# Patient Record
Sex: Male | Born: 1945 | Race: White | Hispanic: No | Marital: Married | Smoking: Former smoker
Health system: Southern US, Community
[De-identification: ages and names within clinical notes are randomized; demographics above are authoritative.]

## PROBLEM LIST (undated history)

## (undated) DIAGNOSIS — I350 Nonrheumatic aortic (valve) stenosis: Secondary | ICD-10-CM

## (undated) DIAGNOSIS — N400 Enlarged prostate without lower urinary tract symptoms: Secondary | ICD-10-CM

## (undated) DIAGNOSIS — C439 Malignant melanoma of skin, unspecified: Secondary | ICD-10-CM

## (undated) DIAGNOSIS — T7840XA Allergy, unspecified, initial encounter: Secondary | ICD-10-CM

## (undated) DIAGNOSIS — C61 Malignant neoplasm of prostate: Secondary | ICD-10-CM

## (undated) DIAGNOSIS — R011 Cardiac murmur, unspecified: Secondary | ICD-10-CM

## (undated) DIAGNOSIS — K5790 Diverticulosis of intestine, part unspecified, without perforation or abscess without bleeding: Secondary | ICD-10-CM

## (undated) DIAGNOSIS — E785 Hyperlipidemia, unspecified: Secondary | ICD-10-CM

## (undated) DIAGNOSIS — L400 Psoriasis vulgaris: Secondary | ICD-10-CM

## (undated) DIAGNOSIS — C3490 Malignant neoplasm of unspecified part of unspecified bronchus or lung: Secondary | ICD-10-CM

## (undated) DIAGNOSIS — C4491 Basal cell carcinoma of skin, unspecified: Secondary | ICD-10-CM

## (undated) DIAGNOSIS — I779 Disorder of arteries and arterioles, unspecified: Secondary | ICD-10-CM

## (undated) DIAGNOSIS — I10 Essential (primary) hypertension: Secondary | ICD-10-CM

## (undated) HISTORY — DX: Nonrheumatic aortic (valve) stenosis: I35.0

## (undated) HISTORY — DX: Basal cell carcinoma of skin, unspecified: C44.91

## (undated) HISTORY — DX: Diverticulosis of intestine, part unspecified, without perforation or abscess without bleeding: K57.90

## (undated) HISTORY — DX: Allergy, unspecified, initial encounter: T78.40XA

## (undated) HISTORY — DX: Hyperlipidemia, unspecified: E78.5

## (undated) HISTORY — DX: Cardiac murmur, unspecified: R01.1

## (undated) HISTORY — DX: Malignant neoplasm of unspecified part of unspecified bronchus or lung: C34.90

## (undated) HISTORY — DX: Essential (primary) hypertension: I10

## (undated) HISTORY — DX: Malignant melanoma of skin, unspecified: C43.9

## (undated) HISTORY — PX: TONSILLECTOMY AND ADENOIDECTOMY: SUR1326

## (undated) HISTORY — DX: Disorder of arteries and arterioles, unspecified: I77.9

## (undated) HISTORY — DX: Malignant neoplasm of prostate: C61

## (undated) HISTORY — PX: CYSTOSCOPY WITH INSERTION OF UROLIFT: SHX6678

---

## 1987-01-12 DIAGNOSIS — T7840XA Allergy, unspecified, initial encounter: Secondary | ICD-10-CM

## 1987-01-12 DIAGNOSIS — C3490 Malignant neoplasm of unspecified part of unspecified bronchus or lung: Secondary | ICD-10-CM

## 1987-01-12 DIAGNOSIS — E785 Hyperlipidemia, unspecified: Secondary | ICD-10-CM

## 1987-01-12 HISTORY — DX: Hyperlipidemia, unspecified: E78.5

## 1987-01-12 HISTORY — PX: OTHER SURGICAL HISTORY: SHX169

## 1987-01-12 HISTORY — DX: Malignant neoplasm of unspecified part of unspecified bronchus or lung: C34.90

## 1987-01-12 HISTORY — DX: Allergy, unspecified, initial encounter: T78.40XA

## 1995-05-18 ENCOUNTER — Encounter: Payer: Self-pay | Admitting: Family Medicine

## 1995-05-18 LAB — CONVERTED CEMR LAB
RBC count: 4.4 10*6/uL
WBC, blood: 3.7 10*3/uL

## 1996-05-29 ENCOUNTER — Encounter: Payer: Self-pay | Admitting: Family Medicine

## 1996-05-29 LAB — CONVERTED CEMR LAB
RBC count: 4.29 10*6/uL
WBC, blood: 5.3 10*3/uL

## 1998-03-20 ENCOUNTER — Encounter: Payer: Self-pay | Admitting: Family Medicine

## 1998-03-20 LAB — CONVERTED CEMR LAB
Blood Glucose, Fasting: 84 mg/dL
PSA: 1.5 ng/mL
RBC count: 4.3 10*6/uL
TSH: 3.2 microintl units/mL
WBC, blood: 4.9 10*3/uL

## 1999-11-13 ENCOUNTER — Encounter: Payer: Self-pay | Admitting: Family Medicine

## 1999-11-13 LAB — CONVERTED CEMR LAB
Blood Glucose, Fasting: 96 mg/dL
PSA: 1 ng/mL
RBC count: 4.26 10*6/uL
TSH: 1.23 microintl units/mL
WBC, blood: 4.4 10*3/uL

## 2000-02-12 DIAGNOSIS — K5790 Diverticulosis of intestine, part unspecified, without perforation or abscess without bleeding: Secondary | ICD-10-CM

## 2000-02-12 HISTORY — DX: Diverticulosis of intestine, part unspecified, without perforation or abscess without bleeding: K57.90

## 2000-02-18 ENCOUNTER — Encounter (INDEPENDENT_AMBULATORY_CARE_PROVIDER_SITE_OTHER): Payer: Self-pay | Admitting: Specialist

## 2000-02-18 ENCOUNTER — Other Ambulatory Visit: Admission: RE | Admit: 2000-02-18 | Discharge: 2000-02-18 | Payer: Self-pay | Admitting: Gastroenterology

## 2000-02-18 HISTORY — PX: COLONOSCOPY: SHX174

## 2002-06-08 ENCOUNTER — Encounter: Payer: Self-pay | Admitting: Family Medicine

## 2002-06-08 LAB — CONVERTED CEMR LAB
Blood Glucose, Fasting: 98 mg/dL
PSA: 3.7 ng/mL
RBC count: 4.31 10*6/uL
TSH: 1.11 microintl units/mL
WBC, blood: 4.8 10*3/uL

## 2002-06-12 LAB — FECAL OCCULT BLOOD, GUAIAC: Fecal Occult Blood: NEGATIVE

## 2003-06-20 HISTORY — PX: OTHER SURGICAL HISTORY: SHX169

## 2004-02-13 ENCOUNTER — Ambulatory Visit: Payer: Self-pay | Admitting: Family Medicine

## 2004-02-13 LAB — CONVERTED CEMR LAB
Blood Glucose, Fasting: 108 mg/dL
PSA: 1.98 ng/mL
RBC count: 4.34 10*6/uL
TSH: 1.59 microintl units/mL
WBC, blood: 5.2 10*3/uL

## 2004-02-19 ENCOUNTER — Ambulatory Visit: Payer: Self-pay | Admitting: Family Medicine

## 2005-02-05 ENCOUNTER — Ambulatory Visit: Payer: Self-pay | Admitting: Family Medicine

## 2005-09-21 ENCOUNTER — Ambulatory Visit: Payer: Self-pay | Admitting: Family Medicine

## 2005-10-09 HISTORY — PX: MRI: SHX5353

## 2005-11-03 ENCOUNTER — Ambulatory Visit (HOSPITAL_COMMUNITY): Admission: RE | Admit: 2005-11-03 | Discharge: 2005-11-04 | Payer: Self-pay | Admitting: Neurosurgery

## 2005-11-03 HISTORY — PX: OTHER SURGICAL HISTORY: SHX169

## 2006-02-07 ENCOUNTER — Ambulatory Visit: Payer: Self-pay | Admitting: Family Medicine

## 2006-02-07 LAB — CONVERTED CEMR LAB
ALT: 23 units/L (ref 0–40)
AST: 29 units/L (ref 0–37)
Albumin: 3.9 g/dL (ref 3.5–5.2)
Alkaline Phosphatase: 45 units/L (ref 39–117)
BUN: 11 mg/dL (ref 6–23)
Blood Glucose, Fasting: 106 mg/dL
CO2: 29 meq/L (ref 19–32)
Calcium: 9.3 mg/dL (ref 8.4–10.5)
Chloride: 104 meq/L (ref 96–112)
Cholesterol: 203 mg/dL (ref 0–200)
Creatinine, Ser: 0.9 mg/dL (ref 0.4–1.5)
Creatinine,U: 89.7 mg/dL
Direct LDL: 120.1 mg/dL
GFR calc Af Amer: 111 mL/min
GFR calc non Af Amer: 91 mL/min
Glucose, Bld: 106 mg/dL — ABNORMAL HIGH (ref 70–99)
HCT: 40.6 % (ref 39.0–52.0)
HDL: 54.5 mg/dL (ref >39.0)
Hemoglobin: 13.9 g/dL (ref 13.0–17.0)
MCHC: 34.2 g/dL (ref 30.0–36.0)
MCV: 95.6 fL (ref 78.0–100.0)
Microalb Creat Ratio: 15.6 mg/g (ref 0.0–30.0)
Microalb, Ur: 1.4 mg/dL (ref 0.0–1.9)
PSA: 2.13 ng/mL (ref 0.10–4.00)
Platelets: 276 10*3/uL (ref 150–400)
Potassium: 4 meq/L (ref 3.5–5.1)
RBC count: 4.24 10*6/uL
RBC: 4.24 M/uL (ref 4.22–5.81)
RDW: 12.8 % (ref 11.5–14.6)
Sodium: 139 meq/L (ref 135–145)
TSH: 1.64 microintl units/mL
TSH: 1.64 microintl units/mL (ref 0.35–5.50)
Total Bilirubin: 0.9 mg/dL (ref 0.3–1.2)
Total CHOL/HDL Ratio: 3.7
Total Protein: 6.7 g/dL (ref 6.0–8.3)
Triglycerides: 116 mg/dL (ref 0–149)
VLDL: 23 mg/dL (ref 0–40)
WBC, blood: 4.7 10*3/uL
WBC: 4.7 10*3/uL (ref 4.5–10.5)

## 2006-02-09 ENCOUNTER — Ambulatory Visit: Payer: Self-pay | Admitting: Family Medicine

## 2006-02-09 LAB — CONVERTED CEMR LAB: PSA: 2.13 ng/mL

## 2006-02-18 ENCOUNTER — Ambulatory Visit: Payer: Self-pay | Admitting: Gastroenterology

## 2006-03-03 ENCOUNTER — Ambulatory Visit: Payer: Self-pay | Admitting: Gastroenterology

## 2006-03-03 HISTORY — PX: COLONOSCOPY: SHX174

## 2006-03-03 LAB — HM COLONOSCOPY

## 2006-07-04 ENCOUNTER — Emergency Department: Payer: Self-pay | Admitting: General Practice

## 2007-03-03 IMAGING — CR DG LUMBAR SPINE 2-3V
1 series · 1 of 1 positions shown · non-contrast
Comparison: none

CLINICAL DATA: HNP, radiculopathy.  
 LUMBAR SPINE - 2 VIEW:
 One view of the lumbar spine shows a surgical probe posterior to the L5 disc space directed towards the L5 spinous process.

[view not recorded]
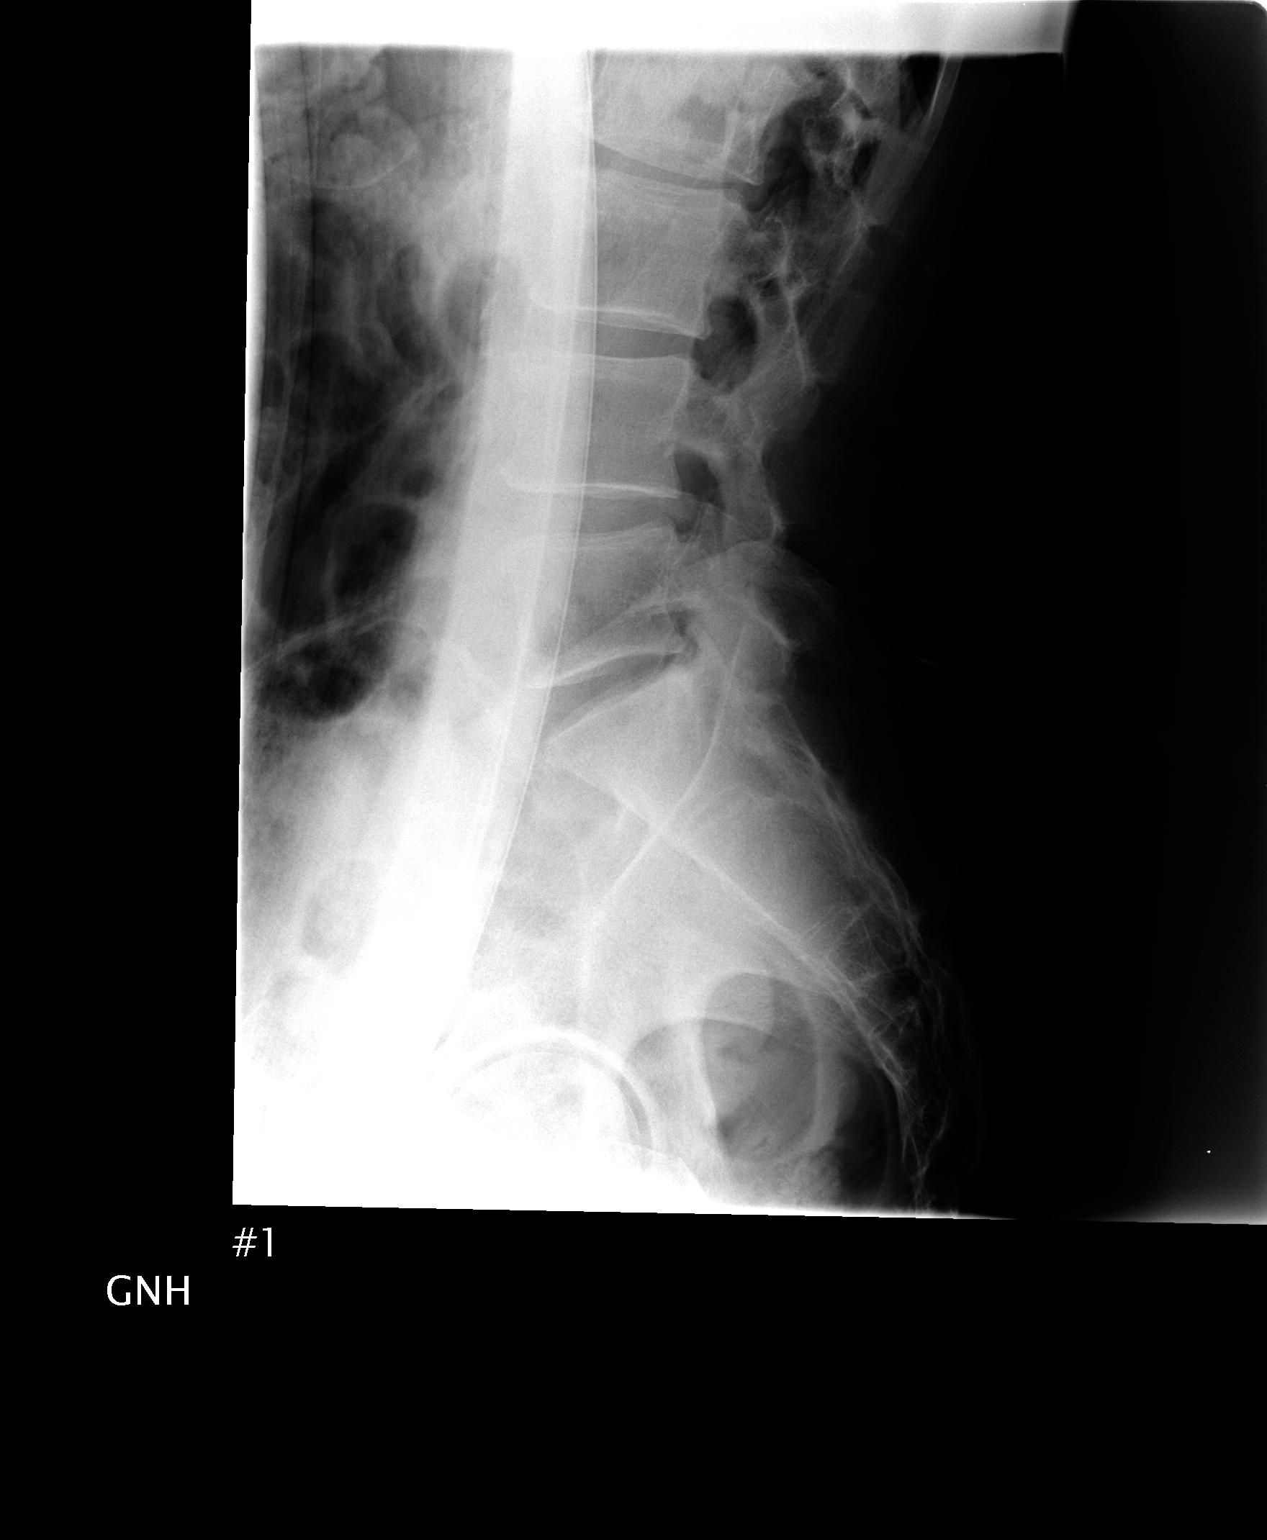

[1 of 1 positions shown; findings below may reference images not displayed]

IMPRESSION: Single view of lumbar spine obtained for lumbar disc space localization.

## 2007-03-03 IMAGING — CR DG CHEST 2V
2 series · 2 of 2 positions shown · non-contrast
Comparison: None.

CLINICAL DATA: Preop for OR.  Right upper lobectomy.
 CHEST - 2 VIEW:

[view not recorded (1 of 2)]
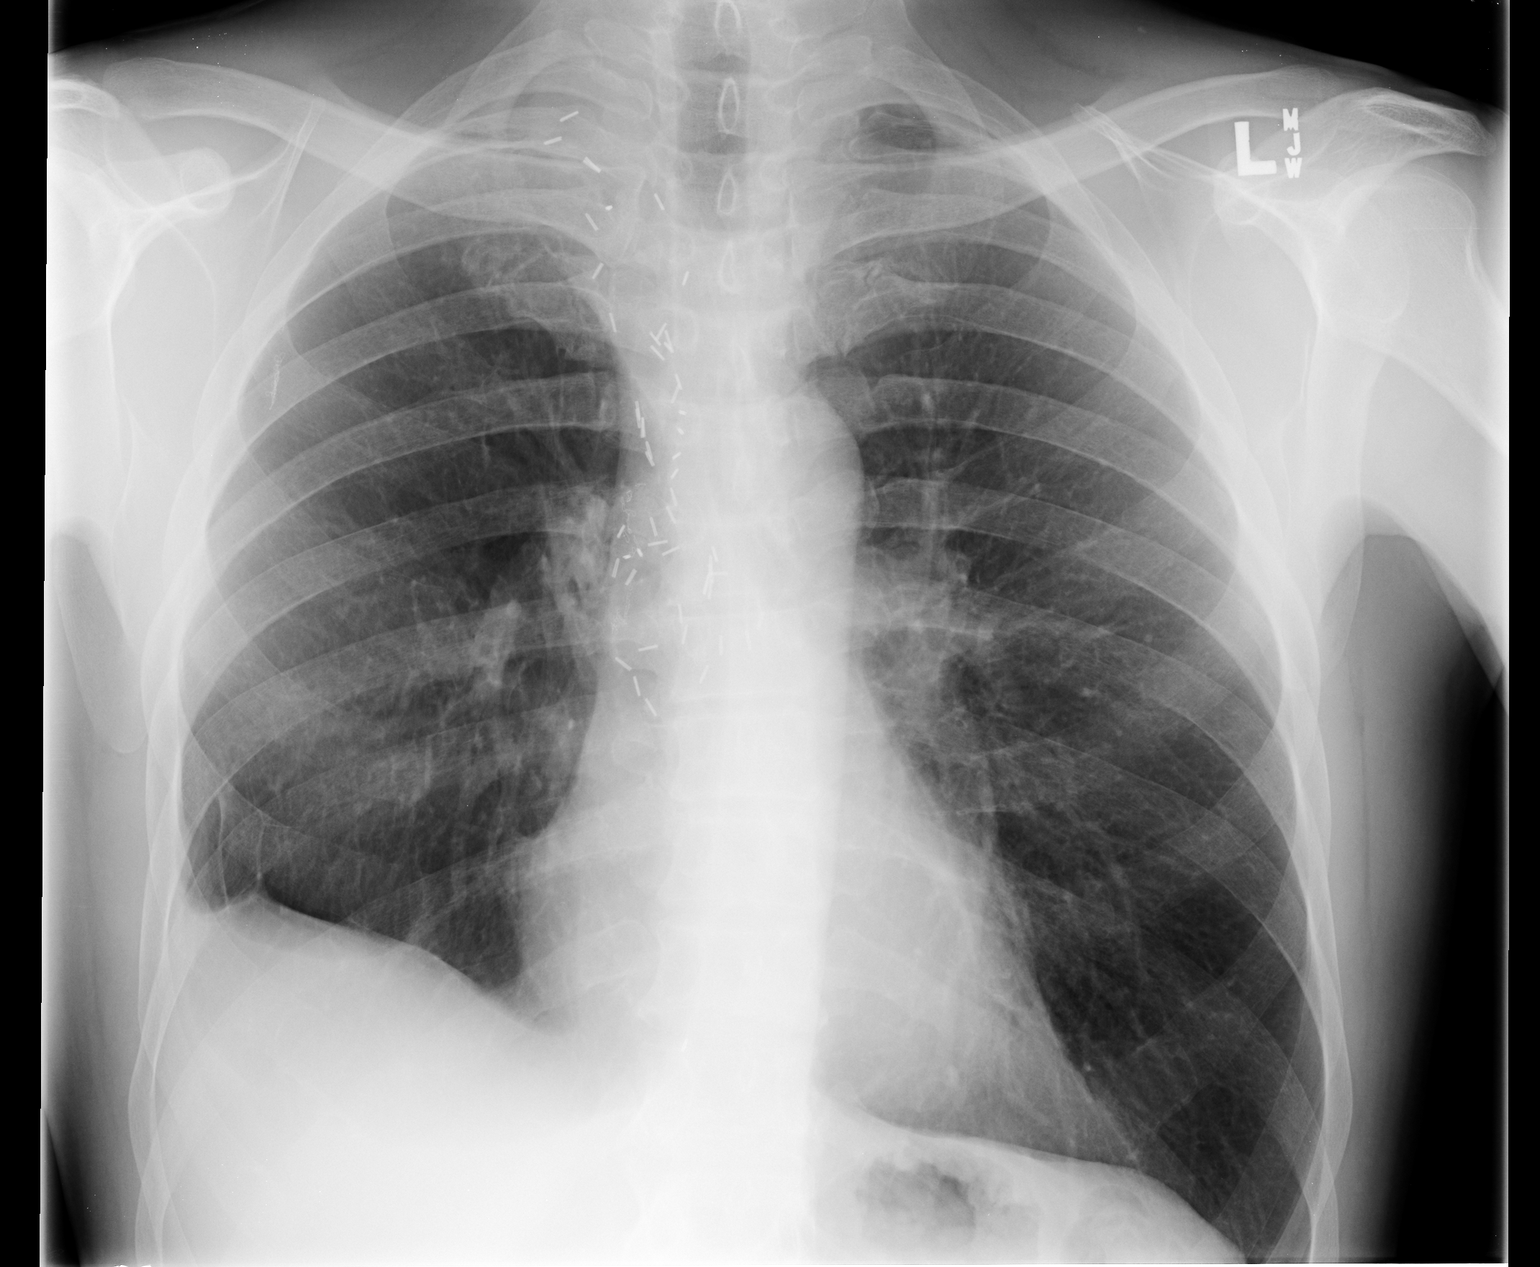

[view not recorded (2 of 2)]
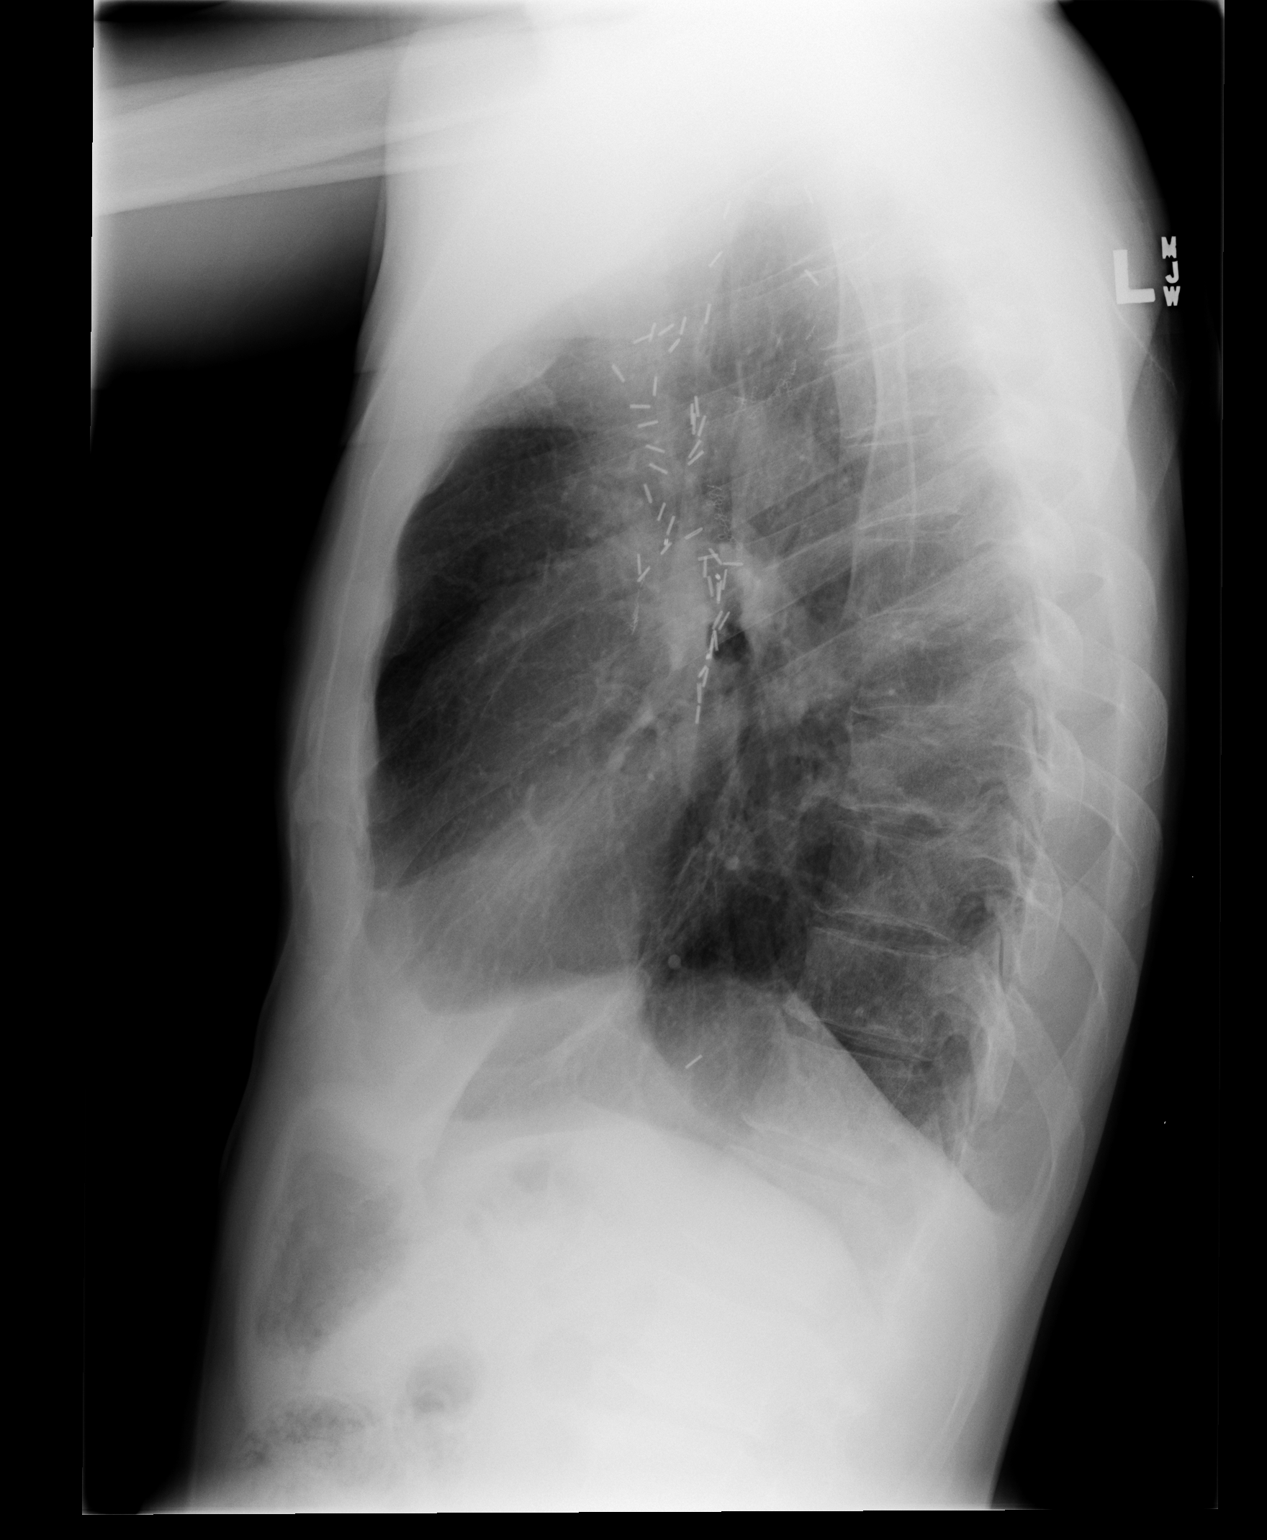

[2 of 2 positions shown; findings below may reference images not displayed]

FINDINGS: Postoperative changes and volume loss are seen in the right hemithorax.  Lungs are otherwise clear.  No pleural fluid.
IMPRESSION: No acute findings.

## 2007-04-19 ENCOUNTER — Ambulatory Visit: Payer: Self-pay | Admitting: Family Medicine

## 2007-04-25 ENCOUNTER — Encounter: Payer: Self-pay | Admitting: Family Medicine

## 2007-04-25 DIAGNOSIS — R3129 Other microscopic hematuria: Secondary | ICD-10-CM | POA: Insufficient documentation

## 2007-04-25 DIAGNOSIS — R7309 Other abnormal glucose: Secondary | ICD-10-CM | POA: Insufficient documentation

## 2007-04-25 DIAGNOSIS — N419 Inflammatory disease of prostate, unspecified: Secondary | ICD-10-CM | POA: Insufficient documentation

## 2007-04-25 DIAGNOSIS — R361 Hematospermia: Secondary | ICD-10-CM | POA: Insufficient documentation

## 2007-04-25 DIAGNOSIS — C3490 Malignant neoplasm of unspecified part of unspecified bronchus or lung: Secondary | ICD-10-CM | POA: Insufficient documentation

## 2007-04-25 DIAGNOSIS — K5730 Diverticulosis of large intestine without perforation or abscess without bleeding: Secondary | ICD-10-CM | POA: Insufficient documentation

## 2007-04-25 DIAGNOSIS — C439 Malignant melanoma of skin, unspecified: Secondary | ICD-10-CM | POA: Insufficient documentation

## 2007-04-25 DIAGNOSIS — E785 Hyperlipidemia, unspecified: Secondary | ICD-10-CM | POA: Insufficient documentation

## 2007-07-15 ENCOUNTER — Emergency Department: Payer: Self-pay | Admitting: Emergency Medicine

## 2007-11-01 IMAGING — CR DG CHEST 2V
1 series · 3 of 3 positions shown · non-contrast
Comparison: none

REASON FOR EXAM: fever
COMMENTS:

PROCEDURE:     DXR - DXR CHEST PA (OR AP) AND LATERAL  - July 04, 2006  [DATE]
RESULT:     Postoperative metallic clips are noted on the RIGHT. Pleural
fibrotic changes are present at the RIGHT base. No pneumonia is seen. No
lung mass is identified. Heart size is normal.

[Series 1: view not recorded · 0.17mm/px · 3 of 3 slices shown]
[im 1/3]
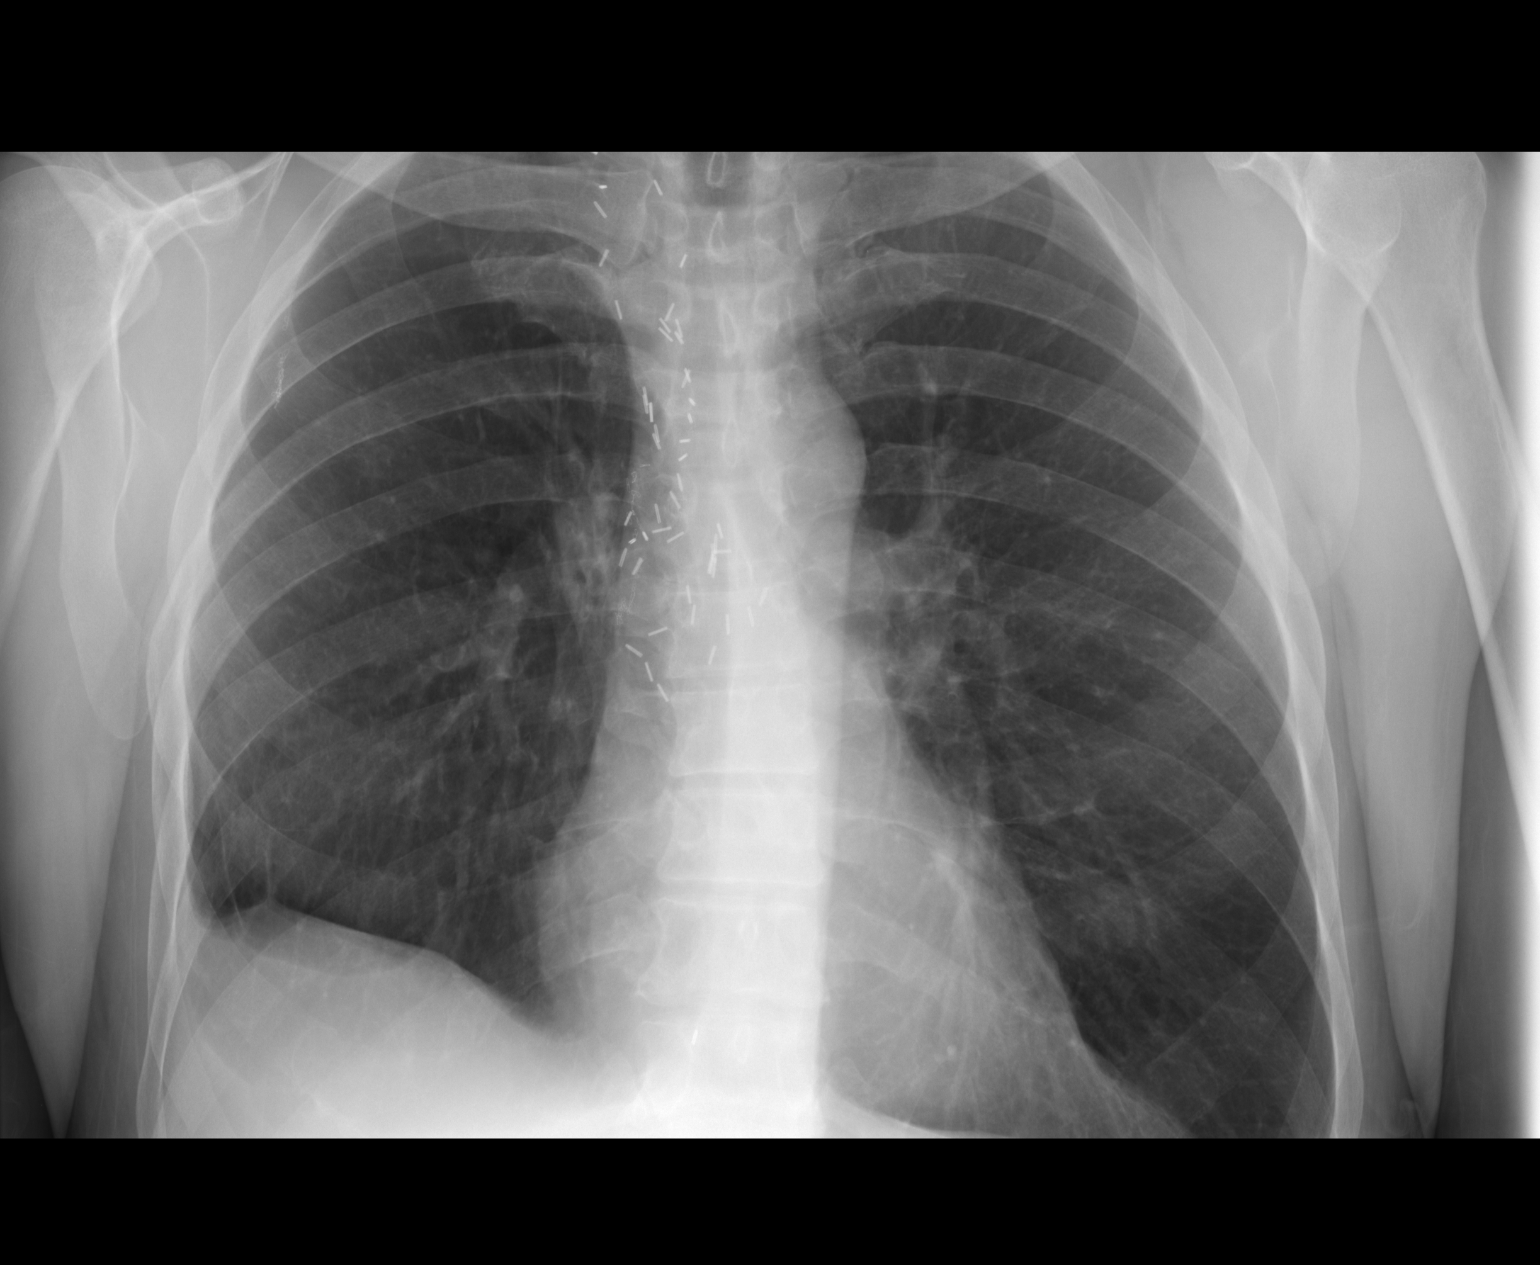
[im 2/3]
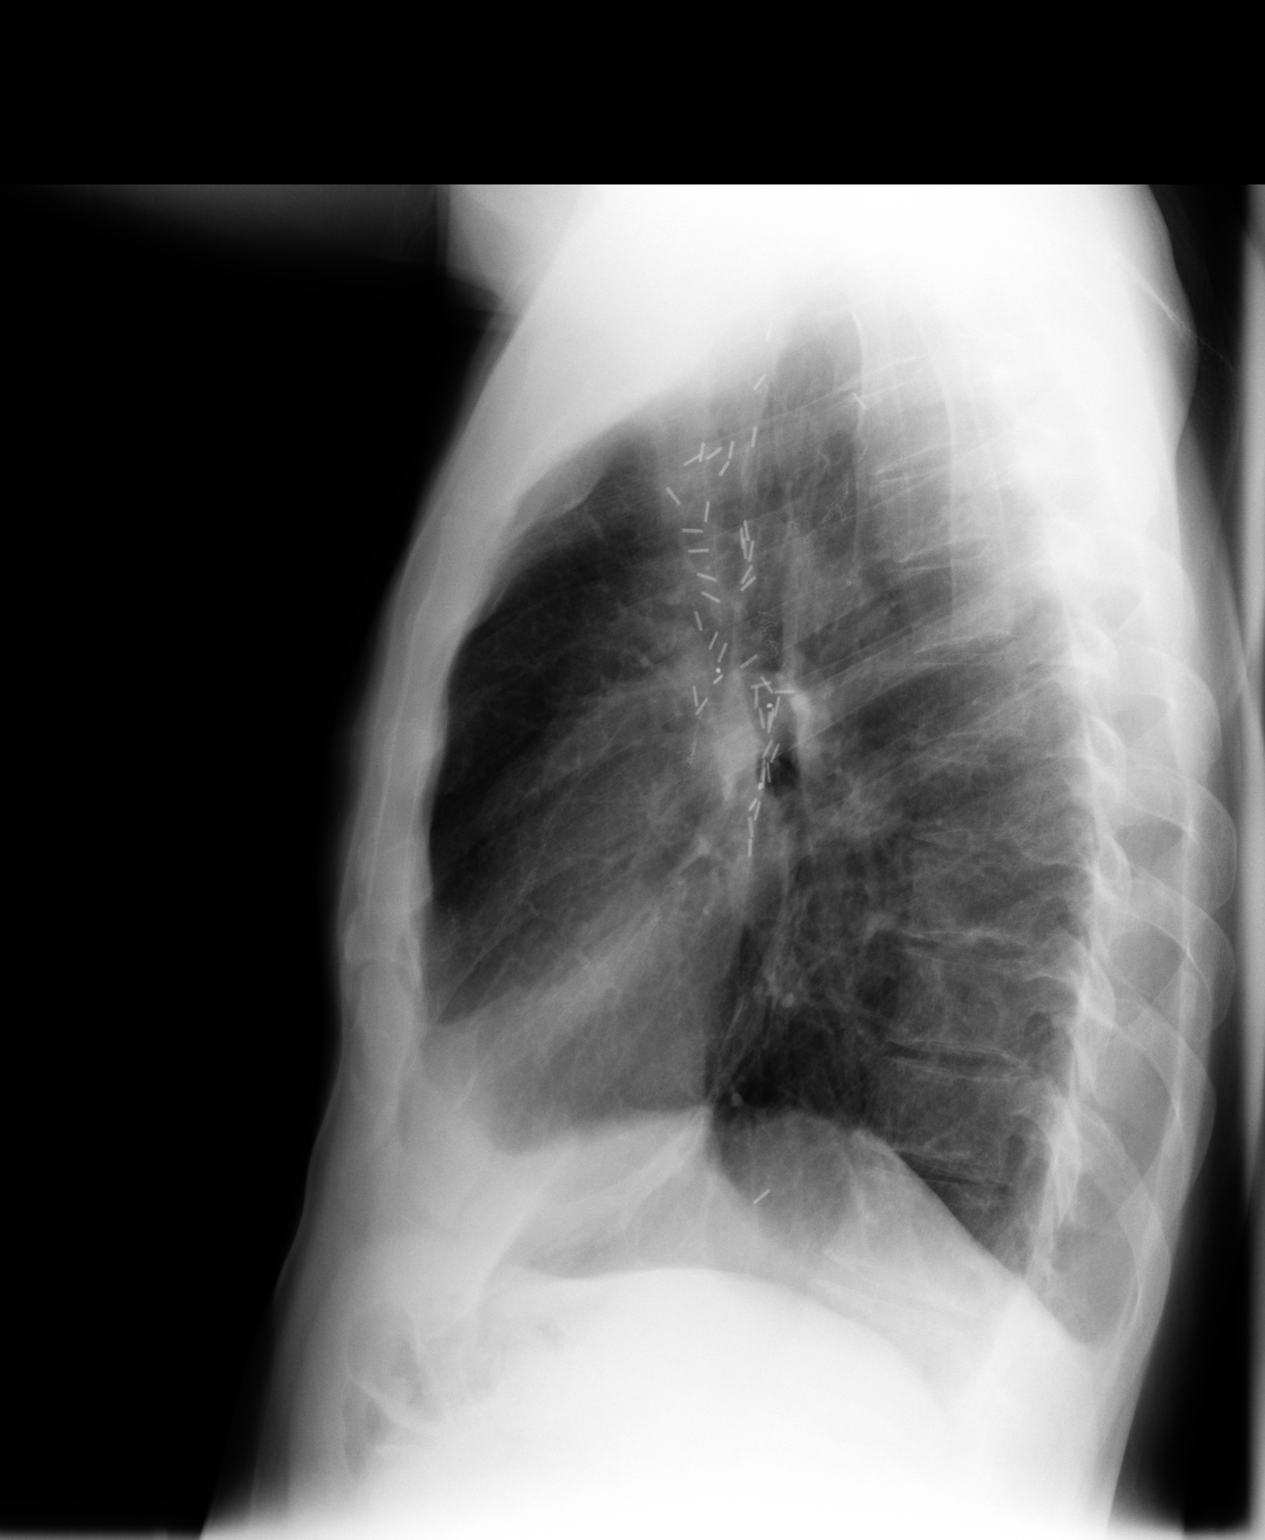
[im 3/3]
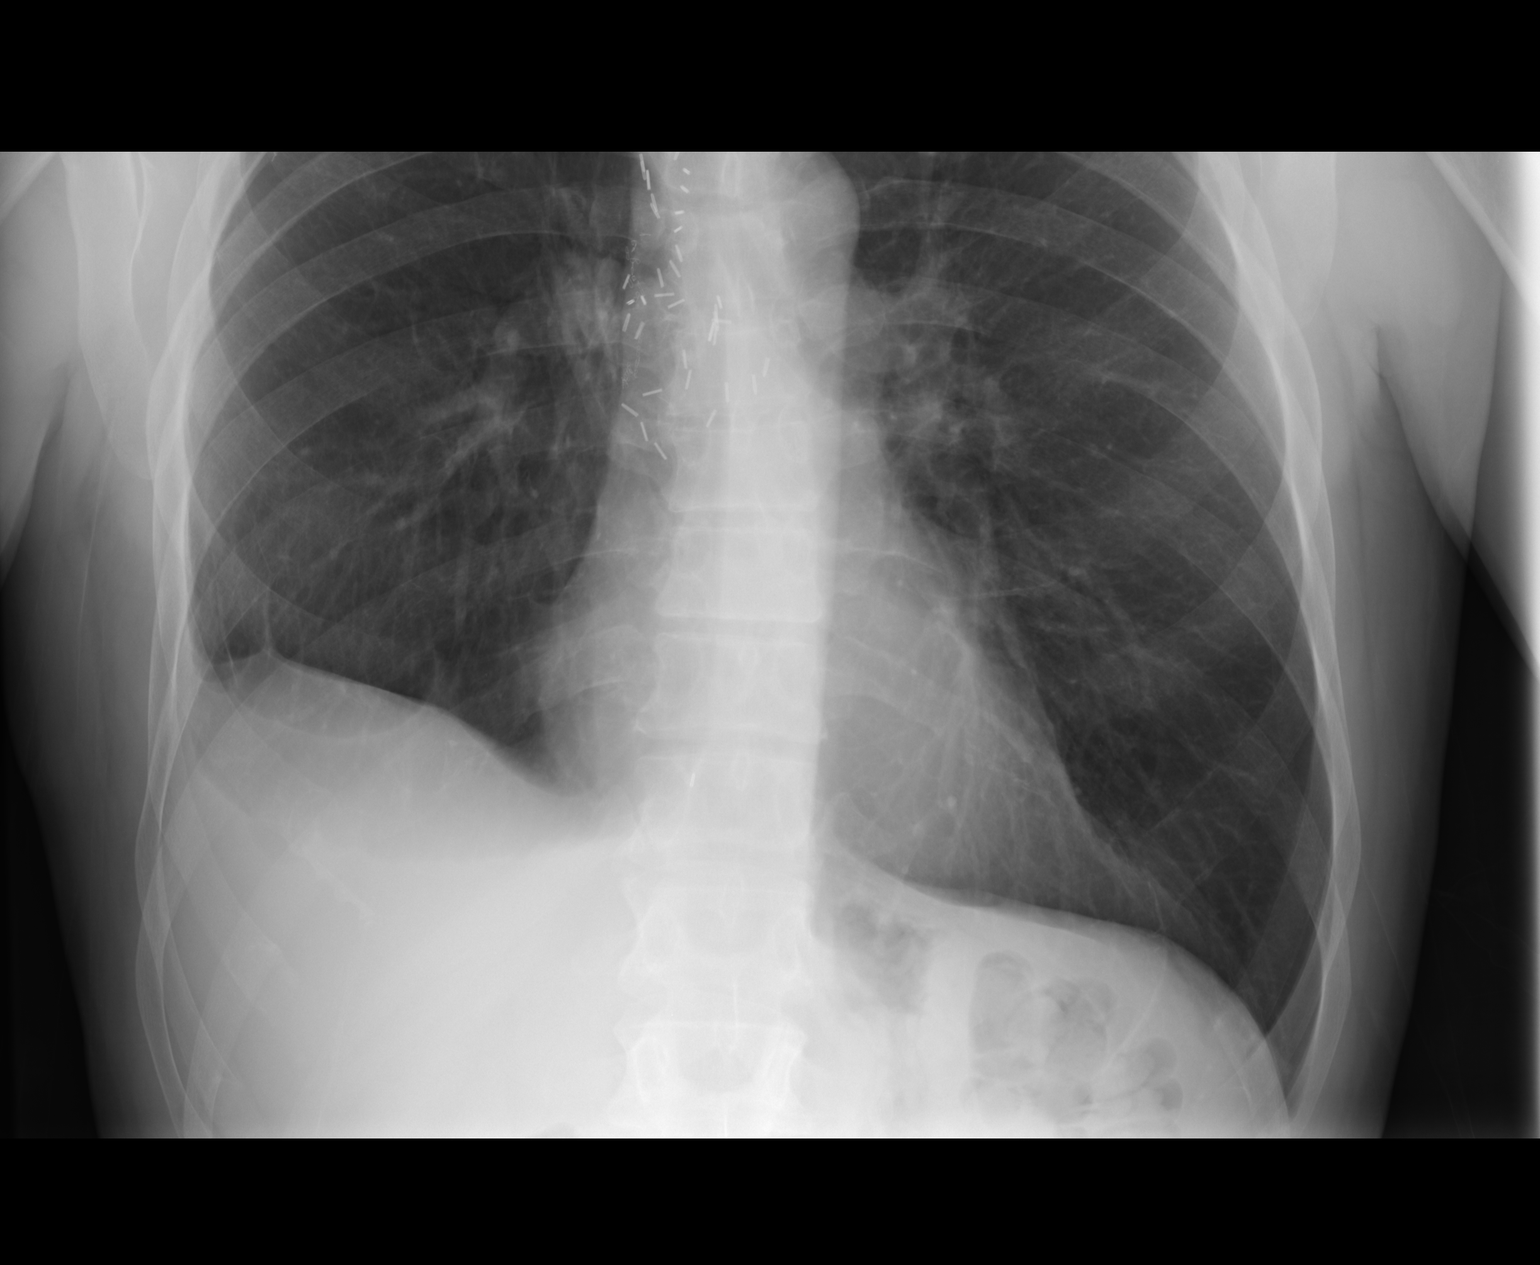

[3 of 3 positions shown; findings below may reference images not displayed]

IMPRESSION: 1.     No acute changes are identified.

## 2007-11-01 IMAGING — CT CT ABD-PELV W/O CM
1 of 2 series · 15 of 32 positions shown, 19 images · non-contrast
Comparison: none

REASON FOR EXAM: (1) pain; (2) pain
COMMENTS:

[Series 2: stone · axial · 0.72mm/px · z∈[-169,+266]mm · 15 of 163 slices shown, 19 images]
[im 12/163  soft-tissue]
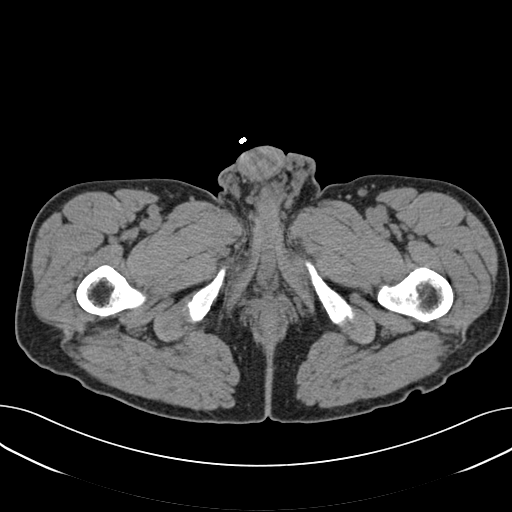
[im 12/163  bone]
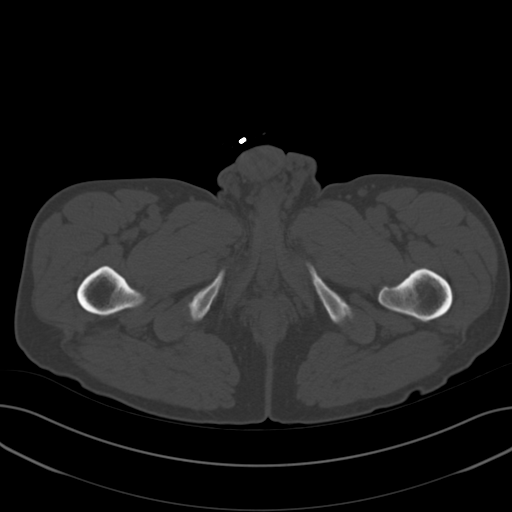
[im 24/163  soft-tissue]
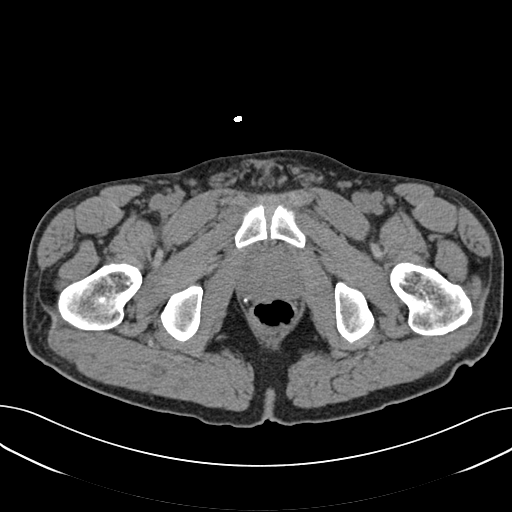
[im 35/163  soft-tissue]
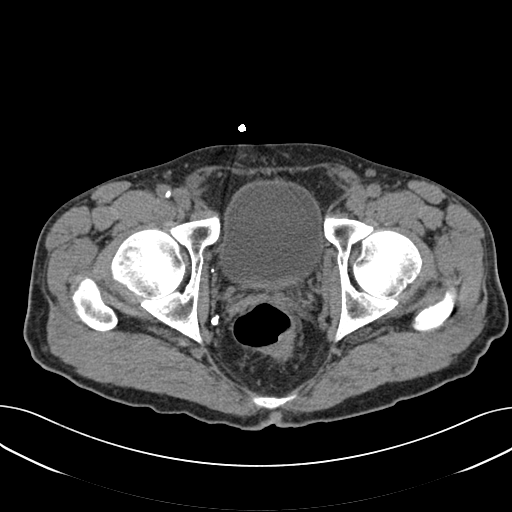
[im 47/163  soft-tissue]
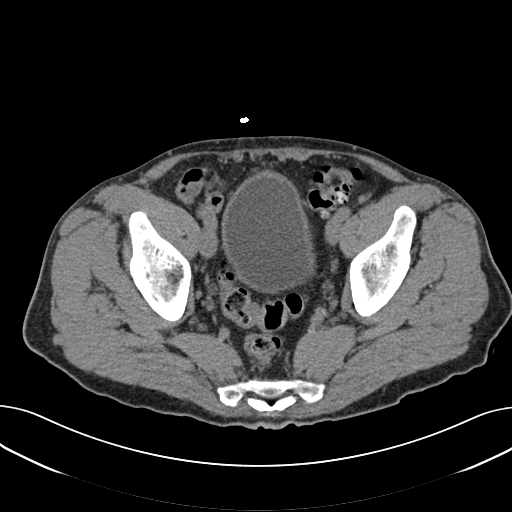
[im 58/163  soft-tissue]
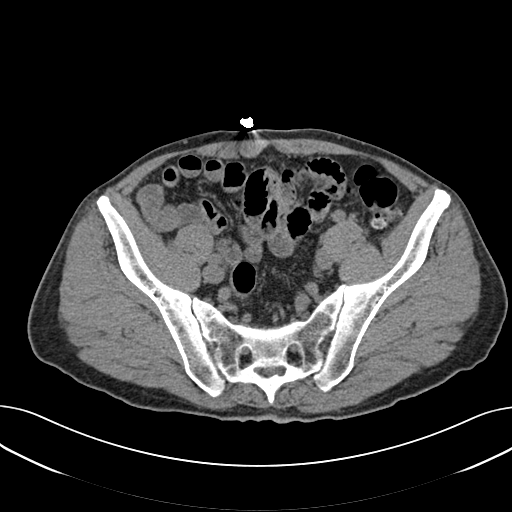
[im 70/163  soft-tissue]
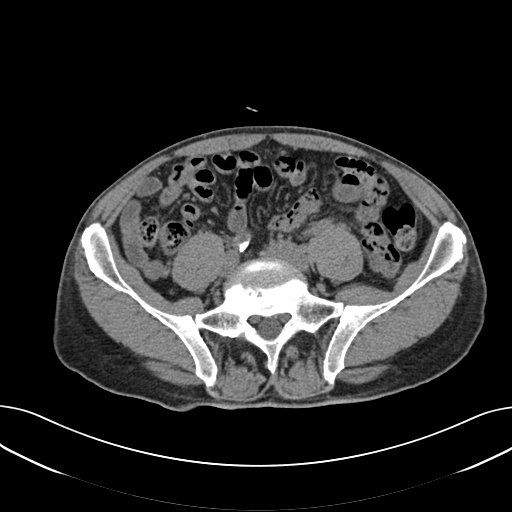
[im 82/163  soft-tissue]
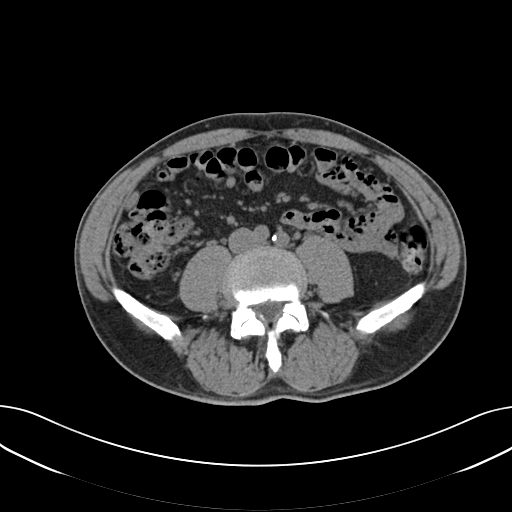
[im 93/163  soft-tissue]
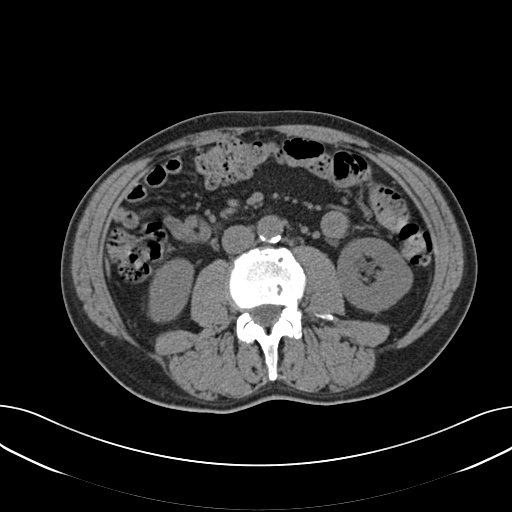
[im 105/163  soft-tissue]
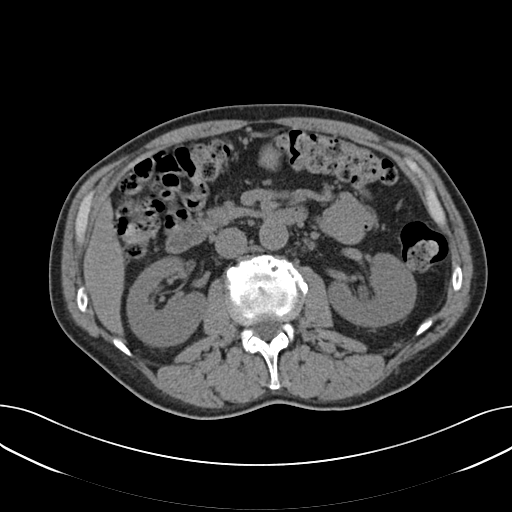
[im 105/163  bone]
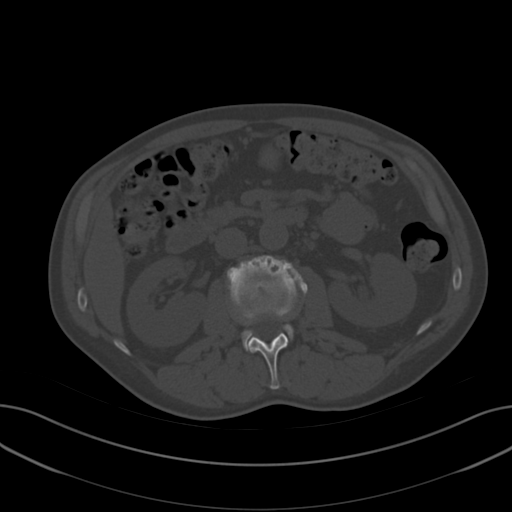
[im 116/163  soft-tissue]
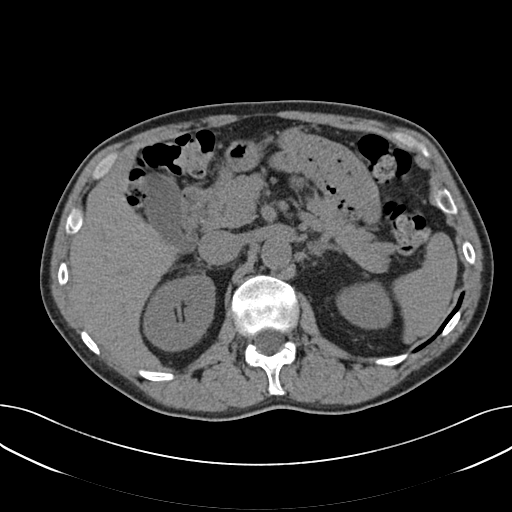
[im 128/163  soft-tissue]
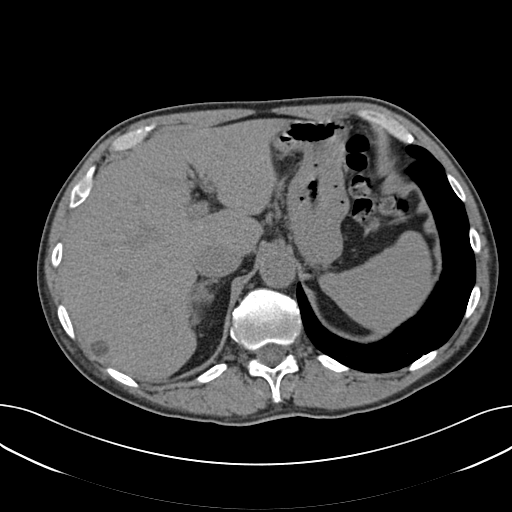
[im 139/163  soft-tissue]
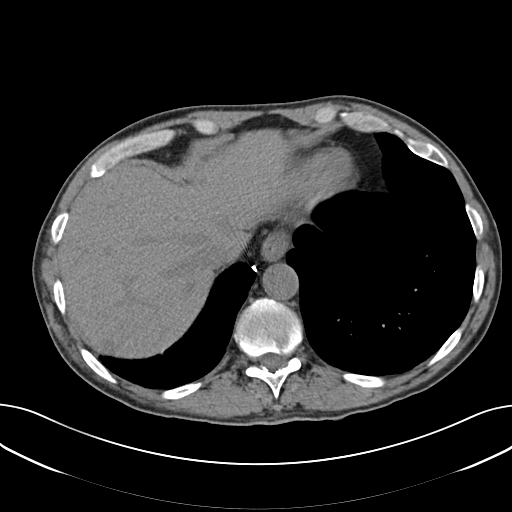
[im 139/163  lung]
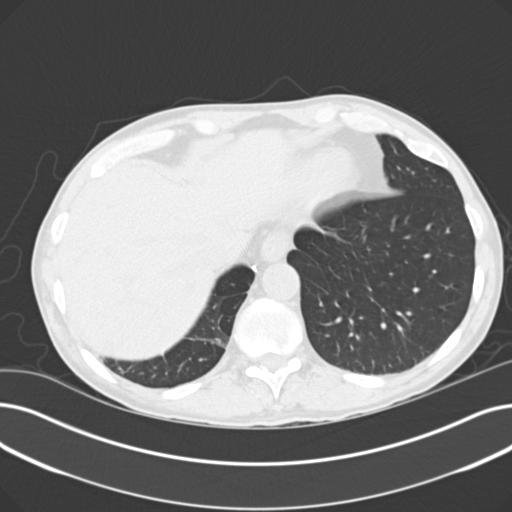
[im 145/163  lung]
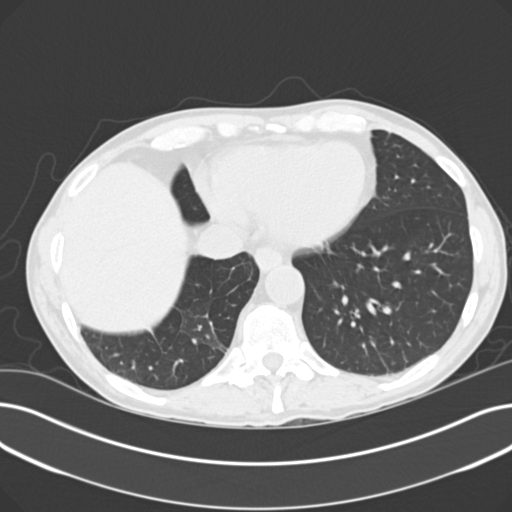
[im 151/163  soft-tissue]
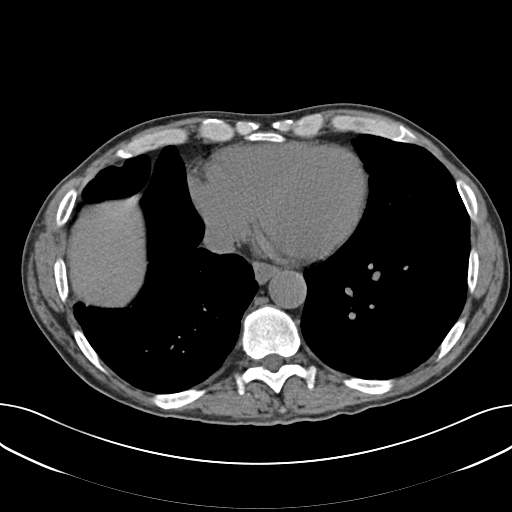
[im 151/163  lung]
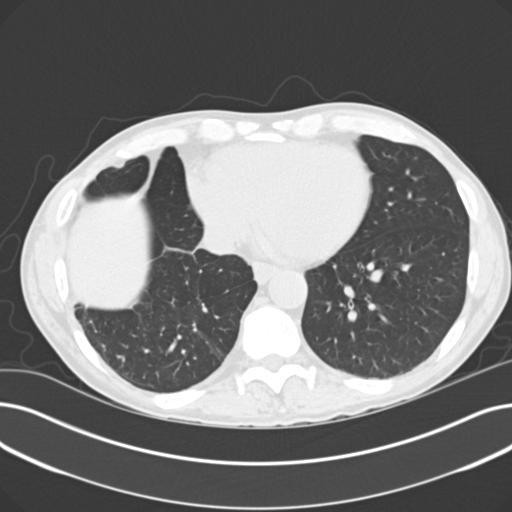
[im 157/163  lung]
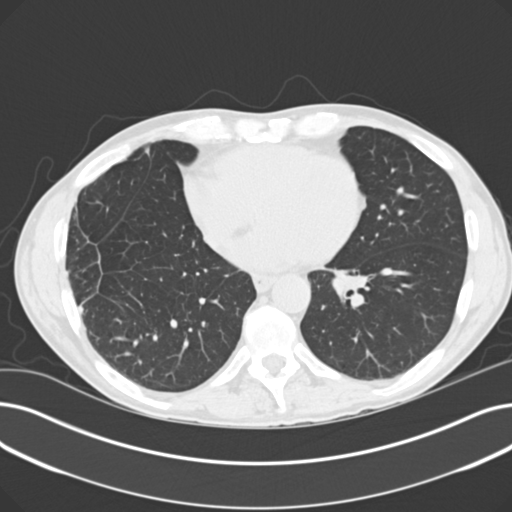

[15 of 32 positions shown; findings below may reference images not displayed]

PROCEDURE:     CT  - CT ABDOMEN AND PELVIS W[DATE]  [DATE]

RESULT:     The study was performed without IV contrast as requested
tailoring the patient for presence of possible urinary tract stones. The
patient has a history of lung malignancy and is complaining of fever and
chills and RIGHT flank discomfort.

Both RIGHT and LEFT kidneys are normal in density and contour. The
perinephric fat is also normal in appearance. There is no evidence of
obstruction and no calcified stones are identified. The liver, gallbladder,
spleen, pancreas, nondistended stomach, and adrenal glands exhibit no acute
abnormality. The caliber of the abdominal aorta is normal. The unopacified
loops of small and large bowel exhibit no acute abnormality. There is no
evidence of acute diverticulitis. The appendix is normal in caliber and
contains air. There is no free fluid in the pelvis. The partially distended
urinary bladder is normal in appearance. There is a moderate impression upon
the urinary bladder base by the enlarged prostate gland. The lung bases
exhibit no acute abnormality. The lumbar vertebral bodies are preserved in
height.
IMPRESSION: 1. There is no evidence of urinary tract obstruction or urinary tract stones
or inflammation.
2. There are no acute abnormalities noted of the abdominal viscera
elsewhere. There is hypodensity noted in the extreme lateral aspect of the
LEFT lobe of the liver and in the posterior aspect of the RIGHT lobe. The
findings likely reflect cysts with Hounsfield measurements ranging from 0 to
- 2 in these approximately 1.5 cm hypodensities.
3. I see no acute bowel abnormality and no evidence of acute abnormality of
the abdominal aorta.

Given the patient's history of malignancy, a followup contrast-enhanced CT
scan of the abdomen and pelvis may be of value.

A preliminary report was sent to the [HOSPITAL] the conclusion
of the study.

## 2008-07-02 ENCOUNTER — Ambulatory Visit: Payer: Self-pay | Admitting: Dermatology

## 2009-08-13 ENCOUNTER — Encounter (INDEPENDENT_AMBULATORY_CARE_PROVIDER_SITE_OTHER): Payer: Self-pay | Admitting: *Deleted

## 2009-10-30 IMAGING — CR DG CHEST 2V
1 series · 3 of 3 positions shown · non-contrast
Comparison: none

REASON FOR EXAM: X8X.UX Malingnant Melanoma of skin;
COMMENTS:

PROCEDURE:     DXR - DXR CHEST PA (OR AP) AND LATERAL  - July 02, 2008 [DATE]
RESULT:     Comparison: 07/04/2006

[Series 1: view not recorded · 0.17mm/px · 3 of 3 slices shown]
[im 1/3]
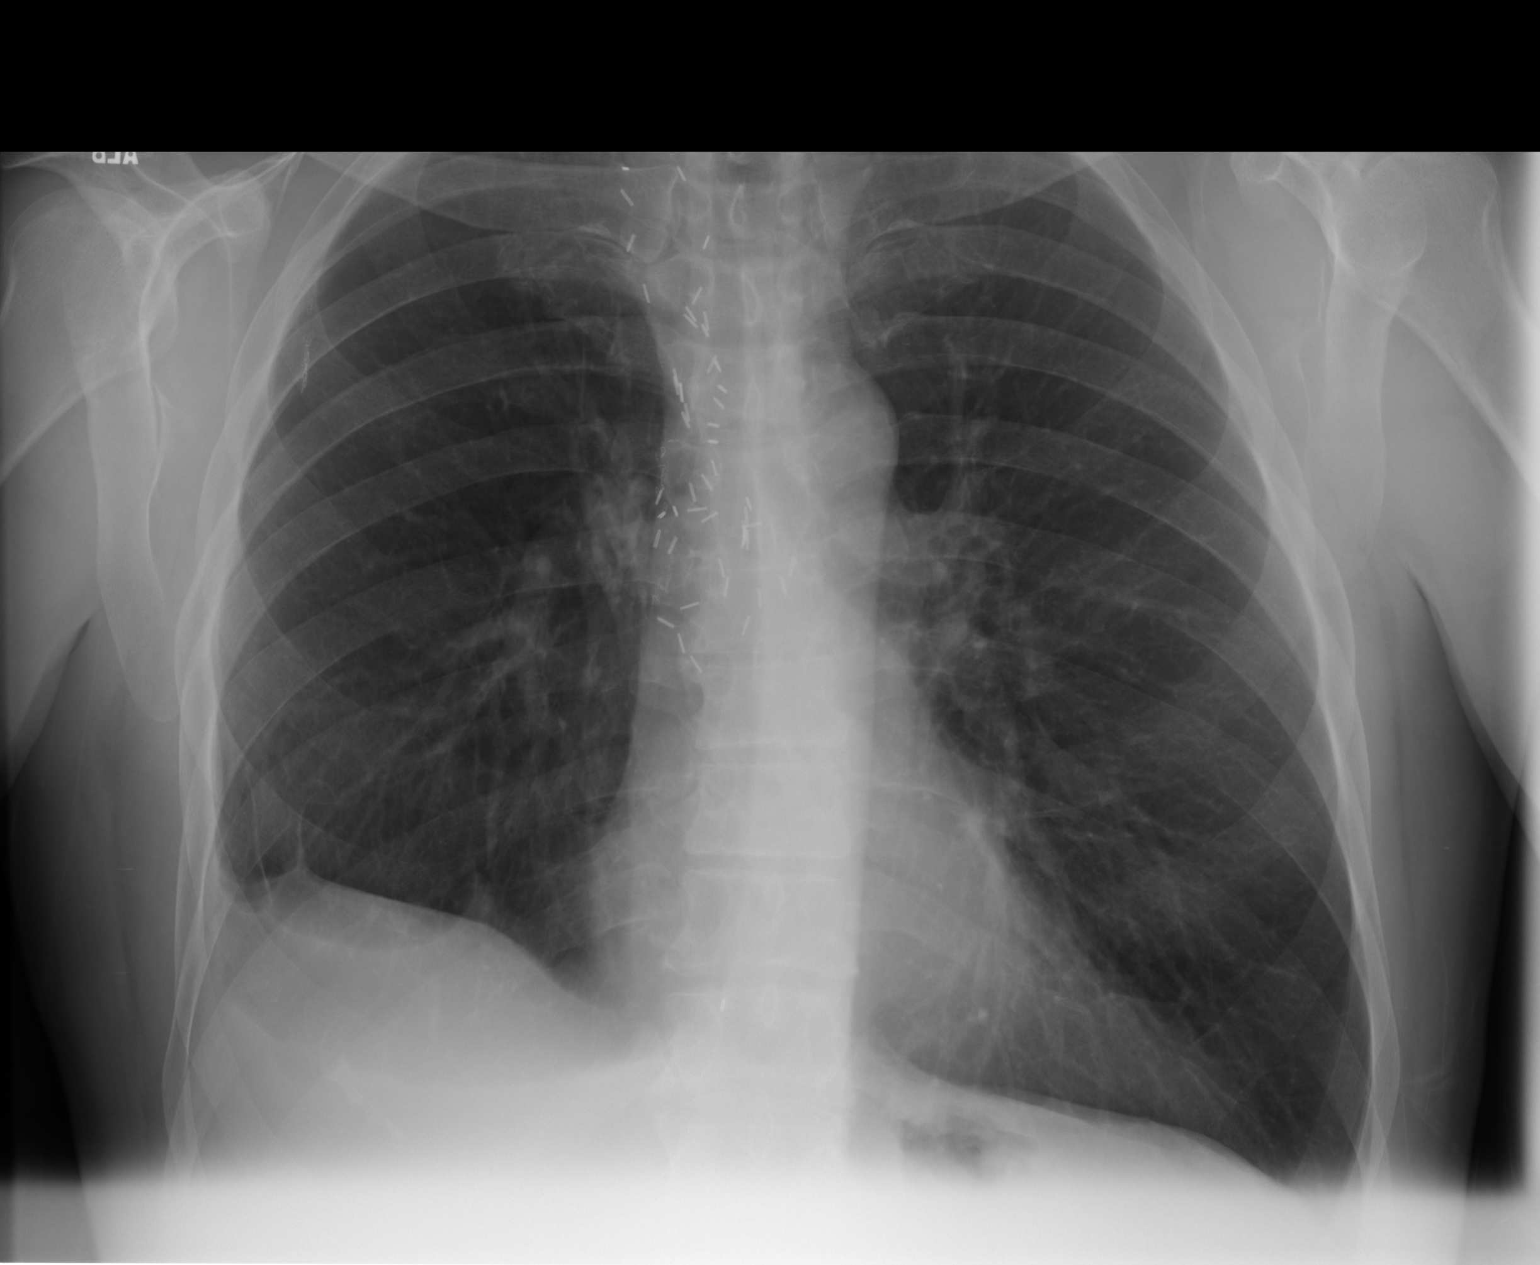
[im 2/3]
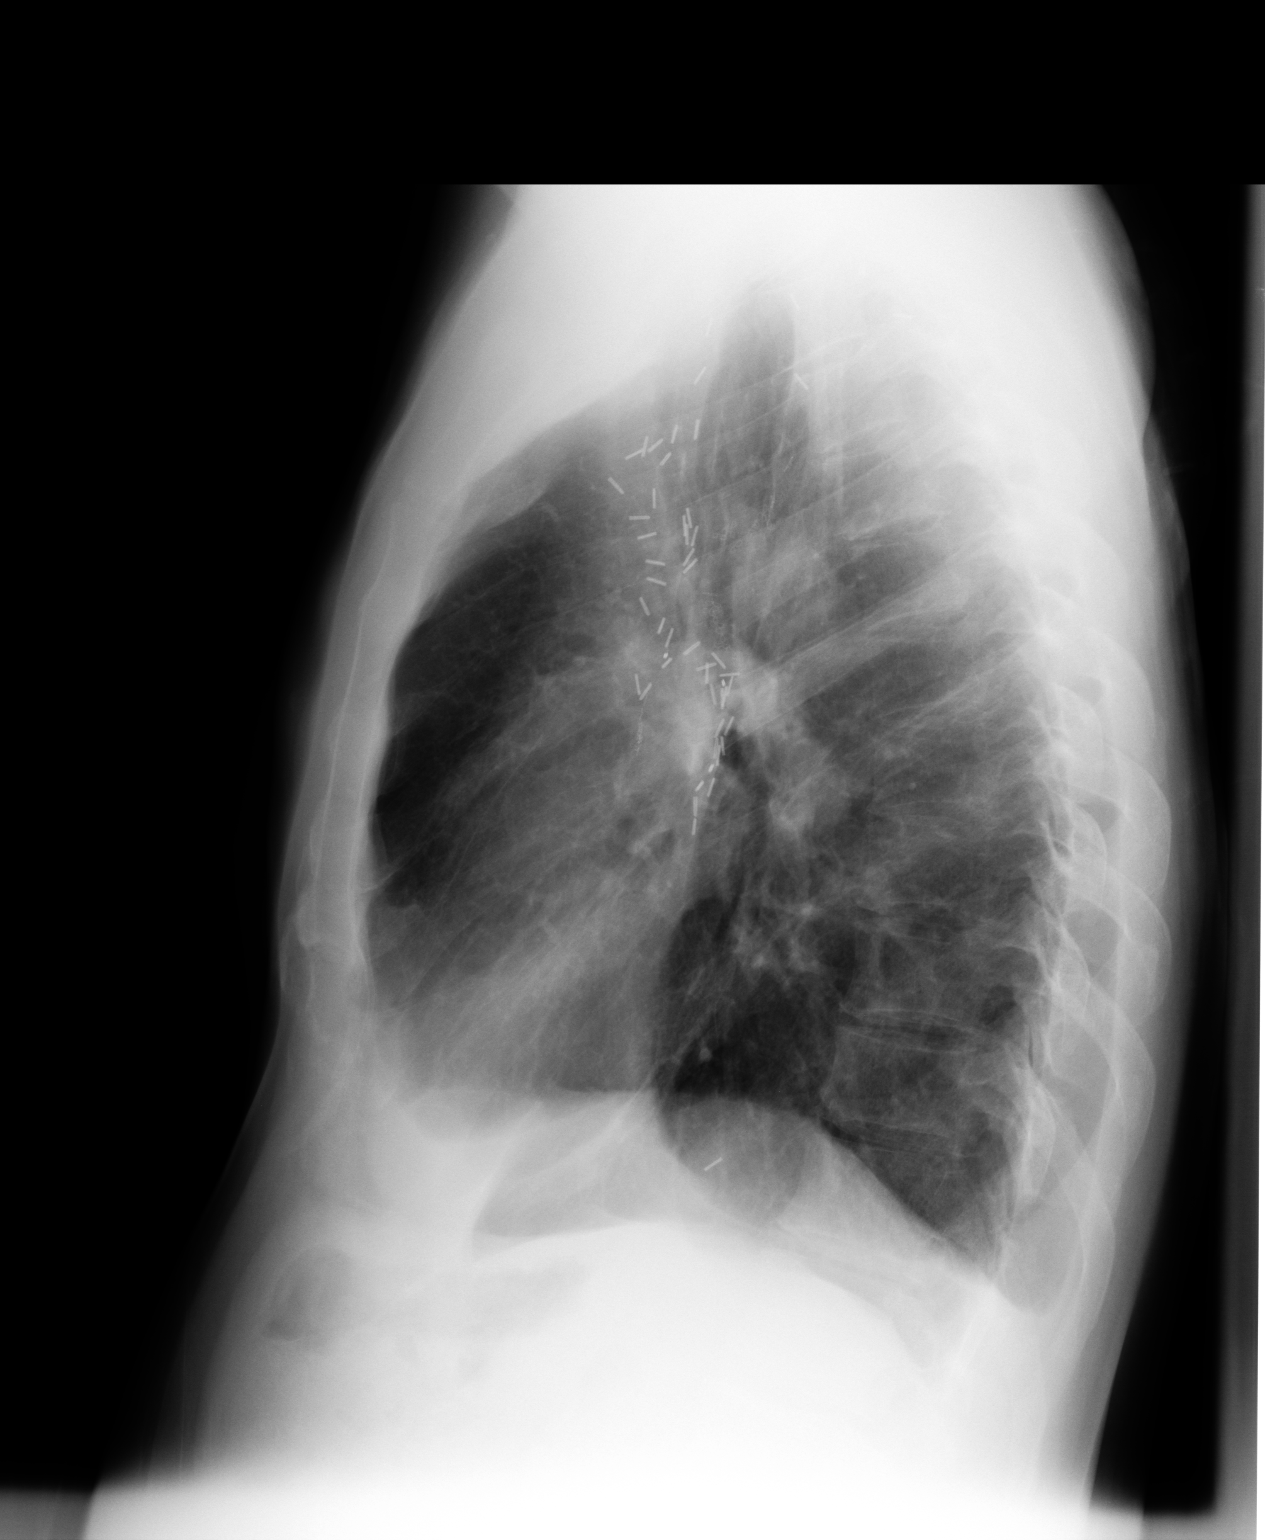
[im 3/3]
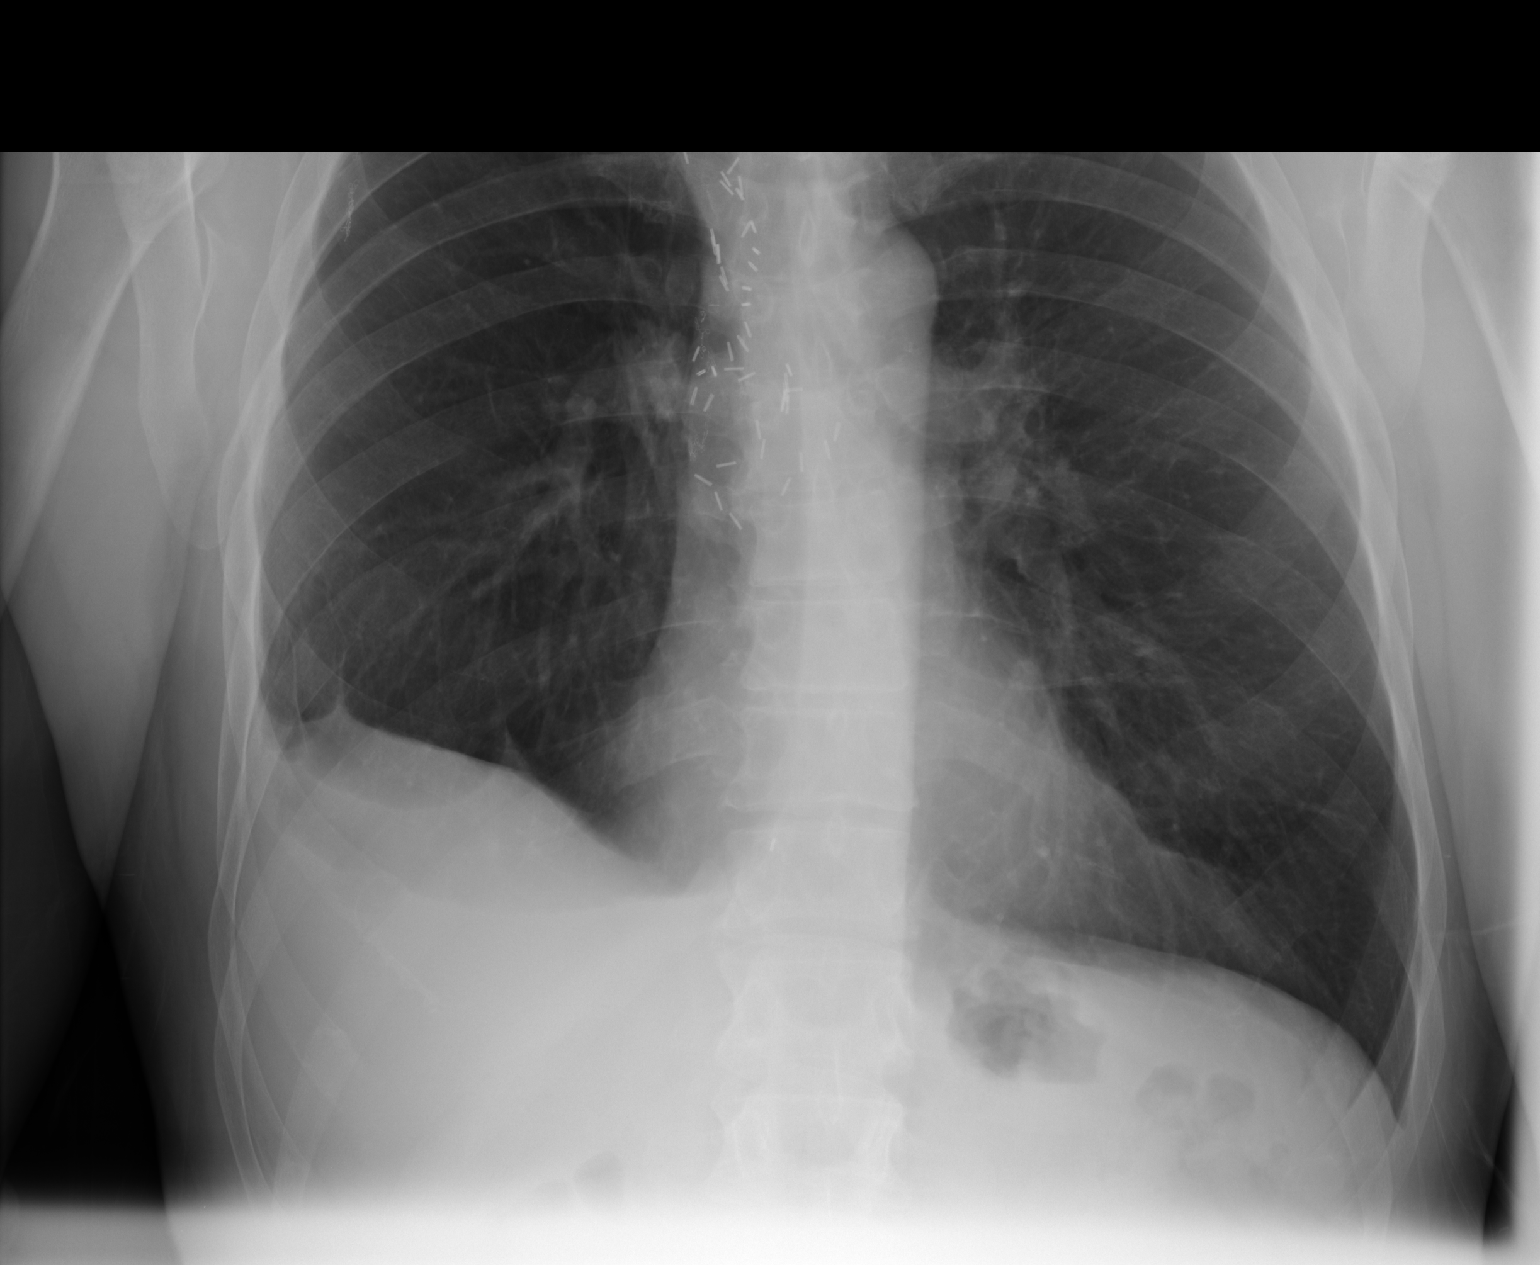

[3 of 3 positions shown; findings below may reference images not displayed]

FINDINGS: PA and lateral chest radiographs are provided. There fibrotic changes again
noted at the right lung base. There are multiple surgical clips along the
right mediastinum from prior surgery. There is no focal parenchymal opacity,
pleural effusion, or pneumothorax. The heart and mediastinum are
unremarkable. The osseous structures are unremarkable.
IMPRESSION: No acute disease of the chest.

## 2010-02-10 NOTE — Assessment & Plan Note (Signed)
Summary: LUNG/CLE   Vital Signs:  Patient Profile:   65 Years Old Male Weight:      152 pounds Temp:     95.8 degrees F oral Pulse rate:   56 / minute Pulse rhythm:   regular BP sitting:   140 / 80  (right arm) Cuff size:   regular  Vitals Entered By: Providence Crosby (April 19, 2007 10:57 AM)                 Chief Complaint:  check lung// on cipro.  History of Present Illness: In Oct/Nov got a cold which took a while to go away. Then had sxs recur a few weeks ago and had hard time getrting up sputum and started on some Cipro he had left over and has been on Cipro now 5 days with finally feeling like improving today. Was concerned because he had Lung Ca in the past with lobectomy and has done well but always has low threshold for 'wondering'. Denies fever or chills, headache except when coughing hard in paroxysms, min rhinitis and no ST.    Prior Medications Reviewed Using: Patient Recall  Current Allergies (reviewed today): ! * SULFA        Impression & Recommendations:  Problem # 1:  URI (ICD-465.9) Assessment: New Poss resolving bronchitis...mproving. Include Guaifenesin and finish the Cipro.   Patient Instructions: 1)  For congestion, Take anytime: 2)  GUAIFENESIN  600mg  by mouth AM and NOON  3)    [facility] or [facility] MUCOUS RELIEF EXPECTORANT (400 mg) 11/2 TABS   4)  Drink Lots of Fluids.                       ]

## 2010-02-10 NOTE — Letter (Signed)
Summary: Nadara Eaton letter  [place] [facility]  [address] [place], [place] [address]   Phone: [phone]  Fax: [phone]       08/13/2009 MRN: [id number]  JAYLAN HINOJOSA [address] [place], [place]  [address]  Dear Mr. [facility] [place], and [place] announce the retirement of Arta Silence, M.D., from full-time practice at the [facility] office effective July 10, 2009 and his plans of returning part-time.  It is important to Dr. Hetty Ely and to our practice that you understand that [facility] has seven physicians in our office for your health care needs.  We will continue to offer the same exceptional care that you have today.    Dr. Hetty Ely has spoken to many of you about his plans for retirement and returning part-time in the fall.   We will continue to work with you through the transition to schedule appointments for you in the office and meet the high standards that [facility] is committed to.   Again, it is with great pleasure that we share the news that Dr. Hetty Ely will return to [facility] in October of 2011 with a reduced schedule.    If you have any questions, or would like to request an appointment with one of our physicians, please call us at [phone] and press the option for Scheduling an appointment.  We take pleasure in providing you with excellent patient care and look forward to seeing you at your next office visit.  Our [facility] Physicians are:  Tillman Abide, M.D. Laurita Quint, M.D. Roxy Manns, M.D. Kerby Nora, M.D. Hannah Beat, M.D. Ruthe Mannan, M.D. We proudly welcomed Raechel Ache, M.D. and Eustaquio Boyden, M.D. to the practice in July/August 2011.  Sincerely,  [facility]

## 2010-05-29 NOTE — Op Note (Signed)
NAME:  Brett Mcguire, Brett Mcguire NO.:  [id number]   MEDICAL RECORD NO.:  [id number]          PATIENT TYPE:  AMB   LOCATION:  SDS                          FACILITY:  [facility]   PHYSICIAN:  Coletta Memos, M.D.     DATE OF BIRTH:  1945-08-24   DATE OF PROCEDURE:  11/03/2005  DATE OF DISCHARGE:                                 OPERATIVE REPORT   PREOPERATIVE DIAGNOSES:  1. Displaced disk, left L5-S1.  2. Left S1 radiculopathy.   POSTOPERATIVE DIAGNOSES:  1. Displaced disk, left L5-S1.  2. Left S1 radiculopathy.   PROCEDURE:  Left L5-s1 semihemilaminectomy and diskectomy with  microdissection.   COMPLICATIONS:  None.   SURGEON:  Coletta Memos, MD   ASSESSMENT:  Brett Shorts, MD   FINDINGS:  A large herniated disk at the disk space with some spondylitic  change.   ANESTHESIA:  General endotracheal.   COMPLICATIONS:  None.   INDICATIONS:  Mr. Brett Mcguire is a 65 year old who presents with severe pain in  the left lower extremity.  I therefore recommended and he agreed to undergo  operative decompression after conservative therapy was not helpful.   OPERATIVE NOTE:  Mr. Brett Mcguire was brought to the operating room, intubated,  and placed under a general anesthetic without difficulty.  He was positioned  onto the Wilson frame with all pressure points properly padded.  His back  was prepped and he was draped in a sterile fashion.  I infiltrated 10 mL  0.5% lidocaine, 1:200,000 epinephrine into the lumbar region.  I opened the  skin with a #10 blade and I took this down to the thoracolumbar fascia.  I  then exposed the lamina of L5-S1.  I confirmed my position with an  intraoperative x-ray.  I then performed a hemilaminectomy of L5 and removed  the ligamentum flavum.  I exposed the thecal sac and the left S1 nerve root.  I retracted that medially and encountered what was a very large disk  herniation.  I brought the microscope into the operative field and with Dr.  Earl Gala assistance and microdissection, we were able to then free the  nerve from the underlying disk.  I then opened the disk space with a #15  blade.  I removed the disk in a piecemeal fashion using pituitary rongeurs  and Kerrison punches.  After I decompressed the disk space, I interrogated  the foramina.  I felt  that both were free of compression and that the L5 and S1 nerve roots on the  left side egressed without difficulty.  I then closed in layered fashion  using Vicryl sutures.  Dermabond was used for a sterile dressing.  He  tolerated the procedure without difficulty.           ______________________________  Coletta Memos, M.D.     KC/MEDQ  D:  11/03/2005  T:  11/04/2005  Job:  [id number]

## 2010-07-06 ENCOUNTER — Ambulatory Visit: Payer: Self-pay | Admitting: Dermatology

## 2011-01-29 ENCOUNTER — Encounter: Payer: Self-pay | Admitting: Gastroenterology

## 2011-02-01 DIAGNOSIS — H10029 Other mucopurulent conjunctivitis, unspecified eye: Secondary | ICD-10-CM | POA: Diagnosis not present

## 2011-02-25 DIAGNOSIS — H902 Conductive hearing loss, unspecified: Secondary | ICD-10-CM | POA: Diagnosis not present

## 2011-02-25 DIAGNOSIS — H903 Sensorineural hearing loss, bilateral: Secondary | ICD-10-CM | POA: Diagnosis not present

## 2011-02-25 DIAGNOSIS — H6980 Other specified disorders of Eustachian tube, unspecified ear: Secondary | ICD-10-CM | POA: Diagnosis not present

## 2011-02-25 DIAGNOSIS — H9319 Tinnitus, unspecified ear: Secondary | ICD-10-CM | POA: Diagnosis not present

## 2011-02-25 DIAGNOSIS — H6120 Impacted cerumen, unspecified ear: Secondary | ICD-10-CM | POA: Diagnosis not present

## 2011-07-06 DIAGNOSIS — T148 Other injury of unspecified body region: Secondary | ICD-10-CM | POA: Diagnosis not present

## 2011-07-06 DIAGNOSIS — R21 Rash and other nonspecific skin eruption: Secondary | ICD-10-CM | POA: Diagnosis not present

## 2011-07-06 DIAGNOSIS — I10 Essential (primary) hypertension: Secondary | ICD-10-CM | POA: Diagnosis not present

## 2011-07-13 DIAGNOSIS — L820 Inflamed seborrheic keratosis: Secondary | ICD-10-CM | POA: Diagnosis not present

## 2011-07-13 DIAGNOSIS — Z85820 Personal history of malignant melanoma of skin: Secondary | ICD-10-CM | POA: Diagnosis not present

## 2011-07-13 DIAGNOSIS — D485 Neoplasm of uncertain behavior of skin: Secondary | ICD-10-CM | POA: Diagnosis not present

## 2011-07-13 DIAGNOSIS — D1800 Hemangioma unspecified site: Secondary | ICD-10-CM | POA: Diagnosis not present

## 2011-07-13 DIAGNOSIS — I679 Cerebrovascular disease, unspecified: Secondary | ICD-10-CM | POA: Diagnosis not present

## 2011-07-20 DIAGNOSIS — A6920 Lyme disease, unspecified: Secondary | ICD-10-CM | POA: Diagnosis not present

## 2011-07-20 DIAGNOSIS — I10 Essential (primary) hypertension: Secondary | ICD-10-CM | POA: Diagnosis not present

## 2011-08-25 DIAGNOSIS — I10 Essential (primary) hypertension: Secondary | ICD-10-CM | POA: Diagnosis not present

## 2011-08-25 DIAGNOSIS — A6920 Lyme disease, unspecified: Secondary | ICD-10-CM | POA: Diagnosis not present

## 2011-09-21 DIAGNOSIS — R197 Diarrhea, unspecified: Secondary | ICD-10-CM | POA: Diagnosis not present

## 2011-09-21 DIAGNOSIS — Z86010 Personal history of colonic polyps: Secondary | ICD-10-CM | POA: Diagnosis not present

## 2011-10-28 ENCOUNTER — Encounter: Payer: Self-pay | Admitting: Gastroenterology

## 2011-10-28 DIAGNOSIS — K5730 Diverticulosis of large intestine without perforation or abscess without bleeding: Secondary | ICD-10-CM | POA: Diagnosis not present

## 2011-10-28 DIAGNOSIS — Z86010 Personal history of colonic polyps: Secondary | ICD-10-CM | POA: Diagnosis not present

## 2011-10-28 DIAGNOSIS — K648 Other hemorrhoids: Secondary | ICD-10-CM | POA: Diagnosis not present

## 2011-10-28 DIAGNOSIS — R197 Diarrhea, unspecified: Secondary | ICD-10-CM | POA: Diagnosis not present

## 2011-11-01 DIAGNOSIS — J00 Acute nasopharyngitis [common cold]: Secondary | ICD-10-CM | POA: Diagnosis not present

## 2011-11-03 IMAGING — CR DG CHEST 2V
1 series · 3 of 3 positions shown · non-contrast
Comparison: none

REASON FOR EXAM: hx of malignant melanoma
COMMENTS:

[Series 1: view not recorded · 0.17mm/px · 3 of 3 slices shown]
[im 1/3]
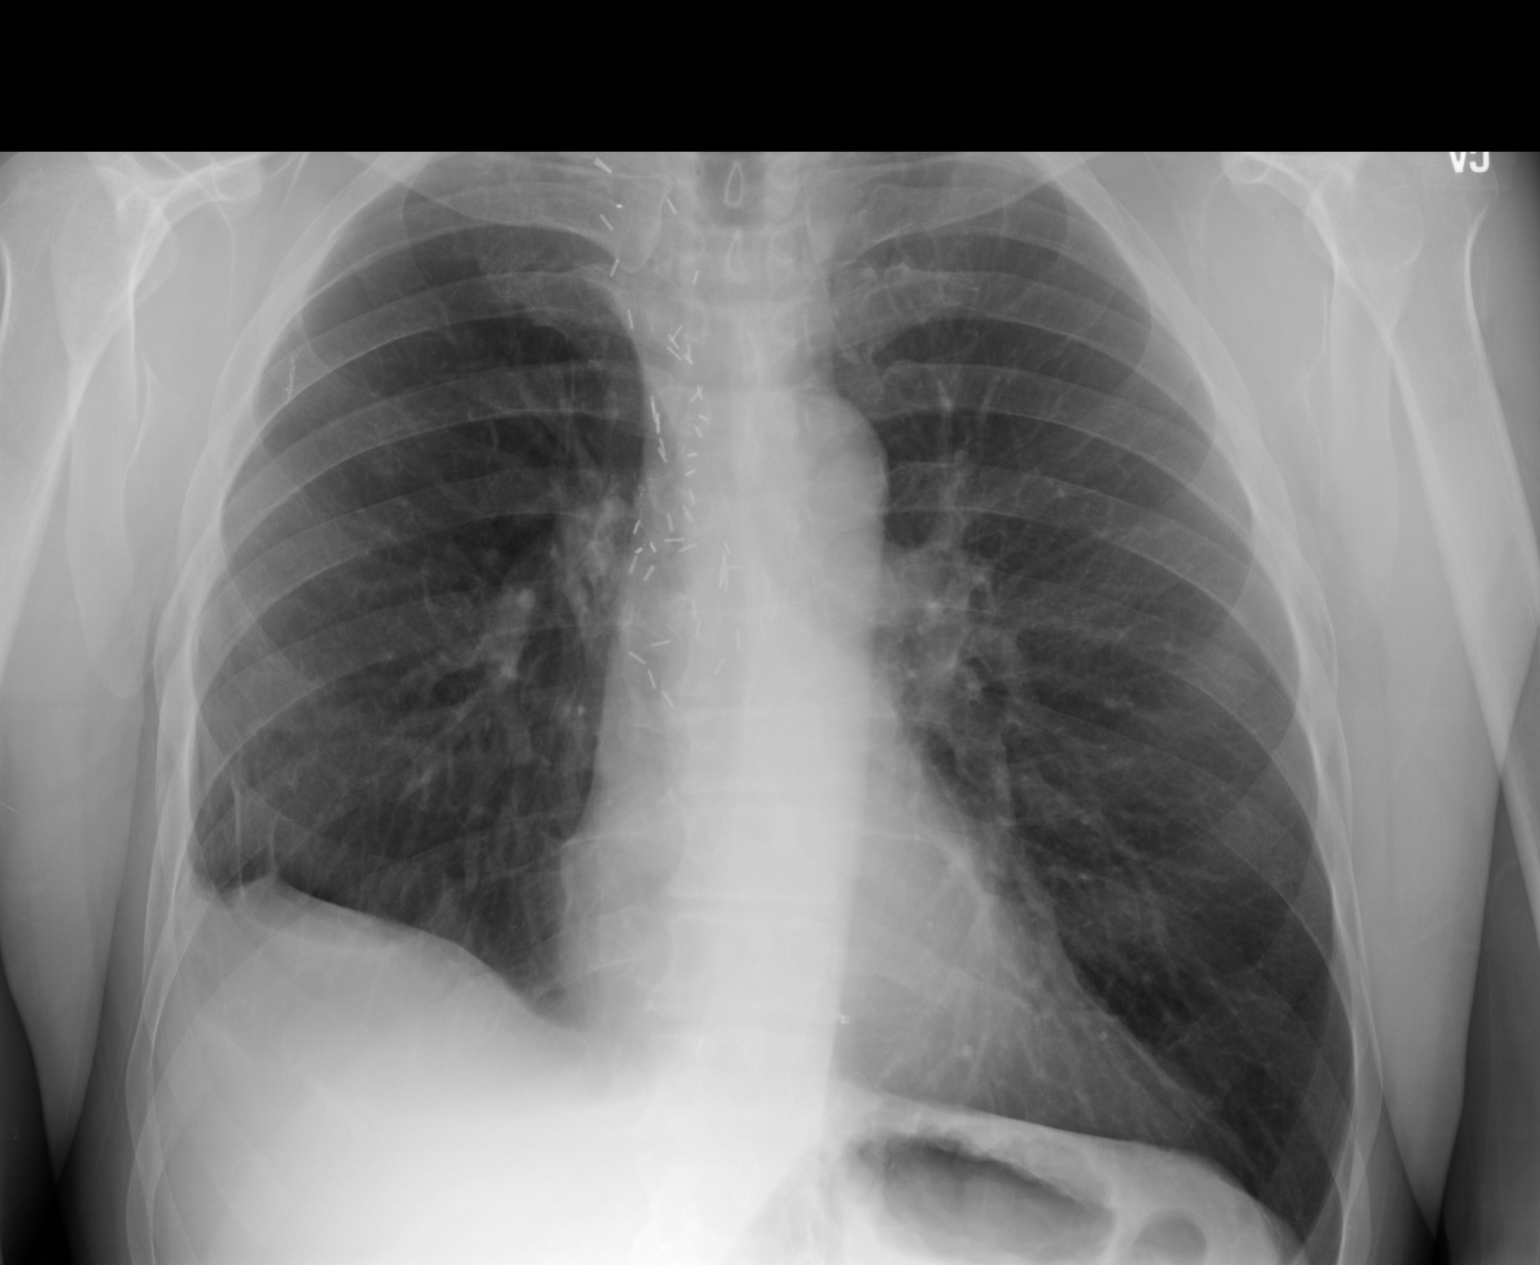
[im 2/3]
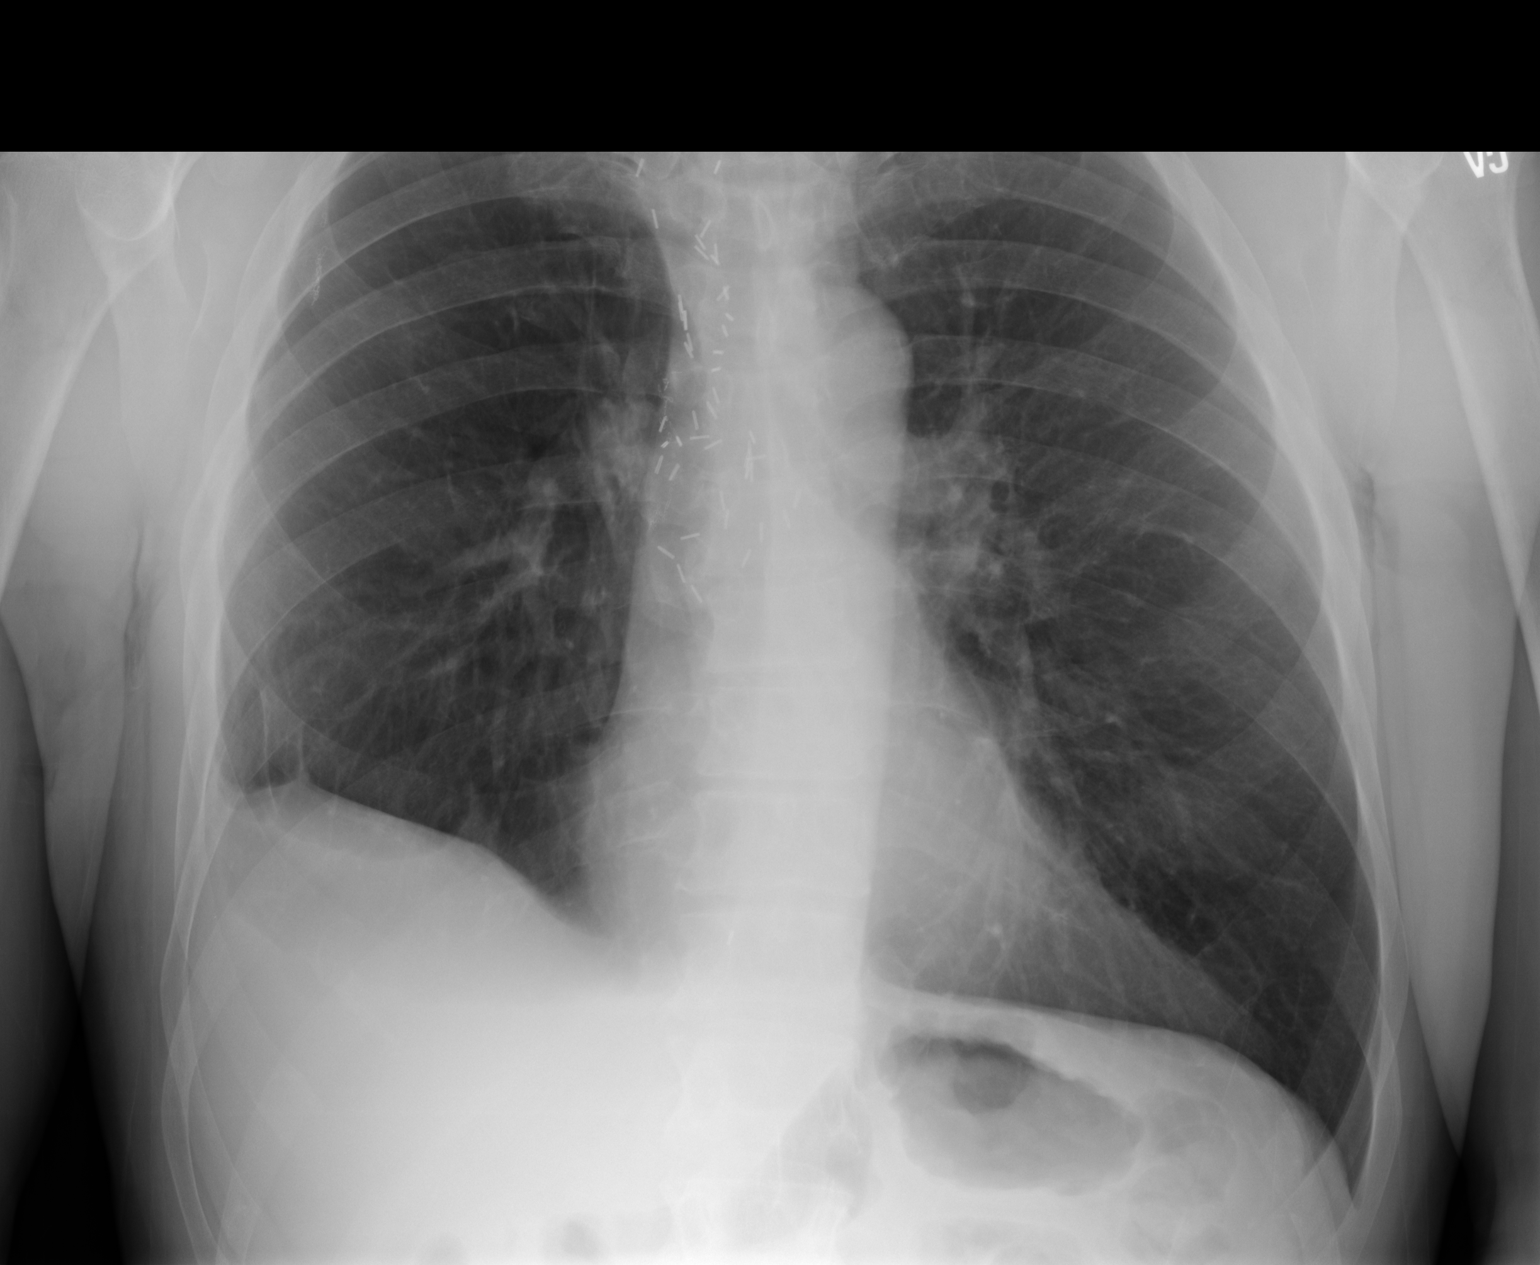
[im 3/3]
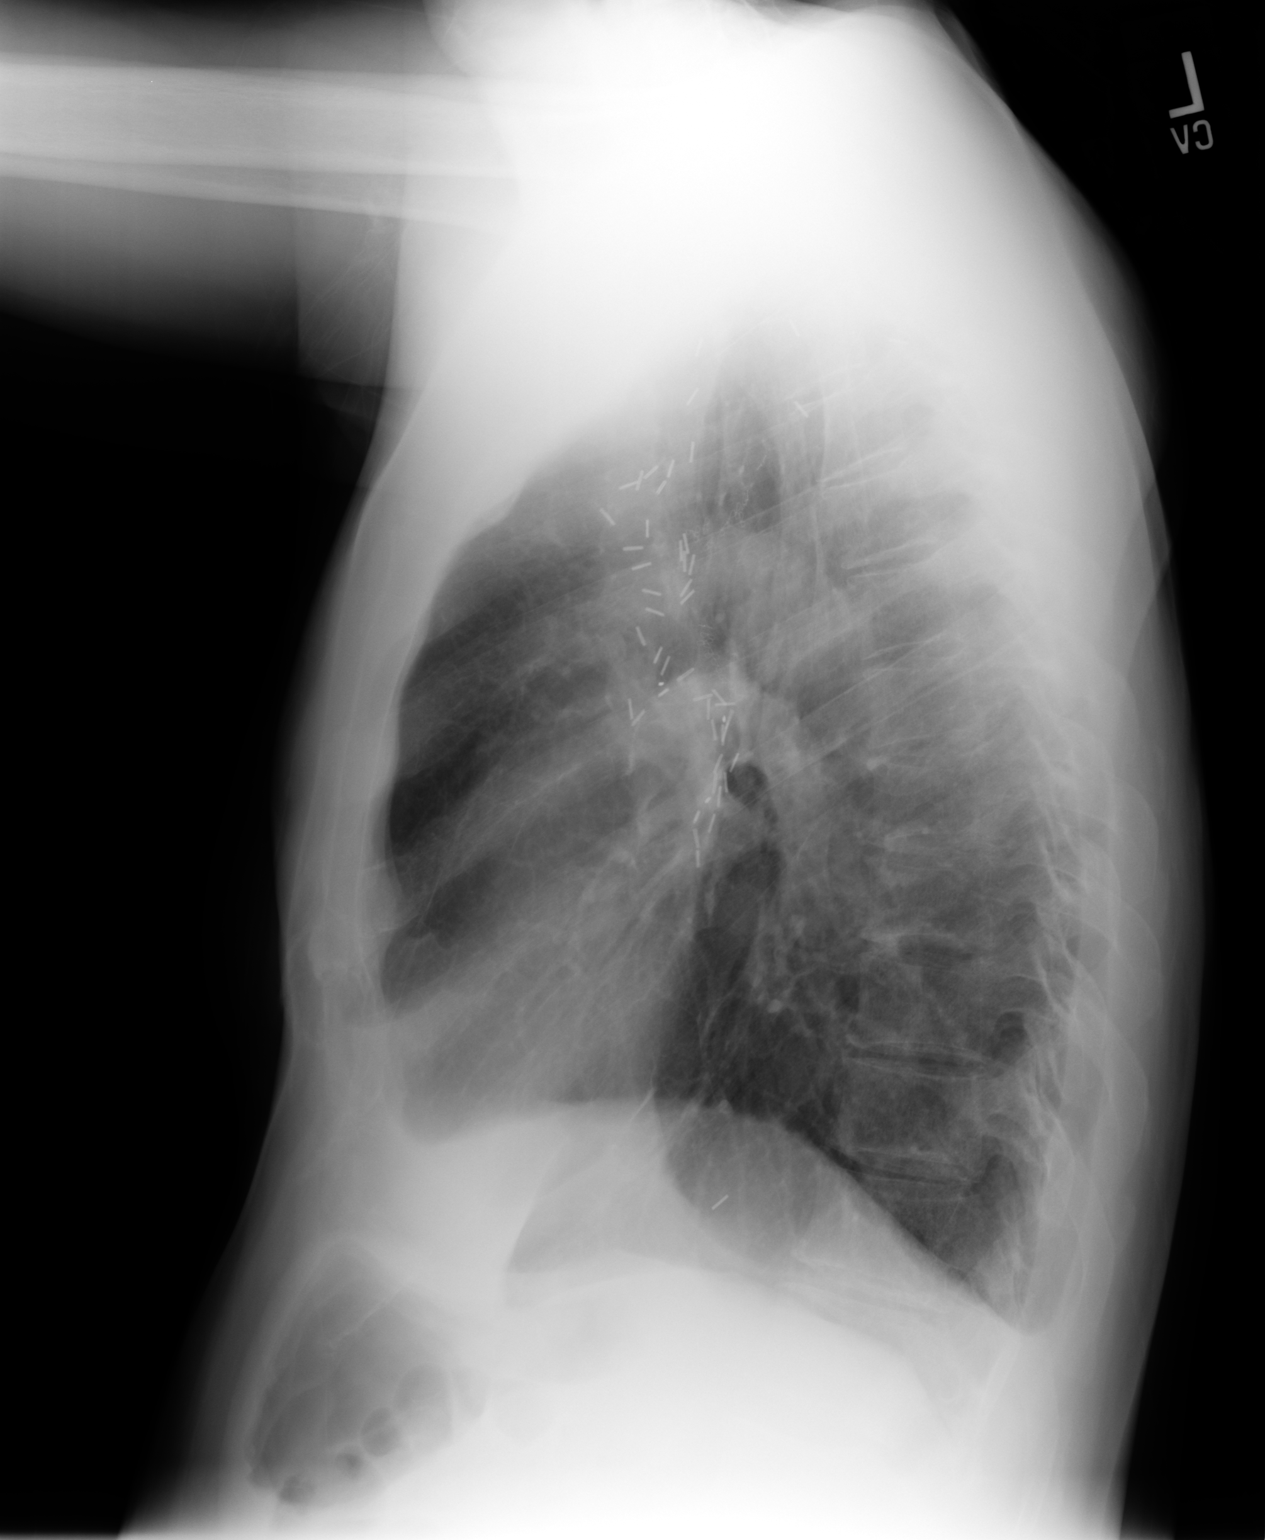

[3 of 3 positions shown; findings below may reference images not displayed]

PROCEDURE:     KDR - KDXR CHEST PA (OR AP) AND LAT  - July 06, 2010 [DATE]

RESULT:     Comparison is made to a prior exam of 07/02/2008. Fibrotic
changes are again noted laterally at the right lung base. The lung fields
are clear of infiltrate. No hilar adenopathy is seen. Postoperative metallic
clips are noted medially in the right lung. No recurrent bulk tumor is seen.
The left lung field is clear. Heart size is normal.
IMPRESSION: 1.     No acute changes are identified as compared to the prior exam of
07/02/2008.

## 2011-11-23 DIAGNOSIS — Z86010 Personal history of colonic polyps: Secondary | ICD-10-CM | POA: Diagnosis not present

## 2011-11-23 DIAGNOSIS — K5730 Diverticulosis of large intestine without perforation or abscess without bleeding: Secondary | ICD-10-CM | POA: Diagnosis not present

## 2011-11-24 DIAGNOSIS — Z23 Encounter for immunization: Secondary | ICD-10-CM | POA: Diagnosis not present

## 2011-11-24 DIAGNOSIS — A6920 Lyme disease, unspecified: Secondary | ICD-10-CM | POA: Diagnosis not present

## 2012-01-19 DIAGNOSIS — M5030 Other cervical disc degeneration, unspecified cervical region: Secondary | ICD-10-CM | POA: Diagnosis not present

## 2012-01-19 DIAGNOSIS — M47812 Spondylosis without myelopathy or radiculopathy, cervical region: Secondary | ICD-10-CM | POA: Diagnosis not present

## 2012-01-21 ENCOUNTER — Ambulatory Visit: Payer: Self-pay | Admitting: Internal Medicine

## 2012-01-21 DIAGNOSIS — M5030 Other cervical disc degeneration, unspecified cervical region: Secondary | ICD-10-CM | POA: Diagnosis not present

## 2012-01-21 DIAGNOSIS — M542 Cervicalgia: Secondary | ICD-10-CM | POA: Diagnosis not present

## 2012-01-26 DIAGNOSIS — M47812 Spondylosis without myelopathy or radiculopathy, cervical region: Secondary | ICD-10-CM | POA: Diagnosis not present

## 2012-01-26 DIAGNOSIS — I10 Essential (primary) hypertension: Secondary | ICD-10-CM | POA: Diagnosis not present

## 2012-01-26 DIAGNOSIS — M542 Cervicalgia: Secondary | ICD-10-CM | POA: Diagnosis not present

## 2012-04-26 DIAGNOSIS — M129 Arthropathy, unspecified: Secondary | ICD-10-CM | POA: Diagnosis not present

## 2012-05-09 DIAGNOSIS — H1045 Other chronic allergic conjunctivitis: Secondary | ICD-10-CM | POA: Diagnosis not present

## 2012-07-13 DIAGNOSIS — C44319 Basal cell carcinoma of skin of other parts of face: Secondary | ICD-10-CM | POA: Diagnosis not present

## 2012-07-13 DIAGNOSIS — D485 Neoplasm of uncertain behavior of skin: Secondary | ICD-10-CM | POA: Diagnosis not present

## 2012-07-13 DIAGNOSIS — D239 Other benign neoplasm of skin, unspecified: Secondary | ICD-10-CM | POA: Diagnosis not present

## 2012-07-13 DIAGNOSIS — L821 Other seborrheic keratosis: Secondary | ICD-10-CM | POA: Diagnosis not present

## 2012-07-13 DIAGNOSIS — L820 Inflamed seborrheic keratosis: Secondary | ICD-10-CM | POA: Diagnosis not present

## 2012-07-13 DIAGNOSIS — Z85820 Personal history of malignant melanoma of skin: Secondary | ICD-10-CM | POA: Diagnosis not present

## 2012-07-13 DIAGNOSIS — L578 Other skin changes due to chronic exposure to nonionizing radiation: Secondary | ICD-10-CM | POA: Diagnosis not present

## 2012-08-09 DIAGNOSIS — Z125 Encounter for screening for malignant neoplasm of prostate: Secondary | ICD-10-CM | POA: Diagnosis not present

## 2012-08-09 DIAGNOSIS — Z87891 Personal history of nicotine dependence: Secondary | ICD-10-CM | POA: Diagnosis not present

## 2012-08-09 DIAGNOSIS — M129 Arthropathy, unspecified: Secondary | ICD-10-CM | POA: Diagnosis not present

## 2012-08-09 DIAGNOSIS — I10 Essential (primary) hypertension: Secondary | ICD-10-CM | POA: Diagnosis not present

## 2012-08-31 DIAGNOSIS — Z85828 Personal history of other malignant neoplasm of skin: Secondary | ICD-10-CM | POA: Diagnosis not present

## 2012-08-31 DIAGNOSIS — L821 Other seborrheic keratosis: Secondary | ICD-10-CM | POA: Diagnosis not present

## 2012-08-31 DIAGNOSIS — Z85820 Personal history of malignant melanoma of skin: Secondary | ICD-10-CM | POA: Diagnosis not present

## 2012-08-31 DIAGNOSIS — C44319 Basal cell carcinoma of skin of other parts of face: Secondary | ICD-10-CM | POA: Diagnosis not present

## 2012-08-31 DIAGNOSIS — L820 Inflamed seborrheic keratosis: Secondary | ICD-10-CM | POA: Diagnosis not present

## 2013-02-12 DIAGNOSIS — H01009 Unspecified blepharitis unspecified eye, unspecified eyelid: Secondary | ICD-10-CM | POA: Diagnosis not present

## 2013-02-22 DIAGNOSIS — J209 Acute bronchitis, unspecified: Secondary | ICD-10-CM | POA: Diagnosis not present

## 2013-03-10 ENCOUNTER — Emergency Department: Payer: Self-pay | Admitting: Emergency Medicine

## 2013-03-10 DIAGNOSIS — N410 Acute prostatitis: Secondary | ICD-10-CM | POA: Diagnosis not present

## 2013-03-10 DIAGNOSIS — R339 Retention of urine, unspecified: Secondary | ICD-10-CM | POA: Diagnosis not present

## 2013-03-10 DIAGNOSIS — N3000 Acute cystitis without hematuria: Secondary | ICD-10-CM | POA: Diagnosis not present

## 2013-03-10 DIAGNOSIS — N3090 Cystitis, unspecified without hematuria: Secondary | ICD-10-CM | POA: Diagnosis not present

## 2013-03-10 DIAGNOSIS — N419 Inflammatory disease of prostate, unspecified: Secondary | ICD-10-CM | POA: Diagnosis not present

## 2013-03-10 DIAGNOSIS — Z85118 Personal history of other malignant neoplasm of bronchus and lung: Secondary | ICD-10-CM | POA: Diagnosis not present

## 2013-03-10 DIAGNOSIS — Z79899 Other long term (current) drug therapy: Secondary | ICD-10-CM | POA: Diagnosis not present

## 2013-03-10 LAB — BASIC METABOLIC PANEL
Anion Gap: 5 — ABNORMAL LOW (ref 7–16)
BUN: 16 mg/dL (ref 7–18)
Calcium, Total: 8.5 mg/dL (ref 8.5–10.1)
Chloride: 103 mmol/L (ref 98–107)
Co2: 26 mmol/L (ref 21–32)
Creatinine: 0.89 mg/dL (ref 0.60–1.30)
EGFR (African American): >60
EGFR (Non-African Amer.): >60
Glucose: 103 mg/dL — ABNORMAL HIGH (ref 65–99)
Osmolality: 270 (ref 275–301)
Potassium: 3.9 mmol/L (ref 3.5–5.1)
Sodium: 134 mmol/L — ABNORMAL LOW (ref 136–145)

## 2013-03-10 LAB — URINALYSIS, COMPLETE
Bacteria: NONE SEEN
Bilirubin,UR: NEGATIVE
Glucose,UR: NEGATIVE mg/dL (ref 0–75)
Hyaline Cast: 8
Ketone: NEGATIVE
Nitrite: NEGATIVE
Ph: 5 (ref 4.5–8.0)
Protein: 30
RBC,UR: 54 /HPF (ref 0–5)
Specific Gravity: 1.016 (ref 1.003–1.030)
Squamous Epithelial: NONE SEEN
WBC UR: 76 /HPF (ref 0–5)

## 2013-03-14 DIAGNOSIS — I861 Scrotal varices: Secondary | ICD-10-CM | POA: Diagnosis not present

## 2013-03-14 DIAGNOSIS — N410 Acute prostatitis: Secondary | ICD-10-CM | POA: Diagnosis not present

## 2013-03-14 DIAGNOSIS — N401 Enlarged prostate with lower urinary tract symptoms: Secondary | ICD-10-CM | POA: Diagnosis not present

## 2013-04-02 ENCOUNTER — Ambulatory Visit (INDEPENDENT_AMBULATORY_CARE_PROVIDER_SITE_OTHER): Payer: Medicare Other | Admitting: Family Medicine

## 2013-04-02 ENCOUNTER — Encounter: Payer: Self-pay | Admitting: Family Medicine

## 2013-04-02 ENCOUNTER — Telehealth: Payer: Self-pay

## 2013-04-02 VITALS — BP 144/80 | HR 71 | Temp 97.6°F | Ht 69.0 in | Wt 166.2 lb

## 2013-04-02 DIAGNOSIS — R29898 Other symptoms and signs involving the musculoskeletal system: Secondary | ICD-10-CM | POA: Diagnosis not present

## 2013-04-02 DIAGNOSIS — R011 Cardiac murmur, unspecified: Secondary | ICD-10-CM | POA: Diagnosis not present

## 2013-04-02 DIAGNOSIS — N419 Inflammatory disease of prostate, unspecified: Secondary | ICD-10-CM

## 2013-04-02 NOTE — Progress Notes (Signed)
Pre visit review using our clinic review tool, if applicable. No additional management support is needed unless otherwise documented below in the visit note.  New patient.  Hasn't been seen here in 3 years.   To recap- 2/15.  Seen with eyelid infection at [facility].  Given abx drops.  Recheck later at [facility] with bronchitis, started on SABA, oral steroids, levaquin.  Improved some.  Late 2/15- pain with urination, seen at [facility], dx'd with prostatitis.  rx'd cipro.  Had f/u with Dr. Rogers Blocker with uro, started on flomax. Possible allergy/intolerance with change to rapaflo.  Still with puffy eyelids and irritated lips, rhinorrhea.  Face feels sunburned.  No airway sx.  Here for eval today.  Requesting outside records from prev PCP.   Known h/o murmur per patient. Also with R pronator drift noted by patient prev and R leg weakness, with hip flexion.  Neither is new, requesting records.    PMH and SH reviewed  ROS: See HPI, otherwise noncontributory.  Meds, vitals, and allergies reviewed.   GEN: nad, alert and oriented HEENT: mucous membranes moist, OP wnl, scant rhinorrhea noted.  Eyelids slightly puffy inferiorly.  NECK: supple w/o LA CV: rrr.  Murmur noted, radiates to carotids B PULM: ctab, no inc wob ABD: soft, +bs EXT: no edema SKIN: no acute rash R arm pronator drift noted with mild weakness in R hip flexors- old finding per patient.

## 2013-04-02 NOTE — Patient Instructions (Signed)
Stop the rapaflo for now.  If you improve, we'll likely need to list that and flomax as an intolerance.  Finish the cipro and call Rogers Blocker with an update if needed.  When you are checking out, get a records request from your old doctor (from 2009-current).  Take care.  I'll await your records.

## 2013-04-02 NOTE — Telephone Encounter (Signed)
Pt having nasal congestion and cough; pt last saw Dr Council Mechanic 2009 but pt scheduled acute visit and pt will schedule appt to reestablish with Dr Damita Dunnings.

## 2013-04-03 ENCOUNTER — Encounter: Payer: Self-pay | Admitting: Family Medicine

## 2013-04-03 DIAGNOSIS — R29898 Other symptoms and signs involving the musculoskeletal system: Secondary | ICD-10-CM | POA: Insufficient documentation

## 2013-04-03 DIAGNOSIS — R011 Cardiac murmur, unspecified: Secondary | ICD-10-CM | POA: Insufficient documentation

## 2013-04-03 NOTE — Assessment & Plan Note (Signed)
Requesting old records.  No acute changes per patient.

## 2013-04-03 NOTE — Assessment & Plan Note (Signed)
He could have sun sensitivity from cipro and URI sx from flomax and rapaflo.  Would finish the cipro given his sx and stop rapaflo.  He'll d/w uro in meantime. He agrees.

## 2013-04-23 DIAGNOSIS — N410 Acute prostatitis: Secondary | ICD-10-CM | POA: Diagnosis not present

## 2013-04-24 DIAGNOSIS — N401 Enlarged prostate with lower urinary tract symptoms: Secondary | ICD-10-CM | POA: Diagnosis not present

## 2013-04-24 DIAGNOSIS — N138 Other obstructive and reflux uropathy: Secondary | ICD-10-CM | POA: Diagnosis not present

## 2013-05-04 ENCOUNTER — Encounter: Payer: Self-pay | Admitting: Family Medicine

## 2013-05-04 ENCOUNTER — Ambulatory Visit (INDEPENDENT_AMBULATORY_CARE_PROVIDER_SITE_OTHER): Payer: Medicare Other | Admitting: Family Medicine

## 2013-05-04 VITALS — BP 148/78 | HR 72 | Temp 98.0°F | Wt 170.0 lb

## 2013-05-04 DIAGNOSIS — R05 Cough: Secondary | ICD-10-CM | POA: Insufficient documentation

## 2013-05-04 DIAGNOSIS — R011 Cardiac murmur, unspecified: Secondary | ICD-10-CM

## 2013-05-04 DIAGNOSIS — Z8619 Personal history of other infectious and parasitic diseases: Secondary | ICD-10-CM

## 2013-05-04 DIAGNOSIS — R059 Cough, unspecified: Secondary | ICD-10-CM | POA: Diagnosis not present

## 2013-05-04 NOTE — Progress Notes (Signed)
Pre visit review using our clinic review tool, if applicable. No additional management support is needed unless otherwise documented below in the visit note.  Per patient- last year had dx lyme disease, had lab testing done.  Was treated at the time.  Discussed.  See plan.   He had uro f/u in the meantime.  Tolerating doxazosin with improved stream.  Allergy list updated.   Cough started earlier in 2015.  Started as a tickle in the throat. Cough is better now, "80% less" than prev.  Had already been on levaquin, SABA and steroids.  No other ongoing sx.   Meds, vitals, and allergies reviewed.   ROS: See HPI.  Otherwise, noncontributory.  nad ncat Mmm, OP wnl Neck supple no LA rrr ctab abd soft Ext w/o edema

## 2013-05-04 NOTE — Assessment & Plan Note (Signed)
Improving, likely post infectious cough.  No tx at this point since he is better.  D/w pt.  Ctab.

## 2013-05-04 NOTE — Assessment & Plan Note (Signed)
Requesting records again. D/w pt.  No known evidence of ongoing infection, ie no "chronic active lyme disease."

## 2013-05-04 NOTE — Patient Instructions (Signed)
The cough should gradually resolve.  We'll request your records again.  Take care.  Glad to see you.

## 2013-05-04 NOTE — Assessment & Plan Note (Signed)
Again with SEM noted, awaiting records.

## 2013-05-20 ENCOUNTER — Encounter: Payer: Self-pay | Admitting: Family Medicine

## 2013-05-20 ENCOUNTER — Telehealth: Payer: Self-pay | Admitting: Family Medicine

## 2013-05-20 IMAGING — CR CERVICAL SPINE - COMPLETE 4+ VIEW
1 series · 7 of 7 positions shown · non-contrast
Comparison: none

REASON FOR EXAM: neck pain
COMMENTS:

[Series 1: w cervical spine lat · 0.14mm/px · 7 of 7 slices shown]
[im 1/7]
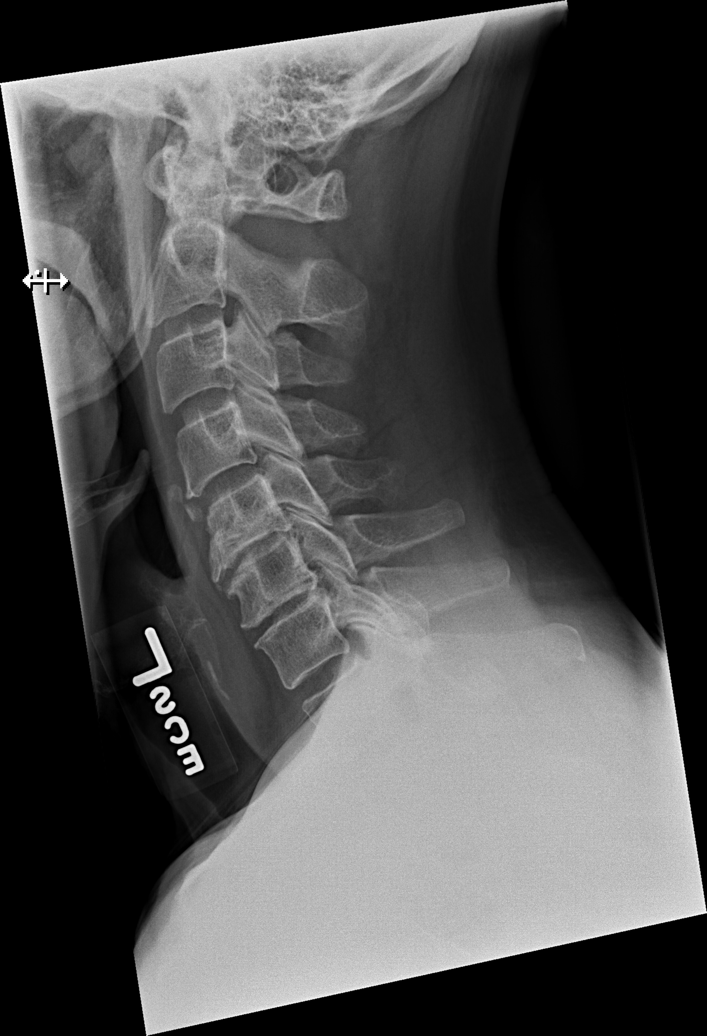
[im 2/7]
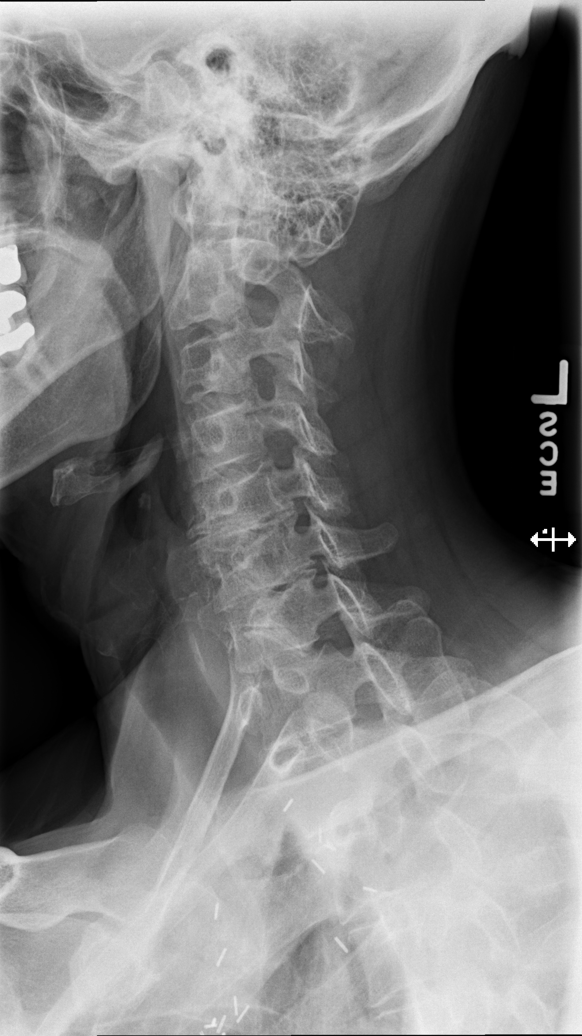
[im 3/7]
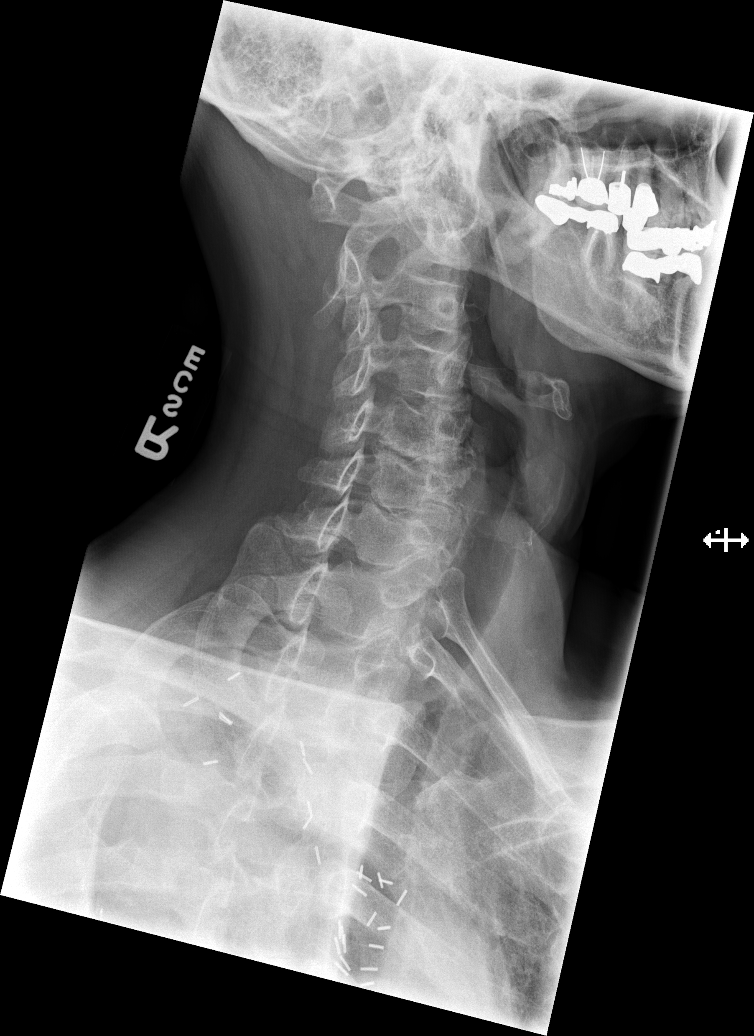
[im 4/7]
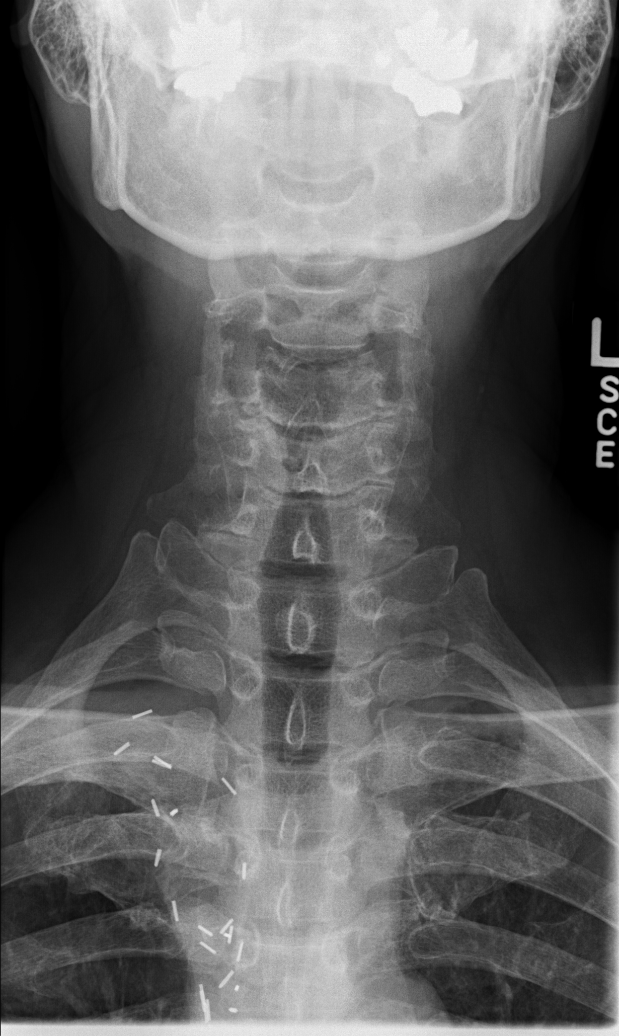
[im 5/7]
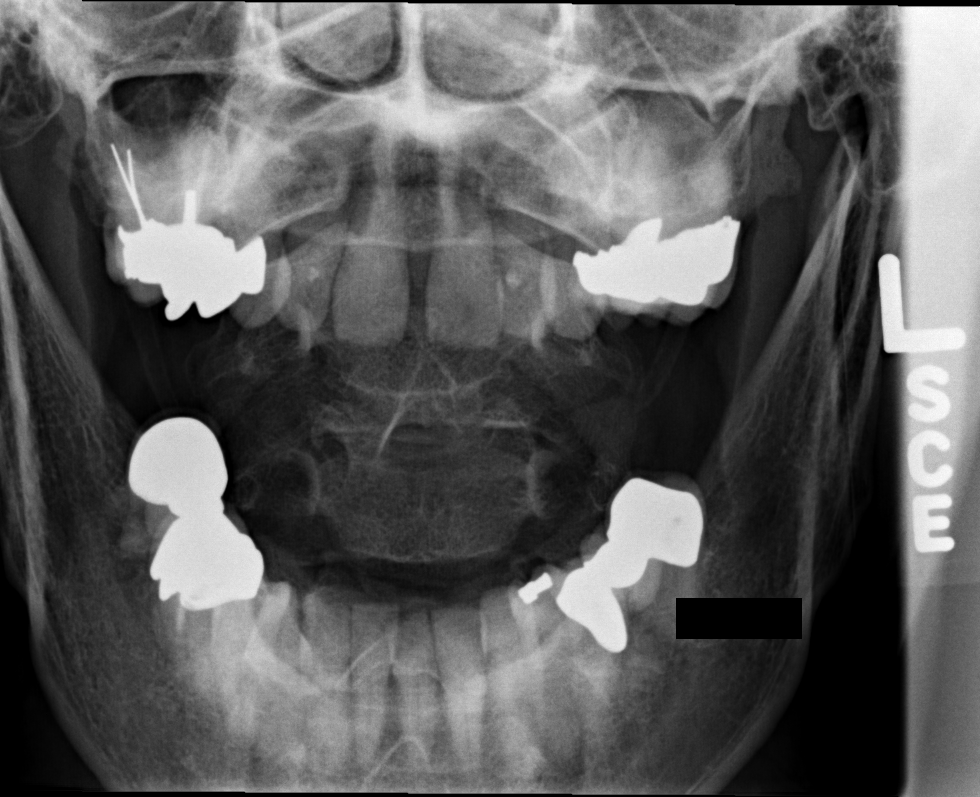
[im 6/7]
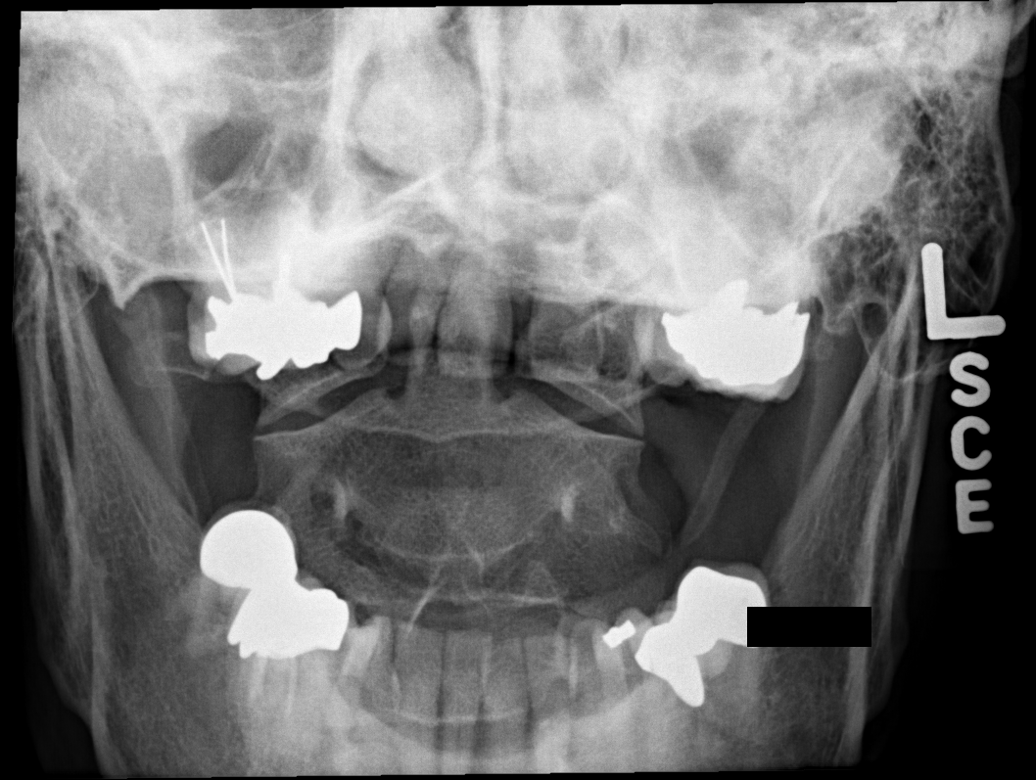
[im 7/7]
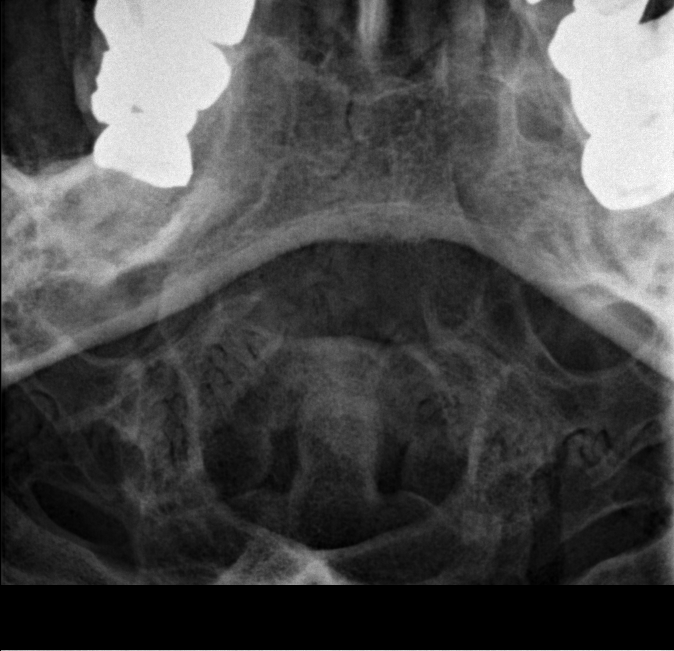

[7 of 7 positions shown; findings below may reference images not displayed]

PROCEDURE:     DXR - DXR CERVICAL SPINE COMPLETE  - January 21, 2012 [DATE]

RESULT:     Prevertebral soft tissues are normal. Degenerative disc
narrowing is present at C5-C6 and C6-C7 with some hypertrophic spurring and
some minimal uncinate spurring into the foraminal regions especially on the
left at these levels. Correlate clinically. MRI is available for further
assessment if desired. The atlantoaxial alignment appears to be normal. The
visualized odontoid appears to be unremarkable.
IMPRESSION: C5-C6 and C6-C7 degenerative change.

[REDACTED]

## 2013-05-20 NOTE — Telephone Encounter (Signed)
Notify pt.  Per records, has aortic stenosis. I don't see any w/u of this, echo or o/w.  Also, I reviewed his records about possible lyme disease.  He had an abnormal test but the follow up testing was negative. That means he didn't have lyme disease.  There is exactly zero lab evidence available showing that he had lyme disease.  He likely had a false positive on the initial testing.  That happens in a small (but not insignificant) percentage of the population (~5%) who never had lyme disease.   Thanks.

## 2013-05-21 NOTE — Telephone Encounter (Signed)
Patient notified as instructed by telephone. Was advised by patient that he has an echo done about 1 and 1/2 years ago by his previous doctor.

## 2013-05-21 NOTE — Telephone Encounter (Signed)
Please send for the echo report.  Thanks.  His prev record release should cover that.

## 2013-05-22 NOTE — Telephone Encounter (Signed)
Brett Mcguire (Medical records) called [facility] for Echo report and was advised that patient had a stress test done. Copy of stress test received and in your in box. Called and spoke to the patient and was advised that he knew that he had a test, but unsure what it was. Patient stated that he would have a ECHO if Dr. Damita Dunnings feels that he needs to have one done once he reviews the stress test.

## 2013-05-22 NOTE — Telephone Encounter (Signed)
Will review, thanks.

## 2013-05-23 ENCOUNTER — Telehealth: Payer: Self-pay | Admitting: Family Medicine

## 2013-05-23 ENCOUNTER — Encounter: Payer: Self-pay | Admitting: Family Medicine

## 2013-05-23 NOTE — Telephone Encounter (Signed)
Patient notified as instructed by telephone. 

## 2013-05-23 NOTE — Telephone Encounter (Signed)
Notify pt.  Per records, echo and exercise stress test wnl 2012.  No aortic stenosis on echo.  Records updated.  Thanks.

## 2013-05-25 DIAGNOSIS — N401 Enlarged prostate with lower urinary tract symptoms: Secondary | ICD-10-CM | POA: Diagnosis not present

## 2013-05-30 ENCOUNTER — Telehealth: Payer: Self-pay

## 2013-05-30 DIAGNOSIS — J302 Other seasonal allergic rhinitis: Secondary | ICD-10-CM

## 2013-05-30 MED ORDER — MONTELUKAST SODIUM 10 MG PO TABS
10.0000 mg | ORAL_TABLET | Freq: Every day | ORAL | Status: DC
Start: 2013-05-30 — End: 2013-12-13

## 2013-05-30 NOTE — Telephone Encounter (Signed)
Sent. Noted, await uro results.

## 2013-05-30 NOTE — Telephone Encounter (Signed)
Pt left v/m requesting new rx of Singulair for allergies to [facility]; pt has been seeing urologist who OKed pt to take Singulair for allergies but urologist cannot prescribe the med. Pt also wanted Dr Damita Dunnings to know that he is having biopsy of prostate on 06/06/13; PSA was 5 and concerned could be cancer.

## 2013-05-30 NOTE — Telephone Encounter (Signed)
Patient notified as instructed by telephone. 

## 2013-06-06 DIAGNOSIS — R972 Elevated prostate specific antigen [PSA]: Secondary | ICD-10-CM | POA: Diagnosis not present

## 2013-06-06 DIAGNOSIS — N4289 Other specified disorders of prostate: Secondary | ICD-10-CM | POA: Diagnosis not present

## 2013-06-06 DIAGNOSIS — D291 Benign neoplasm of prostate: Secondary | ICD-10-CM | POA: Diagnosis not present

## 2013-06-06 DIAGNOSIS — N401 Enlarged prostate with lower urinary tract symptoms: Secondary | ICD-10-CM | POA: Diagnosis not present

## 2013-06-13 DIAGNOSIS — N138 Other obstructive and reflux uropathy: Secondary | ICD-10-CM | POA: Diagnosis not present

## 2013-06-13 DIAGNOSIS — R972 Elevated prostate specific antigen [PSA]: Secondary | ICD-10-CM | POA: Diagnosis not present

## 2013-06-13 DIAGNOSIS — N401 Enlarged prostate with lower urinary tract symptoms: Secondary | ICD-10-CM | POA: Diagnosis not present

## 2013-06-19 DIAGNOSIS — N401 Enlarged prostate with lower urinary tract symptoms: Secondary | ICD-10-CM | POA: Diagnosis not present

## 2013-06-19 DIAGNOSIS — R972 Elevated prostate specific antigen [PSA]: Secondary | ICD-10-CM | POA: Diagnosis not present

## 2013-07-30 DIAGNOSIS — D239 Other benign neoplasm of skin, unspecified: Secondary | ICD-10-CM | POA: Diagnosis not present

## 2013-07-30 DIAGNOSIS — D485 Neoplasm of uncertain behavior of skin: Secondary | ICD-10-CM | POA: Diagnosis not present

## 2013-07-30 DIAGNOSIS — L820 Inflamed seborrheic keratosis: Secondary | ICD-10-CM | POA: Diagnosis not present

## 2013-07-30 DIAGNOSIS — L578 Other skin changes due to chronic exposure to nonionizing radiation: Secondary | ICD-10-CM | POA: Diagnosis not present

## 2013-07-30 DIAGNOSIS — L821 Other seborrheic keratosis: Secondary | ICD-10-CM | POA: Diagnosis not present

## 2013-07-30 DIAGNOSIS — Z85828 Personal history of other malignant neoplasm of skin: Secondary | ICD-10-CM | POA: Diagnosis not present

## 2013-07-30 DIAGNOSIS — Z85820 Personal history of malignant melanoma of skin: Secondary | ICD-10-CM | POA: Diagnosis not present

## 2013-07-30 DIAGNOSIS — I789 Disease of capillaries, unspecified: Secondary | ICD-10-CM | POA: Diagnosis not present

## 2013-07-31 ENCOUNTER — Ambulatory Visit: Payer: Self-pay | Admitting: Dermatology

## 2013-07-31 DIAGNOSIS — R918 Other nonspecific abnormal finding of lung field: Secondary | ICD-10-CM | POA: Diagnosis not present

## 2013-07-31 DIAGNOSIS — C3490 Malignant neoplasm of unspecified part of unspecified bronchus or lung: Secondary | ICD-10-CM | POA: Diagnosis not present

## 2013-07-31 DIAGNOSIS — N138 Other obstructive and reflux uropathy: Secondary | ICD-10-CM | POA: Diagnosis not present

## 2013-07-31 DIAGNOSIS — R972 Elevated prostate specific antigen [PSA]: Secondary | ICD-10-CM | POA: Diagnosis not present

## 2013-07-31 DIAGNOSIS — N320 Bladder-neck obstruction: Secondary | ICD-10-CM | POA: Diagnosis not present

## 2013-07-31 DIAGNOSIS — N401 Enlarged prostate with lower urinary tract symptoms: Secondary | ICD-10-CM | POA: Diagnosis not present

## 2013-07-31 DIAGNOSIS — Z9889 Other specified postprocedural states: Secondary | ICD-10-CM | POA: Diagnosis not present

## 2013-07-31 DIAGNOSIS — Z85118 Personal history of other malignant neoplasm of bronchus and lung: Secondary | ICD-10-CM | POA: Diagnosis not present

## 2013-08-07 DIAGNOSIS — N138 Other obstructive and reflux uropathy: Secondary | ICD-10-CM | POA: Diagnosis not present

## 2013-08-07 DIAGNOSIS — N401 Enlarged prostate with lower urinary tract symptoms: Secondary | ICD-10-CM | POA: Diagnosis not present

## 2013-08-22 DIAGNOSIS — H01009 Unspecified blepharitis unspecified eye, unspecified eyelid: Secondary | ICD-10-CM | POA: Diagnosis not present

## 2013-09-07 DIAGNOSIS — N401 Enlarged prostate with lower urinary tract symptoms: Secondary | ICD-10-CM | POA: Diagnosis not present

## 2013-09-07 DIAGNOSIS — N138 Other obstructive and reflux uropathy: Secondary | ICD-10-CM | POA: Diagnosis not present

## 2013-09-07 DIAGNOSIS — D400 Neoplasm of uncertain behavior of prostate: Secondary | ICD-10-CM | POA: Diagnosis not present

## 2013-09-07 DIAGNOSIS — N500 Atrophy of testis: Secondary | ICD-10-CM | POA: Diagnosis not present

## 2013-11-02 DIAGNOSIS — E291 Testicular hypofunction: Secondary | ICD-10-CM | POA: Diagnosis not present

## 2013-11-02 DIAGNOSIS — N401 Enlarged prostate with lower urinary tract symptoms: Secondary | ICD-10-CM | POA: Diagnosis not present

## 2013-11-06 DIAGNOSIS — N138 Other obstructive and reflux uropathy: Secondary | ICD-10-CM | POA: Diagnosis not present

## 2013-11-06 DIAGNOSIS — N401 Enlarged prostate with lower urinary tract symptoms: Secondary | ICD-10-CM | POA: Diagnosis not present

## 2013-11-09 DIAGNOSIS — N401 Enlarged prostate with lower urinary tract symptoms: Secondary | ICD-10-CM | POA: Diagnosis not present

## 2013-11-09 DIAGNOSIS — D400 Neoplasm of uncertain behavior of prostate: Secondary | ICD-10-CM | POA: Diagnosis not present

## 2013-11-09 DIAGNOSIS — N138 Other obstructive and reflux uropathy: Secondary | ICD-10-CM | POA: Diagnosis not present

## 2013-12-13 ENCOUNTER — Ambulatory Visit (INDEPENDENT_AMBULATORY_CARE_PROVIDER_SITE_OTHER)
Admission: RE | Admit: 2013-12-13 | Discharge: 2013-12-13 | Disposition: A | Discharge status: Home / Self Care | Payer: Medicare Other | Source: Ambulatory Visit | Attending: Family Medicine | Admitting: Family Medicine

## 2013-12-13 ENCOUNTER — Ambulatory Visit (INDEPENDENT_AMBULATORY_CARE_PROVIDER_SITE_OTHER): Payer: Medicare Other | Admitting: Family Medicine

## 2013-12-13 ENCOUNTER — Encounter: Payer: Self-pay | Admitting: Family Medicine

## 2013-12-13 VITALS — BP 174/88 | HR 69 | Temp 98.6°F | Wt 171.2 lb

## 2013-12-13 DIAGNOSIS — R05 Cough: Secondary | ICD-10-CM

## 2013-12-13 DIAGNOSIS — IMO0001 Reserved for inherently not codable concepts without codable children: Secondary | ICD-10-CM

## 2013-12-13 DIAGNOSIS — R030 Elevated blood-pressure reading, without diagnosis of hypertension: Secondary | ICD-10-CM | POA: Diagnosis not present

## 2013-12-13 DIAGNOSIS — R059 Cough, unspecified: Secondary | ICD-10-CM

## 2013-12-13 LAB — CBC WITH DIFFERENTIAL/PLATELET
Basophils Absolute: 0 10*3/uL (ref 0.0–0.1)
Basophils Relative: 0.6 % (ref 0.0–3.0)
Eosinophils Absolute: 0.2 10*3/uL (ref 0.0–0.7)
Eosinophils Relative: 3.1 % (ref 0.0–5.0)
HCT: 41.6 % (ref 39.0–52.0)
Hemoglobin: 13.9 g/dL (ref 13.0–17.0)
Lymphocytes Relative: 30.5 % (ref 12.0–46.0)
Lymphs Abs: 2.2 10*3/uL (ref 0.7–4.0)
MCHC: 33.3 g/dL (ref 30.0–36.0)
MCV: 96.3 fl (ref 78.0–100.0)
Monocytes Absolute: 0.6 10*3/uL (ref 0.1–1.0)
Monocytes Relative: 8.8 % (ref 3.0–12.0)
Neutro Abs: 4 10*3/uL (ref 1.4–7.7)
Neutrophils Relative %: 57 % (ref 43.0–77.0)
Platelets: 320 10*3/uL (ref 150.0–400.0)
RBC: 4.32 Mil/uL (ref 4.22–5.81)
RDW: 13.6 % (ref 11.5–15.5)
WBC: 7.1 10*3/uL (ref 4.0–10.5)

## 2013-12-13 NOTE — Progress Notes (Signed)
Pre visit review using our clinic review tool, if applicable. No additional management support is needed unless otherwise documented below in the visit note.  Sx started about 1.5 weeks ago, with itchy eyelids and eye crusting, crusted shut.  Then noted in the last few days more cough and phlegm, esp at night.   Slept sitting up and took some advil.  No fevers, no vomiting, no diarrhea since stopping finasteride.    Mult med allergies, update list today.  Off all rx meds.   Meds, vitals, and allergies reviewed.   ROS: See HPI.  Otherwise, noncontributory.  nad ncat Tm wnl Nasal exam slightly stuffy  OP wnl, mmm, no erythema Neck supple, no LA Rrr, SEM noted ctab abd soft, normal BS Ext w/o edema

## 2013-12-13 NOTE — Patient Instructions (Signed)
Go to the lab on the way out.  We'll contact you with your lab and xray report.  Check your BP as few times out of clinic and update me with the readings.  Take care. Glad to see you.

## 2013-12-15 LAB — COMPREHENSIVE METABOLIC PANEL
ALT: 16 U/L (ref 0–53)
AST: 22 U/L (ref 0–37)
Albumin: 4.1 g/dL (ref 3.5–5.2)
Alkaline Phosphatase: 49 U/L (ref 39–117)
BUN: 19 mg/dL (ref 6–23)
CO2: 25 mEq/L (ref 19–32)
Calcium: 9 mg/dL (ref 8.4–10.5)
Chloride: 102 mEq/L (ref 96–112)
Creatinine, Ser: 1 mg/dL (ref 0.4–1.5)
GFR: 78.07 mL/min (ref >60.00)
Glucose, Bld: 97 mg/dL (ref 70–99)
Potassium: 3.9 mEq/L (ref 3.5–5.1)
Sodium: 136 mEq/L (ref 135–145)
Total Bilirubin: 0.6 mg/dL (ref 0.2–1.2)
Total Protein: 7.1 g/dL (ref 6.0–8.3)

## 2013-12-16 NOTE — Assessment & Plan Note (Addendum)
Nontoxic, no PNA on exam, see notes on labs.  Ctab, no tx other than prn ibuprofen (he reported that ibuprofen had helped some prev).

## 2013-12-28 ENCOUNTER — Emergency Department: Payer: Self-pay | Admitting: Emergency Medicine

## 2013-12-28 DIAGNOSIS — F419 Anxiety disorder, unspecified: Secondary | ICD-10-CM | POA: Diagnosis not present

## 2013-12-28 DIAGNOSIS — Z87891 Personal history of nicotine dependence: Secondary | ICD-10-CM | POA: Diagnosis not present

## 2013-12-28 DIAGNOSIS — R51 Headache: Secondary | ICD-10-CM | POA: Diagnosis not present

## 2013-12-28 DIAGNOSIS — R05 Cough: Secondary | ICD-10-CM | POA: Diagnosis not present

## 2013-12-28 DIAGNOSIS — R42 Dizziness and giddiness: Secondary | ICD-10-CM | POA: Diagnosis not present

## 2013-12-28 DIAGNOSIS — I10 Essential (primary) hypertension: Secondary | ICD-10-CM | POA: Diagnosis not present

## 2013-12-28 LAB — BASIC METABOLIC PANEL
Anion Gap: 9 (ref 7–16)
BUN: 17 mg/dL (ref 7–18)
Calcium, Total: 8.5 mg/dL (ref 8.5–10.1)
Chloride: 105 mmol/L (ref 98–107)
Co2: 25 mmol/L (ref 21–32)
Creatinine: 1.02 mg/dL (ref 0.60–1.30)
EGFR (African American): >60
EGFR (Non-African Amer.): >60
Glucose: 119 mg/dL — ABNORMAL HIGH (ref 65–99)
Osmolality: 280 (ref 275–301)
Potassium: 4.7 mmol/L (ref 3.5–5.1)
Sodium: 139 mmol/L (ref 136–145)

## 2013-12-28 LAB — CBC WITH DIFFERENTIAL/PLATELET
Basophil #: 0 10*3/uL (ref 0.0–0.1)
Basophil %: 0.8 %
Eosinophil #: 0.1 10*3/uL (ref 0.0–0.7)
Eosinophil %: 2 %
HCT: 42.5 % (ref 40.0–52.0)
HGB: 13.9 g/dL (ref 13.0–18.0)
Lymphocyte #: 1.7 10*3/uL (ref 1.0–3.6)
Lymphocyte %: 29.6 %
MCH: 31.9 pg (ref 26.0–34.0)
MCHC: 32.8 g/dL (ref 32.0–36.0)
MCV: 97 fL (ref 80–100)
Monocyte #: 0.6 x10 3/mm (ref 0.2–1.0)
Monocyte %: 10.8 %
Neutrophil #: 3.3 10*3/uL (ref 1.4–6.5)
Neutrophil %: 56.8 %
Platelet: 273 10*3/uL (ref 150–440)
RBC: 4.38 10*6/uL — ABNORMAL LOW (ref 4.40–5.90)
RDW: 13.2 % (ref 11.5–14.5)
WBC: 5.9 10*3/uL (ref 3.8–10.6)

## 2013-12-28 LAB — TROPONIN I: Troponin-I: <0.02 ng/mL

## 2013-12-31 ENCOUNTER — Ambulatory Visit (INDEPENDENT_AMBULATORY_CARE_PROVIDER_SITE_OTHER): Payer: Medicare Other | Admitting: Family Medicine

## 2013-12-31 ENCOUNTER — Encounter: Payer: Self-pay | Admitting: Family Medicine

## 2013-12-31 ENCOUNTER — Telehealth: Payer: Self-pay

## 2013-12-31 VITALS — BP 150/76 | HR 80 | Temp 98.3°F | Wt 171.2 lb

## 2013-12-31 DIAGNOSIS — I10 Essential (primary) hypertension: Secondary | ICD-10-CM | POA: Diagnosis not present

## 2013-12-31 NOTE — Progress Notes (Signed)
Pre visit review using our clinic review tool, if applicable. No additional management support is needed unless otherwise documented below in the visit note.  Seen at ER with diffusely high BP.  CT head neg, labs were okay.  Notes and results reviewed with patient. No CP; has trace BLE edema on CCB now.  He has a slightly dec in exercise tolerance, unclear if from starting his BP medicine.  He had a HA after starting CCB, but it resolved.   Compliant with CCB at this point.  BP improved today.   We talked about the inc in BP that usually happens after age 29.   He knows to stay off salt.  He's already on a low salt diet.   Meds, vitals, and allergies reviewed.   ROS: See HPI.  Otherwise, noncontributory.  GEN: nad, alert and oriented HEENT: mucous membranes moist NECK: supple w/o LA CV: rrr PULM: ctab, no inc wob ABD: soft, +bs EXT: trace, almost zero, BLE edema

## 2013-12-31 NOTE — Telephone Encounter (Signed)
Please see what info you can get from [facility].  Thanks.

## 2013-12-31 NOTE — Patient Instructions (Signed)
Don't change your meds.  Update me with your BP early next week- either a call or a my chart message.  AM checks, after urination.  Take care. Glad to see you.

## 2013-12-31 NOTE — Telephone Encounter (Signed)
Pt has appt today at 2:30 pm with Dr Damita Dunnings.

## 2013-12-31 NOTE — Telephone Encounter (Signed)
Faxed for info.

## 2013-12-31 NOTE — Telephone Encounter (Signed)
Received and given to Dr. Damita Dunnings.

## 2013-12-31 NOTE — Telephone Encounter (Signed)
PLEASE NOTE: All timestamps contained within this report are represented as [place] Standard Time. CONFIDENTIALTY NOTICE: This fax transmission is intended only for the addressee. It contains information that is legally privileged, confidential or otherwise protected from use or disclosure. If you are not the intended recipient, you are strictly prohibited from reviewing, disclosing, copying using or disseminating any of this information or taking any action in reliance on or regarding this information. If you have received this fax in error, please notify us immediately by telephone so that we can arrange for its return to [place]. Phone: [phone], Toll-Free: [phone], Fax: [phone] Page: 1 of 1 Call Id: [id number] [place] Patient Name: Brett Mcguire Gender: Male DOB: September 15, 1945 Age: 69 Y 24 D Return Phone Number: Address: City/State/Zip: [place] [facility] Night - Client Client Site [place] Physician Renford Dills Contact Type Call Call Type Page Only Caller Name Dr Karma Greaser [phone] Relationship To Patient Provider Is this call to report lab results? No Return Phone Number Unavailable Initial Comment ( 1st Attempt ) Caller states she is calling from [facility] and Dr Karma Greaser needs to speak with on call Dr for Dr Damita Dunnings in regards to a patient. Nurse Assessment Guidelines Guideline Title Affirmed Question Affirmed Notes Nurse Date/Time (Eastern Time) Disp. Time Eilene Ghazi Time) Disposition Final User 12/28/2013 5:46:10 PM Send to [place], [place] 12/28/2013 5:58:28 PM Paged On Call to Other Provider Doug Sou, [facility] 12/28/2013 5:59:57 PM Page Completed Yes Doug Sou, [facility] Instructions Given Call Event Type User Date / Time Description Paging DoctorName DoctorPhone DateTime  Result/Outcome Notes Alysia Penna [phone] 12/28/2013 5:58:28 PM Paged On Call to Other Provider [phone] Msg from [facility] at the call center. Please call [facility] at [phone]) regarding [phone] This was done at Dr. Curtis Sites request. Sarajane Jews 12/28/2013 5:59:50 PM Paged On Call to Another Provider

## 2014-01-01 DIAGNOSIS — I10 Essential (primary) hypertension: Secondary | ICD-10-CM | POA: Insufficient documentation

## 2014-01-01 NOTE — Assessment & Plan Note (Signed)
Labs unremarkable at ER, CT neg.  Continue amlodipine 5mg  a day for now and he'll update me with his BP readings in a few days.  We may need to increase to 10mg  a day.  He agrees with plan.

## 2014-01-07 ENCOUNTER — Encounter: Payer: Self-pay | Admitting: Family Medicine

## 2014-01-14 ENCOUNTER — Telehealth: Payer: Self-pay

## 2014-01-14 DIAGNOSIS — I10 Essential (primary) hypertension: Secondary | ICD-10-CM

## 2014-01-14 MED ORDER — AMLODIPINE BESYLATE 10 MG PO TABS: 10.0000 mg | ORAL_TABLET | Freq: Every day | ORAL | Status: DC

## 2014-01-14 NOTE — Telephone Encounter (Signed)
Pt left v/m; pt was seen 12/31/13 pt was to cb with averaging BP readings; 7 AM BP average 187/91 P67, 12 noon average 159/82 P 65 and 10 pm average reading is 161/77 P 65. Pt will need refill Norvasc to [facility]; pt said dosage may depend on if Dr Damita Dunnings wants to increase or change the dosage after reviewing these BP readings. Pt request cb.

## 2014-01-14 NOTE — Telephone Encounter (Signed)
Inc to 10mg  a day, new rx sent, have him update [place] with BP in about 1 week like he did with this set.  Thanks.

## 2014-01-15 NOTE — Telephone Encounter (Signed)
Patient advised.

## 2014-01-25 NOTE — Telephone Encounter (Addendum)
Pt left v/m; These are average BPs from 01/15/14 - 01/25/14: 7AM BP 171/84 P 69; 12 noon BP 144/75 P 63 and 10 PM BP 150/75 P 71. [facility] if needed.

## 2014-01-27 MED ORDER — LISINOPRIL 10 MG PO TABS: 10.0000 mg | ORAL_TABLET | Freq: Every day | ORAL | Status: DC

## 2014-01-27 NOTE — Telephone Encounter (Signed)
Continue with 10mg  amlodipine, add on 10mg  lisinopril.  rx sent.  Needs f/u OV/labs in about 10 days.  Thanks.

## 2014-01-27 NOTE — Addendum Note (Signed)
Addended by: Tonia Ghent on: 01/27/2014 05:02 PM   Modules accepted: Orders

## 2014-01-28 NOTE — Telephone Encounter (Signed)
Patient advised. Appointment scheduled.  

## 2014-01-29 DIAGNOSIS — J069 Acute upper respiratory infection, unspecified: Secondary | ICD-10-CM | POA: Diagnosis not present

## 2014-01-29 DIAGNOSIS — J029 Acute pharyngitis, unspecified: Secondary | ICD-10-CM | POA: Diagnosis not present

## 2014-02-08 ENCOUNTER — Ambulatory Visit (INDEPENDENT_AMBULATORY_CARE_PROVIDER_SITE_OTHER): Payer: Medicare Other | Admitting: Family Medicine

## 2014-02-08 ENCOUNTER — Encounter: Payer: Self-pay | Admitting: Family Medicine

## 2014-02-08 VITALS — BP 128/74 | HR 67 | Temp 98.4°F | Wt 168.5 lb

## 2014-02-08 DIAGNOSIS — I10 Essential (primary) hypertension: Secondary | ICD-10-CM

## 2014-02-08 LAB — BASIC METABOLIC PANEL
BUN: 15 mg/dL (ref 6–23)
CO2: 25 mEq/L (ref 19–32)
Calcium: 9.7 mg/dL (ref 8.4–10.5)
Chloride: 102 mEq/L (ref 96–112)
Creatinine, Ser: 0.95 mg/dL (ref 0.40–1.50)
GFR: 83.75 mL/min (ref >60.00)
Glucose, Bld: 91 mg/dL (ref 70–99)
Potassium: 4.3 mEq/L (ref 3.5–5.1)
Sodium: 135 mEq/L (ref 135–145)

## 2014-02-08 NOTE — Patient Instructions (Addendum)
Go to the lab on the way out ([facility]).  We'll contact you with your lab report. Take care. Don't change your meds for now.  Glad to see you.

## 2014-02-08 NOTE — Assessment & Plan Note (Signed)
Improved, he'll get bmet done today on the way out.  He'll check with uro about his BPH.   He feels well o/w.  Prev with neg w/u re: murmur per old records.

## 2014-02-08 NOTE — Progress Notes (Signed)
Pre visit review using our clinic review tool, if applicable. No additional management support is needed unless otherwise documented below in the visit note.  Hypertension: Using medication without problems or lightheadedness: yes Chest pain with exertion:no Edema:no Short of breath:no Average home BPs: improved on home checks.    He does have some BPH sx.  He is planning to call uro about follow up.   Likely with environmental allergies (eye itching and facial flushing) improved in the meantime w/o med change (other than adding lisinopril and is likely unrelated).  Meds, vitals, and allergies reviewed.   PMH and SH reviewed  ROS: See HPI.  Otherwise negative.    GEN: nad, alert and oriented HEENT: mucous membranes moist NECK: supple w/o LA CV: rrr. SEM noted, no sig change from prev PULM: ctab, no inc wob ABD: soft, +bs EXT: no edema

## 2014-02-08 NOTE — Addendum Note (Signed)
Addended by: Ellamae Sia on: 02/08/2014 10:35 AM   Modules accepted: Orders

## 2014-02-11 ENCOUNTER — Telehealth: Payer: Self-pay | Admitting: Family Medicine

## 2014-02-11 ENCOUNTER — Encounter: Payer: Self-pay | Admitting: *Deleted

## 2014-02-11 NOTE — Telephone Encounter (Signed)
emmi emailed °

## 2014-03-11 ENCOUNTER — Telehealth: Payer: Self-pay | Admitting: *Deleted

## 2014-03-11 NOTE — Telephone Encounter (Signed)
That's fine.  Thanks. Continue as is.

## 2014-03-11 NOTE — Telephone Encounter (Signed)
Patient advised.

## 2014-03-11 NOTE — Telephone Encounter (Signed)
Patient left a voicemail wanting to let Dr. Damita Dunnings know that his BP for February was 132/69 and heart rate was 68.

## 2014-03-13 DIAGNOSIS — R351 Nocturia: Secondary | ICD-10-CM | POA: Diagnosis not present

## 2014-03-13 DIAGNOSIS — Z125 Encounter for screening for malignant neoplasm of prostate: Secondary | ICD-10-CM | POA: Diagnosis not present

## 2014-03-13 DIAGNOSIS — D400 Neoplasm of uncertain behavior of prostate: Secondary | ICD-10-CM | POA: Diagnosis not present

## 2014-03-13 DIAGNOSIS — N401 Enlarged prostate with lower urinary tract symptoms: Secondary | ICD-10-CM | POA: Diagnosis not present

## 2014-03-18 ENCOUNTER — Telehealth: Payer: Self-pay

## 2014-03-18 NOTE — Telephone Encounter (Signed)
Pt left v/m; pt has been taking Norvasc and zesteril for awhile but pt thinks that he may be getting side effects; pt has rash on legs and arm and nausea in AM. Pt wants to know what to do? Does pt need to come in for appt. Pt last seen 02/08/14.

## 2014-03-18 NOTE — Telephone Encounter (Signed)
Patient advised. Appt scheduled.

## 2014-03-18 NOTE — Telephone Encounter (Signed)
Needs to be checked to eval the rash.  If he thinks it is from the medicine, then stop both.  Needs OV scheduled soon.  Thanks.

## 2014-03-19 ENCOUNTER — Encounter: Payer: Self-pay | Admitting: Family Medicine

## 2014-03-19 ENCOUNTER — Ambulatory Visit (INDEPENDENT_AMBULATORY_CARE_PROVIDER_SITE_OTHER): Payer: Medicare Other | Admitting: Family Medicine

## 2014-03-19 VITALS — BP 114/58 | HR 74 | Temp 98.7°F | Wt 168.0 lb

## 2014-03-19 DIAGNOSIS — N418 Other inflammatory diseases of prostate: Secondary | ICD-10-CM | POA: Diagnosis not present

## 2014-03-19 DIAGNOSIS — R059 Cough, unspecified: Secondary | ICD-10-CM

## 2014-03-19 DIAGNOSIS — R21 Rash and other nonspecific skin eruption: Secondary | ICD-10-CM | POA: Diagnosis not present

## 2014-03-19 DIAGNOSIS — R05 Cough: Secondary | ICD-10-CM

## 2014-03-19 MED ORDER — TRIAMCINOLONE ACETONIDE 0.1 % EX CREA: 1.0000 "application " | TOPICAL_CREAM | Freq: Two times a day (BID) | CUTANEOUS | Status: DC

## 2014-03-19 MED ORDER — LOSARTAN POTASSIUM 25 MG PO TABS: 25.0000 mg | ORAL_TABLET | Freq: Every day | ORAL | Status: DC

## 2014-03-19 NOTE — Progress Notes (Signed)
Pre visit review using our clinic review tool, if applicable. No additional management support is needed unless otherwise documented below in the visit note.  For 3 days in a row, 1/2 hour after taking lisinopril he got a tickle in his throat that would lead to a cough.  He was fatigued after taking lisinopril.  He skipped both BP meds today.  Usually had been 130s/60-70s.  He doesn't have much post nasal gtt usually during the day, does have some at night.    Rash on calf and arms noted recently.  Itchy.  No trigger known.  Aquaphor helps.  Using benadryl topically helped.    Urinary sx.  Slow urine in AM.  Saw Dr. Eliberto Ivory last week.  PSA per patient report.    Meds, vitals, and allergies reviewed.   ROS: See HPI.  Otherwise, noncontributory.  GEN: nad, alert and oriented HEENT: mucous membranes moist, OP wnl, no lip edema NECK: supple w/o LA, no stridor CV: rrr.  PULM: ctab, no inc wob ABD: soft, +bs EXT: no edema SKIN: Blanching maculopapular rash on the R skin and B forearms.

## 2014-03-19 NOTE — Patient Instructions (Signed)
Stop lisinopril for now.   Use the TAC cream if needed for the rash.  That is likely incidental.  Let me know if the cough doesn't get better.  If you BP is >140/>90, then add on losartan.  Restart amlodipine in the meantime.  If the urinary symptoms aren't better, then check with urology.

## 2014-03-20 DIAGNOSIS — R21 Rash and other nonspecific skin eruption: Secondary | ICD-10-CM | POA: Insufficient documentation

## 2014-03-20 NOTE — Assessment & Plan Note (Signed)
Can use topical TAC.  This is likely not related to meds.  Likely dry skin, local irritation, etc, ie benign process.  D/w pt.

## 2014-03-20 NOTE — Assessment & Plan Note (Signed)
Possible ACE cough, stop ACE and change to ARB if BP stays elevated.  Restart CCB in meantime.  D/w pt.  If stopping ACE doesn't help, then consider treatment for post nasal gtt.

## 2014-03-20 NOTE — Assessment & Plan Note (Signed)
He can f/u with uro.  D/w pt.

## 2014-04-12 ENCOUNTER — Telehealth: Payer: Self-pay | Admitting: *Deleted

## 2014-04-12 NOTE — Telephone Encounter (Signed)
Wife advised. 

## 2014-04-12 NOTE — Telephone Encounter (Signed)
Pt called and left a message that he is suppose to call every month with an average bp reading. On 03/31/14 BP was 130/71, pulse 61.

## 2014-04-12 NOTE — Telephone Encounter (Signed)
BP is fine.  Only needs to notify us if BP is >140/>90.  Thanks for update.

## 2014-04-29 ENCOUNTER — Other Ambulatory Visit: Payer: Self-pay | Admitting: Family Medicine

## 2014-05-15 ENCOUNTER — Ambulatory Visit (INDEPENDENT_AMBULATORY_CARE_PROVIDER_SITE_OTHER): Payer: Medicare Other | Admitting: Family Medicine

## 2014-05-15 ENCOUNTER — Encounter: Payer: Self-pay | Admitting: Family Medicine

## 2014-05-15 VITALS — BP 118/68 | HR 70 | Temp 98.3°F | Wt 164.0 lb

## 2014-05-15 DIAGNOSIS — S41111A Laceration without foreign body of right upper arm, initial encounter: Secondary | ICD-10-CM | POA: Diagnosis not present

## 2014-05-15 DIAGNOSIS — Z23 Encounter for immunization: Secondary | ICD-10-CM | POA: Diagnosis not present

## 2014-05-15 DIAGNOSIS — M7022 Olecranon bursitis, left elbow: Secondary | ICD-10-CM

## 2014-05-15 DIAGNOSIS — S51811A Laceration without foreign body of right forearm, initial encounter: Secondary | ICD-10-CM

## 2014-05-15 NOTE — Patient Instructions (Signed)
Olecranon bursitis.  This should get better.  We shouldn't drain it now.  Your skin tear should gradually heal up.  Take care.  Glad to see you.

## 2014-05-15 NOTE — Progress Notes (Signed)
Pre visit review using our clinic review tool, if applicable. No additional management support is needed unless otherwise documented below in the visit note.  Hit L elbow ~2.5 weeks ago.  Puffy in the meantime.  Not really sore now.  No FCNAV.   Cut R forearm on a piece of wire, ~1 week ago.  Cleaned and covered in the meantime.  Not bleeding now.  Meds, vitals, and allergies reviewed.   ROS: See HPI.  Otherwise, noncontributory.  nad L elbow with olecranon bursitis, puffy but not ttp.  ROM wnl Triangle of skin missing on R upper forearm.  1.5x1.5x1 cm.  Doesn't appear infected.  It looks like he had a triangular skin tear where the flap has been removed. No discharge.

## 2014-05-16 DIAGNOSIS — M7020 Olecranon bursitis, unspecified elbow: Secondary | ICD-10-CM | POA: Insufficient documentation

## 2014-05-16 DIAGNOSIS — S51819A Laceration without foreign body of unspecified forearm, initial encounter: Secondary | ICD-10-CM | POA: Insufficient documentation

## 2014-05-16 NOTE — Assessment & Plan Note (Signed)
Keep clean, should gradually heal, superficial.  Updated tdap.

## 2014-05-16 NOTE — Assessment & Plan Note (Signed)
Not infected, wouldn't need asp at this point.  D/w pt.  Should resolve.  F/u prn.

## 2014-08-01 DIAGNOSIS — L814 Other melanin hyperpigmentation: Secondary | ICD-10-CM | POA: Diagnosis not present

## 2014-08-01 DIAGNOSIS — D485 Neoplasm of uncertain behavior of skin: Secondary | ICD-10-CM | POA: Diagnosis not present

## 2014-08-01 DIAGNOSIS — Z85820 Personal history of malignant melanoma of skin: Secondary | ICD-10-CM | POA: Diagnosis not present

## 2014-08-01 DIAGNOSIS — D1800 Hemangioma unspecified site: Secondary | ICD-10-CM | POA: Diagnosis not present

## 2014-08-01 DIAGNOSIS — D229 Melanocytic nevi, unspecified: Secondary | ICD-10-CM | POA: Diagnosis not present

## 2014-08-01 DIAGNOSIS — Z1283 Encounter for screening for malignant neoplasm of skin: Secondary | ICD-10-CM | POA: Diagnosis not present

## 2014-08-01 DIAGNOSIS — C44319 Basal cell carcinoma of skin of other parts of face: Secondary | ICD-10-CM | POA: Diagnosis not present

## 2014-08-01 DIAGNOSIS — L578 Other skin changes due to chronic exposure to nonionizing radiation: Secondary | ICD-10-CM | POA: Diagnosis not present

## 2014-08-01 DIAGNOSIS — L821 Other seborrheic keratosis: Secondary | ICD-10-CM | POA: Diagnosis not present

## 2014-08-01 DIAGNOSIS — Z85828 Personal history of other malignant neoplasm of skin: Secondary | ICD-10-CM | POA: Diagnosis not present

## 2014-08-20 DIAGNOSIS — C44319 Basal cell carcinoma of skin of other parts of face: Secondary | ICD-10-CM | POA: Diagnosis not present

## 2014-09-09 DIAGNOSIS — Z125 Encounter for screening for malignant neoplasm of prostate: Secondary | ICD-10-CM | POA: Diagnosis not present

## 2014-09-09 DIAGNOSIS — N401 Enlarged prostate with lower urinary tract symptoms: Secondary | ICD-10-CM | POA: Diagnosis not present

## 2014-09-09 DIAGNOSIS — D400 Neoplasm of uncertain behavior of prostate: Secondary | ICD-10-CM | POA: Diagnosis not present

## 2014-09-09 DIAGNOSIS — R3914 Feeling of incomplete bladder emptying: Secondary | ICD-10-CM | POA: Diagnosis not present

## 2014-09-09 DIAGNOSIS — R972 Elevated prostate specific antigen [PSA]: Secondary | ICD-10-CM | POA: Diagnosis not present

## 2014-10-31 DIAGNOSIS — Z23 Encounter for immunization: Secondary | ICD-10-CM | POA: Diagnosis not present

## 2014-11-19 ENCOUNTER — Telehealth: Payer: Self-pay

## 2014-11-19 NOTE — Telephone Encounter (Signed)
Pt was seen earlier today by dentist; pt advised dentist taking norvasc; pt was premedicated prior to dental appt due to murmur.  Pt wants to know if needs to have a [place] of heart to see what degree the heart murmur is. Pt request cb.

## 2014-11-20 NOTE — Telephone Encounter (Signed)
Per records, echo and exercise stress test wnl 2012.  No aortic stenosis on echo prev.   Unless he has new sx or a change on exam, then we likely don't need to do anything about this.  If he has SOB or concerns, then needs OV to discuss and for me to recheck him. Thanks.

## 2014-11-20 NOTE — Telephone Encounter (Signed)
Patient notified as instructed by telephone and verbalized understanding. Patient stated that he would like to come in to discuss this and appointment was scheduled. Patient stated his dentist needs a letter regarding this. Patient stated that the dentist went ahead and put him on an antibiotic to be on the safe side. Advised patient that he can discuss the letter with Dr. Damita Dunnings when he comes in and he agreed.

## 2014-11-25 ENCOUNTER — Encounter: Payer: Self-pay | Admitting: Family Medicine

## 2014-11-25 ENCOUNTER — Ambulatory Visit (INDEPENDENT_AMBULATORY_CARE_PROVIDER_SITE_OTHER): Payer: Medicare Other | Admitting: Family Medicine

## 2014-11-25 VITALS — BP 140/76 | HR 66 | Temp 98.4°F | Wt 162.0 lb

## 2014-11-25 DIAGNOSIS — R011 Cardiac murmur, unspecified: Secondary | ICD-10-CM

## 2014-11-25 NOTE — Patient Instructions (Signed)
Rosaria Ferries will call about your referral. Burnis Medin go from there.  It may be that you have the murmur and still not need antibiotics.   Take care.  Glad to see you.

## 2014-11-25 NOTE — Progress Notes (Signed)
Pre visit review using our clinic review tool, if applicable. No additional management support is needed unless otherwise documented below in the visit note.  Was seen at the dental clinic and dentist (Dr. Altha Harm fax [phone]) had questions about a murmur.   Was given amoxil before last cleaning but hadn't had that before other cleanings.   Per old records, echo and exercise stress test wnl 2012. No aortic stenosis on echo.  Old hx reviewed with patient at [facility].    No CP, SOB, BLE edema, no syncope, not lightheaded.    PMH and SH reviewed  ROS: See HPI, otherwise noncontributory.  Meds, vitals, and allergies reviewed.   GEN: nad, alert and oriented HEENT: mucous membranes moist NECK: supple w/o LA CV: rrr.  SEM noted with radiation to the B carotids, similar to prev exams- d/w pt.   PULM: ctab, no inc wob ABD: soft, +bs EXT: no edema SKIN: no acute rash

## 2014-11-26 ENCOUNTER — Encounter: Payer: Self-pay | Admitting: Family Medicine

## 2014-11-26 NOTE — Assessment & Plan Note (Addendum)
Old records reviewed.  At this point, no emergent sx.  With murmur still present, would be reasonable to recheck echo.  D/w pt.  He agrees.  >25 minutes spent in face to face time with patient, >50% spent in counselling or coordination of care.  I can send a note to dental clinic (Dr. Altha Harm fax [phone]) re: abx use after echo done.  D/w pt about possible abx use vs lack of needed, depending on his situation.   App dental help for patient.

## 2014-11-28 IMAGING — CR DG CHEST 2V
1 series · 2 of 2 positions shown · non-contrast
Comparison: 07/06/2010.

CLINICAL DATA: 67-year-old male with lung cancer history. Initial
encounter.

EXAM:
CHEST  2 VIEW

[Series 1: w chest pa · 0.14mm/px · 2 of 2 slices shown]
[im 1/2]
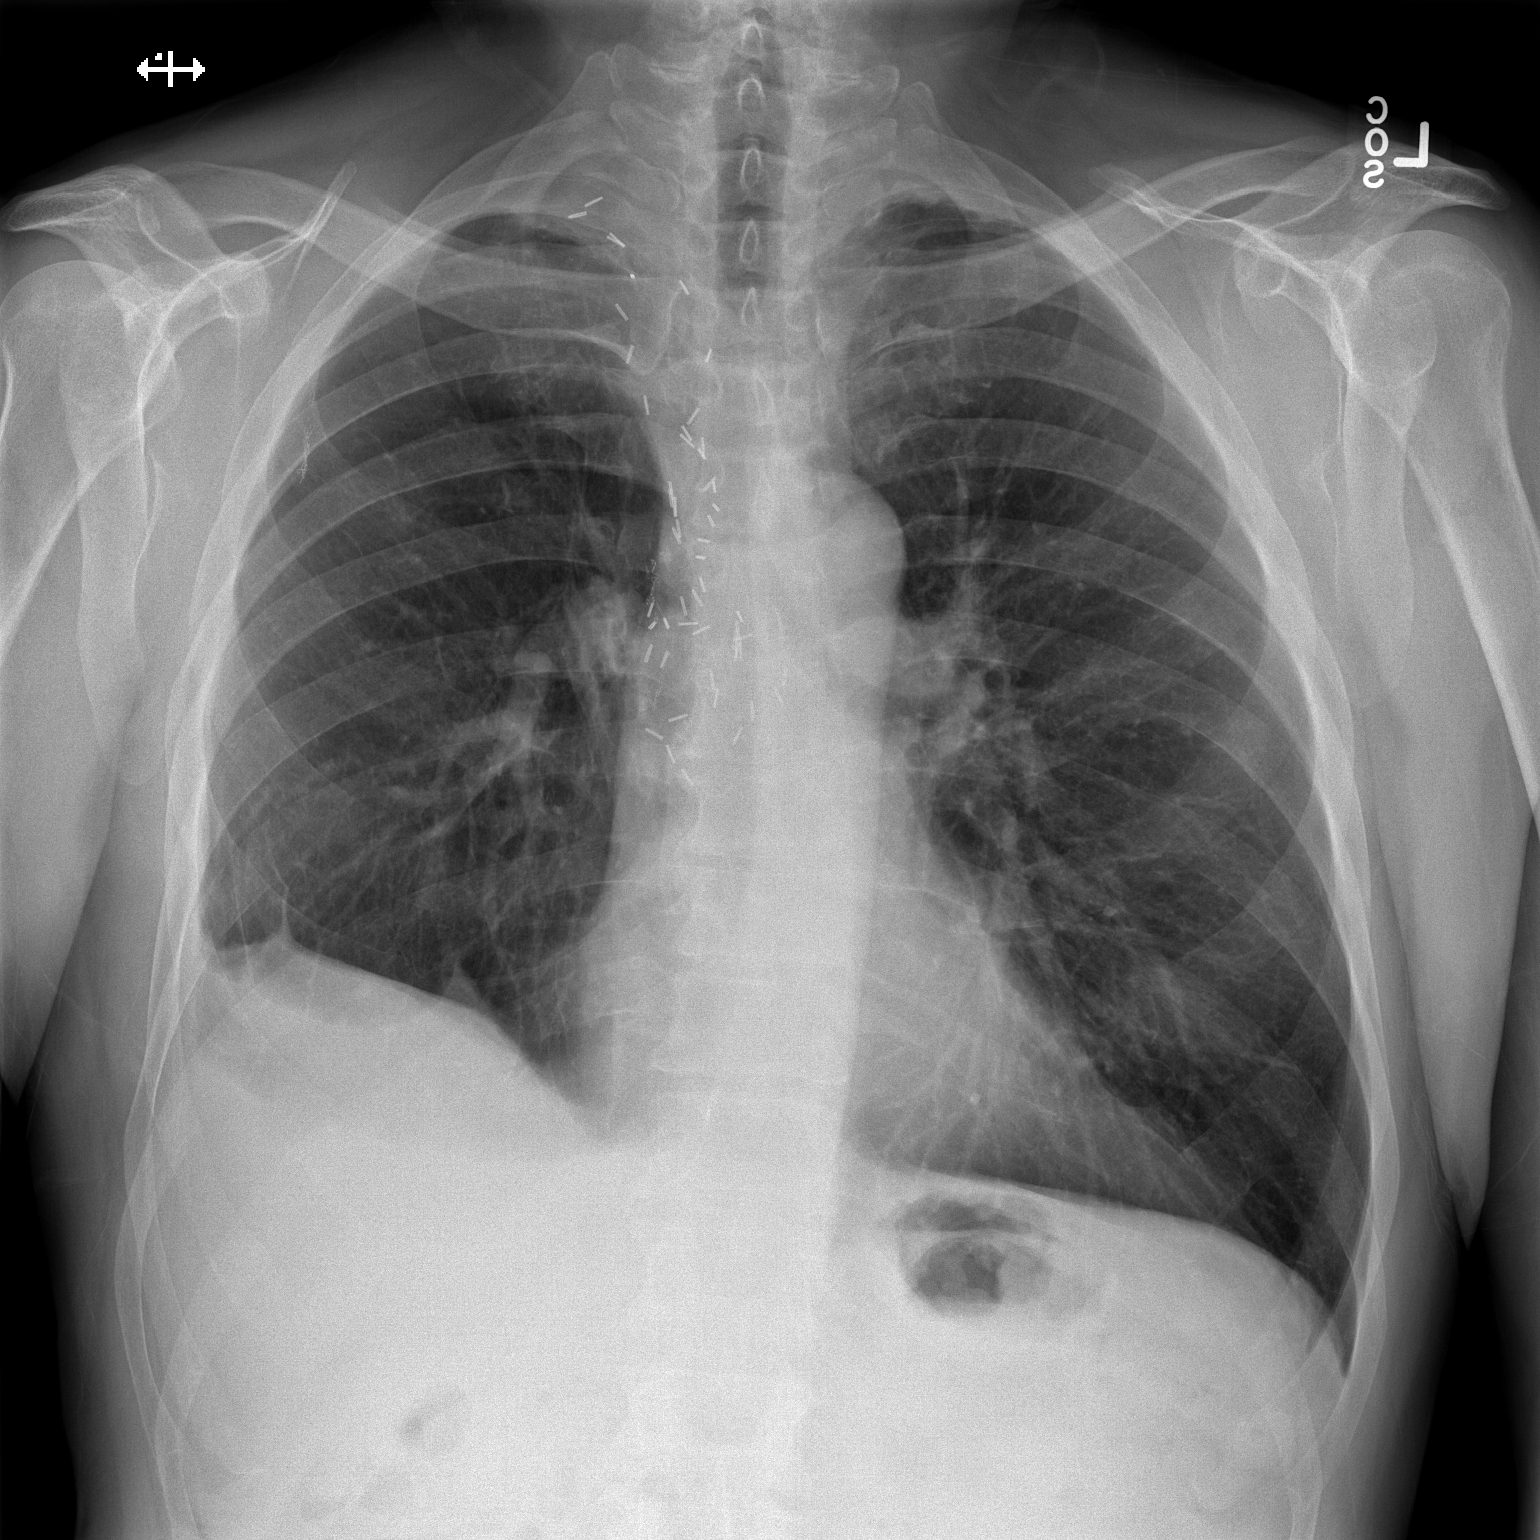
[im 2/2]
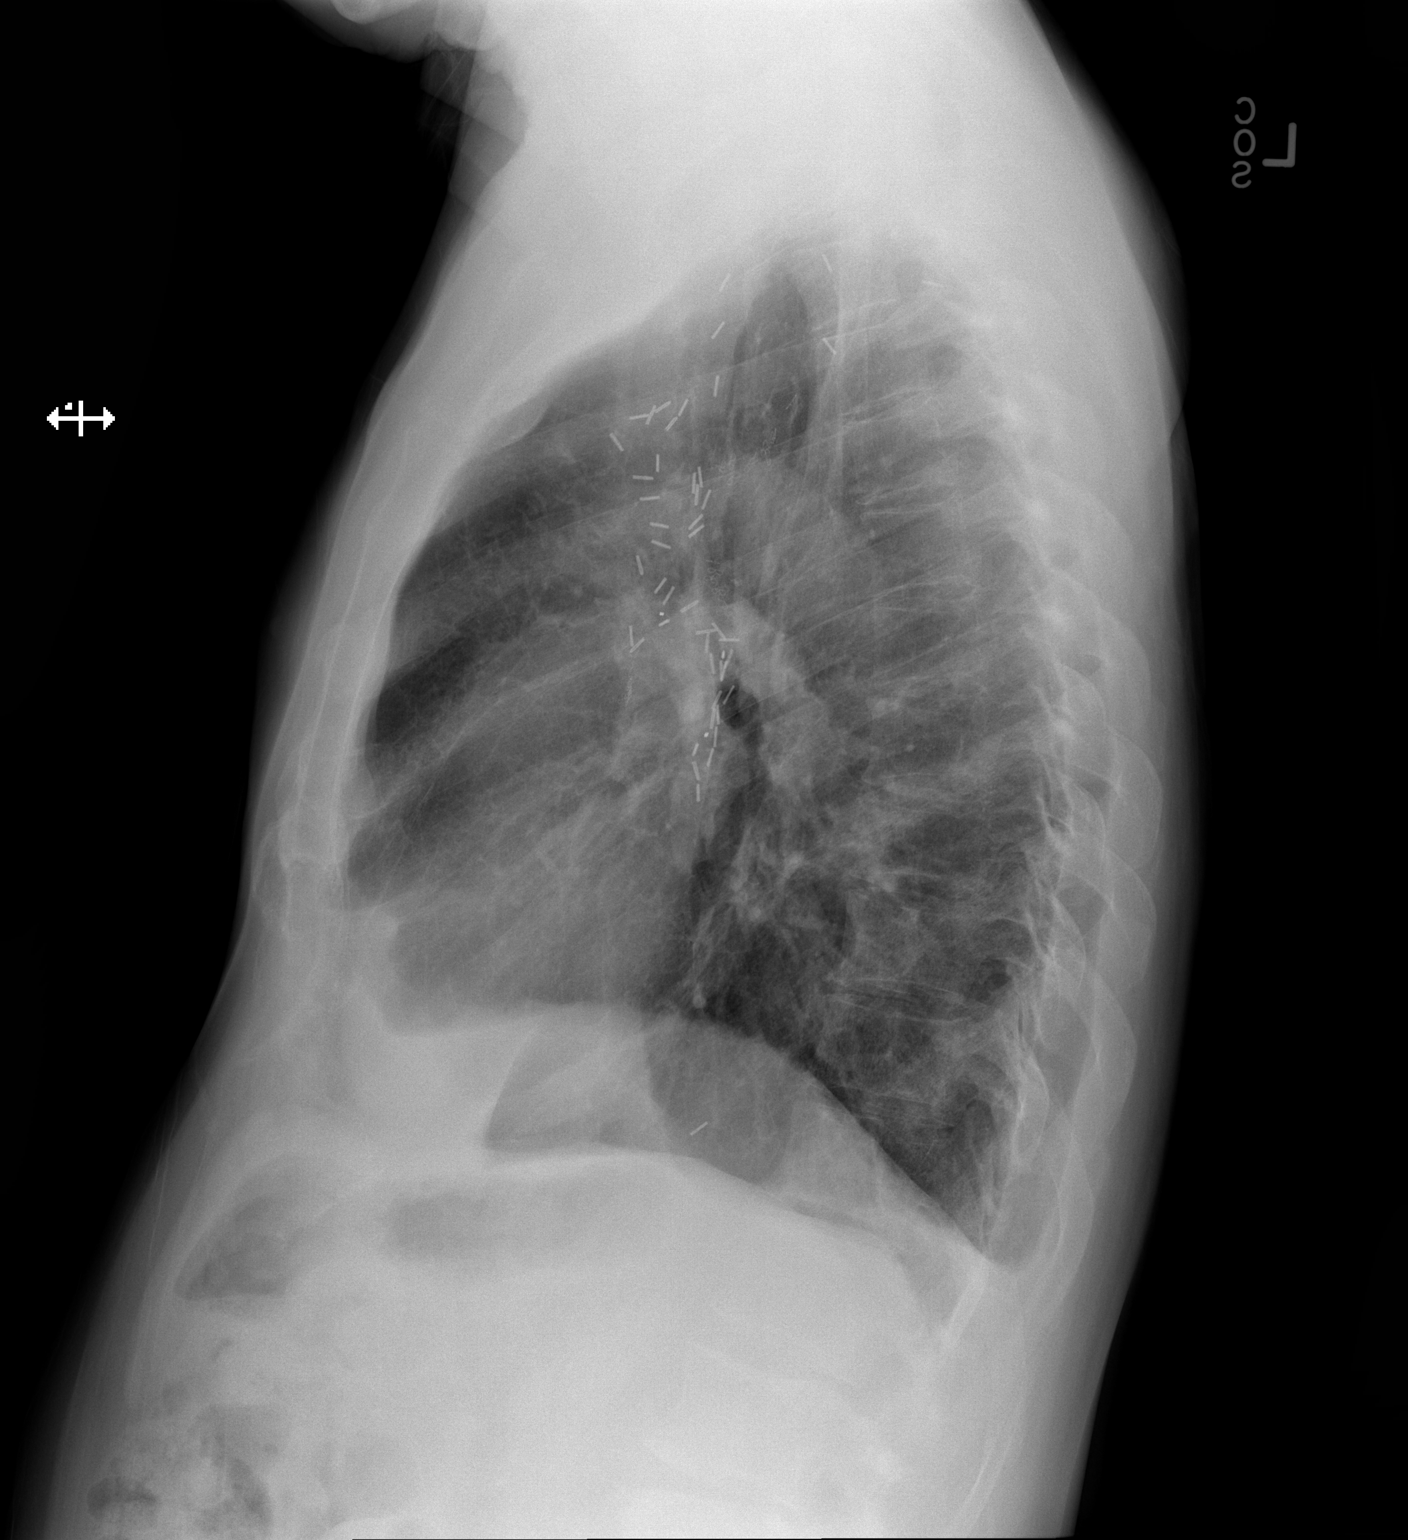

[2 of 2 positions shown; findings below may reference images not displayed]

FINDINGS: Stable reduced volume in the right hemi thorax. Stable surgical
clips and staple lines in the right hemi thorax and mediastinum.
Stable cardiac size and mediastinal contours. Visualized tracheal
air column is within normal limits. No acute pulmonary opacity. No
pneumothorax, pleural effusion, or pulmonary edema. Stable
visualized osseous structures.
IMPRESSION: Stable postoperative appearance of the chest. No new cardiopulmonary
abnormality.

## 2014-12-19 ENCOUNTER — Ambulatory Visit (INDEPENDENT_AMBULATORY_CARE_PROVIDER_SITE_OTHER): Payer: Medicare Other

## 2014-12-19 ENCOUNTER — Other Ambulatory Visit: Payer: Self-pay

## 2014-12-19 DIAGNOSIS — R011 Cardiac murmur, unspecified: Secondary | ICD-10-CM | POA: Diagnosis not present

## 2014-12-20 ENCOUNTER — Other Ambulatory Visit: Payer: Self-pay | Admitting: Family Medicine

## 2014-12-20 DIAGNOSIS — I350 Nonrheumatic aortic (valve) stenosis: Secondary | ICD-10-CM

## 2014-12-24 ENCOUNTER — Telehealth: Payer: Self-pay | Admitting: Family Medicine

## 2014-12-24 NOTE — Telephone Encounter (Signed)
Pt called returning Lugene's phone call regarding results. You can reach him at [phone].

## 2014-12-24 NOTE — Telephone Encounter (Signed)
Patient advised.

## 2015-01-27 ENCOUNTER — Ambulatory Visit (INDEPENDENT_AMBULATORY_CARE_PROVIDER_SITE_OTHER): Payer: Medicare Other | Admitting: Family Medicine

## 2015-01-27 VITALS — BP 122/62 | HR 74 | Temp 98.2°F | Wt 163.4 lb

## 2015-01-27 DIAGNOSIS — R42 Dizziness and giddiness: Secondary | ICD-10-CM | POA: Diagnosis not present

## 2015-01-27 NOTE — Progress Notes (Signed)
Pre visit review using our clinic review tool, if applicable. No additional management support is needed unless otherwise documented below in the visit note. 

## 2015-01-27 NOTE — Patient Instructions (Addendum)
Nice to meet you. Your symptoms are likely related to an inner ear issue. Please continue to monitor.  If you develop persistent dizziness, or headache, numbness, weakness, vision changes, twisting or shortness of breath, palpitations, increase in blood pressure, or any new or change in symptoms please seek medical attention.

## 2015-01-27 NOTE — Progress Notes (Signed)
Patient ID: Brett Mcguire, male   DOB: 1945/03/27, 70 y.o.   MRN: [id number]  Brett Rumps, MD Phone: [phone]  HAMEED KOLAR is a 70 y.o. male who presents today for same-day visit.  Vertigo: Patient notes last Tuesday he developed a dizzy sensation with the room spinning. He notes no other symptoms with this. It went away on its own after a short period of time. Recurred on Friday while he is standing in the kitchen. Notes he had to lay down and it resolved on its own. He notes some mild tinnitus since Friday. Also notes intermittently like there is a plug in the right ear. No ear pain. Denies headache, numbness, weakness, vision changes, persistent vertigo, blood pressure changes, lightheadedness, chest pain, shortness of breath, and fever. Notes he had a similar episode in January of last year where he felt dizzy went to the emergency room and his blood pressure was quite elevated. He was started on Norvasc and blood pressure has been well controlled since then. Does note he has aortic stenosis and has been referred to cardiology. He does note since the first episode he has had intermittent popping in the right side of his neck. He has no symptoms at this time.   PMH: former smoker.   ROS see HPI  Objective  Physical Exam Filed Vitals:   01/27/15 1349  BP: 122/62  Pulse: 74  Temp: 98.2 F (36.8 C)    Physical Exam  Constitutional: He is well-developed, well-nourished, and in no distress.  HENT:  Head: Normocephalic and atraumatic.  Right Ear: External ear normal.  Left Ear: External ear normal.  Mouth/Throat: Oropharynx is clear and moist. No oropharyngeal exudate.  Cerumen impaction in right ear irrigated with syringe and water and normal TM visualized, normal TM on left side  Eyes: Conjunctivae are normal. Pupils are equal, round, and reactive to light.  Neck: Neck supple.  Cardiovascular: Normal rate, regular rhythm and normal heart sounds.  Exam reveals no gallop  and no friction rub.   No murmur heard. Pulmonary/Chest: Effort normal and breath sounds normal. No respiratory distress. He has no wheezes. He has no rales.  Musculoskeletal:  No midline neck tenderness, there is mild discomfort on palpation of proximal trapezius in the lateral side of his neck on the right side  Lymphadenopathy:    He has no cervical adenopathy.  Neurological: He is alert.  CN 2-12 intact, 5/5 strength in bilateral biceps, triceps, grip, quads, hamstrings, plantar and dorsiflexion, sensation to light touch intact in bilateral UE and LE, normal gait, 2+ patellar reflexes, negative Romberg, no pronator drift, negative Dix-Hallpike  Skin: Skin is warm and dry. He is not diaphoretic.     Assessment/Plan: Please see individual problem list.  Vertigo Patient with 2 episodes of self limited vertigo. Asymptomatic at this time. No light headedness or cardiac symptoms to indicate cardiac cause. No other neurological symptoms. He is neurologically intact making CNS process unlikely. Suspect inner ear/vestibular system issue as cause. Discussed options for treatment and work up including treatment with meclizine, referral to ENT, vestibular rehab, or imaging. Patient declined these options as he is asymptomatic at this time and opted to monitor. He will let us know if it recurs. Given return precautions.      Brett Mcguire

## 2015-01-28 ENCOUNTER — Encounter: Payer: Self-pay | Admitting: Family Medicine

## 2015-01-28 DIAGNOSIS — R42 Dizziness and giddiness: Secondary | ICD-10-CM | POA: Insufficient documentation

## 2015-01-28 NOTE — Assessment & Plan Note (Signed)
Patient with 2 episodes of self limited vertigo. Asymptomatic at this time. No light headedness or cardiac symptoms to indicate cardiac cause. No other neurological symptoms. He is neurologically intact making CNS process unlikely. Suspect inner ear/vestibular system issue as cause. Discussed options for treatment and work up including treatment with meclizine, referral to ENT, vestibular rehab, or imaging. Patient declined these options as he is asymptomatic at this time and opted to monitor. He will let us know if it recurs. Given return precautions.

## 2015-02-18 ENCOUNTER — Ambulatory Visit (INDEPENDENT_AMBULATORY_CARE_PROVIDER_SITE_OTHER): Payer: Medicare Other | Admitting: Cardiovascular Disease

## 2015-02-18 ENCOUNTER — Encounter: Payer: Self-pay | Admitting: Cardiovascular Disease

## 2015-02-18 VITALS — BP 130/66 | HR 70 | Ht 69.0 in | Wt 163.2 lb

## 2015-02-18 DIAGNOSIS — I10 Essential (primary) hypertension: Secondary | ICD-10-CM

## 2015-02-18 DIAGNOSIS — I359 Nonrheumatic aortic valve disorder, unspecified: Secondary | ICD-10-CM

## 2015-02-18 DIAGNOSIS — R42 Dizziness and giddiness: Secondary | ICD-10-CM

## 2015-02-18 DIAGNOSIS — R0989 Other specified symptoms and signs involving the circulatory and respiratory systems: Secondary | ICD-10-CM

## 2015-02-18 DIAGNOSIS — R011 Cardiac murmur, unspecified: Secondary | ICD-10-CM

## 2015-02-18 DIAGNOSIS — E785 Hyperlipidemia, unspecified: Secondary | ICD-10-CM

## 2015-02-18 DIAGNOSIS — I350 Nonrheumatic aortic (valve) stenosis: Secondary | ICD-10-CM | POA: Insufficient documentation

## 2015-02-18 NOTE — Assessment & Plan Note (Signed)
Bilateral carotid bruits appreciated on exam, right greater than left He also does report episodes of dizziness if he pushes on the right side of his neck, etiology unclear.  We have ordered a bilateral carotid artery ultrasound for his bruits

## 2015-02-18 NOTE — Progress Notes (Signed)
Patient ID: Brett Mcguire, male    DOB: [birth year], 70 y.o.   MRN: [id number]  HPI Comments: Mr. Brett Mcguire is a very pleasant 70 year old gentleman with prior history of smoking for 17 years, as well as 17 years of secondhand exposure per the patient, lung cancer with resection of the right upper lobe in 1989, quit smoking in 1984, chronic mild shortness of breath at baseline, who presents by referral from Dr. Damita Dunnings for murmur, follow-up of echocardiogram showing aortic valve disease.  He reports that he was told he had a murmur by prior primary care physician, Dr. Rebecka Apley 8 or 9 years ago. No workup was done at that time  Recent echocardiogram shows possible bicuspid aortic valve, moderate aortic valve stenosis Normal ejection fraction greater than 55%, normal right heart pressures  In general he reports that he feels well, denies any change in his breathing at baseline No regular exercise program, limited to chronic baseline shortness of breath which she attributes to prior lung cancer surgery and lung resection  Denies any significant chest pain on exertion He is concerned as sometimes when he pushes on the right side of his neck he has had lightheaded spells  Recently had severe episode of vertigo one month ago lasting for several minutes No recurrence  EKG on today's visit shows normal sinus rhythm with rate 70 bpm, PVC noted otherwise no significant ST or T-wave changes    Allergies  Allergen Reactions  . Alfuzosin     Skin rash, burning sensation of skin  . Doxazosin     Skin rash, burning sensation of skin  . Ephedrine Other (See Comments)    BP spikes   . Finasteride     diarrhea  . Flomax [Tamsulosin Hcl]     Rash  . Rapaflo [Silodosin]     rash  . Sulfonamide Derivatives     REACTION: as child    Current Outpatient Prescriptions on File Prior to Visit  Medication Sig Dispense Refill  . amLODipine (NORVASC) 10 MG tablet TAKE ONE TABLET EVERY DAY 90 tablet 2    No current facility-administered medications on file prior to visit.    Past Medical History  Diagnosis Date  . Diverticulosis 02/2000  . Allergy 1989  . Hyperlipidemia 1989  . Murmur     Per old records, echo and exercise stress test wnl 2012.  No aortic stenosis on echo.   . Right leg weakness   . Murmur   . 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 Lung cancer ([place]) 1989    s/p lobectomy, no chemo or rady    Past Surgical History  Procedure Laterality Date  . Right lobectomy  1989    Lung cancer  . Colonoscopy  02/18/2000    Polyp, diverticulosis, repeat in 5 years  . Malignant melanoma excision  06/20/2003    Dr. Acquanetta Sit  . Mri  10/09/2005    Lumbar spine, bulging disc L5/S1  . Laminectomy/discectomy  11/03/2005    L5/S1  . Colonoscopy  03/03/2006    Repeat  -  Divertics  . Tonsillectomy and adenoidectomy      Social History  reports that he quit smoking about 33 years ago. His smoking use included Cigarettes. He has a 34 pack-year smoking history. He has never used smokeless tobacco. He reports that he drinks alcohol. He reports that he does not use illicit drugs.  Family History family history includes Alcohol abuse in his  mother; Cancer in his father, mother, and other; Prostate cancer in his father.    Review of Systems  Constitutional: Negative.   Respiratory: Negative.   Cardiovascular: Negative.   Gastrointestinal: Negative.   Musculoskeletal: Negative.   Neurological: Positive for dizziness.  Hematological: Negative.   Psychiatric/Behavioral: Negative.   All other systems reviewed and are negative.   BP 130/66 mmHg  Pulse 70  Ht '5\' 9"'$  (1.753 m)  Wt 163 lb 4 oz (74.05 kg)  BMI 24.10 kg/m2   Physical Exam  Constitutional: He is oriented to person, place, and time. He appears well-developed and well-nourished.  HENT:  Head: Normocephalic.  Nose: Nose normal.  Mouth/Throat: Oropharynx is clear and moist.  Eyes: Conjunctivae are  normal. Pupils are equal, round, and reactive to light.  Neck: Normal range of motion. Neck supple. No JVD present. Carotid bruit is present.  Cardiovascular: Normal rate, regular rhythm and intact distal pulses.  Exam reveals no gallop and no friction rub.   Murmur heard.  Systolic murmur is present with a grade of 3/6  Pulmonary/Chest: Effort normal and breath sounds normal. No respiratory distress. He has no wheezes. He has no rales. He exhibits no tenderness.  Abdominal: Soft. Bowel sounds are normal. He exhibits no distension. There is no tenderness.  Musculoskeletal: Normal range of motion. He exhibits no edema or tenderness.  Lymphadenopathy:    He has no cervical adenopathy.  Neurological: He is alert and oriented to person, place, and time. Coordination normal.  Skin: Skin is warm and dry. No rash noted. No erythema.  Psychiatric: He has a normal mood and affect. His behavior is normal. Judgment and thought content normal.

## 2015-02-18 NOTE — Assessment & Plan Note (Signed)
Unable to exclude bicuspid aortic valve Based on velocity, gradient, likely has mild to moderate aortic valve stenosis As he is asymptomatic, low degree of stenosis, no urgency to perform a TEE at this time. Recommended we monitor his aortic valve periodically with echocardiogram. This would likely not need to be done on an annual basis as he is 70 years old, stenosis is only mild to moderate. Would certainly recommend he call us if he has new symptoms of shortness of breath or chest tightness.

## 2015-02-18 NOTE — Assessment & Plan Note (Signed)
He is scheduled to have lab work done next week with Dr. Damita Dunnings Depending on his carotid ultrasound, may need to start a cholesterol medication   We did suggest he strongly consider a CT coronary calcium score if his carotid ultrasound shows stenosis. . Be high risk of underlying coronary artery disease given his long history of smoking. He will think about this and call us back once the results are available for his carotid   Total encounter time more than 45 minutes  Greater than 50% was spent in counseling and coordination of care with the patient

## 2015-02-18 NOTE — Assessment & Plan Note (Signed)
Blood pressure is well controlled on today's visit. No changes made to the medications. 

## 2015-02-18 NOTE — Patient Instructions (Addendum)
You are doing well. No medication changes were made.  You have a bruit on the right more then the left, We will order a carotid arterial ultrasound  Date & time: ______________________  Repeat echocardiogram in a few years  Consider a CT coronary calcium score, $150 Research on google images  Call if you would like to schedule  Please call us if you have new issues that need to be addressed before your next appt.  Your physician wants you to follow-up in: 12 months.  You will receive a reminder letter in the mail two months in advance. If you don't receive a letter, please call our office to schedule the follow-up appointment.  Coronary Calcium Scan A coronary calcium scan is an imaging test used to look for deposits of calcium and other fatty materials (plaques) in the inner lining of the blood vessels of your heart (coronary arteries). These deposits of calcium and plaques can partly clog and narrow the coronary arteries without producing any symptoms or warning signs. This puts you at risk for a heart attack. This test can detect these deposits before symptoms develop.  LET [facility] CARE PROVIDER KNOW ABOUT:  Any allergies you have.  All medicines you are taking, including vitamins, herbs, eye drops, creams, and over-the-counter medicines.  Previous problems you or members of your family have had with the use of anesthetics.  Any blood disorders you have.  Previous surgeries you have had.  Medical conditions you have.  Possibility of pregnancy, if this applies. RISKS AND COMPLICATIONS Generally, this is a safe procedure. However, as with any procedure, complications can occur. This test involves the use of radiation. Radiation exposure can be dangerous to a pregnant woman and her unborn baby. If you are pregnant, you should not have this procedure done.  BEFORE THE PROCEDURE There is no special preparation for the procedure. PROCEDURE  You will need to undress and put  on a hospital gown. You will need to remove any jewelry around your neck or chest.  Sticky electrodes are placed on your chest and are connected to an electrocardiogram (EKG or electrocardiography) machine to recorda tracing of the electrical activity of your heart.  A CT scanner will take pictures of your heart. During this time, you will be asked to lie still and hold your breath for 2-3 seconds while a picture is being taken of your heart. AFTER THE PROCEDURE   You will be allowed to get dressed.  You can return to your normal activities after the scan is done.   This information is not intended to replace advice given to you by your health care provider. Make sure you discuss any questions you have with your health care provider.   Document Released: 06/26/2007 Document Revised: 01/02/2013 Document Reviewed: 09/04/2012 Elsevier Interactive Patient Education [employer].

## 2015-02-19 ENCOUNTER — Other Ambulatory Visit: Payer: Self-pay | Admitting: Cardiovascular Disease

## 2015-02-19 DIAGNOSIS — R3914 Feeling of incomplete bladder emptying: Secondary | ICD-10-CM | POA: Diagnosis not present

## 2015-02-19 DIAGNOSIS — R350 Frequency of micturition: Secondary | ICD-10-CM | POA: Diagnosis not present

## 2015-02-19 DIAGNOSIS — N401 Enlarged prostate with lower urinary tract symptoms: Secondary | ICD-10-CM | POA: Diagnosis not present

## 2015-02-19 DIAGNOSIS — R3916 Straining to void: Secondary | ICD-10-CM | POA: Diagnosis not present

## 2015-02-19 DIAGNOSIS — N5201 Erectile dysfunction due to arterial insufficiency: Secondary | ICD-10-CM | POA: Diagnosis not present

## 2015-02-19 DIAGNOSIS — R011 Cardiac murmur, unspecified: Secondary | ICD-10-CM

## 2015-02-19 DIAGNOSIS — R0989 Other specified symptoms and signs involving the circulatory and respiratory systems: Secondary | ICD-10-CM

## 2015-02-19 DIAGNOSIS — R351 Nocturia: Secondary | ICD-10-CM | POA: Diagnosis not present

## 2015-02-19 DIAGNOSIS — R42 Dizziness and giddiness: Secondary | ICD-10-CM

## 2015-02-19 DIAGNOSIS — R972 Elevated prostate specific antigen [PSA]: Secondary | ICD-10-CM | POA: Diagnosis not present

## 2015-02-19 DIAGNOSIS — R300 Dysuria: Secondary | ICD-10-CM | POA: Diagnosis not present

## 2015-02-20 ENCOUNTER — Other Ambulatory Visit: Payer: Self-pay | Admitting: Family Medicine

## 2015-02-20 DIAGNOSIS — Z125 Encounter for screening for malignant neoplasm of prostate: Secondary | ICD-10-CM

## 2015-02-20 DIAGNOSIS — I10 Essential (primary) hypertension: Secondary | ICD-10-CM

## 2015-02-21 ENCOUNTER — Other Ambulatory Visit (INDEPENDENT_AMBULATORY_CARE_PROVIDER_SITE_OTHER): Payer: Medicare Other

## 2015-02-21 DIAGNOSIS — I10 Essential (primary) hypertension: Secondary | ICD-10-CM

## 2015-02-21 DIAGNOSIS — Z125 Encounter for screening for malignant neoplasm of prostate: Secondary | ICD-10-CM | POA: Diagnosis not present

## 2015-02-21 LAB — PSA: PSA: 9.61 ng/mL — ABNORMAL HIGH (ref 0.10–4.00)

## 2015-02-21 LAB — COMPREHENSIVE METABOLIC PANEL
ALT: 12 U/L (ref 0–53)
AST: 17 U/L (ref 0–37)
Albumin: 4.1 g/dL (ref 3.5–5.2)
Alkaline Phosphatase: 46 U/L (ref 39–117)
BUN: 17 mg/dL (ref 6–23)
CO2: 30 mEq/L (ref 19–32)
Calcium: 9.8 mg/dL (ref 8.4–10.5)
Chloride: 102 mEq/L (ref 96–112)
Creatinine, Ser: 1.02 mg/dL (ref 0.40–1.50)
GFR: 76.92 mL/min (ref >60.00)
Glucose, Bld: 104 mg/dL — ABNORMAL HIGH (ref 70–99)
Potassium: 4.4 mEq/L (ref 3.5–5.1)
Sodium: 137 mEq/L (ref 135–145)
Total Bilirubin: 0.6 mg/dL (ref 0.2–1.2)
Total Protein: 6.9 g/dL (ref 6.0–8.3)

## 2015-02-21 LAB — LIPID PANEL
Cholesterol: 206 mg/dL — ABNORMAL HIGH (ref 0–200)
HDL: 62.2 mg/dL (ref >39.00)
LDL Cholesterol: 128 mg/dL — ABNORMAL HIGH (ref 0–99)
NonHDL: 143.76
Total CHOL/HDL Ratio: 3
Triglycerides: 79 mg/dL (ref 0.0–149.0)
VLDL: 15.8 mg/dL (ref 0.0–40.0)

## 2015-02-24 ENCOUNTER — Ambulatory Visit (INDEPENDENT_AMBULATORY_CARE_PROVIDER_SITE_OTHER): Payer: Medicare Other | Admitting: Family Medicine

## 2015-02-24 ENCOUNTER — Encounter: Payer: Self-pay | Admitting: Family Medicine

## 2015-02-24 VITALS — BP 126/58 | HR 64 | Temp 98.3°F | Ht 69.0 in | Wt 161.8 lb

## 2015-02-24 DIAGNOSIS — Z119 Encounter for screening for infectious and parasitic diseases, unspecified: Secondary | ICD-10-CM | POA: Diagnosis not present

## 2015-02-24 DIAGNOSIS — I10 Essential (primary) hypertension: Secondary | ICD-10-CM | POA: Diagnosis not present

## 2015-02-24 DIAGNOSIS — Z23 Encounter for immunization: Secondary | ICD-10-CM | POA: Diagnosis not present

## 2015-02-24 DIAGNOSIS — Z0000 Encounter for general adult medical examination without abnormal findings: Secondary | ICD-10-CM | POA: Diagnosis not present

## 2015-02-24 DIAGNOSIS — N418 Other inflammatory diseases of prostate: Secondary | ICD-10-CM | POA: Diagnosis not present

## 2015-02-24 DIAGNOSIS — R42 Dizziness and giddiness: Secondary | ICD-10-CM

## 2015-02-24 DIAGNOSIS — Z7189 Other specified counseling: Secondary | ICD-10-CM

## 2015-02-24 MED ORDER — AMLODIPINE BESYLATE 10 MG PO TABS: 10.0000 mg | ORAL_TABLET | Freq: Every day | ORAL | Status: DC

## 2015-02-24 NOTE — Progress Notes (Signed)
Pre visit review using our clinic review tool, if applicable. No additional management support is needed unless otherwise documented below in the visit note.  I have personally reviewed the Medicare Annual Wellness questionnaire and have noted 1. The patient's medical and social history 2. Their use of alcohol, tobacco or illicit drugs 3. Their current medications and supplements 4. The patient's functional ability including ADL's, fall risks, home safety risks and hearing or visual             impairment. 5. Diet and physical activities 6. Evidence for depression or mood disorders  The patients weight, height, BMI have been recorded in the chart and visual acuity is per eye clinic.  I have made referrals, counseling and provided education to the patient based review of the above and I have provided the pt with a written personalized care plan for preventive services.  Provider list updated- see scanned forms.  Routine anticipatory guidance given to patient.  See health maintenance.  Flu 2016 Shingles 08/2013 out of clinic.  PNA 2014 Tetanus 2016 Colonoscopy 2014 PSA deferred to uro- recent PSA elevation noted but concurrent UTI tx likely elevates/invalidates PSA, d/w pt.  Advance directive- wife designated if patient were incapacitated.  Cognitive function addressed- see scanned forms- and if abnormal then additional documentation follows.   He saw uro last week re: dysuria.  dx'd with UTI.  D/w pt about PSA elevation at OV today.  Dysuria better now.  Still on abx.  His urologist reported didn't do the PSA at the last OV.  I didn't know about his sx at the time of the lab draw.  He has f/u with uro pending with plan for repeat PSA there after treatment.   He had episode of vertigo. Came on quickly.  Felt the room spinning.  Laid down and felt better.  Prev note from other MD reviewed.  No sx now.   Hypertension:  Using medication without problems or lightheadedness: yes Chest pain  with exertion:no Edema:no Short of breath:no  PMH and SH reviewed  Meds, vitals, and allergies reviewed.   ROS: See HPI.  Otherwise negative.    GEN: nad, alert and oriented HEENT: mucous membranes moist NECK: supple w/o LA CV: rrr. PULM: ctab, no inc wob ABD: soft, +bs EXT: no edema SKIN: no acute rash DRE deferred.

## 2015-02-24 NOTE — Patient Instructions (Addendum)
Please get me a copy of your last colonoscopy report.   Please ask urology to send your next office visit note.   If you BP is low, then update me so we can adjust your medicine.   Take care.  Glad to see you.

## 2015-02-25 DIAGNOSIS — Z7189 Other specified counseling: Secondary | ICD-10-CM | POA: Insufficient documentation

## 2015-02-25 DIAGNOSIS — Z0000 Encounter for general adult medical examination without abnormal findings: Secondary | ICD-10-CM | POA: Insufficient documentation

## 2015-02-25 NOTE — Assessment & Plan Note (Addendum)
Flu 2016 Shingles 08/2013 out of clinic.  PNA 2014, updated 2017 Tetanus 2016 Colonoscopy 2014 PSA deferred to uro- recent PSA elevation noted but concurrent UTI tx likely elevates/invalidates PSA, d/w pt.  Advance directive- wife designated if patient were incapacitated.  Cognitive function addressed- see scanned forms- and if abnormal then additional documentation follows.  Pt opts in for HCV screening.  D/w pt re: routine screening.

## 2015-02-25 NOTE — Assessment & Plan Note (Signed)
Will defer repeat PSA to uro.  Patient agrees. He'll specifically talk to uro about this.

## 2015-02-25 NOTE — Assessment & Plan Note (Signed)
Advance directive- wife designated if patient were incapacitated.  

## 2015-02-25 NOTE — Assessment & Plan Note (Signed)
Controlled, continue as is.  D/w pt.  He agrees. Lipids okay for now, d/w pt, he may need statin depending on carotid results.

## 2015-02-25 NOTE — Assessment & Plan Note (Signed)
Resolved

## 2015-02-26 ENCOUNTER — Ambulatory Visit: Payer: Medicare Other

## 2015-02-26 DIAGNOSIS — R42 Dizziness and giddiness: Secondary | ICD-10-CM | POA: Diagnosis not present

## 2015-02-26 DIAGNOSIS — R0989 Other specified symptoms and signs involving the circulatory and respiratory systems: Secondary | ICD-10-CM | POA: Diagnosis not present

## 2015-02-26 DIAGNOSIS — R011 Cardiac murmur, unspecified: Secondary | ICD-10-CM

## 2015-03-14 ENCOUNTER — Encounter: Payer: Self-pay | Admitting: Family Medicine

## 2015-03-25 DIAGNOSIS — N401 Enlarged prostate with lower urinary tract symptoms: Secondary | ICD-10-CM | POA: Diagnosis not present

## 2015-03-25 DIAGNOSIS — Z125 Encounter for screening for malignant neoplasm of prostate: Secondary | ICD-10-CM | POA: Diagnosis not present

## 2015-03-31 DIAGNOSIS — R9721 Rising PSA following treatment for malignant neoplasm of prostate: Secondary | ICD-10-CM | POA: Diagnosis not present

## 2015-03-31 DIAGNOSIS — R972 Elevated prostate specific antigen [PSA]: Secondary | ICD-10-CM | POA: Diagnosis not present

## 2015-04-11 DIAGNOSIS — R972 Elevated prostate specific antigen [PSA]: Secondary | ICD-10-CM | POA: Diagnosis not present

## 2015-04-11 DIAGNOSIS — N401 Enlarged prostate with lower urinary tract symptoms: Secondary | ICD-10-CM | POA: Diagnosis not present

## 2015-04-12 IMAGING — CR DG CHEST 2V
2 series · 2 of 2 positions shown · non-contrast
Comparison: 11/03/2005

CLINICAL DATA: Cough. Former smoker. Personal history of right lung
adenocarcinoma.

EXAM:
CHEST  2 VIEW

[view not recorded (1 of 2)]
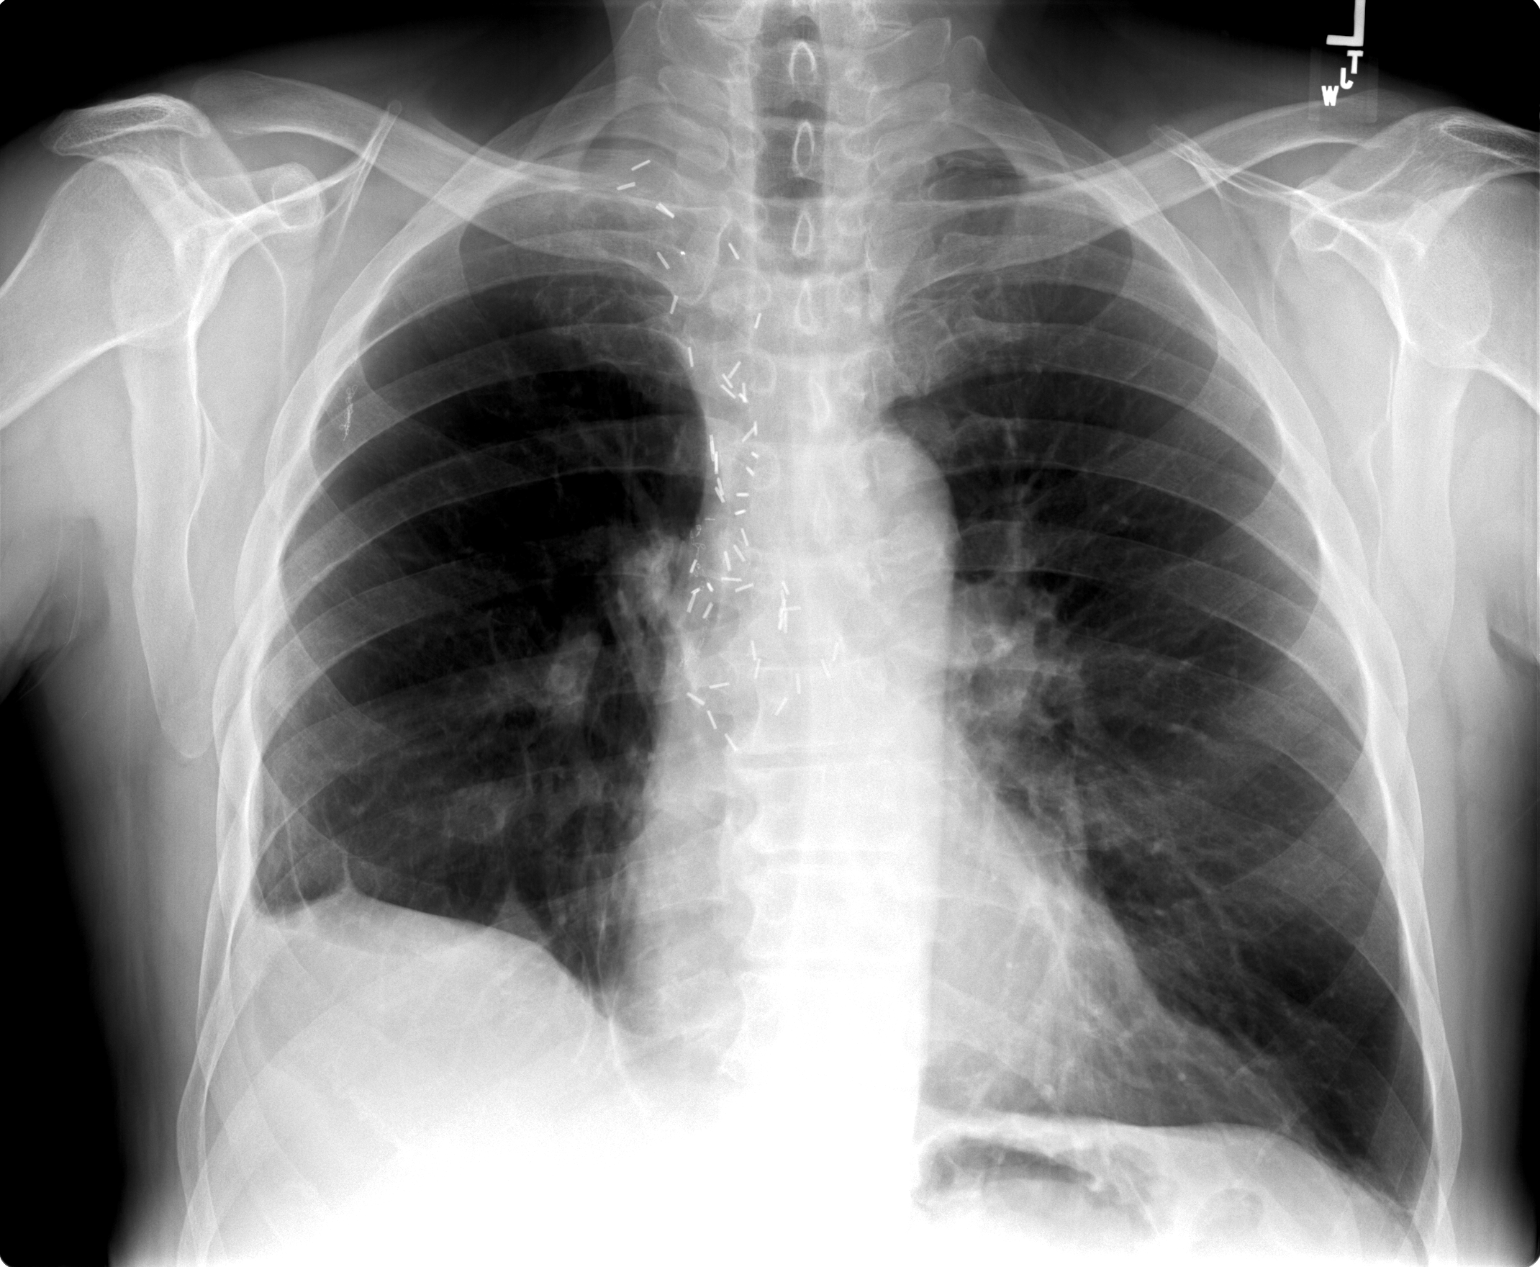

[view not recorded (2 of 2)]
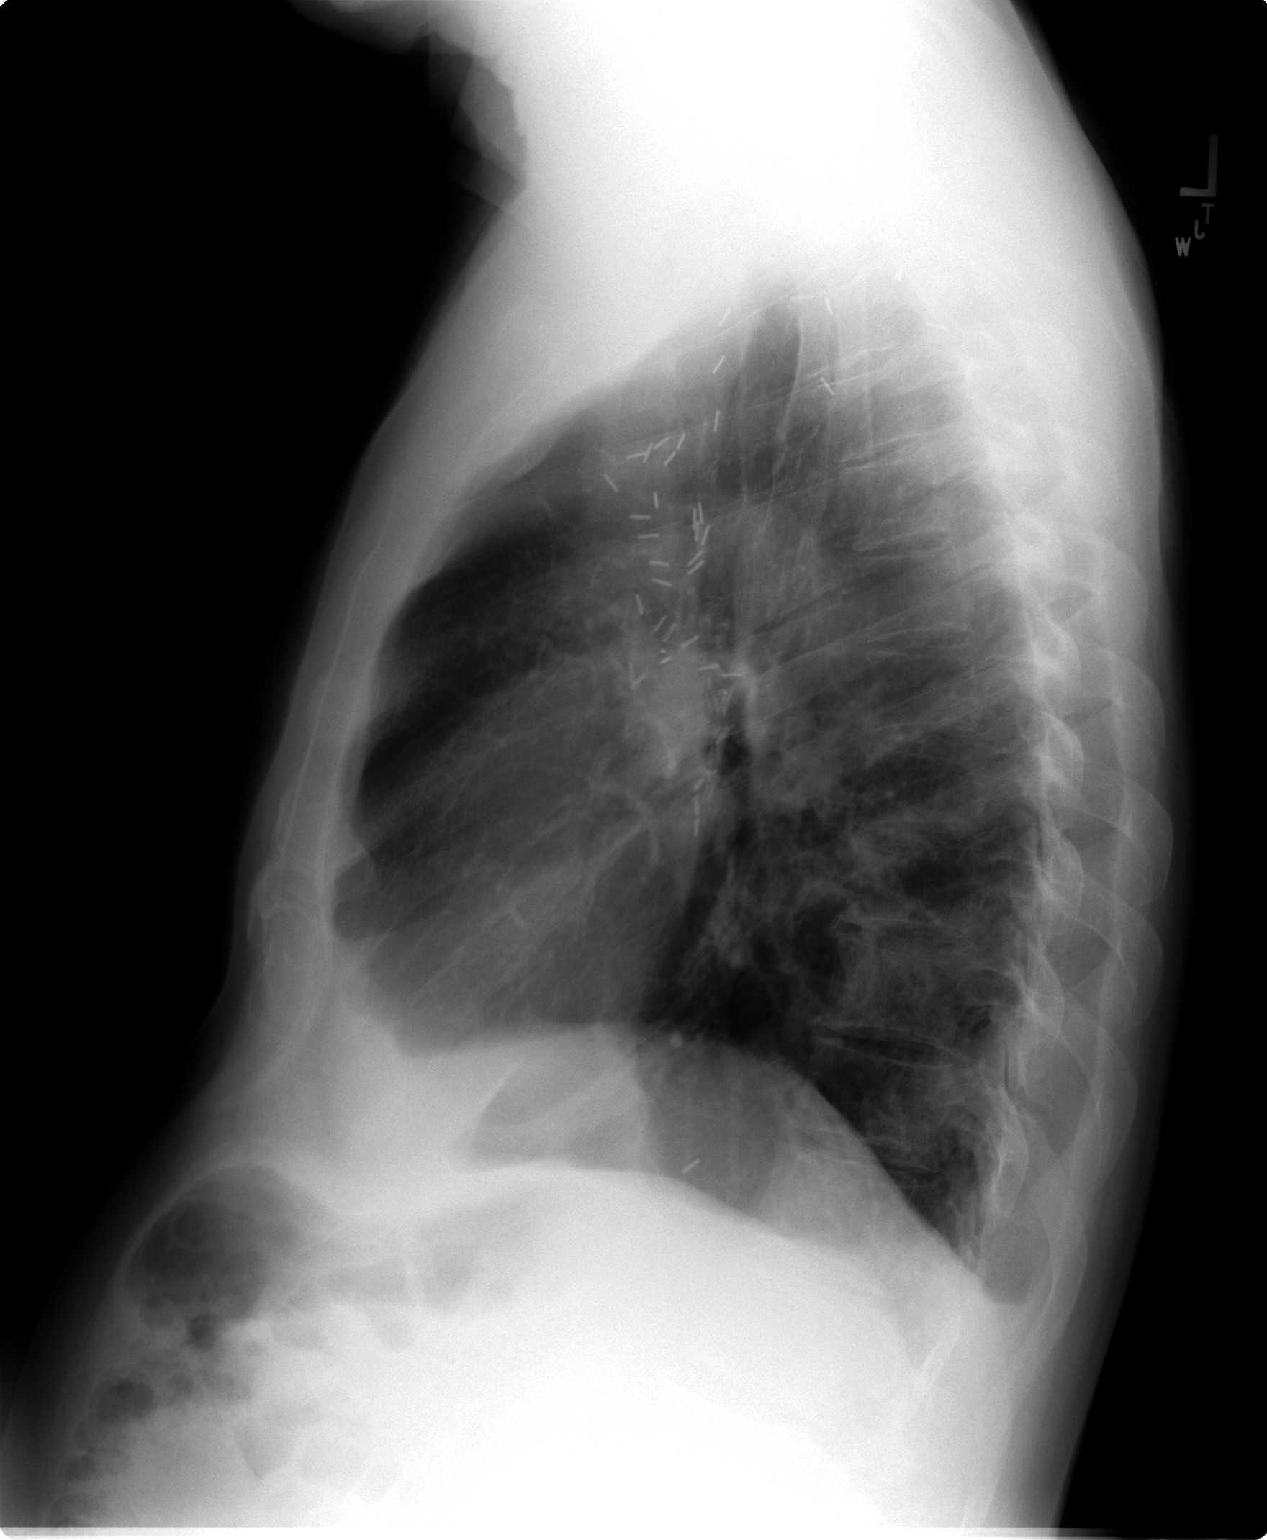

[2 of 2 positions shown; findings below may reference images not displayed]

FINDINGS: Heart size remains normal. Postsurgical changes in the right
hemithorax are stable in appearance. No evidence of pulmonary
infiltrate or pleural effusion. No mass or lymphadenopathy
identified.
IMPRESSION: Stable postsurgical changes in right hemithorax.  No active disease.

## 2015-04-27 IMAGING — CT CT HEAD WITHOUT CONTRAST
1 series · 16 of 29 positions shown, 20 images · non-contrast
Comparison: None.

CLINICAL DATA: Headache, dizziness

EXAM:
CT HEAD WITHOUT CONTRAST
TECHNIQUE: Contiguous axial images were obtained from the base of the skull
through the vertex without intravenous contrast.

[Series 2: soft tissue · axial · 0.42mm/px · z∈[+402,+532]mm · 16 of 29 slices shown, 20 images]
[im 2/29  brain]
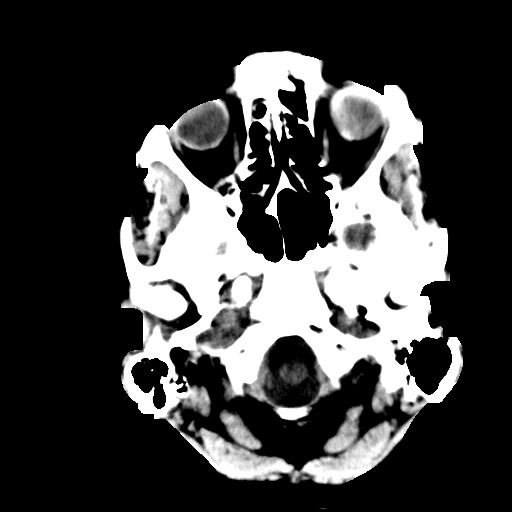
[im 2/29  bone]
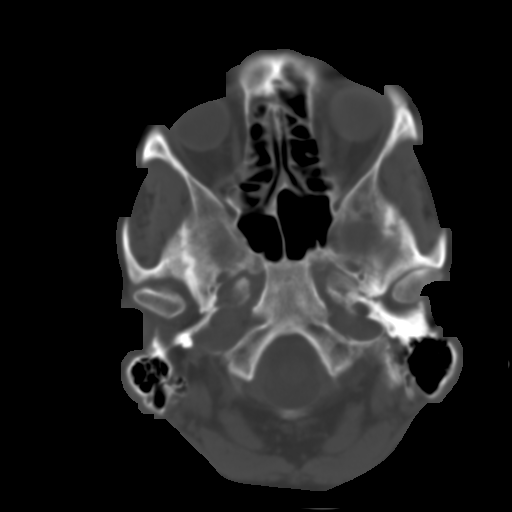
[im 4/29  brain]
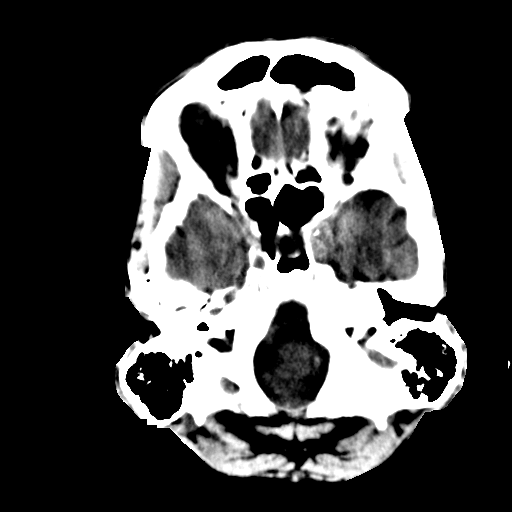
[im 6/29  brain]
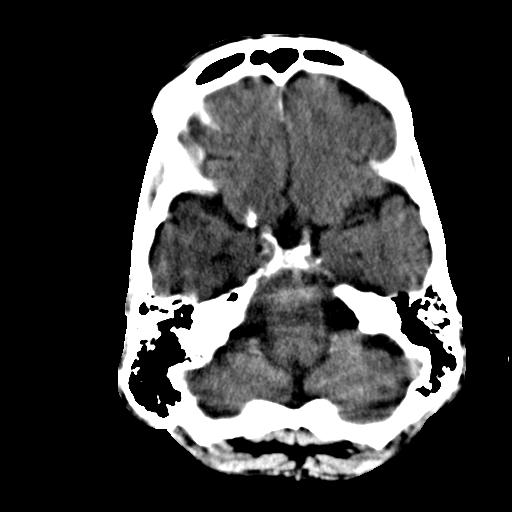
[im 7/29  brain]
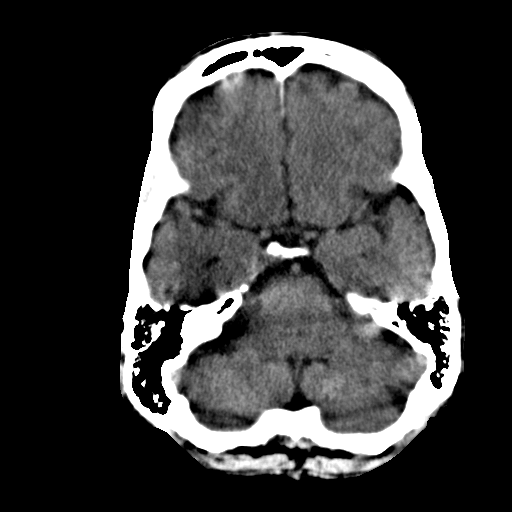
[im 9/29  brain]
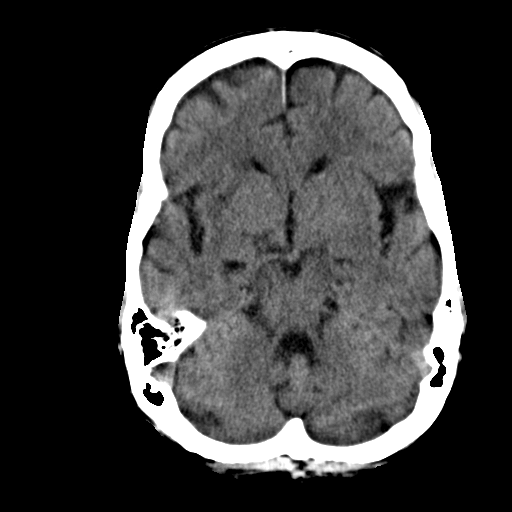
[im 9/29  bone]
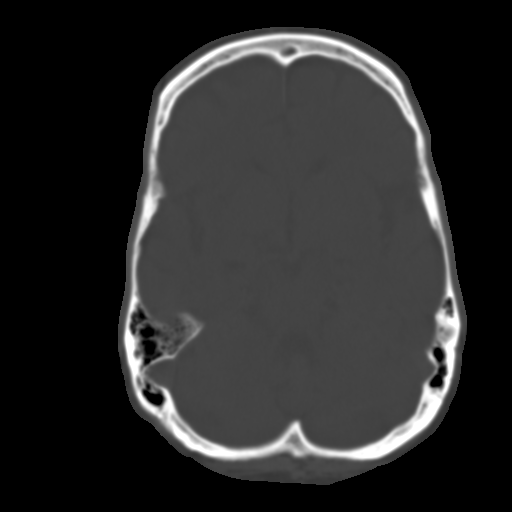
[im 11/29  brain]
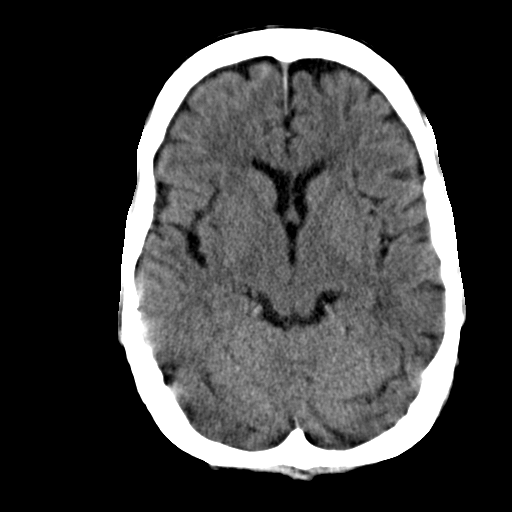
[im 12/29  brain]
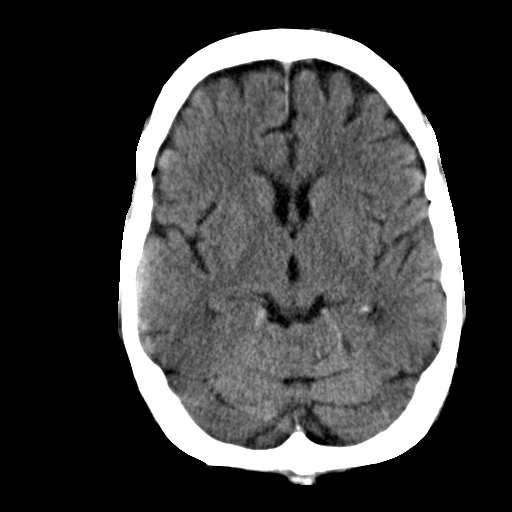
[im 14/29  brain]
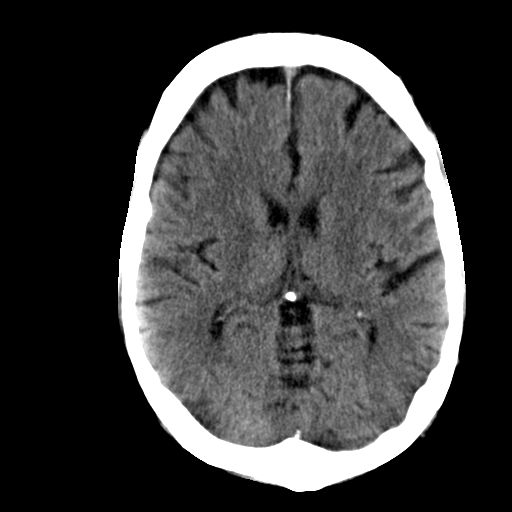
[im 16/29  brain]
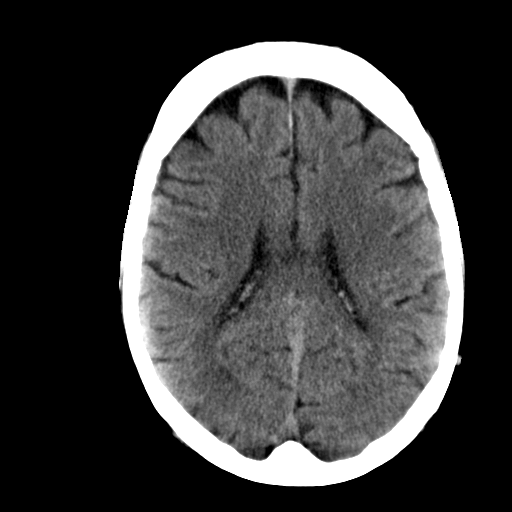
[im 16/29  bone]
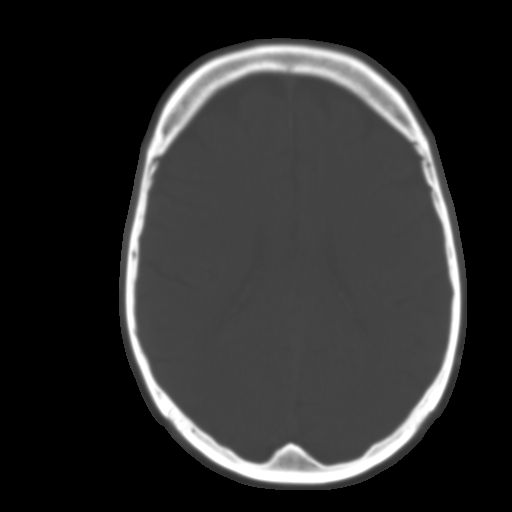
[im 18/29  brain]
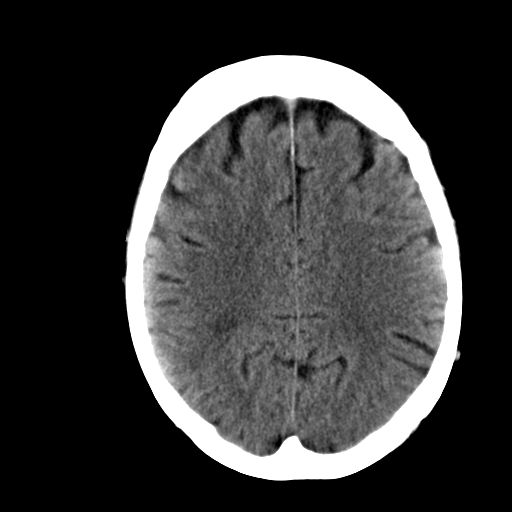
[im 19/29  brain]
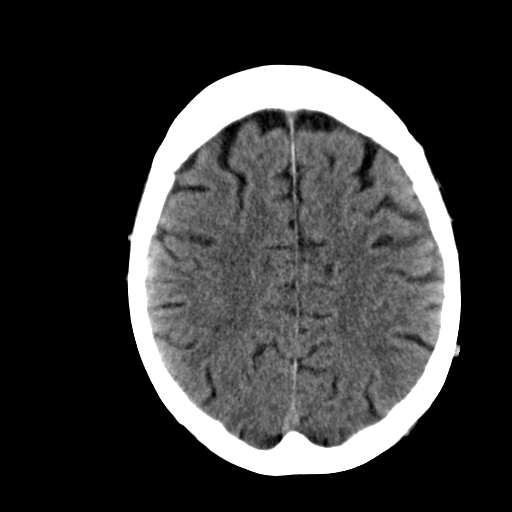
[im 21/29  brain]
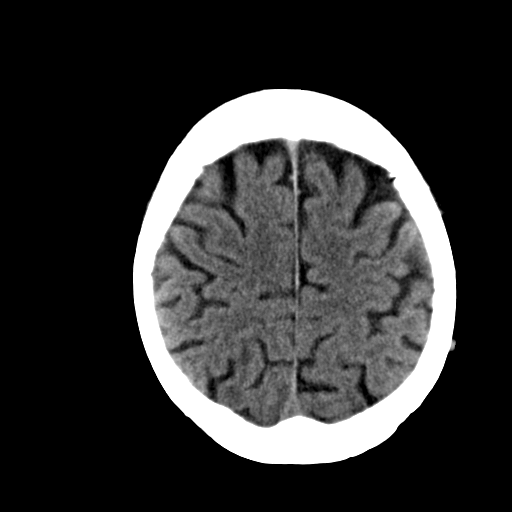
[im 23/29  brain]
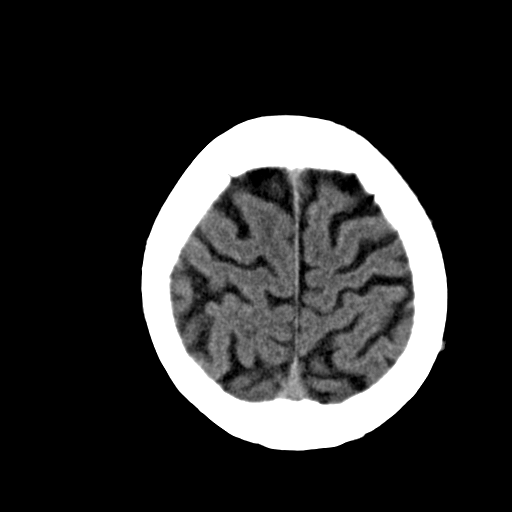
[im 23/29  bone]
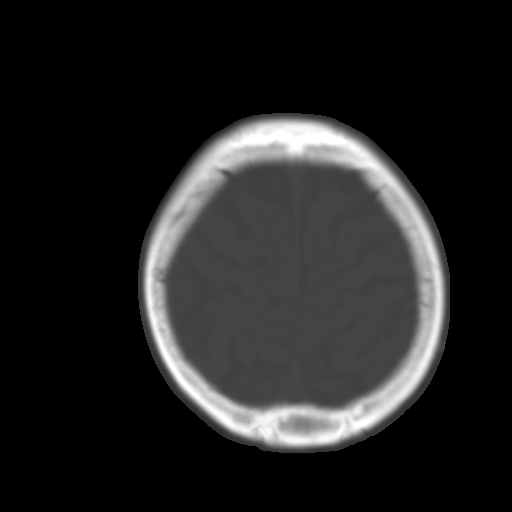
[im 24/29  brain]
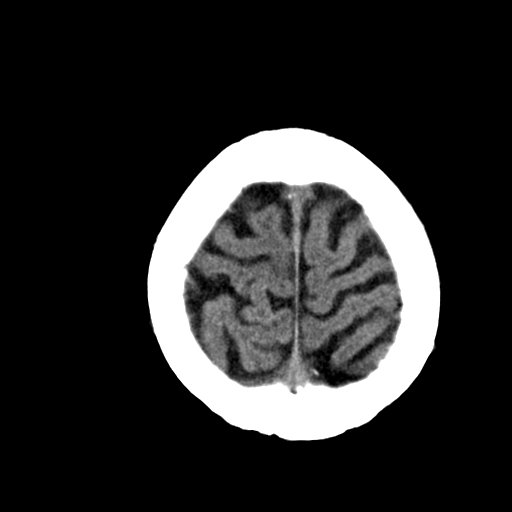
[im 26/29  brain]
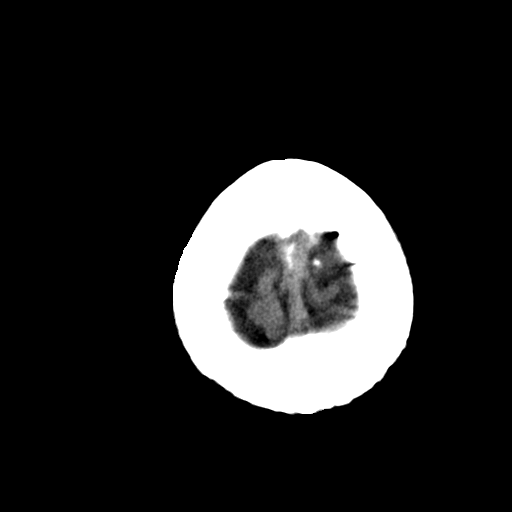
[im 28/29  brain]
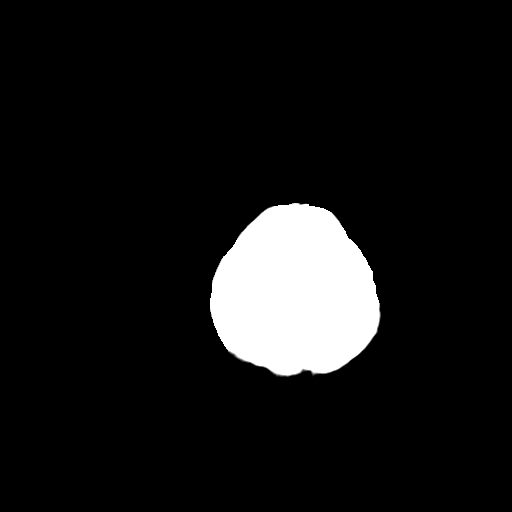

[16 of 29 positions shown; findings below may reference images not displayed]

FINDINGS: No skull fracture is noted. Paranasal sinuses and mastoid air cells
are unremarkable. No intracranial hemorrhage, mass effect or midline
shift.

No acute infarction. No mass lesion is noted on this unenhanced
scan. The gray and white-matter differentiation is preserved. Mild
cerebral atrophy. Mild periventricular white matter decreased
attenuation probable due to chronic small vessel ischemic changes.
IMPRESSION: No acute intracranial abnormality. Mild periventricular white matter
decreased attenuation probable due to chronic small vessel ischemic
changes. No definite acute cortical infarction.

## 2015-05-29 ENCOUNTER — Other Ambulatory Visit: Payer: Self-pay | Admitting: *Deleted

## 2015-06-24 ENCOUNTER — Telehealth: Payer: Self-pay

## 2015-06-24 NOTE — Telephone Encounter (Signed)
Okay to continue.  Thanks.

## 2015-06-24 NOTE — Telephone Encounter (Signed)
Pt left v/m; pt has been taking OTC K gluconate 595 mg daily. Pt last annual 02/24/15; 02/21/15 lab K was 4.4. Pt wants to know if OK pt continues to take med.

## 2015-06-24 NOTE — Telephone Encounter (Signed)
Patient advised.

## 2015-08-05 ENCOUNTER — Encounter: Payer: Self-pay | Admitting: Emergency Medicine

## 2015-08-05 ENCOUNTER — Emergency Department
Admission: EM | Admit: 2015-08-05 | Discharge: 2015-08-06 | Disposition: A | Discharge status: Home / Self Care | Payer: Medicare Other | Attending: Emergency Medicine | Admitting: Emergency Medicine

## 2015-08-05 DIAGNOSIS — Z79899 Other long term (current) drug therapy: Secondary | ICD-10-CM | POA: Diagnosis not present

## 2015-08-05 DIAGNOSIS — W01198A Fall on same level from slipping, tripping and stumbling with subsequent striking against other object, initial encounter: Secondary | ICD-10-CM | POA: Diagnosis not present

## 2015-08-05 DIAGNOSIS — Z85118 Personal history of other malignant neoplasm of bronchus and lung: Secondary | ICD-10-CM | POA: Insufficient documentation

## 2015-08-05 DIAGNOSIS — E785 Hyperlipidemia, unspecified: Secondary | ICD-10-CM | POA: Insufficient documentation

## 2015-08-05 DIAGNOSIS — Y9301 Activity, walking, marching and hiking: Secondary | ICD-10-CM | POA: Insufficient documentation

## 2015-08-05 DIAGNOSIS — Y999 Unspecified external cause status: Secondary | ICD-10-CM | POA: Diagnosis not present

## 2015-08-05 DIAGNOSIS — Y929 Unspecified place or not applicable: Secondary | ICD-10-CM | POA: Insufficient documentation

## 2015-08-05 DIAGNOSIS — S0181XA Laceration without foreign body of other part of head, initial encounter: Secondary | ICD-10-CM

## 2015-08-05 DIAGNOSIS — S0990XA Unspecified injury of head, initial encounter: Secondary | ICD-10-CM | POA: Insufficient documentation

## 2015-08-05 DIAGNOSIS — I10 Essential (primary) hypertension: Secondary | ICD-10-CM | POA: Diagnosis not present

## 2015-08-05 DIAGNOSIS — Z87891 Personal history of nicotine dependence: Secondary | ICD-10-CM | POA: Insufficient documentation

## 2015-08-05 DIAGNOSIS — S01111A Laceration without foreign body of right eyelid and periocular area, initial encounter: Secondary | ICD-10-CM | POA: Insufficient documentation

## 2015-08-05 MED ORDER — LIDOCAINE-EPINEPHRINE (PF) 1 %-1:200000 IJ SOLN
30.0000 mL | Freq: Once | INTRAMUSCULAR | Status: AC
  Administered 2015-08-06: 30 mL via INTRADERMAL
  Filled 2015-08-05: qty 30

## 2015-08-05 NOTE — ED Triage Notes (Signed)
Pt in with laceration above right eyebrow tripped over dog and fell against door. No loc pt not on blood thinners.

## 2015-08-05 NOTE — ED Provider Notes (Signed)
[facility] Emergency Department Provider Note ____________________________________________  Time seen: 2231  I have reviewed the triage vital signs and the nursing notes.  HISTORY  Chief Complaint  Laceration  HPI Brett Mcguire is a 70 y.o. male presents to the ED for evaluation of a laceration to the right brow. The patient describes he was walking his dog, when he suddenly tripped and was pulled down to the floor. He fell against a door jam, causing a laceration to the forehead over the brow. He denies any loss of consciousness, nausea, vomiting, dizziness.   Past Medical History:  Diagnosis Date  . Allergy 1989  . 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 Diverticulosis 02/2000  . Hyperlipidemia 1989  . Lung cancer ([place]) 1989   s/p lobectomy, no chemo or rady  . Murmur    Per old records, echo and exercise stress test wnl 2012.  No aortic stenosis on echo.   . Murmur     Patient Active Problem List   Diagnosis Date Noted  . Medicare annual wellness visit, initial 02/25/2015  . Advance care planning 02/25/2015  . Bilateral carotid bruits 02/18/2015  . Aortic valve disease 02/18/2015  . Vertigo 01/28/2015  . Olecranon bursitis 05/16/2014  . Skin tear of forearm without complication 32/44/0102  . Rash and nonspecific skin eruption 03/20/2014  . Essential hypertension 01/01/2014  . Seasonal allergies 05/30/2013  . Systolic murmur 72/53/6644  . Right leg weakness 04/03/2013  . 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 MELANOMA 04/25/2007  . Hyperlipidemia 04/25/2007  . DIVERTICULOSIS, COLON 04/25/2007  . MICROSCOPIC HEMATURIA 04/25/2007  . Prostatitis 04/25/2007  . HEMATOSPERMIA 04/25/2007  . HYPERGLYCEMIA 04/25/2007    Past Surgical History:  Procedure Laterality Date  . COLONOSCOPY  02/18/2000   Polyp, diverticulosis, repeat in 5 years  . COLONOSCOPY  03/03/2006   Repeat  -  Divertics  . Laminectomy/Discectomy  11/03/2005   L5/S1  . Malignant melanoma excision  06/20/2003   Dr. Acquanetta Sit  . MRI  10/09/2005   Lumbar spine, bulging disc L5/S1  . Right lobectomy  1989   Lung cancer  . TONSILLECTOMY AND ADENOIDECTOMY      Current Outpatient Rx  . Order #: [id number] Class: Normal  . Order #: [id number] Class: Historical Med    Allergies Alfuzosin; Cialis [tadalafil]; Doxazosin; Ephedrine; Finasteride; Flomax [tamsulosin hcl]; Rapaflo [silodosin]; and Sulfonamide derivatives  Family History  Problem Relation Age of Onset  . Cancer Mother     [place] throat  . Alcohol abuse Mother   . Cancer Father     4 kinds; 5 places, prostate and lung  . Prostate cancer Father   . Cancer Other     Skin, deceased in [place] History  Substance Use Topics  . Smoking status: Former Smoker    Packs/day: 2.00    Years: 17.00    Types: Cigarettes    Quit date: 02/22/1982  . Smokeless tobacco: Never Used  . Alcohol use 0.0 oz/week     Comment: wine    Review of Systems  Constitutional: Negative for fever. Eyes: Negative for visual changes. ENT: Negative for sore throat. Skin: Negative for rash. Facial laceration as above.  Neurological: Negative for focal weakness or numbness. Reports mild headache. No LOC.  ____________________________________________  PHYSICAL EXAM:  VITAL SIGNS: ED Triage Vitals [08/05/15 2215]  Enc Vitals Group     BP (!) 152/73     Pulse Rate 78  Resp 18     Temp 97.6 F (36.4 C)     Temp Source Oral     SpO2 100 %     Weight 154 lb (69.9 kg)     Height '5\' 9"'$  (1.753 m)     Head Circumference      Peak Flow      Pain Score 7     Pain Loc      Pain Edu?      Excl. in [place]?    Constitutional: Alert and oriented. Well appearing and in no distress. Head: Normocephalic and atraumatic, except for a deep laceration overlying the left brow and forehead.      Eyes: Conjunctivae are normal. PERRL. Normal extraocular movements      Ears: Canals clear. TMs intact  bilaterally.   Nose: No congestion/rhinorrhea.   Mouth/Throat: Mucous membranes are moist.   Neck: Supple. No thyromegaly.  Cardiovascular: Normal rate, regular rhythm.  Respiratory: Normal respiratory effort. No wheezes/rales/rhonchi. Musculoskeletal: Nontender with normal range of motion in all extremities.  Neurologic:  Normal gait without ataxia. Normal speech and language. No gross focal neurologic deficits are appreciated. Skin:  Skin is warm, dry and intact. No rash noted. Psychiatric: Mood and affect are normal. Patient exhibits appropriate insight and judgment. ____________________________________________  PROCEDURES  LACERATION REPAIR Performed by: Melvenia Needles Authorized by: Melvenia Needles Consent: Verbal consent obtained. Risks and benefits: risks, benefits and alternatives were discussed Consent given by: patient Patient identity confirmed: provided demographic data Prepped and Draped in normal sterile fashion Wound explored  Laceration Location: right brow/forehead  Laceration Length: 4.25 cm  No Foreign Bodies seen or palpated  Anesthesia: regional infiltration: Trigiminal nerve V1   Local anesthetic: lidocaine 1% w/ epinephrine  Anesthetic total: 3 ml  Irrigation method: syringe Amount of cleaning: standard  Skin closure: 5-0 Vicryl + cyanoacrylate wound adhesive  Number of sutures: 1    Technique: running subcutaneous + superficial wound adhesive  Patient tolerance: Patient tolerated the procedure well with no immediate complications. ____________________________________________  INITIAL IMPRESSION / ASSESSMENT AND PLAN / ED COURSE  Patient with a facial contusion and minor head injury without loss of consciousness. He sustained a facial laceration status post suture and wound glue repair in the ED today. He also has some mild concussive symptoms. His exam is otherwise benign and he is discharged with head injury  precautions and return instructions.  Clinical Course   ____________________________________________  FINAL CLINICAL IMPRESSION(S) / ED DIAGNOSES  Final diagnoses:  Facial laceration, initial encounter  Head injury, initial encounter     Melvenia Needles, PA-C 08/06/15 0013    Hinda Kehr, MD 08/06/15 [phone]

## 2015-08-05 NOTE — Discharge Instructions (Signed)
Keep the wound clean and dry. Avoid using cream, oils, lotions, or ointments over the glued wound. Take Advil or Naproxen as needed for pain relief. Return to the ED for worsening headache, nausea, vomiting, or dizziness.

## 2015-08-06 DIAGNOSIS — Z85828 Personal history of other malignant neoplasm of skin: Secondary | ICD-10-CM | POA: Diagnosis not present

## 2015-08-06 DIAGNOSIS — S01111A Laceration without foreign body of right eyelid and periocular area, initial encounter: Secondary | ICD-10-CM | POA: Diagnosis not present

## 2015-08-06 DIAGNOSIS — D485 Neoplasm of uncertain behavior of skin: Secondary | ICD-10-CM | POA: Diagnosis not present

## 2015-08-06 DIAGNOSIS — Z85820 Personal history of malignant melanoma of skin: Secondary | ICD-10-CM | POA: Diagnosis not present

## 2015-08-06 DIAGNOSIS — T07 Unspecified multiple injuries: Secondary | ICD-10-CM | POA: Diagnosis not present

## 2015-08-06 DIAGNOSIS — D229 Melanocytic nevi, unspecified: Secondary | ICD-10-CM | POA: Diagnosis not present

## 2015-08-06 DIAGNOSIS — D179 Benign lipomatous neoplasm, unspecified: Secondary | ICD-10-CM | POA: Diagnosis not present

## 2015-08-06 DIAGNOSIS — Z1283 Encounter for screening for malignant neoplasm of skin: Secondary | ICD-10-CM | POA: Diagnosis not present

## 2015-08-06 DIAGNOSIS — B078 Other viral warts: Secondary | ICD-10-CM | POA: Diagnosis not present

## 2015-08-06 DIAGNOSIS — L820 Inflamed seborrheic keratosis: Secondary | ICD-10-CM | POA: Diagnosis not present

## 2015-08-06 DIAGNOSIS — L821 Other seborrheic keratosis: Secondary | ICD-10-CM | POA: Diagnosis not present

## 2015-09-09 ENCOUNTER — Other Ambulatory Visit: Payer: Self-pay

## 2015-09-18 ENCOUNTER — Other Ambulatory Visit: Payer: Self-pay | Admitting: Family Medicine

## 2015-09-18 ENCOUNTER — Other Ambulatory Visit: Payer: Self-pay | Admitting: *Deleted

## 2015-09-18 NOTE — Telephone Encounter (Signed)
Electronic refill request. Medication was not on patient's current meds list but was in the historical file.  Please advise.

## 2015-09-19 MED ORDER — TRIAMCINOLONE 0.1 % CREAM:EUCERIN CREAM 1:1: 1.0000 "application " | TOPICAL_CREAM | Freq: Two times a day (BID) | CUTANEOUS | Status: DC

## 2015-09-19 NOTE — Telephone Encounter (Signed)
Medication phoned to pharmacy.  

## 2015-09-19 NOTE — Telephone Encounter (Signed)
Please call in.  Thanks.   

## 2015-10-06 DIAGNOSIS — N401 Enlarged prostate with lower urinary tract symptoms: Secondary | ICD-10-CM | POA: Diagnosis not present

## 2015-10-06 DIAGNOSIS — R351 Nocturia: Secondary | ICD-10-CM | POA: Diagnosis not present

## 2015-10-06 DIAGNOSIS — N5201 Erectile dysfunction due to arterial insufficiency: Secondary | ICD-10-CM | POA: Diagnosis not present

## 2015-10-06 DIAGNOSIS — R972 Elevated prostate specific antigen [PSA]: Secondary | ICD-10-CM | POA: Diagnosis not present

## 2015-10-06 DIAGNOSIS — R350 Frequency of micturition: Secondary | ICD-10-CM | POA: Diagnosis not present

## 2015-10-06 DIAGNOSIS — R300 Dysuria: Secondary | ICD-10-CM | POA: Diagnosis not present

## 2015-10-06 DIAGNOSIS — R3916 Straining to void: Secondary | ICD-10-CM | POA: Diagnosis not present

## 2015-10-06 DIAGNOSIS — R3914 Feeling of incomplete bladder emptying: Secondary | ICD-10-CM | POA: Diagnosis not present

## 2015-10-13 ENCOUNTER — Other Ambulatory Visit (HOSPITAL_COMMUNITY): Payer: Self-pay | Admitting: Urology

## 2015-10-13 ENCOUNTER — Other Ambulatory Visit: Payer: Self-pay | Admitting: Urology

## 2015-10-13 DIAGNOSIS — R972 Elevated prostate specific antigen [PSA]: Secondary | ICD-10-CM

## 2015-10-21 ENCOUNTER — Ambulatory Visit (HOSPITAL_COMMUNITY)
Admission: RE | Admit: 2015-10-21 | Discharge: 2015-10-21 | Disposition: A | Discharge status: Home / Self Care | Payer: Medicare Other | Source: Ambulatory Visit | Attending: Urology | Admitting: Urology

## 2015-10-21 DIAGNOSIS — N400 Enlarged prostate without lower urinary tract symptoms: Secondary | ICD-10-CM | POA: Diagnosis not present

## 2015-10-21 DIAGNOSIS — R972 Elevated prostate specific antigen [PSA]: Secondary | ICD-10-CM | POA: Insufficient documentation

## 2015-10-21 DIAGNOSIS — N3289 Other specified disorders of bladder: Secondary | ICD-10-CM | POA: Insufficient documentation

## 2015-10-21 LAB — POCT I-STAT CREATININE: Creatinine, Ser: 0.8 mg/dL (ref 0.61–1.24)

## 2015-10-21 MED ORDER — GADOBENATE DIMEGLUMINE 529 MG/ML IV SOLN
15.0000 mL | Freq: Once | INTRAVENOUS | Status: AC | PRN
  Administered 2015-10-21: 15 mL via INTRAVENOUS

## 2015-10-24 DIAGNOSIS — R350 Frequency of micturition: Secondary | ICD-10-CM | POA: Diagnosis not present

## 2015-10-24 DIAGNOSIS — N401 Enlarged prostate with lower urinary tract symptoms: Secondary | ICD-10-CM | POA: Diagnosis not present

## 2015-10-24 DIAGNOSIS — R3914 Feeling of incomplete bladder emptying: Secondary | ICD-10-CM | POA: Diagnosis not present

## 2015-10-24 DIAGNOSIS — R972 Elevated prostate specific antigen [PSA]: Secondary | ICD-10-CM | POA: Diagnosis not present

## 2015-11-28 DIAGNOSIS — N401 Enlarged prostate with lower urinary tract symptoms: Secondary | ICD-10-CM | POA: Diagnosis not present

## 2015-11-28 DIAGNOSIS — R3915 Urgency of urination: Secondary | ICD-10-CM | POA: Diagnosis not present

## 2015-11-28 DIAGNOSIS — R350 Frequency of micturition: Secondary | ICD-10-CM | POA: Diagnosis not present

## 2015-11-28 DIAGNOSIS — R972 Elevated prostate specific antigen [PSA]: Secondary | ICD-10-CM | POA: Diagnosis not present

## 2015-11-28 DIAGNOSIS — R351 Nocturia: Secondary | ICD-10-CM | POA: Diagnosis not present

## 2015-11-28 DIAGNOSIS — R3914 Feeling of incomplete bladder emptying: Secondary | ICD-10-CM | POA: Diagnosis not present

## 2015-12-01 DIAGNOSIS — Z23 Encounter for immunization: Secondary | ICD-10-CM | POA: Diagnosis not present

## 2015-12-16 ENCOUNTER — Encounter: Payer: Self-pay | Admitting: Family Medicine

## 2016-02-27 DIAGNOSIS — N401 Enlarged prostate with lower urinary tract symptoms: Secondary | ICD-10-CM | POA: Diagnosis not present

## 2016-02-27 DIAGNOSIS — R3914 Feeling of incomplete bladder emptying: Secondary | ICD-10-CM | POA: Diagnosis not present

## 2016-02-27 DIAGNOSIS — R3915 Urgency of urination: Secondary | ICD-10-CM | POA: Diagnosis not present

## 2016-02-27 DIAGNOSIS — R3916 Straining to void: Secondary | ICD-10-CM | POA: Diagnosis not present

## 2016-02-27 DIAGNOSIS — R351 Nocturia: Secondary | ICD-10-CM | POA: Diagnosis not present

## 2016-02-27 DIAGNOSIS — R972 Elevated prostate specific antigen [PSA]: Secondary | ICD-10-CM | POA: Diagnosis not present

## 2016-04-12 ENCOUNTER — Other Ambulatory Visit: Payer: Self-pay | Admitting: Family Medicine

## 2016-05-05 NOTE — Progress Notes (Signed)
Cardiology Office Note  Date:  05/07/2016   ID:  Brett Mcguire, DOB 10-27-1945, MRN [id number]  PCP:  Elsie Stain, MD   Chief Complaint  Patient presents with  . other    12 month follow up. Meds reviewed by the pt. verbally. Pt. c/o fatigue easily.     HPI:  Brett Mcguire is a very pleasant 71 year old gentleman with prior history of smoking for 17 years,  17 years of secondhand exposure per the patient,  lung cancer with resection of the right upper lobe in 1989,  quit smoking in 1984,  chronic mild shortness of breath   who presents for follow-up of his aortic valve disease.  Echo in 12/2014 Possibly bicuspid; moderately thickened, mildly calcified leaflets. There was mild to moderate stenosis. There was trivial regurgitation. Peak velocity 311, Peak gradient (S): 39 mm Hg.  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 Denies any chest pain Mild shortness of breath, chronic issue which she attributes to partial lobectomy for lung cancer Otherwise no significant changes in his symptoms  EKG personally reviewed by myself on todays visit Normal sinus rhythm with rate 65 bpm no significant ST or T-wave changes  Other past medical history reviewed Prior severe episode of vertigo one month ago lasting for several minutes No recurrence    PMH:   has a past medical history of Allergy (1989); BCC (basal cell carcinoma of skin); Diverticulosis (02/2000); Hyperlipidemia (1989); Lung cancer ([place]) (1989); Murmur; and Murmur.  PSH:    Past Surgical History:  Procedure Laterality Date  . COLONOSCOPY  02/18/2000   Polyp, diverticulosis, repeat in 5 years  . COLONOSCOPY  03/03/2006   Repeat  -  Divertics  . Laminectomy/Discectomy  11/03/2005   L5/S1  . Malignant melanoma excision  06/20/2003   Dr. Acquanetta Sit  . MRI  10/09/2005   Lumbar spine, bulging disc L5/S1  . Right lobectomy  1989   Lung  cancer  . TONSILLECTOMY AND ADENOIDECTOMY      Current Outpatient Prescriptions  Medication Sig Dispense Refill  . amLODipine (NORVASC) 10 MG tablet TAKE ONE TABLET EVERY DAY 30 tablet 0  . Probiotic Product (ALIGN) 4 MG CAPS Take by mouth daily.    . Triamcinolone Acetonide (TRIAMCINOLONE 0.1 % CREAM : EUCERIN) CREA Apply 1 application topically 2 (two) times daily. 1 each jar   No current facility-administered medications for this visit.      Allergies:   Alfuzosin; Cialis [tadalafil]; Doxazosin; Ephedrine; Finasteride; Flomax [tamsulosin hcl]; Rapaflo [silodosin]; and Sulfonamide derivatives   Social History:  The patient  reports that he quit smoking about 34 years ago. His smoking use included Cigarettes. He has a 34.00 pack-year smoking history. He has never used smokeless tobacco. He reports that he drinks alcohol. He reports that he does not use drugs.   Family History:   family history includes Alcohol abuse in his mother; Cancer in his father, mother, and other; Prostate cancer in his father.    Review of Systems: Review of Systems  Constitutional: Negative.   Respiratory: Negative.   Cardiovascular: Negative.   Gastrointestinal: Negative.   Musculoskeletal: Negative.   Neurological: Negative.   Psychiatric/Behavioral: Negative.   All other systems reviewed and are negative.    PHYSICAL EXAM: VS:  BP 140/72 (BP Location: Left Arm, Patient Position: Sitting, Cuff Size: Normal)   Pulse 64   Ht '5\' 8"'$  (1.727 m)  Wt 162 lb 8 oz (73.7 kg)   BMI 24.71 kg/m  , BMI Body mass index is 24.71 kg/m. GEN: Well nourished, well developed, in no acute distress  HEENT: normal  Neck: no JVD, carotid bruits, or masses Cardiac: RRR; no murmurs, rubs, or gallops,no edema  Respiratory:  clear to auscultation bilaterally, normal work of breathing GI: soft, nontender, nondistended, + BS MS: no deformity or atrophy  Skin: warm and dry, no rash Neuro:  Strength and sensation are  intact Psych: euthymic mood, full affect    Recent Labs: 10/21/2015: Creatinine, Ser 0.80    Lipid Panel Lab Results  Component Value Date   CHOL 206 (H) 02/21/2015   HDL 62.20 02/21/2015   LDLCALC 128 (H) 02/21/2015   TRIG 79.0 02/21/2015      Wt Readings from Last 3 Encounters:  05/07/16 162 lb 8 oz (73.7 kg)  08/05/15 154 lb (69.9 kg)  02/24/15 161 lb 12 oz (73.4 kg)       ASSESSMENT AND PLAN:  Aortic valve disease - Plan: EKG 12-Lead Moderate aortic valve stenosis in 2016 We have offered echocardiogram in follow-up, he has declined at this time Reports he is asymptomatic Recommended he follow-up in one year Also suggested he call us in the office if he has worsening shortness of breath, leg swelling, chest tightness  Essential hypertension - Plan: EKG 12-Lead Blood pressure is well controlled on today's visit. No changes made to the medications.  Mixed hyperlipidemia - Plan: EKG 12-Lead Adequate cholesterol levels, does not want a cholesterol medication Discussed again very screening studies for coronary disease He does not want CT coronary calcium scoring  Carotid arterial stenosis Mild bilateral disease on study in 2017, less than 39% Repeat ultrasound only as needed  He feels comfortable with his cholesterol at 200 Does not want any other screening test  Shortness of breath Long history of smoking, prior lung cancer  Disposition:   F/U  12 months   Orders Placed This Encounter  Procedures  . EKG 12-Lead     Signed, Esmond Plants, M.D., Ph.D. 05/07/2016  [place], [place]

## 2016-05-07 ENCOUNTER — Ambulatory Visit (INDEPENDENT_AMBULATORY_CARE_PROVIDER_SITE_OTHER): Payer: Medicare Other | Admitting: Cardiovascular Disease

## 2016-05-07 ENCOUNTER — Encounter: Payer: Self-pay | Admitting: Cardiovascular Disease

## 2016-05-07 VITALS — BP 140/72 | HR 64 | Ht 68.0 in | Wt 162.5 lb

## 2016-05-07 DIAGNOSIS — R0602 Shortness of breath: Secondary | ICD-10-CM | POA: Diagnosis not present

## 2016-05-07 DIAGNOSIS — I359 Nonrheumatic aortic valve disorder, unspecified: Secondary | ICD-10-CM

## 2016-05-07 DIAGNOSIS — I10 Essential (primary) hypertension: Secondary | ICD-10-CM | POA: Diagnosis not present

## 2016-05-07 DIAGNOSIS — I6523 Occlusion and stenosis of bilateral carotid arteries: Secondary | ICD-10-CM

## 2016-05-07 DIAGNOSIS — E782 Mixed hyperlipidemia: Secondary | ICD-10-CM | POA: Diagnosis not present

## 2016-05-07 NOTE — Patient Instructions (Addendum)
Medication Instructions:   No medication changes made  Try cetirazine (zyrtec) for allergy  Labwork:  No new labs needed  Testing/Procedures:  No further testing at this time   I recommend watching educational videos on topics of interest to you at:       www.goemmi.com  Enter code: HEARTCARE    Follow-Up: It was a pleasure seeing you in the office today. Please call us if you have new issues that need to be addressed before your next appt.  [phone]  Your physician wants you to follow-up in: 12 months.  You will receive a reminder letter in the mail two months in advance. If you don't receive a letter, please call our office to schedule the follow-up appointment.  If you need a refill on your cardiac medications before your next appointment, please call your pharmacy.

## 2016-05-10 ENCOUNTER — Other Ambulatory Visit: Payer: Self-pay | Admitting: Family Medicine

## 2016-05-10 DIAGNOSIS — H25813 Combined forms of age-related cataract, bilateral: Secondary | ICD-10-CM | POA: Diagnosis not present

## 2016-05-10 NOTE — Telephone Encounter (Signed)
Sent.  Schedule CPE.  Thanks.  

## 2016-05-10 NOTE — Telephone Encounter (Signed)
Electronic refill request. Last office visit:   02/24/2015 - no upcoming appt scheduled. Last Filled:     30 tablet 0 04/12/2016  Please advise.

## 2016-05-11 NOTE — Telephone Encounter (Signed)
Patient advised.

## 2016-05-18 ENCOUNTER — Other Ambulatory Visit: Payer: Self-pay | Admitting: *Deleted

## 2016-05-18 NOTE — Telephone Encounter (Signed)
Faxed refill request.  Pt has upcoming AMW and CPE scheduled end of May. Last Filled:    1 each jar 09/19/2015  Please advise.

## 2016-05-19 MED ORDER — TRIAMCINOLONE 0.1 % CREAM:EUCERIN CREAM 1:1: 1.0000 "application " | TOPICAL_CREAM | Freq: Two times a day (BID) | CUTANEOUS | 1 refills | Status: DC

## 2016-05-19 NOTE — Telephone Encounter (Signed)
Sent. Thanks.   

## 2016-05-20 ENCOUNTER — Other Ambulatory Visit: Payer: Self-pay | Admitting: *Deleted

## 2016-05-20 MED ORDER — TRIAMCINOLONE 0.1 % CREAM:EUCERIN CREAM 1:1: 1.0000 "application " | TOPICAL_CREAM | Freq: Two times a day (BID) | CUTANEOUS | 1 refills | Status: DC

## 2016-05-26 ENCOUNTER — Other Ambulatory Visit: Payer: Self-pay | Admitting: Family Medicine

## 2016-05-26 ENCOUNTER — Ambulatory Visit (INDEPENDENT_AMBULATORY_CARE_PROVIDER_SITE_OTHER): Payer: Medicare Other

## 2016-05-26 VITALS — BP 122/78 | HR 66 | Temp 98.5°F | Ht 68.0 in | Wt 159.0 lb

## 2016-05-26 DIAGNOSIS — R972 Elevated prostate specific antigen [PSA]: Secondary | ICD-10-CM | POA: Diagnosis not present

## 2016-05-26 DIAGNOSIS — Z0000 Encounter for general adult medical examination without abnormal findings: Secondary | ICD-10-CM | POA: Diagnosis not present

## 2016-05-26 DIAGNOSIS — I10 Essential (primary) hypertension: Secondary | ICD-10-CM | POA: Diagnosis not present

## 2016-05-26 DIAGNOSIS — Z119 Encounter for screening for infectious and parasitic diseases, unspecified: Secondary | ICD-10-CM | POA: Diagnosis not present

## 2016-05-26 DIAGNOSIS — R350 Frequency of micturition: Secondary | ICD-10-CM | POA: Diagnosis not present

## 2016-05-26 DIAGNOSIS — N401 Enlarged prostate with lower urinary tract symptoms: Secondary | ICD-10-CM | POA: Diagnosis not present

## 2016-05-26 DIAGNOSIS — Z125 Encounter for screening for malignant neoplasm of prostate: Secondary | ICD-10-CM

## 2016-05-26 DIAGNOSIS — R3916 Straining to void: Secondary | ICD-10-CM | POA: Diagnosis not present

## 2016-05-26 DIAGNOSIS — R351 Nocturia: Secondary | ICD-10-CM | POA: Diagnosis not present

## 2016-05-26 DIAGNOSIS — R3915 Urgency of urination: Secondary | ICD-10-CM | POA: Diagnosis not present

## 2016-05-26 DIAGNOSIS — R3914 Feeling of incomplete bladder emptying: Secondary | ICD-10-CM | POA: Diagnosis not present

## 2016-05-26 LAB — COMPREHENSIVE METABOLIC PANEL
ALT: 13 U/L (ref 0–53)
AST: 18 U/L (ref 0–37)
Albumin: 4.2 g/dL (ref 3.5–5.2)
Alkaline Phosphatase: 45 U/L (ref 39–117)
BUN: 14 mg/dL (ref 6–23)
CO2: 28 mEq/L (ref 19–32)
Calcium: 9.6 mg/dL (ref 8.4–10.5)
Chloride: 99 mEq/L (ref 96–112)
Creatinine, Ser: 0.85 mg/dL (ref 0.40–1.50)
GFR: 94.58 mL/min (ref >60.00)
Glucose, Bld: 104 mg/dL — ABNORMAL HIGH (ref 70–99)
Potassium: 4 mEq/L (ref 3.5–5.1)
Sodium: 132 mEq/L — ABNORMAL LOW (ref 135–145)
Total Bilirubin: 0.7 mg/dL (ref 0.2–1.2)
Total Protein: 7.1 g/dL (ref 6.0–8.3)

## 2016-05-26 LAB — LIPID PANEL
Cholesterol: 176 mg/dL (ref 0–200)
HDL: 62.5 mg/dL (ref >39.00)
LDL Cholesterol: 90 mg/dL (ref 0–99)
NonHDL: 113.61
Total CHOL/HDL Ratio: 3
Triglycerides: 119 mg/dL (ref 0.0–149.0)
VLDL: 23.8 mg/dL (ref 0.0–40.0)

## 2016-05-26 NOTE — Progress Notes (Signed)
Subjective:   Brett Mcguire is a 71 y.o. male who presents for Medicare Annual/Subsequent preventive examination.  Review of Systems:  N/A Cardiac Risk Factors include: advanced age (>32mn, >>36women);male gender     Objective:    Vitals: BP 122/78 (BP Location: Right Arm, Patient Position: Sitting, Cuff Size: Normal)   Pulse 66   Temp 98.5 F (36.9 C) (Oral)   Ht '5\' 8"'$  (1.727 m) Comment: no shoes  Wt 159 lb (72.1 kg)   SpO2 95%   BMI 24.18 kg/m   Body mass index is 24.18 kg/m.  Tobacco History  Smoking Status  . Former Smoker  . Packs/day: 2.00  . Years: 17.00  . Types: Cigarettes  . Quit date: 02/22/1982  Smokeless Tobacco  . Never Used     Counseling given: No   Past Medical History:  Diagnosis Date  . Allergy 1989  . 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 Diverticulosis 02/2000  . Hyperlipidemia 1989  . [place]   s/p lobectomy, no chemo or rady  . Murmur    Per old records, echo and exercise stress test wnl 204-04-2010  No aortic stenosis on echo.   . Murmur    Past Surgical History:  Procedure Laterality Date  . COLONOSCOPY  02/18/2000   Polyp, diverticulosis, repeat in 5 years  . COLONOSCOPY  03/03/2006   Repeat  -  Divertics  . Laminectomy/Discectomy  11/03/2005   L5/S1  . Malignant melanoma excision  06/20/2003   Dr. KAcquanetta Sit . MRI  10/09/2005   Lumbar spine, bulging disc L5/S1  . Right lobectomy  1989   Lung cancer  . TONSILLECTOMY AND ADENOIDECTOMY     Family History  Problem Relation Age of Onset  . Cancer Mother        [place] throat  . Alcohol abuse Mother   . Cancer Father        4 kinds; 5 places, prostate and lung  . Prostate cancer Father   . Cancer Other        Skin, deceased in 104-Apr-1996  History  Sexual Activity  . Sexual activity: Not on file    Outpatient Encounter Prescriptions as of 05/26/2016  Medication Sig  . amLODipine (NORVASC) 10 MG tablet TAKE 1 TABLET BY MOUTH DAILY, NEED TO SCHEDULE  ANNUAL PHYSICAL FOR FURTHER REFILLS  . Probiotic Product (ALIGN) 4 MG CAPS Take by mouth daily.  . Triamcinolone Acetonide (TRIAMCINOLONE 0.1 % CREAM : EUCERIN) CREA Apply 1 application topically 2 (two) times daily. Disp 1 jar (Patient taking differently: Apply 1 application topically as needed. Disp 1 jar)   No facility-administered encounter medications on file as of 05/26/2016.     Activities of Daily Living In your present state of health, do you have any difficulty performing the following activities: 05/26/2016  Hearing? N  Vision? N  Difficulty concentrating or making decisions? N  Walking or climbing stairs? N  Dressing or bathing? N  Doing errands, shopping? N  Preparing Food and eating ? N  Using the Toilet? N  In the past six months, have you accidently leaked urine? N  Do you have problems with loss of bowel control? N  Managing your Medications? N  Managing your Finances? N  Housekeeping or managing your Housekeeping? N  Some recent data might be hidden    Patient Care Team: DTonia Ghent MD as PCP - General (Family Medicine) GIda Rogue  J, MD as Consulting Physician (Cardiology) Royston Cowper, MD (Urology)   Assessment:     Hearing Screening   '125Hz'$  '250Hz'$  '500Hz'$  '1000Hz'$  '2000Hz'$  '3000Hz'$  '4000Hz'$  '6000Hz'$  '8000Hz'$   Right ear:   40 40 40  40    Left ear:   40 40 40  40    Vision Screening Comments: Last vision exam in May 2018 with Dr. Gloriann Loan   Exercise Activities and Dietary recommendations Current Exercise Habits: Home exercise routine, Type of exercise: walking, Time (Minutes): > 60, Frequency (Times/Week): 7, Weekly Exercise (Minutes/Week): 0, Intensity: Moderate, Exercise limited by: None identified  Goals    . Increase physical activity          Starting 05/26/2016, I will continue to walk at least 10,000 steps daily.       Fall Risk Fall Risk  05/26/2016 09/09/2015  Falls in the past year? No No   Depression Screen PHQ 2/9 Scores 05/26/2016  PHQ - 2  Score 0    Cognitive Function MMSE - Mini Mental State Exam 05/26/2016  Orientation to time 5  Orientation to Place 5  Registration 3  Attention/ Calculation 0  Recall 3  Language- name 2 objects 0  Language- repeat 1  Language- follow 3 step command 3  Language- read & follow direction 0  Write a sentence 0  Copy design 0  Total score 20     PLEASE NOTE: A Mini-Cog screen was completed. Maximum score is 20. A value of 0 denotes this part of [place] MMSE was not completed or the patient failed this part of the Mini-Cog screening.   Mini-Cog Screening Orientation to Time - Max 5 pts Orientation to Place - Max 5 pts Registration - Max 3 pts Recall - Max 3 pts Language Repeat - Max 1 pts Language Follow 3 Step Command - Max 3 pts     Immunization History  Administered Date(s) Administered  . Influenza-Unspecified 10/31/2014, 11/13/2014, 12/01/2015  . Pneumococcal Conjugate-13 02/24/2015  . Pneumococcal Polysaccharide-23 01/11/1994  . Pneumococcal-Unspecified 01/16/2012  . Td 02/09/2006  . Tdap 05/15/2014  . Zoster 08/11/2013   Screening Tests Health Maintenance  Topic Date Due  . INFLUENZA VACCINE  08/11/2016  . COLONOSCOPY  10/27/2021  . TETANUS/TDAP  05/14/2024  . Hepatitis C Screening  Completed  . PNA vac Low Risk Adult  Completed      Plan:  I have personally reviewed and addressed the Medicare Annual Wellness questionnaire and have noted the following in the patient's chart:  A. Medical and social history B. Use of alcohol, tobacco or illicit drugs  C. Current medications and supplements D. Functional ability and status E.  Nutritional status F.  Physical activity G. Advance directives H. List of other physicians I.  Hospitalizations, surgeries, and ER visits in previous 12 months J.  [place] to include hearing, vision, cognitive, depression L. Referrals and appointments - none  In addition, I have reviewed and discussed with patient  certain preventive protocols, quality metrics, and best practice recommendations. A written personalized care plan for preventive services as well as general preventive health recommendations were provided to patient.  See attached scanned questionnaire for additional information.   Signed,   Lindell Noe, MHA, BS, LPN Health Coach

## 2016-05-26 NOTE — Progress Notes (Signed)
PCP notes:   Health maintenance:  Hep C screening - completed  Abnormal screenings:   None  Patient concerns:   None  Nurse concerns:  None  Next PCP appt:   06/04/16 @ 1045  I reviewed health advisor's note, was available for consultation on the day of service listed in this note, and agree with documentation and plan. Elsie Stain, MD.

## 2016-05-26 NOTE — Patient Instructions (Signed)
Brett Mcguire , Thank you for taking time to come for your Medicare Wellness Visit. I appreciate your ongoing commitment to your health goals. Please review the following plan we discussed and let me know if I can assist you in the future.   These are the goals we discussed: Goals    . Increase physical activity          Starting 05/26/2016, I will continue to walk at least 10,000 steps daily.        This is a list of the screening recommended for you and due dates:  Health Maintenance  Topic Date Due  . Flu Shot  08/11/2016  . Colon Cancer Screening  10/27/2021  . Tetanus Vaccine  05/14/2024  .  Hepatitis C: One time screening is recommended by Center for Disease Control  (CDC) for  adults born from [birth year] through 1965.   Completed  . Pneumonia vaccines  Completed   Preventive Care for Adults  A healthy lifestyle and preventive care can promote health and wellness. Preventive health guidelines for adults include the following key practices.  . A routine yearly physical is a good way to check with your health care provider about your health and preventive screening. It is a chance to share any concerns and updates on your health and to receive a thorough exam.  . Visit your dentist for a routine exam and preventive care every 6 months. Brush your teeth twice a day and floss once a day. Good oral hygiene prevents tooth decay and gum disease.  . The frequency of eye exams is based on your age, health, family medical history, use  of contact lenses, and other factors. Follow your health care provider's ecommendations for frequency of eye exams.  . Eat a healthy diet. Foods like vegetables, fruits, whole grains, low-fat dairy products, and lean protein foods contain the nutrients you need without too many calories. Decrease your intake of foods high in solid fats, added sugars, and salt. Eat the right amount of calories for you. Get information about a proper diet from your health care  provider, if necessary.  . Regular physical exercise is one of the most important things you can do for your health. Most adults should get at least 150 minutes of moderate-intensity exercise (any activity that increases your heart rate and causes you to sweat) each week. In addition, most adults need muscle-strengthening exercises on 2 or more days a week.  Silver Sneakers may be a benefit available to you. To determine eligibility, you may visit the website: www.silversneakers.com or contact program at 864-312-5015 Mon-Fri between 8AM-8PM.   . Maintain a healthy weight. The body mass index (BMI) is a screening tool to identify possible weight problems. It provides an estimate of body fat based on height and weight. Your health care provider can find your BMI and can help you achieve or maintain a healthy weight.   For adults 20 years and older: ? A BMI below 18.5 is considered underweight. ? A BMI of 18.5 to 24.9 is normal. ? A BMI of 25 to 29.9 is considered overweight. ? A BMI of 30 and above is considered obese.   . Maintain normal blood lipids and cholesterol levels by exercising and minimizing your intake of saturated fat. Eat a balanced diet with plenty of fruit and vegetables. Blood tests for lipids and cholesterol should begin at age 37 and be repeated every 5 years. If your lipid or cholesterol levels are high, you are over  50, or you are at high risk for heart disease, you may need your cholesterol levels checked more frequently. Ongoing high lipid and cholesterol levels should be treated with medicines if diet and exercise are not working.  . If you smoke, find out from your health care provider how to quit. If you do not use tobacco, please do not start.  . If you choose to drink alcohol, please do not consume more than 2 drinks per day. One drink is considered to be 12 ounces (355 mL) of beer, 5 ounces (148 mL) of wine, or 1.5 ounces (44 mL) of liquor.  . If you are 46-79 years  old, ask your health care provider if you should take aspirin to prevent strokes.  . Use sunscreen. Apply sunscreen liberally and repeatedly throughout the day. You should seek shade when your shadow is shorter than you. Protect yourself by wearing long sleeves, pants, a wide-brimmed hat, and sunglasses year round, whenever you are outdoors.  . Once a month, do a whole body skin exam, using a mirror to look at the skin on your back. Tell your health care provider of new moles, moles that have irregular borders, moles that are larger than a pencil eraser, or moles that have changed in shape or color.

## 2016-05-26 NOTE — Progress Notes (Signed)
Pre visit review using our clinic review tool, if applicable. No additional management support is needed unless otherwise documented below in the visit note. 

## 2016-05-27 LAB — HEPATITIS C ANTIBODY: HCV Ab: NEGATIVE

## 2016-06-04 ENCOUNTER — Ambulatory Visit (INDEPENDENT_AMBULATORY_CARE_PROVIDER_SITE_OTHER): Payer: Medicare Other | Admitting: Family Medicine

## 2016-06-04 ENCOUNTER — Encounter: Payer: Self-pay | Admitting: Family Medicine

## 2016-06-04 VITALS — BP 118/78 | HR 62 | Temp 98.8°F | Ht 68.0 in | Wt 160.2 lb

## 2016-06-04 DIAGNOSIS — N418 Other inflammatory diseases of prostate: Secondary | ICD-10-CM

## 2016-06-04 DIAGNOSIS — I10 Essential (primary) hypertension: Secondary | ICD-10-CM | POA: Diagnosis not present

## 2016-06-04 DIAGNOSIS — Z7189 Other specified counseling: Secondary | ICD-10-CM

## 2016-06-04 DIAGNOSIS — I6523 Occlusion and stenosis of bilateral carotid arteries: Secondary | ICD-10-CM

## 2016-06-04 MED ORDER — AMLODIPINE BESYLATE 10 MG PO TABS: ORAL_TABLET | ORAL | 3 refills | Status: DC

## 2016-06-04 NOTE — Progress Notes (Signed)
Hypertension:  Using medication without problems or lightheadedness: yes Chest pain with exertion:no Edema:no Short of breath:no Moderate aortic valve stenosis in 2016, d/w pt.  He is still working outside w/o troubles.   Labs d/w pt.    He is still seeing urology re: prostate.  He has some lower urine stream at night, slow to start.  PSA per urology.  I'll defer to uro,  Patient agrees.    PRN TAC cream helps with occ rough itchy skin lesions on the feet.  Irregular onset of sx.    He has had multiple tick bites.  No other atypical rash.  No fevers.    Advance directive- wife designated if patient were incapacitated.   Meds, vitals, and allergies reviewed.   PMH and SH reviewed  ROS: Per HPI unless specifically indicated in ROS section   GEN: nad, alert and oriented HEENT: mucous membranes moist NECK: supple w/o LA CV: rrr. SEM radiates to the carotids B PULM: ctab, no inc wob ABD: soft, +bs EXT: no edema SKIN: no acute rash SKs, benign appearing.  Minimal irritation on the L upper back at the tick bite site.

## 2016-06-04 NOTE — Patient Instructions (Signed)
Don't change your meds for now.  Update me as needed.  Take care.  Glad to see you.

## 2016-06-06 NOTE — Assessment & Plan Note (Signed)
Per uro. I'll defer.

## 2016-06-06 NOTE — Assessment & Plan Note (Signed)
Controlled. He has AS but BP is controlled w/o exertional sx.  Continue as is with meds, diet, exercise.   D/w pt.  He agrees.  >25 minutes spent in face to face time with patient, >50% spent in counselling or coordination of care.

## 2016-06-06 NOTE — Assessment & Plan Note (Signed)
Advance directive- wife designated if patient were incapacitated.  

## 2016-09-21 DIAGNOSIS — I788 Other diseases of capillaries: Secondary | ICD-10-CM | POA: Diagnosis not present

## 2016-09-21 DIAGNOSIS — D485 Neoplasm of uncertain behavior of skin: Secondary | ICD-10-CM | POA: Diagnosis not present

## 2016-09-21 DIAGNOSIS — L820 Inflamed seborrheic keratosis: Secondary | ICD-10-CM | POA: Diagnosis not present

## 2016-09-21 DIAGNOSIS — D229 Melanocytic nevi, unspecified: Secondary | ICD-10-CM | POA: Diagnosis not present

## 2016-09-21 DIAGNOSIS — L578 Other skin changes due to chronic exposure to nonionizing radiation: Secondary | ICD-10-CM | POA: Diagnosis not present

## 2016-09-21 DIAGNOSIS — L739 Follicular disorder, unspecified: Secondary | ICD-10-CM | POA: Diagnosis not present

## 2016-09-21 DIAGNOSIS — C44319 Basal cell carcinoma of skin of other parts of face: Secondary | ICD-10-CM | POA: Diagnosis not present

## 2016-09-21 DIAGNOSIS — L821 Other seborrheic keratosis: Secondary | ICD-10-CM | POA: Diagnosis not present

## 2016-09-21 DIAGNOSIS — Z85828 Personal history of other malignant neoplasm of skin: Secondary | ICD-10-CM | POA: Diagnosis not present

## 2016-09-21 DIAGNOSIS — L812 Freckles: Secondary | ICD-10-CM | POA: Diagnosis not present

## 2016-09-21 DIAGNOSIS — Z85820 Personal history of malignant melanoma of skin: Secondary | ICD-10-CM | POA: Diagnosis not present

## 2016-11-01 DIAGNOSIS — Z23 Encounter for immunization: Secondary | ICD-10-CM | POA: Diagnosis not present

## 2016-11-05 ENCOUNTER — Encounter: Payer: Self-pay | Admitting: Family Medicine

## 2016-11-22 DIAGNOSIS — Q809 Congenital ichthyosis, unspecified: Secondary | ICD-10-CM | POA: Diagnosis not present

## 2016-11-22 DIAGNOSIS — L821 Other seborrheic keratosis: Secondary | ICD-10-CM | POA: Diagnosis not present

## 2016-11-22 DIAGNOSIS — L578 Other skin changes due to chronic exposure to nonionizing radiation: Secondary | ICD-10-CM | POA: Diagnosis not present

## 2016-11-22 DIAGNOSIS — L820 Inflamed seborrheic keratosis: Secondary | ICD-10-CM | POA: Diagnosis not present

## 2016-11-22 DIAGNOSIS — L812 Freckles: Secondary | ICD-10-CM | POA: Diagnosis not present

## 2016-11-26 DIAGNOSIS — N401 Enlarged prostate with lower urinary tract symptoms: Secondary | ICD-10-CM | POA: Diagnosis not present

## 2016-11-26 DIAGNOSIS — R3914 Feeling of incomplete bladder emptying: Secondary | ICD-10-CM | POA: Diagnosis not present

## 2016-11-26 DIAGNOSIS — R350 Frequency of micturition: Secondary | ICD-10-CM | POA: Diagnosis not present

## 2016-11-26 DIAGNOSIS — R972 Elevated prostate specific antigen [PSA]: Secondary | ICD-10-CM | POA: Diagnosis not present

## 2016-11-26 DIAGNOSIS — R3916 Straining to void: Secondary | ICD-10-CM | POA: Diagnosis not present

## 2016-11-26 DIAGNOSIS — D400 Neoplasm of uncertain behavior of prostate: Secondary | ICD-10-CM | POA: Diagnosis not present

## 2016-11-26 DIAGNOSIS — N5201 Erectile dysfunction due to arterial insufficiency: Secondary | ICD-10-CM | POA: Diagnosis not present

## 2017-02-17 IMAGING — MR MR PROSTATE WO/W CM
23 of 56 series · 23 of 56 positions shown · IV contrast (yes)
Comparison: 07/04/2006 stone study.

CLINICAL DATA: Prior negative biopsy and persistent elevated PSA.
Prior episodes of prostatitis.

EXAM:
MR PROSTATE WITHOUT AND WITH CONTRAST
TECHNIQUE: Multiplanar multisequence MRI images were obtained of the pelvis
centered about the prostate. Pre and post contrast images were
obtained.
CONTRAST:  15mL MULTIHANCE GADOBENATE DIMEGLUMINE 529 MG/ML IV SOLN

[Series 3: bSSFP fat-sat · axial · 6.0mm · 0.86mm/px · 1 of 44 slices shown]
[im 1/44]
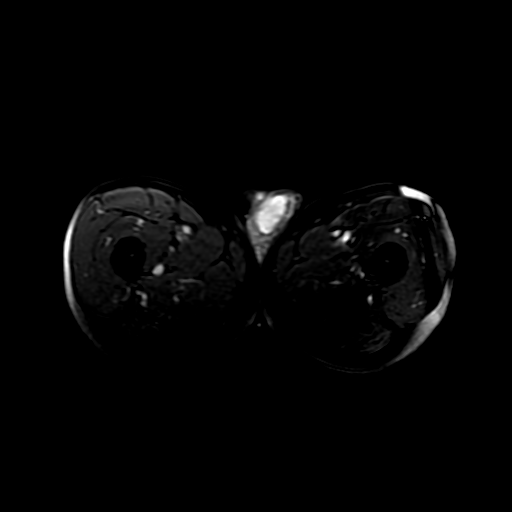

[Series 4: T1 · axial · 6.0mm · 0.86mm/px · 1 of 44 slices shown (1 of 2)]
[im 1/44]
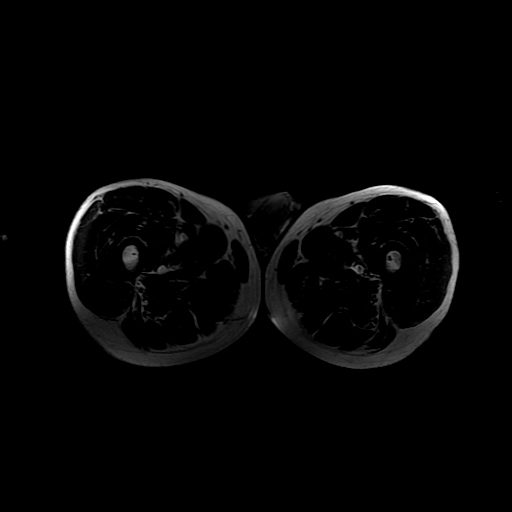

[Series 5: T2 · axial · 3.0mm · 0.29mm/px · 1 of 24 slices shown (1 of 4)]
[im 1/24]
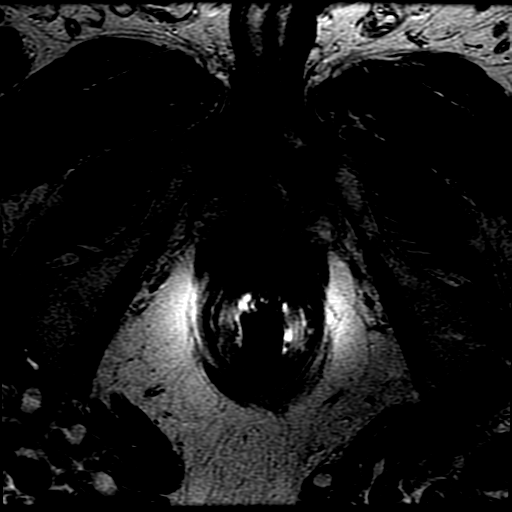

[Series 6: T1 · axial · 3.0mm · 0.29mm/px · 1 of 24 slices shown (2 of 2)]
[im 1/24]
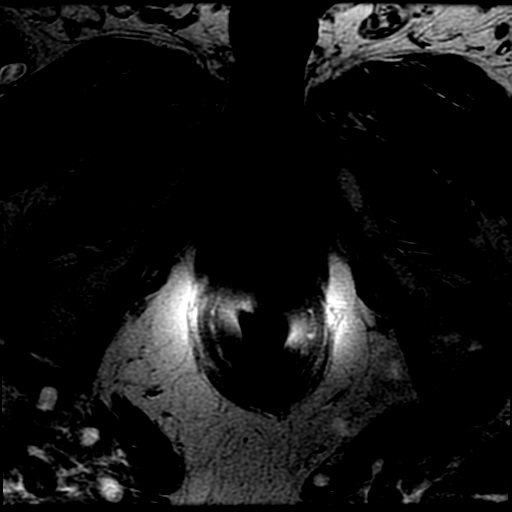

[Series 7: T2 · axial · 1.8mm · 0.47mm/px · 1 of 128 slices shown (2 of 4)]
[im 1/128]
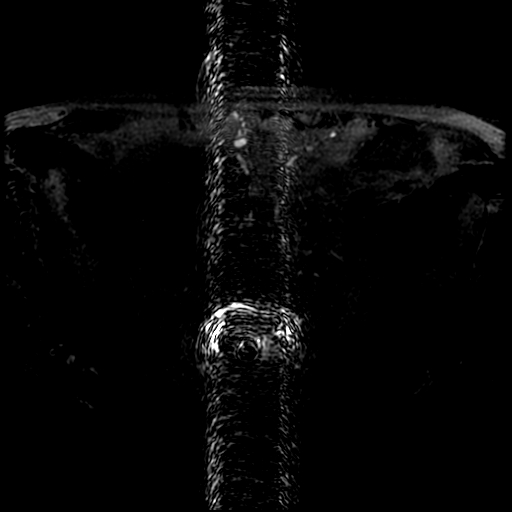

[Series 8: T2 · sagittal · 4.0mm · 0.29mm/px · 1 of 24 slices shown (3 of 4)]
[im 1/24]
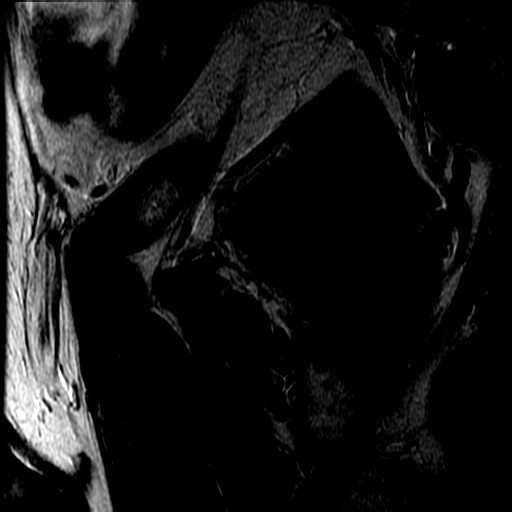

[Series 9: T2 · coronal · 4.0mm · 0.29mm/px · 1 of 21 slices shown (4 of 4)]
[im 1/21]
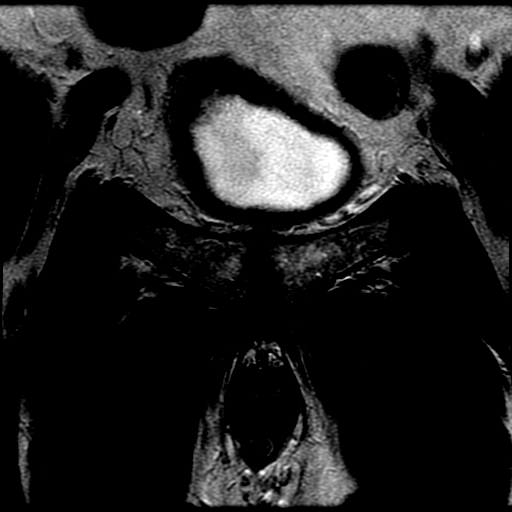

[Series 10: DWI · axial · 3.0mm · 0.59mm/px · 1 of 47 slices shown (1 of 6)]
[im 1/47]
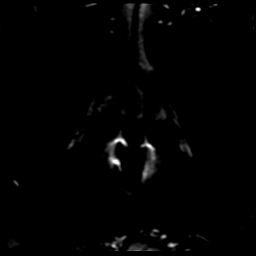

[Series 11: DWI · axial · 3.0mm · 0.59mm/px · 1 of 46 slices shown (2 of 6)]
[im 1/46]
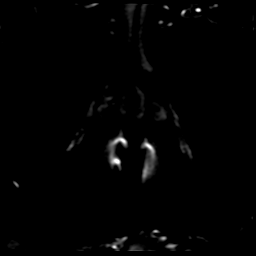

[Series 12: DWI · axial · 3.0mm · 0.59mm/px · 1 of 46 slices shown (3 of 6)]
[im 1/46]
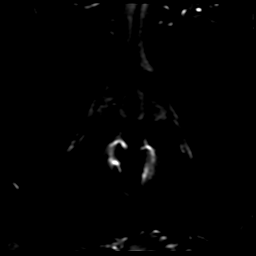

[Series 700: reformatted · axial · 1.8mm · 0.47mm/px · 1 of 104 slices shown (1 of 2)]
[im 1/104]
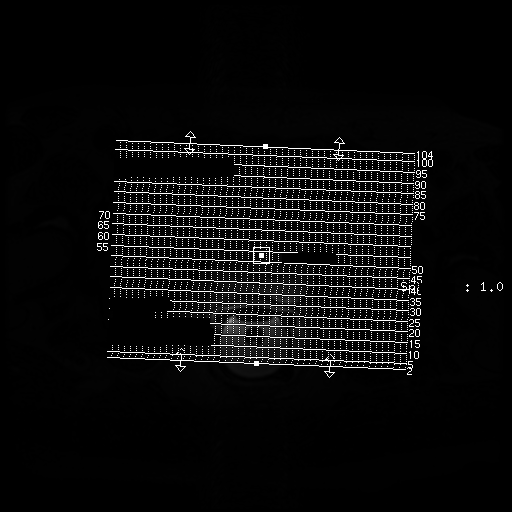

[Series 701: reformatted · axial · 1.8mm · 0.47mm/px · 1 of 104 slices shown (2 of 2)]
[im 1/104]
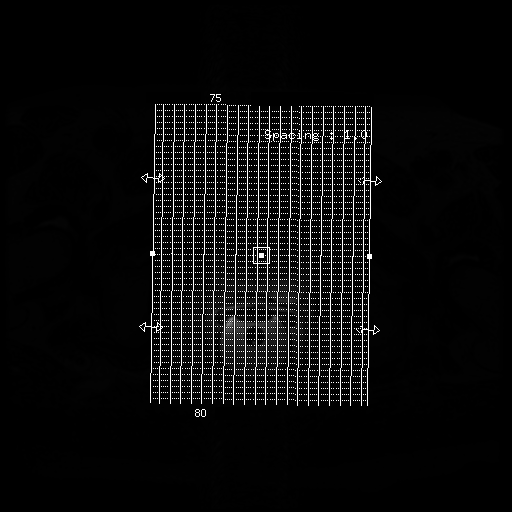

[Series 1000: DWI · axial · 3.0mm · 0.59mm/px · 1 of 24 slices shown (4 of 6)]
[im 1/24]
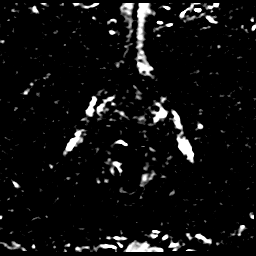

[Series 1100: DWI · axial · 3.0mm · 0.59mm/px · 1 of 24 slices shown (5 of 6)]
[im 1/24]
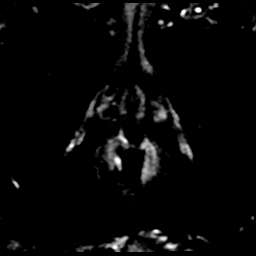

[Series 1200: DWI · axial · 3.0mm · 0.59mm/px · 1 of 24 slices shown (6 of 6)]
[im 1/24]
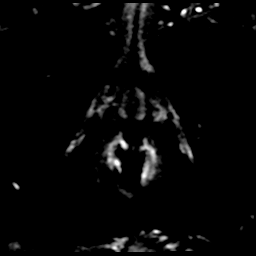

[((id)/(id)/1)-((id)/(id)/1) · axial · 3.0mm · 0.43mm/px · 1 of 70 slices shown (1 of 8)]
[im 1/70]
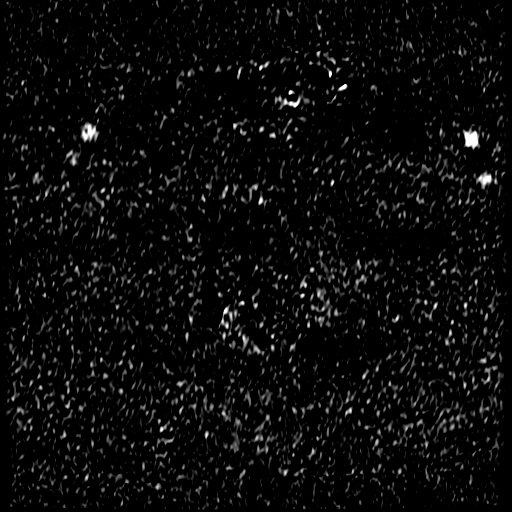

[((id)/(id)/1)-((id)/(id)/1) · axial · 3.0mm · 0.43mm/px · 1 of 72 slices shown (2 of 8)]
[im 1/72]
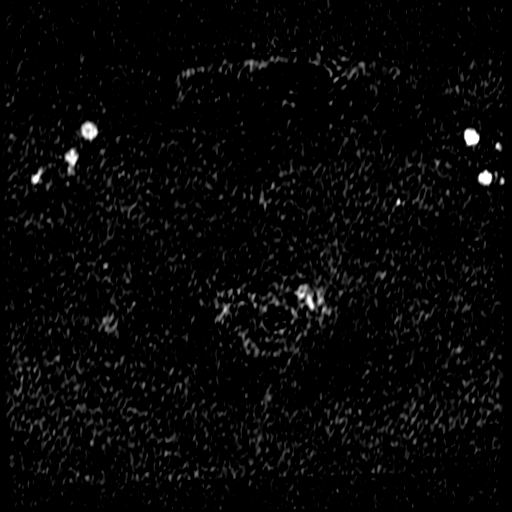

[((id)/(id)/1)-((id)/(id)/1) · axial · 3.0mm · 0.43mm/px · 1 of 71 slices shown (3 of 8)]
[im 1/71]
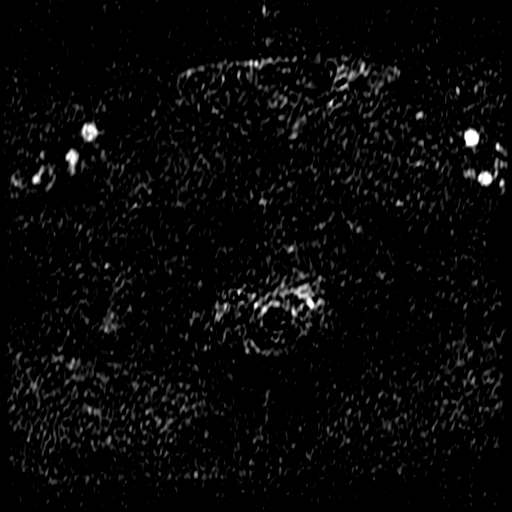

[((id)/(id)/1)-((id)/(id)/1) · axial · 3.0mm · 0.43mm/px · 1 of 70 slices shown (4 of 8)]
[im 1/70]
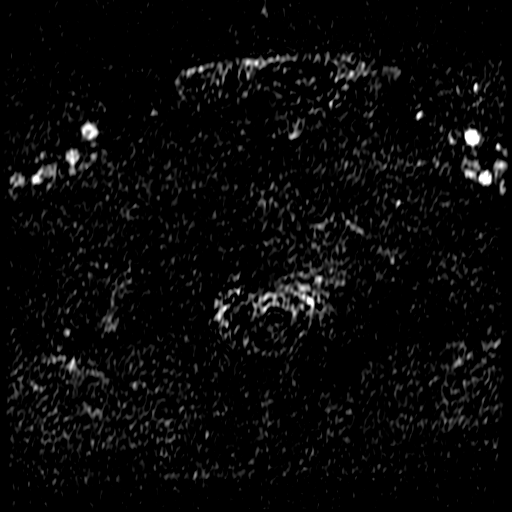

[((id)/(id)/1)-((id)/(id)/1) · axial · 3.0mm · 0.43mm/px · 1 of 72 slices shown (5 of 8)]
[im 1/72]
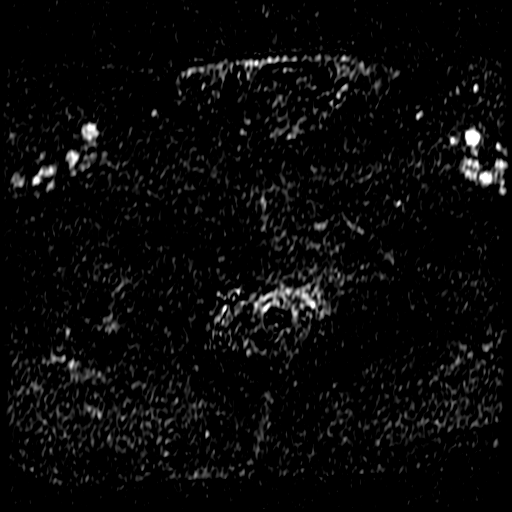

[((id)/(id)/1)-((id)/(id)/1) · axial · 3.0mm · 0.43mm/px · 1 of 71 slices shown (6 of 8)]
[im 1/71]
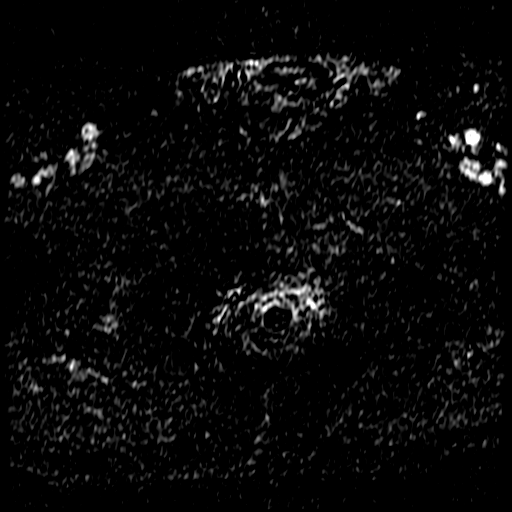

[((id)/(id)/1)-((id)/(id)/1) · axial · 3.0mm · 0.43mm/px · 1 of 71 slices shown (7 of 8)]
[im 1/71]
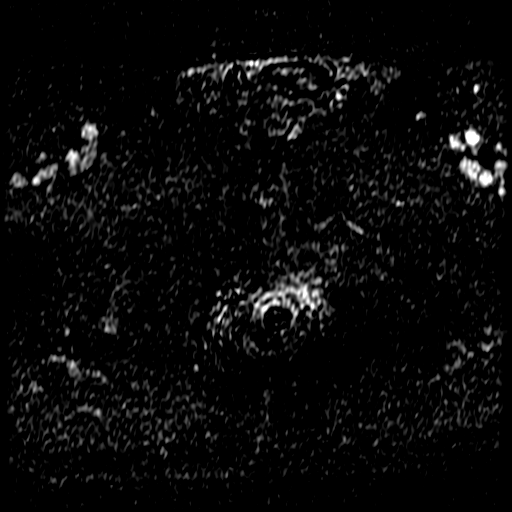

[((id)/(id)/1)-((id)/(id)/1) · axial · 3.0mm · 0.43mm/px · 1 of 70 slices shown (8 of 8)]
[im 1/70]
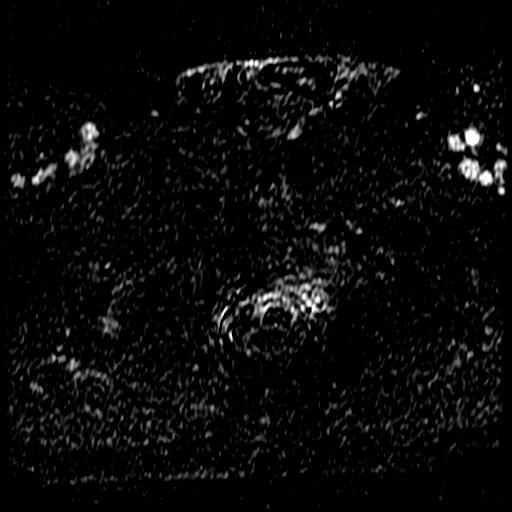

[23 of 56 positions shown; findings below may reference images not displayed]

FINDINGS: Prostate: Mild central gland enlargement and heterogeneity,
consistent with benign prostatic hyperplasia. No peripheral zone
foci of T2 hypointensity to suggest carcinoma. No restricted
diffusion or early post-contrast enhancement identified.

Transcapsular spread:  Absent

Seminal vesicle involvement: Absent

Neurovascular bundle involvement: Absent

Pelvic adenopathy: Absent

Bone metastasis: Absent. Probable subchondral cyst in the right
femoral head.

Other findings: Suggestion of bladder wall thickening, possibly
related to outlet obstruction. Example image 22/series 3. Normal
pelvic bowel loops. No significant free fluid.
IMPRESSION: 1. No findings to suggest macroscopic or high-grade prostate
carcinoma. Mild benign prostatic hyperplasia.
2. Possible mild bladder wall thickening, as can be seen with outlet
obstruction.

## 2017-03-01 ENCOUNTER — Telehealth: Payer: Self-pay | Admitting: Family Medicine

## 2017-03-01 NOTE — Telephone Encounter (Signed)
Patient advised.

## 2017-03-01 NOTE — Telephone Encounter (Signed)
Likely useless to take.  If his urinary sx are worse, then he needs to f/u with urology.  Thanks.

## 2017-03-01 NOTE — Telephone Encounter (Signed)
Pt questioning if it is OK to take the supplement 'Super Beta Prostate' with his current medications. Please advise.  [phone]

## 2017-03-01 NOTE — Telephone Encounter (Signed)
Pt questioning if it is OK to take the supplement 'Super Beta Prostate' with his current medications. Please advise.  [phone]

## 2017-05-30 DIAGNOSIS — N5201 Erectile dysfunction due to arterial insufficiency: Secondary | ICD-10-CM | POA: Diagnosis not present

## 2017-05-30 DIAGNOSIS — R972 Elevated prostate specific antigen [PSA]: Secondary | ICD-10-CM | POA: Diagnosis not present

## 2017-05-30 DIAGNOSIS — D400 Neoplasm of uncertain behavior of prostate: Secondary | ICD-10-CM | POA: Diagnosis not present

## 2017-05-30 DIAGNOSIS — N401 Enlarged prostate with lower urinary tract symptoms: Secondary | ICD-10-CM | POA: Diagnosis not present

## 2017-05-31 ENCOUNTER — Other Ambulatory Visit: Payer: Self-pay | Admitting: Family Medicine

## 2017-05-31 ENCOUNTER — Encounter: Payer: Self-pay | Admitting: Family Medicine

## 2017-05-31 ENCOUNTER — Ambulatory Visit (INDEPENDENT_AMBULATORY_CARE_PROVIDER_SITE_OTHER): Payer: Medicare Other

## 2017-05-31 VITALS — BP 126/80 | HR 80 | Temp 98.0°F | Ht 68.0 in | Wt 160.2 lb

## 2017-05-31 DIAGNOSIS — I10 Essential (primary) hypertension: Secondary | ICD-10-CM | POA: Diagnosis not present

## 2017-05-31 DIAGNOSIS — Z0000 Encounter for general adult medical examination without abnormal findings: Secondary | ICD-10-CM

## 2017-05-31 LAB — COMPREHENSIVE METABOLIC PANEL
ALT: 12 U/L (ref 0–53)
AST: 16 U/L (ref 0–37)
Albumin: 4.2 g/dL (ref 3.5–5.2)
Alkaline Phosphatase: 48 U/L (ref 39–117)
BUN: 15 mg/dL (ref 6–23)
CO2: 27 mEq/L (ref 19–32)
Calcium: 9.6 mg/dL (ref 8.4–10.5)
Chloride: 98 mEq/L (ref 96–112)
Creatinine, Ser: 0.83 mg/dL (ref 0.40–1.50)
GFR: 96.94 mL/min (ref >60.00)
Glucose, Bld: 112 mg/dL — ABNORMAL HIGH (ref 70–99)
Potassium: 4.2 mEq/L (ref 3.5–5.1)
Sodium: 133 mEq/L — ABNORMAL LOW (ref 135–145)
Total Bilirubin: 0.7 mg/dL (ref 0.2–1.2)
Total Protein: 7.3 g/dL (ref 6.0–8.3)

## 2017-05-31 LAB — LIPID PANEL
Cholesterol: 179 mg/dL (ref 0–200)
HDL: 67.5 mg/dL (ref >39.00)
LDL Cholesterol: 96 mg/dL (ref 0–99)
NonHDL: 111.91
Total CHOL/HDL Ratio: 3
Triglycerides: 82 mg/dL (ref 0.0–149.0)
VLDL: 16.4 mg/dL (ref 0.0–40.0)

## 2017-05-31 NOTE — Progress Notes (Signed)
Subjective:   Brett Mcguire is a 72 y.o. male who presents for Medicare Annual/Subsequent preventive examination.  Review of Systems:  N/A Cardiac Risk Factors include: advanced age (>62men, >5 women);male gender     Objective:    Vitals: BP 126/80 (BP Location: Right Arm, Patient Position: Sitting, Cuff Size: Normal)   Pulse 80   Temp 98 F (36.7 C) (Oral)   Ht 5\' 8"  (1.727 m) Comment: no shoes  Wt 160 lb 4 oz (72.7 kg)   SpO2 96%   BMI 24.37 kg/m   Body mass index is 24.37 kg/m.  Advanced Directives 05/31/2017 05/26/2016  Does Patient Have a Medical Advance Directive? Yes No  Type of Advance Directive [place] in Chart? No - copy requested -    Tobacco Social History   Tobacco Use  Smoking Status Former Smoker  . Packs/day: 2.00  . Years: 17.00  . Pack years: 34.00  . Types: Cigarettes  . Last attempt to quit: 02/22/1982  . Years since quitting: 35.2  Smokeless Tobacco Never Used     Counseling given: No   Clinical Intake:  Pre-visit preparation completed: Yes  Pain : No/denies pain Pain Score: 0-No pain     Nutritional Status: BMI 25 -29 Overweight Nutritional Risks: None Diabetes: No  How often do you need to have someone help you when you read instructions, pamphlets, or other written materials from your doctor or pharmacy?: 1 - Never What is the last grade level you completed in school?: 12th grade + some college courses  Interpreter Needed?: No  Comments: pt lives with spouse Information entered by :: LPinson, LPN  Past Medical History:  Diagnosis Date  . Allergy 1989  . 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 Diverticulosis 02/2000  . Hyperlipidemia 1989  . Hypertension   . Lung cancer ([place]) 1989   s/p lobectomy, no chemo or rady  . Murmur    Per old records, echo and exercise stress test wnl 04-19-2010.  No aortic stenosis on echo.    Past Surgical History:    Procedure Laterality Date  . COLONOSCOPY  02/18/2000   Polyp, diverticulosis, repeat in 5 years  . COLONOSCOPY  03/03/2006   Repeat  -  Divertics  . Laminectomy/Discectomy  11/03/2005   L5/S1  . Malignant melanoma excision  06/20/2003   Dr. Acquanetta Sit  . MRI  10/09/2005   Lumbar spine, bulging disc L5/S1  . Right lobectomy  1989   Lung cancer  . TONSILLECTOMY AND ADENOIDECTOMY     Family History  Problem Relation Age of Onset  . Cancer Mother        [place] throat  . Alcohol abuse Mother   . Cancer Father        4 kinds; 5 places, prostate and lung  . Prostate cancer Father   . Cancer Other        Skin, deceased in 1994-04-19  . Colon cancer Neg Hx        none known   Social History   Socioeconomic History  . Marital status: Married    Spouse name: Not on file  . Number of children: 2  . Years of education: Not on file  . Highest education level: Not on file  Occupational History  . 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  Financial resource strain: Not on file  . Food insecurity:    Worry: Not on file    Inability: Not on file  . Transportation needs:    Medical: Not on file    Non-medical: Not on file  Tobacco Use  . Smoking status: Former Smoker    Packs/day: 2.00    Years: 17.00    Pack years: 34.00    Types: Cigarettes    Last attempt to quit: 02/22/1982    Years since quitting: 35.2  . Smokeless tobacco: Never Used  Substance and Sexual Activity  . Alcohol use: Yes    Alcohol/week: 4.2 oz    Types: 7 Glasses of wine per week    Comment: wine  . Drug use: No  . Sexual activity: Yes  Lifestyle  . Physical activity:    Days per week: Not on file    Minutes per session: Not on file  . Stress: Not on file  Relationships  . Social connections:    Talks on phone: Not on file    Gets together: Not on file    Attends religious service: Not on file    Active member of club or organization: Not on file     Attends meetings of clubs or organizations: Not on file    Relationship status: Not on file  Other Topics Concern  . 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    Married 40+ years    Outpatient Encounter Medications as of 05/31/2017  Medication Sig  . amLODipine (NORVASC) 10 MG tablet TAKE 1 TABLET BY MOUTH DAILY  . Probiotic Product (ALIGN) 4 MG CAPS Take by mouth daily.  . Triamcinolone Acetonide (TRIAMCINOLONE 0.1 % CREAM : EUCERIN) CREA Apply 1 application topically 2 (two) times daily. Disp 1 jar (Patient taking differently: Apply 1 application topically as needed. Disp 1 jar)   No facility-administered encounter medications on file as of 05/31/2017.     Activities of Daily Living In your present state of health, do you have any difficulty performing the following activities: 05/31/2017  Hearing? N  Vision? N  Difficulty concentrating or making decisions? N  Walking or climbing stairs? N  Dressing or bathing? N  Doing errands, shopping? N  Preparing Food and eating ? N  Using the Toilet? N  In the past six months, have you accidently leaked urine? N  Do you have problems with loss of bowel control? N  Managing your Medications? N  Managing your Finances? N  Housekeeping or managing your Housekeeping? N  Some recent data might be hidden    Patient Care Team: Tonia Ghent, MD as PCP - General (Family Medicine) Minna Merritts, MD as Consulting Physician (Cardiology) Royston Cowper, MD (Urology)   Assessment:   This is a routine wellness examination for Brett Mcguire.  Exercise Activities and Dietary recommendations Current Exercise Habits: Home exercise routine, Type of exercise: walking, Time (Minutes): > 60, Frequency (Times/Week): 7, Weekly Exercise (Minutes/Week): 0, Intensity: Moderate, Exercise limited by: None identified  Goals    . Increase physical activity     Starting  05/31/2017, I will continue to walk up to 5 miles daily.        Fall Risk Fall Risk  05/31/2017 05/26/2016 09/09/2015  Falls in the past year? No No No  Comment - - Emmi Telephone Survey: data to providers prior  to load   Depression Screen PHQ 2/9 Scores 05/31/2017 05/26/2016  PHQ - 2 Score 1 0  PHQ- 9 Score 3 -    Cognitive Function MMSE - Mini Mental State Exam 05/31/2017 05/26/2016  Orientation to time 5 5  Orientation to Place 5 5  Registration 3 3  Attention/ Calculation 0 0  Recall 2 3  Recall-comments unable to recall 1 of 3 words -  Language- name 2 objects 0 0  Language- repeat 1 1  Language- follow 3 step command 3 3  Language- read & follow direction 0 0  Write a sentence 0 0  Copy design 0 0  Total score 19 20       PLEASE NOTE: A Mini-Cog screen was completed. Maximum score is 20. A value of 0 denotes this part of Folstein MMSE was not completed or the patient failed this part of the Mini-Cog screening.   Mini-Cog Screening Orientation to Time - Max 5 pts Orientation to Place - Max 5 pts Registration - Max 3 pts Recall - Max 3 pts Language Repeat - Max 1 pts Language Follow 3 Step Command - Max 3 pts   Immunization History  Administered Date(s) Administered  . Influenza,inj,Quad PF,6+ Mos 11/01/2016  . Influenza-Unspecified 10/31/2014, 11/13/2014, 12/01/2015  . Pneumococcal Conjugate-13 02/24/2015  . Pneumococcal Polysaccharide-23 01/11/1994  . Pneumococcal-Unspecified 01/16/2012  . Td 02/09/2006  . Tdap 05/15/2014  . Zoster 08/11/2013    Screening Tests Health Maintenance  Topic Date Due  . INFLUENZA VACCINE  08/11/2017  . COLONOSCOPY  10/27/2021  . TETANUS/TDAP  05/14/2024  . Hepatitis C Screening  Completed  . PNA vac Low Risk Adult  Completed       Plan:     I have personally reviewed, addressed, and noted the following in the patient's chart:  A. Medical and social history B. Use of alcohol, tobacco or illicit drugs  C. Current  medications and supplements D. Functional ability and status E.  Nutritional status F.  Physical activity G. Advance directives H. List of other physicians I.  Hospitalizations, surgeries, and ER visits in previous 12 months J.  [place] to include hearing, vision, cognitive, depression L. Referrals and appointments - none  In addition, I have reviewed and discussed with patient certain preventive protocols, quality metrics, and best practice recommendations. A written personalized care plan for preventive services as well as general preventive health recommendations were provided to patient.  See attached scanned questionnaire for additional information.   Signed,   Lindell Noe, MHA, BS, LPN Health Coach

## 2017-05-31 NOTE — Patient Instructions (Signed)
Brett Mcguire , Thank you for taking time to come for your Medicare Wellness Visit. I appreciate your ongoing commitment to your health goals. Please review the following plan we discussed and let me know if I can assist you in the future.   These are the goals we discussed: Goals    . Increase physical activity     Starting 05/31/2017, I will continue to walk up to 5 miles daily.        This is a list of the screening recommended for you and due dates:  Health Maintenance  Topic Date Due  . Flu Shot  08/11/2017  . Colon Cancer Screening  10/27/2021  . Tetanus Vaccine  05/14/2024  .  Hepatitis C: One time screening is recommended by Center for Disease Control  (CDC) for  adults born from [birth year] through 1965.   Completed  . Pneumonia vaccines  Completed   Preventive Care for Adults  A healthy lifestyle and preventive care can promote health and wellness. Preventive health guidelines for adults include the following key practices.  . A routine yearly physical is a good way to check with your health care provider about your health and preventive screening. It is a chance to share any concerns and updates on your health and to receive a thorough exam.  . Visit your dentist for a routine exam and preventive care every 6 months. Brush your teeth twice a day and floss once a day. Good oral hygiene prevents tooth decay and gum disease.  . The frequency of eye exams is based on your age, health, family medical history, use  of contact lenses, and other factors. Follow your health care provider's recommendations for frequency of eye exams.  . Eat a healthy diet. Foods like vegetables, fruits, whole grains, low-fat dairy products, and lean protein foods contain the nutrients you need without too many calories. Decrease your intake of foods high in solid fats, added sugars, and salt. Eat the right amount of calories for you. Get information about a proper diet from your health care provider, if  necessary.  . Regular physical exercise is one of the most important things you can do for your health. Most adults should get at least 150 minutes of moderate-intensity exercise (any activity that increases your heart rate and causes you to sweat) each week. In addition, most adults need muscle-strengthening exercises on 2 or more days a week.  Silver Sneakers may be a benefit available to you. To determine eligibility, you may visit the website: www.silversneakers.com or contact program at [phone] Mon-Fri between 8AM-8PM.   . Maintain a healthy weight. The body mass index (BMI) is a screening tool to identify possible weight problems. It provides an estimate of body fat based on height and weight. Your health care provider can find your BMI and can help you achieve or maintain a healthy weight.   For adults 20 years and older: ? A BMI below 18.5 is considered underweight. ? A BMI of 18.5 to 24.9 is normal. ? A BMI of 25 to 29.9 is considered overweight. ? A BMI of 30 and above is considered obese.   . Maintain normal blood lipids and cholesterol levels by exercising and minimizing your intake of saturated fat. Eat a balanced diet with plenty of fruit and vegetables. Blood tests for lipids and cholesterol should begin at age 65 and be repeated every 5 years. If your lipid or cholesterol levels are high, you are over 50, or you are at  high risk for heart disease, you may need your cholesterol levels checked more frequently. Ongoing high lipid and cholesterol levels should be treated with medicines if diet and exercise are not working.  . If you smoke, find out from your health care provider how to quit. If you do not use tobacco, please do not start.  . If you choose to drink alcohol, please do not consume more than 2 drinks per day. One drink is considered to be 12 ounces (355 mL) of beer, 5 ounces (148 mL) of wine, or 1.5 ounces (44 mL) of liquor.  . If you are 58-40 years old, ask your  health care provider if you should take aspirin to prevent strokes.  . Use sunscreen. Apply sunscreen liberally and repeatedly throughout the day. You should seek shade when your shadow is shorter than you. Protect yourself by wearing long sleeves, pants, a wide-brimmed hat, and sunglasses year round, whenever you are outdoors.  . Once a month, do a whole body skin exam, using a mirror to look at the skin on your back. Tell your health care provider of new moles, moles that have irregular borders, moles that are larger than a pencil eraser, or moles that have changed in shape or color.

## 2017-05-31 NOTE — Progress Notes (Signed)
PCP notes:   Health maintenance:  No gaps identified.  Abnormal screenings:   Depression score: 3 Depression screen [facility] 2/9 05/31/2017 05/26/2016  Decreased Interest 0 0  Down, Depressed, Hopeless 1 0  PHQ - 2 Score 1 0  Altered sleeping 0 -  Tired, decreased energy 1 -  Change in appetite 0 -  Feeling bad or failure about yourself  0 -  Trouble concentrating 1 -  Moving slowly or fidgety/restless 0 -  Suicidal thoughts 0 -  PHQ-9 Score 3 -  Difficult doing work/chores Not difficult at all -   Mini-Cog score: 19/20 MMSE - Mini Mental State Exam 05/31/2017 05/26/2016  Orientation to time 5 5  Orientation to Place 5 5  Registration 3 3  Attention/ Calculation 0 0  Recall 2 3  Recall-comments unable to recall 1 of 3 words -  Language- name 2 objects 0 0  Language- repeat 1 1  Language- follow 3 step command 3 3  Language- read & follow direction 0 0  Write a sentence 0 0  Copy design 0 0  Total score 19 20    Patient concerns:   PCP please discuss ED medications other than Cialis with patient at next appt. Cialis caused muscle spasms in back.   Nurse concerns:  None  Next PCP appt:   06/07/17 @ 0845  I reviewed health advisor's note, was available for consultation on the day of service listed in this note, and agree with documentation and plan. Elsie Stain, MD.

## 2017-06-07 ENCOUNTER — Encounter: Payer: Self-pay | Admitting: Family Medicine

## 2017-06-07 ENCOUNTER — Ambulatory Visit (INDEPENDENT_AMBULATORY_CARE_PROVIDER_SITE_OTHER): Payer: Medicare Other | Admitting: Family Medicine

## 2017-06-07 VITALS — BP 142/80 | HR 77 | Temp 98.1°F | Ht 68.0 in | Wt 162.5 lb

## 2017-06-07 DIAGNOSIS — Z7189 Other specified counseling: Secondary | ICD-10-CM

## 2017-06-07 DIAGNOSIS — I10 Essential (primary) hypertension: Secondary | ICD-10-CM | POA: Diagnosis not present

## 2017-06-07 DIAGNOSIS — R972 Elevated prostate specific antigen [PSA]: Secondary | ICD-10-CM

## 2017-06-07 DIAGNOSIS — I350 Nonrheumatic aortic (valve) stenosis: Secondary | ICD-10-CM

## 2017-06-07 DIAGNOSIS — I359 Nonrheumatic aortic valve disorder, unspecified: Secondary | ICD-10-CM

## 2017-06-07 MED ORDER — AMLODIPINE BESYLATE 10 MG PO TABS: ORAL_TABLET | ORAL | 3 refills | Status: DC

## 2017-06-07 NOTE — Progress Notes (Signed)
PSA per urology.  I'll defer.  PSA was ~12 per patient report.  He is still seeing Dr. Eliberto Ivory.   He has had prostate biopsy x2 w/o cancer seen.  He didn't tolerate cialis.  D/w pt.  He didn't want more aggressive tx for ED.    Hypertension:    Using medication without problems or lightheadedness: yes Chest pain with exertion: no Edema:no Short of breath: with exertion- dec in exercise tolerance in the last year Labs d/w pt.   H/o AS noted.    Advance directive- wife designated if patient were incapacitated  Meds, vitals, and allergies reviewed.   PMH and SH reviewed  ROS: Per HPI unless specifically indicated in ROS section   GEN: nad, alert and oriented HEENT: mucous membranes moist NECK: supple w/o LA CV: rrr. SEM noted that radiates to the B carotids.  PULM: ctab, no inc wob ABD: soft, +bs EXT: no edema SKIN: no acute rash

## 2017-06-07 NOTE — Patient Instructions (Signed)
Don't change your meds for now.  Call about a follow up appointment with Dr. [name].  We will call about your referral.  Rosaria Ferries or Azalee Course will call you if you don't see one of them on the way out.  Take care.  Glad to see you.

## 2017-06-08 ENCOUNTER — Other Ambulatory Visit: Payer: Self-pay

## 2017-06-08 ENCOUNTER — Ambulatory Visit (INDEPENDENT_AMBULATORY_CARE_PROVIDER_SITE_OTHER): Payer: Medicare Other

## 2017-06-08 DIAGNOSIS — I350 Nonrheumatic aortic (valve) stenosis: Secondary | ICD-10-CM | POA: Diagnosis not present

## 2017-06-09 ENCOUNTER — Encounter: Payer: Self-pay | Admitting: Family Medicine

## 2017-06-09 DIAGNOSIS — R972 Elevated prostate specific antigen [PSA]: Secondary | ICD-10-CM | POA: Insufficient documentation

## 2017-06-09 NOTE — Assessment & Plan Note (Signed)
He has relative decrease in exercise tolerance in the past year.  Discussed with patient about previous aortic changes.  Recheck echo.  Exam as noted above.  Pathophysiology discussed with patient.  Okay for outpatient follow-up.  No chest pain. >25 minutes spent in face to face time with patient, >50% spent in counselling or coordination of care.

## 2017-06-09 NOTE — Assessment & Plan Note (Signed)
Continue current medications for now.  Labs discussed with patient.  Discussed with patient about diet and exercise.

## 2017-06-09 NOTE — Assessment & Plan Note (Signed)
Advance directive- wife designated if patient were incapacitated.  

## 2017-06-09 NOTE — Assessment & Plan Note (Signed)
Per urology.  I will defer. He agrees.

## 2017-07-09 NOTE — Progress Notes (Signed)
Cardiology Office Note  Date:  07/11/2017   ID:  Brett Mcguire, DOB [birth year], MRN [id number]  PCP:  Tonia Ghent, MD   Chief Complaint  Patient presents with  . Other    12 month follow up. Patient c/o SOB. Meds reviewed verbally with patient.     HPI:  Mr. Brett Mcguire is a very pleasant 72 year old gentleman with prior history of  smoking for 17 years,  17 years of secondhand exposure per the patient,  lung cancer with resection of the right upper lobe in 1989,  quit smoking in 1984,  chronic mild shortness of breath   who presents for follow-up of his aortic valve disease, aortic valve stenosis.  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  ejection fraction was in the range of 60  to 65%.  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 Transvalvular velocity was increased. There   was moderate to severe stenosis. Mean gradient (S): 27 mm Hg. Peak gradient 45 mm Hg, peak velocity 335 cm/s  Echo in 12/2014 Possibly bicuspid; moderately thickened, mildly calcified leaflets. There was mild to moderate stenosis. There was trivial regurgitation. Peak velocity 311, Peak gradient (S): 39 mm Hg.  EKG personally reviewed by myself on todays visit Normal sinus rhythm with rate 62 bpm no significant ST or T-wave changes  Other past medical history reviewed Prior severe episode of vertigo one month ago lasting for several minutes No recurrence    PMH:   has a past medical history of Allergy (1989), BCC (basal cell carcinoma of skin), Diverticulosis (02/2000), Hyperlipidemia (1989), Hypertension, Lung cancer ([place]) (1989), and Murmur.  PSH:    Past Surgical History:  Procedure Laterality Date  . COLONOSCOPY  02/18/2000   Polyp, diverticulosis, repeat in 5 years  . COLONOSCOPY  03/03/2006    Repeat  -  Divertics  . Laminectomy/Discectomy  11/03/2005   L5/S1  . Malignant melanoma excision  06/20/2003   Dr. Acquanetta Sit  . MRI  10/09/2005   Lumbar spine, bulging disc L5/S1  . Right lobectomy  1989   Lung cancer  . TONSILLECTOMY AND ADENOIDECTOMY      Current Outpatient Medications  Medication Sig Dispense Refill  . amLODipine (NORVASC) 10 MG tablet TAKE 1 TABLET BY MOUTH DAILY 90 tablet 3  . Probiotic Product (ALIGN) 4 MG CAPS Take by mouth daily.    . Triamcinolone Acetonide (TRIAMCINOLONE 0.1 % CREAM : EUCERIN) CREA Apply 1 application topically 2 (two) times daily. Disp 1 jar (Patient taking differently: Apply 1 application topically as needed. Disp 1 jar) 1 each 1   No current facility-administered medications for this visit.     Allergies:   Alfuzosin; Cialis [tadalafil]; Doxazosin; Ephedrine; Finasteride; Flomax [tamsulosin hcl]; Rapaflo [silodosin]; and Sulfonamide derivatives   Social History:  The patient  reports that he quit smoking about 35 years ago. His smoking use included cigarettes. He has a 34.00 pack-year smoking history. He has never used smokeless tobacco. He reports that he drinks about 4.2 oz of alcohol per week. He reports that he does not use drugs.   Family History:   family history includes Alcohol abuse in his mother; Cancer in his father, mother, and other; Prostate cancer in his father.    Review of Systems: Review of Systems  Constitutional: Negative.   Respiratory: Negative.  Cardiovascular: Negative.   Gastrointestinal: Negative.   Musculoskeletal: Negative.   Neurological: Negative.   Psychiatric/Behavioral: Negative.   All other systems reviewed and are negative.    PHYSICAL EXAM: VS:  BP 114/66 (BP Location: Left Arm, Patient Position: Sitting, Cuff Size: Normal)   Pulse 62   Ht 5\' 8"  (1.727 m)   Wt 162 lb 8 oz (73.7 kg)   BMI 24.71 kg/m  , BMI Body mass index is 24.71 kg/m. Constitutional:  oriented to person, place, and  time. No distress.  HENT:  Head: Normocephalic and atraumatic.  Eyes:  no discharge. No scleral icterus.  Neck: Normal range of motion. Neck supple. No JVD present.  Cardiovascular: Normal rate, regular rhythm, normal heart sounds and intact distal pulses. Exam reveals no gallop and no friction rub. No edema No murmur heard. Pulmonary/Chest: Effort normal and breath sounds normal. No stridor. No respiratory distress.  no wheezes.  no rales.  no tenderness.  Abdominal: Soft.  no distension.  no tenderness.  Musculoskeletal: Normal range of motion.  no  tenderness or deformity.  Neurological:  normal muscle tone. Coordination normal. No atrophy Skin: Skin is warm and dry. No rash noted. not diaphoretic.  Psychiatric:  normal mood and affect. behavior is normal. Thought content normal.    Recent Labs: 05/31/2017: ALT 12; BUN 15; Creatinine, Ser 0.83; Potassium 4.2; Sodium 133    Lipid Panel Lab Results  Component Value Date   CHOL 179 05/31/2017   HDL 67.50 05/31/2017   LDLCALC 96 05/31/2017   TRIG 82.0 05/31/2017      Wt Readings from Last 3 Encounters:  07/11/17 162 lb 8 oz (73.7 kg)  06/07/17 162 lb 8 oz (73.7 kg)  05/31/17 160 lb 4 oz (72.7 kg)       ASSESSMENT AND PLAN:  Aortic valve disease - Plan: EKG 12-Lead Moderate aortic valve stenosis in 2016 No significant change on recent echocardiogram May 2019 No significant change in peak gradient were peak velocity Relatively asymptomatic Chronic fatigue likely unrelated Repeat echocardiogram in one year  Essential hypertension - Plan: EKG 12-Lead Blood pressure is well controlled on today's visit. No changes made to the medications. Stable  Chronic fatigue We have placed lab work order for TSH and free testosterone Reports having adequate sleep  Mixed hyperlipidemia - Plan: EKG 12-Lead does not want a cholesterol medication Discussed again very screening studies for coronary disease  does not want CT coronary  calcium scoring  Carotid arterial stenosis Mild bilateral disease on study in 2017, less than 39% Does not want any other screening test  Shortness of breath Long history of smoking, prior lung cancer  Disposition:   F/U  12 months   Total encounter time more than 25 minutes  Greater than 50% was spent in counseling and coordination of care with the patient    Orders Placed This Encounter  Procedures  . EKG 12-Lead     Signed, Esmond Plants, M.D., Ph.D. 07/11/2017  [place], [place]

## 2017-07-11 ENCOUNTER — Encounter: Payer: Self-pay | Admitting: Cardiovascular Disease

## 2017-07-11 ENCOUNTER — Ambulatory Visit (INDEPENDENT_AMBULATORY_CARE_PROVIDER_SITE_OTHER): Payer: Medicare Other | Admitting: Cardiovascular Disease

## 2017-07-11 VITALS — BP 114/66 | HR 62 | Ht 68.0 in | Wt 162.5 lb

## 2017-07-11 DIAGNOSIS — N529 Male erectile dysfunction, unspecified: Secondary | ICD-10-CM | POA: Diagnosis not present

## 2017-07-11 DIAGNOSIS — I10 Essential (primary) hypertension: Secondary | ICD-10-CM | POA: Diagnosis not present

## 2017-07-11 DIAGNOSIS — E782 Mixed hyperlipidemia: Secondary | ICD-10-CM | POA: Diagnosis not present

## 2017-07-11 DIAGNOSIS — I359 Nonrheumatic aortic valve disorder, unspecified: Secondary | ICD-10-CM | POA: Diagnosis not present

## 2017-07-11 DIAGNOSIS — R5383 Other fatigue: Secondary | ICD-10-CM | POA: Diagnosis not present

## 2017-07-11 DIAGNOSIS — R0602 Shortness of breath: Secondary | ICD-10-CM

## 2017-07-11 DIAGNOSIS — I6523 Occlusion and stenosis of bilateral carotid arteries: Secondary | ICD-10-CM

## 2017-07-11 NOTE — Patient Instructions (Addendum)
Medication Instructions:   No medication changes made  Labwork:  TSH and free testosterone ([facility] to go)  Testing/Procedures:  Echo in one year for aortic valve stenosis   Follow-Up: It was a pleasure seeing you in the office today. Please call us if you have new issues that need to be addressed before your next appt.  [phone]  Your physician wants you to follow-up in: 12 months.  You will receive a reminder letter in the mail two months in advance. If you don't receive a letter, please call our office to schedule the follow-up appointment.  If you need a refill on your cardiac medications before your next appointment, please call your pharmacy.  For educational health videos Log in to : [email or web address] Or : [email or web address], password : [email or web address]

## 2017-07-19 ENCOUNTER — Other Ambulatory Visit: Payer: Self-pay | Admitting: Cardiovascular Disease

## 2017-07-19 DIAGNOSIS — R5383 Other fatigue: Secondary | ICD-10-CM | POA: Diagnosis not present

## 2017-07-19 DIAGNOSIS — N529 Male erectile dysfunction, unspecified: Secondary | ICD-10-CM | POA: Diagnosis not present

## 2017-07-20 LAB — TESTOSTERONE,FREE AND TOTAL
Testosterone, Free: 3.7 pg/mL — ABNORMAL LOW (ref 6.6–18.1)
Testosterone: 413 ng/dL (ref 264–916)

## 2017-07-20 LAB — TSH: TSH: 1.99 u[IU]/mL (ref 0.450–4.500)

## 2017-08-02 ENCOUNTER — Encounter: Payer: Self-pay | Admitting: Family Medicine

## 2017-08-02 ENCOUNTER — Ambulatory Visit (INDEPENDENT_AMBULATORY_CARE_PROVIDER_SITE_OTHER): Payer: Medicare Other | Admitting: Family Medicine

## 2017-08-02 VITALS — BP 120/62 | HR 66 | Temp 97.9°F | Ht 68.0 in | Wt 162.5 lb

## 2017-08-02 DIAGNOSIS — I359 Nonrheumatic aortic valve disorder, unspecified: Secondary | ICD-10-CM

## 2017-08-02 DIAGNOSIS — I6523 Occlusion and stenosis of bilateral carotid arteries: Secondary | ICD-10-CM

## 2017-08-02 DIAGNOSIS — R5383 Other fatigue: Secondary | ICD-10-CM | POA: Diagnosis not present

## 2017-08-02 MED ORDER — LORATADINE 10 MG PO TABS: 10.0000 mg | ORAL_TABLET | Freq: Every day | ORAL | Status: DC

## 2017-08-02 NOTE — Patient Instructions (Addendum)
Try claritin but avoid claritin D.  Go to the lab on the way out.  We'll contact you with your lab report. Take care.  Glad to see you.

## 2017-08-02 NOTE — Progress Notes (Signed)
He tried zyrtec and it helped but it makes him drowsy.   Discussed claritin trial but avoiding claritin D.   He last saw urology in the last few months.  H/o elevated PSA. He has had prostate biopsy x2 w/o cancer seen.  He has fatigue noted.  He had testosterone level drawn by urology.  Total level is normal but percent free was slightly low.  He had cardiology evaluation regarding his murmur and I will defer.  Discussed with patient.  Meds, vitals, and allergies reviewed.   ROS: Per HPI unless specifically indicated in ROS section   GEN: nad, alert and oriented HEENT: mucous membranes moist NECK: supple w/o LA CV: rrr. Murmur noted.  PULM: ctab, no inc wob ABD: soft, +bs EXT: no edema

## 2017-08-04 DIAGNOSIS — R5383 Other fatigue: Secondary | ICD-10-CM | POA: Insufficient documentation

## 2017-08-04 NOTE — Assessment & Plan Note (Signed)
Per cards.  I'll defer.

## 2017-08-04 NOTE — Assessment & Plan Note (Signed)
Recheck testosterone level.  Given his PSA changes previously if he is going to have anything done about this it would likely need to come through urology.  Discussed with patient.  He agrees.  Rationale discussed with patient.

## 2017-08-05 LAB — TESTOS,TOTAL,FREE AND SHBG (FEMALE)
Free Testosterone: 38.7 pg/mL (ref 30.0–135.0)
Sex Hormone Binding: 67 nmol/L (ref 22–77)
Testosterone, Total, LC-MS-MS: 430 ng/dL (ref 250–1100)

## 2017-08-07 ENCOUNTER — Telehealth: Payer: Self-pay | Admitting: Family Medicine

## 2017-08-07 NOTE — Telephone Encounter (Signed)
Notify patient.  His total testosterone was not low.  His free testosterone was not low.  I doubt that urology will be enthused about putting him on testosterone replacement but if he is going to do anything about this it would have to come through them.  I'll defer to them.  I do not see anything significantly abnormal that needs intervention at this point. Thanks.

## 2017-08-08 NOTE — Telephone Encounter (Signed)
Patient advised.

## 2017-09-21 DIAGNOSIS — L259 Unspecified contact dermatitis, unspecified cause: Secondary | ICD-10-CM | POA: Diagnosis not present

## 2017-09-21 DIAGNOSIS — Z1283 Encounter for screening for malignant neoplasm of skin: Secondary | ICD-10-CM | POA: Diagnosis not present

## 2017-09-21 DIAGNOSIS — D485 Neoplasm of uncertain behavior of skin: Secondary | ICD-10-CM | POA: Diagnosis not present

## 2017-09-21 DIAGNOSIS — L821 Other seborrheic keratosis: Secondary | ICD-10-CM | POA: Diagnosis not present

## 2017-09-21 DIAGNOSIS — L820 Inflamed seborrheic keratosis: Secondary | ICD-10-CM | POA: Diagnosis not present

## 2017-09-21 DIAGNOSIS — Z85820 Personal history of malignant melanoma of skin: Secondary | ICD-10-CM | POA: Diagnosis not present

## 2017-09-21 DIAGNOSIS — B354 Tinea corporis: Secondary | ICD-10-CM | POA: Diagnosis not present

## 2017-09-21 DIAGNOSIS — Z85828 Personal history of other malignant neoplasm of skin: Secondary | ICD-10-CM | POA: Diagnosis not present

## 2017-09-21 DIAGNOSIS — L578 Other skin changes due to chronic exposure to nonionizing radiation: Secondary | ICD-10-CM | POA: Diagnosis not present

## 2017-09-29 ENCOUNTER — Other Ambulatory Visit: Payer: Self-pay | Admitting: Family Medicine

## 2017-09-30 NOTE — Telephone Encounter (Signed)
Electronic refill request. TAC cream Last office visit:   08/02/17 Last Filled:    1 each 1 05/20/2016  Please advise.

## 2017-10-02 NOTE — Telephone Encounter (Signed)
Sent. Thanks.   

## 2017-11-08 ENCOUNTER — Ambulatory Visit (INDEPENDENT_AMBULATORY_CARE_PROVIDER_SITE_OTHER): Payer: Medicare Other | Admitting: Family Medicine

## 2017-11-08 ENCOUNTER — Encounter: Payer: Self-pay | Admitting: Family Medicine

## 2017-11-08 VITALS — BP 144/80 | HR 67 | Temp 98.1°F | Wt 165.2 lb

## 2017-11-08 DIAGNOSIS — Z23 Encounter for immunization: Secondary | ICD-10-CM

## 2017-11-08 DIAGNOSIS — R238 Other skin changes: Secondary | ICD-10-CM

## 2017-11-08 DIAGNOSIS — I6523 Occlusion and stenosis of bilateral carotid arteries: Secondary | ICD-10-CM

## 2017-11-08 DIAGNOSIS — R195 Other fecal abnormalities: Secondary | ICD-10-CM

## 2017-11-08 DIAGNOSIS — R233 Spontaneous ecchymoses: Secondary | ICD-10-CM

## 2017-11-08 LAB — CBC WITH DIFFERENTIAL/PLATELET
Basophils Absolute: 0.1 10*3/uL (ref 0.0–0.1)
Basophils Relative: 1 % (ref 0.0–3.0)
Eosinophils Absolute: 0.2 10*3/uL (ref 0.0–0.7)
Eosinophils Relative: 3 % (ref 0.0–5.0)
HCT: 39.4 % (ref 39.0–52.0)
Hemoglobin: 13.6 g/dL (ref 13.0–17.0)
Lymphocytes Relative: 27.2 % (ref 12.0–46.0)
Lymphs Abs: 1.7 10*3/uL (ref 0.7–4.0)
MCHC: 34.5 g/dL (ref 30.0–36.0)
MCV: 93.9 fl (ref 78.0–100.0)
Monocytes Absolute: 0.7 10*3/uL (ref 0.1–1.0)
Monocytes Relative: 10.7 % (ref 3.0–12.0)
Neutro Abs: 3.7 10*3/uL (ref 1.4–7.7)
Neutrophils Relative %: 58.1 % (ref 43.0–77.0)
Platelets: 317 10*3/uL (ref 150.0–400.0)
RBC: 4.2 Mil/uL — ABNORMAL LOW (ref 4.22–5.81)
RDW: 13.7 % (ref 11.5–15.5)
WBC: 6.3 10*3/uL (ref 4.0–10.5)

## 2017-11-08 MED ORDER — TRIAMCINOLONE 0.1 % CREAM:EUCERIN CREAM 1:1: 1.0000 "application " | TOPICAL_CREAM | Freq: Two times a day (BID) | CUTANEOUS | Status: DC

## 2017-11-08 MED ORDER — TRIAMCINOLONE ACETONIDE 0.1 % EX CREA: TOPICAL_CREAM | CUTANEOUS | Status: DC

## 2017-11-08 NOTE — Progress Notes (Signed)
Still with dec in libido and ED noted.  Most recent T labs were not low.  He is following up re: PSA with urology.  He was hesitant to try other interventions at this point.    He has noted easy bruising and more bleeding with a skin nick.  No gum or nose bleeding.  No blood in urine.   September- he had athlete's foot.  Used topical lotrimin and got better.   Then he had ringworm.  Didn't use oral lamisil per derm clinic.  Used topical lotrimin and got better.   He had sick dog at home with vomiting and diarrhea.   Then he had diarrhea and nausea but no vomiting.  He used loperamide briefly, then pepto bismol.   Interval constipation, then hemorrhoid irritation, then diarrhea.  He had one black stool after taking pepto bismol.  No sx since.   He hasn't seen worms in this stool.    No FCNAVD.    Meds, vitals, and allergies reviewed.   ROS: Per HPI unless specifically indicated in ROS section   GEN: nad, alert and oriented HEENT: mucous membranes moist NECK: supple w/o LA CV: rrr.  SEM noted.  PULM: ctab, no inc wob ABD: soft, +bs EXT: no edema SKIN: no acute rash

## 2017-11-08 NOTE — Patient Instructions (Addendum)
Go to the lab on the way out.  We'll contact you with your lab report. Don't change your meds for now.  Take care.  Glad to see you.  

## 2017-11-10 DIAGNOSIS — R238 Other skin changes: Secondary | ICD-10-CM | POA: Insufficient documentation

## 2017-11-10 DIAGNOSIS — R233 Spontaneous ecchymoses: Secondary | ICD-10-CM | POA: Insufficient documentation

## 2017-11-10 DIAGNOSIS — R195 Other fecal abnormalities: Secondary | ICD-10-CM | POA: Insufficient documentation

## 2017-11-10 NOTE — Assessment & Plan Note (Signed)
Check CBC but I expect this to be a benign issue.  Discussed with patient.

## 2017-11-10 NOTE — Assessment & Plan Note (Signed)
Reasonable to check stool heme and GI pathogen panel but I expect his diarrhea and GI symptoms to improve and I expect the one dark stool to have been related to Pepto-Bismol use.  Okay for outpatient follow-up. >25 minutes spent in face to face time with patient, >50% spent in counselling or coordination of care.

## 2017-11-11 ENCOUNTER — Other Ambulatory Visit: Payer: Medicare Other

## 2017-11-11 ENCOUNTER — Other Ambulatory Visit (INDEPENDENT_AMBULATORY_CARE_PROVIDER_SITE_OTHER): Payer: Medicare Other

## 2017-11-11 DIAGNOSIS — R195 Other fecal abnormalities: Secondary | ICD-10-CM | POA: Diagnosis not present

## 2017-11-11 DIAGNOSIS — R197 Diarrhea, unspecified: Secondary | ICD-10-CM | POA: Diagnosis not present

## 2017-11-11 LAB — FECAL OCCULT BLOOD, IMMUNOCHEMICAL: Fecal Occult Bld: POSITIVE — AB

## 2017-11-13 ENCOUNTER — Other Ambulatory Visit: Payer: Self-pay | Admitting: Family Medicine

## 2017-11-13 DIAGNOSIS — R195 Other fecal abnormalities: Secondary | ICD-10-CM

## 2017-11-16 LAB — GASTROINTESTINAL PATHOGEN PANEL PCR
C. difficile Tox A/B, PCR: NOT DETECTED
Campylobacter, PCR: NOT DETECTED
Cryptosporidium, PCR: NOT DETECTED
E coli (ETEC) LT/ST PCR: NOT DETECTED
E coli (STEC) stx1/stx2, PCR: NOT DETECTED
E coli 0157, PCR: NOT DETECTED
Giardia lamblia, PCR: NOT DETECTED
Norovirus, PCR: NOT DETECTED
Rotavirus A, PCR: NOT DETECTED
Salmonella, PCR: NOT DETECTED
Shigella, PCR: NOT DETECTED

## 2017-11-28 DIAGNOSIS — R972 Elevated prostate specific antigen [PSA]: Secondary | ICD-10-CM | POA: Diagnosis not present

## 2017-11-28 DIAGNOSIS — N401 Enlarged prostate with lower urinary tract symptoms: Secondary | ICD-10-CM | POA: Diagnosis not present

## 2017-11-28 DIAGNOSIS — N5201 Erectile dysfunction due to arterial insufficiency: Secondary | ICD-10-CM | POA: Diagnosis not present

## 2017-12-01 ENCOUNTER — Encounter: Payer: Self-pay | Admitting: Gastroenterology

## 2017-12-01 ENCOUNTER — Ambulatory Visit (INDEPENDENT_AMBULATORY_CARE_PROVIDER_SITE_OTHER): Payer: Medicare Other | Admitting: Gastroenterology

## 2017-12-01 VITALS — BP 124/71 | HR 75 | Ht 68.0 in | Wt 165.0 lb

## 2017-12-01 DIAGNOSIS — R195 Other fecal abnormalities: Secondary | ICD-10-CM

## 2017-12-02 ENCOUNTER — Other Ambulatory Visit: Payer: Self-pay

## 2017-12-02 DIAGNOSIS — R195 Other fecal abnormalities: Secondary | ICD-10-CM

## 2017-12-02 NOTE — Progress Notes (Signed)
Brett Mcguire [address]  [place]  [place], [place] [address]  Main: [phone]  Fax: [phone]   Gastroenterology Consultation  Referring Provider:     Tonia Ghent, MD Primary Care Physician:  Tonia Ghent, MD Primary Gastroenterologist:  Dr. Vonda Mcguire Reason for Consultation:     Positive fecal occult blood test        HPI:    Chief Complaint  Patient presents with  . New Patient (Initial Visit)    referred by Dr. Orlinda Blalock for Heme positive stool    TREYLON Mcguire is a 72 y.o. y/o male referred for consultation & management  by Dr. Damita Dunnings, Elveria Rising, MD.  Patient reports history he has developed again about 3 to 4 months ago and self resolved after a few days.  States since then he has had formed bowel movements for the most part, but has constipation on some days, and some days with 1-2 loose stools.  No blood in stool.  No weight loss.  No abdominal pain.  No nausea or vomiting.  No dysphagia or heartburn.  Primary care provider ordered a GI panel which was negative including C. difficile, done on November 1.  Fecal occult blood test was also a letter and this was positive.  Patient denies any family history of colon cancer.   Last colonoscopy was in 2013 due to history of polyps and diverticuli and hemorrhoids were reported. 2008 colonoscopy also done due to history of adenomatous polyps, With diverticulosis reported. 2002 colonoscopy for family history of colon cancer, with 6 cm sigmoid colon polyp removed via hot biopsy forceps and pathology showed hyperplastic polyp.  Even though the 2002 screening colonoscopy reports family history of colon cancer, patient states his father had cancer in multiple places, he does not recall the primary but thinks it was prostate.  Past Medical History:  Diagnosis Date  . Allergy 1989  . 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 Diverticulosis 02/2000  . Hyperlipidemia 1989  . Hypertension    . Lung cancer ([place]) 1989   s/p lobectomy, no chemo or rady  . Murmur    aortic stenosis    Past Surgical History:  Procedure Laterality Date  . COLONOSCOPY  02/18/2000   Polyp, diverticulosis, repeat in 5 years  . COLONOSCOPY  03/03/2006   Repeat  -  Divertics  . Laminectomy/Discectomy  11/03/2005   L5/S1  . Malignant melanoma excision  06/20/2003   Dr. Acquanetta Sit  . MRI  10/09/2005   Lumbar spine, bulging disc L5/S1  . Right lobectomy  1989   Lung cancer  . TONSILLECTOMY AND ADENOIDECTOMY      Prior to Admission medications   Medication Sig Start Date End Date Taking? Authorizing Provider  amLODipine (NORVASC) 10 MG tablet TAKE 1 TABLET BY MOUTH DAILY 06/07/17   Tonia Ghent, MD  Probiotic Product (ALIGN) 4 MG CAPS Take by mouth daily.    [provider]  triamcinolone cream (KENALOG) 0.1 % APPLY TO AFFECTED AREAS ON LOWER LEG DAILY AS NEEDED FOR ITCHING. 11/08/17   Tonia Ghent, MD    Family History  Problem Relation Age of Onset  . Cancer Mother        Bell throat  . Alcohol abuse Mother   . Cancer Father        4 kinds; 5 places, prostate and lung  . Prostate cancer Father  possible prostate cancer  . Cancer Other        Skin, deceased in 29-Mar-1994  . Colon cancer Neg Hx        none known     Social History   Tobacco Use  . Smoking status: Former Smoker    Packs/day: 2.00    Years: 17.00    Pack years: 34.00    Types: Cigarettes    Last attempt to quit: 02/22/1982    Years since quitting: 35.8  . Smokeless tobacco: Never Used  Substance Use Topics  . Alcohol use: Yes    Alcohol/week: 7.0 standard drinks    Types: 7 Glasses of wine per week    Comment: wine  . Drug use: No    Allergies as of 12/01/2017 - Review Complete 12/01/2017  Allergen Reaction Noted  . Alfuzosin  12/13/2013  . Cialis [tadalafil] Other (See Comments) 02/24/2015  . Doxazosin  12/13/2013  . Ephedrine Other (See Comments) 04/02/2013  . Finasteride  12/13/2013    . Flomax [tamsulosin hcl]  05/04/2013  . Rapaflo [silodosin]  05/04/2013  . Sulfonamide derivatives  04/19/2007    Review of Systems:    All systems reviewed and negative except where noted in HPI.   Physical Exam:  BP 124/71   Pulse 75   Ht 5\' 8"  (1.727 m)   Wt 165 lb (74.8 kg)   BMI 25.09 kg/m   No LMP for male patient. Psych:  Alert and cooperative. Normal mood and affect. General:   Alert,  Well-developed, well-nourished, pleasant and cooperative in NAD Head:  Normocephalic and atraumatic. Eyes:  Sclera clear, no icterus.   Conjunctiva pink. Ears:  Normal auditory acuity. Nose:  No deformity, discharge, or lesions. Mouth:  No deformity or lesions,oropharynx pink & moist. Neck:  Supple; no masses or thyromegaly. Abdomen:  Normal bowel sounds.  No bruits.  Soft, non-tender and non-distended without masses, hepatosplenomegaly or hernias noted.  No guarding or rebound tenderness.    Msk:  Symmetrical without gross deformities. Good, equal movement & strength bilaterally. Pulses:  Normal pulses noted. Extremities:  No clubbing or edema.  No cyanosis. Neurologic:  Alert and oriented x3;  grossly normal neurologically. Skin:  Intact without significant lesions or rashes. No jaundice. Lymph Nodes:  No significant cervical adenopathy. Psych:  Alert and cooperative. Normal mood and affect.   Labs: CBC    Component Value Date/Time   WBC 6.3 11/08/2017 1319   RBC 4.20 (L) 11/08/2017 1319   HGB 13.6 11/08/2017 1319   HGB 13.9 12/28/2013 1408   HCT 39.4 11/08/2017 1319   HCT 42.5 12/28/2013 1408   PLT 317.0 11/08/2017 1319   PLT 273 12/28/2013 1408   MCV 93.9 11/08/2017 1319   MCV 97 12/28/2013 1408   MCH 31.9 12/28/2013 1408   MCHC 34.5 11/08/2017 1319   RDW 13.7 11/08/2017 1319   RDW 13.2 12/28/2013 1408   LYMPHSABS 1.7 11/08/2017 1319   LYMPHSABS 1.7 12/28/2013 1408   MONOABS 0.7 11/08/2017 1319   MONOABS 0.6 12/28/2013 1408   EOSABS 0.2 11/08/2017 1319   EOSABS  0.1 12/28/2013 1408   BASOSABS 0.1 11/08/2017 1319   BASOSABS 0.0 12/28/2013 1408   CMP     Component Value Date/Time   NA 133 (L) 05/31/2017 1041   NA 139 12/28/2013 1408   K 4.2 05/31/2017 1041   K 4.7 12/28/2013 1408   CL 98 05/31/2017 1041   CL 105 12/28/2013 1408   CO2 27 05/31/2017 1041  CO2 25 12/28/2013 1408   GLUCOSE 112 (H) 05/31/2017 1041   GLUCOSE 119 (H) 12/28/2013 1408   BUN 15 05/31/2017 1041   BUN 17 12/28/2013 1408   CREATININE 0.83 05/31/2017 1041   CREATININE 1.02 12/28/2013 1408   CALCIUM 9.6 05/31/2017 1041   CALCIUM 8.5 12/28/2013 1408   PROT 7.3 05/31/2017 1041   ALBUMIN 4.2 05/31/2017 1041   AST 16 05/31/2017 1041   ALT 12 05/31/2017 1041   ALKPHOS 48 05/31/2017 1041   BILITOT 0.7 05/31/2017 1041   GFRNONAA >60 12/28/2013 1408   GFRNONAA >60 03/10/2013 1104   GFRAA >60 12/28/2013 1408   GFRAA >60 03/10/2013 1104    Imaging Studies: No results found.  Assessment and Plan:   SHANNEN VERNON is a 72 y.o. y/o male has been referred for positive fecal occult blood test  Positive fecal occult blood test indicates colonoscopy to rule out colon cancer We discussed that this can also be positive in the setting of hemorrhoids, or can be false positive Risk benefits of procedure discussed with patient is agreeable to proceeding with colonoscopy  His previous sigmoid polyp was not an adenoma as per the pathology report However, there is a documented history in the chart about possible family history of colon cancer which patient states he does not know what the primary malignancy was in his father.  Therefore, as far as his repeat screening/surveillance goes, he may need screening every 5 years due to his possible family history even if his colon does not show any polyps.  His diarrhea has completely resolved and was transient and was likely infectious in nature He reports constipation intermittently and I have asked him to increase fiber in his diet and  use MiraLAX daily if fiber intake does not lead to 1-2 soft bowel movements daily.  Dr Brett Mcguire  Speech recognition software was used to dictate the above note.

## 2017-12-11 ENCOUNTER — Encounter: Payer: Self-pay | Admitting: Family Medicine

## 2017-12-11 DIAGNOSIS — R197 Diarrhea, unspecified: Secondary | ICD-10-CM | POA: Insufficient documentation

## 2017-12-22 ENCOUNTER — Other Ambulatory Visit: Payer: Self-pay

## 2017-12-22 ENCOUNTER — Encounter
Admission: RE | Disposition: A | Discharge status: Home / Self Care | Payer: Self-pay | Source: Ambulatory Visit | Attending: Gastroenterology

## 2017-12-22 ENCOUNTER — Ambulatory Visit: Payer: Medicare Other | Admitting: Anesthesiology

## 2017-12-22 ENCOUNTER — Ambulatory Visit
Admission: RE | Admit: 2017-12-22 | Discharge: 2017-12-22 | Disposition: A | Discharge status: Home / Self Care | Payer: Medicare Other | Source: Ambulatory Visit | Attending: Gastroenterology | Admitting: Gastroenterology

## 2017-12-22 DIAGNOSIS — K5730 Diverticulosis of large intestine without perforation or abscess without bleeding: Secondary | ICD-10-CM | POA: Diagnosis not present

## 2017-12-22 DIAGNOSIS — D120 Benign neoplasm of cecum: Secondary | ICD-10-CM

## 2017-12-22 DIAGNOSIS — Z85828 Personal history of other malignant neoplasm of skin: Secondary | ICD-10-CM | POA: Diagnosis not present

## 2017-12-22 DIAGNOSIS — Z85118 Personal history of other malignant neoplasm of bronchus and lung: Secondary | ICD-10-CM | POA: Diagnosis not present

## 2017-12-22 DIAGNOSIS — K5790 Diverticulosis of intestine, part unspecified, without perforation or abscess without bleeding: Secondary | ICD-10-CM | POA: Diagnosis not present

## 2017-12-22 DIAGNOSIS — R195 Other fecal abnormalities: Secondary | ICD-10-CM | POA: Diagnosis not present

## 2017-12-22 DIAGNOSIS — I350 Nonrheumatic aortic (valve) stenosis: Secondary | ICD-10-CM | POA: Diagnosis not present

## 2017-12-22 DIAGNOSIS — Z902 Acquired absence of lung [part of]: Secondary | ICD-10-CM | POA: Diagnosis not present

## 2017-12-22 DIAGNOSIS — Z882 Allergy status to sulfonamides status: Secondary | ICD-10-CM | POA: Insufficient documentation

## 2017-12-22 DIAGNOSIS — Z87891 Personal history of nicotine dependence: Secondary | ICD-10-CM | POA: Diagnosis not present

## 2017-12-22 DIAGNOSIS — Z79899 Other long term (current) drug therapy: Secondary | ICD-10-CM | POA: Insufficient documentation

## 2017-12-22 DIAGNOSIS — I10 Essential (primary) hypertension: Secondary | ICD-10-CM | POA: Diagnosis not present

## 2017-12-22 DIAGNOSIS — E785 Hyperlipidemia, unspecified: Secondary | ICD-10-CM | POA: Diagnosis not present

## 2017-12-22 DIAGNOSIS — D125 Benign neoplasm of sigmoid colon: Secondary | ICD-10-CM | POA: Diagnosis not present

## 2017-12-22 DIAGNOSIS — K635 Polyp of colon: Secondary | ICD-10-CM

## 2017-12-22 DIAGNOSIS — K648 Other hemorrhoids: Secondary | ICD-10-CM | POA: Diagnosis not present

## 2017-12-22 DIAGNOSIS — Z888 Allergy status to other drugs, medicaments and biological substances status: Secondary | ICD-10-CM | POA: Insufficient documentation

## 2017-12-22 DIAGNOSIS — D126 Benign neoplasm of colon, unspecified: Secondary | ICD-10-CM | POA: Diagnosis not present

## 2017-12-22 HISTORY — PX: COLONOSCOPY WITH PROPOFOL: SHX5780

## 2017-12-22 HISTORY — DX: Nonrheumatic aortic (valve) stenosis: I35.0

## 2017-12-22 SURGERY — COLONOSCOPY WITH PROPOFOL
Anesthesia: General

## 2017-12-22 MED ORDER — PROPOFOL 10 MG/ML IV BOLUS
INTRAVENOUS | Status: DC | PRN
  Administered 2017-12-22 (×3): 36.3 mg via INTRAVENOUS

## 2017-12-22 MED ORDER — LIDOCAINE HCL (PF) 2 % IJ SOLN
INTRAMUSCULAR | Status: AC
  Filled 2017-12-22: qty 10

## 2017-12-22 MED ORDER — PROPOFOL 500 MG/50ML IV EMUL
INTRAVENOUS | Status: AC
  Filled 2017-12-22: qty 50

## 2017-12-22 MED ORDER — PHENYLEPHRINE HCL 10 MG/ML IJ SOLN
INTRAMUSCULAR | Status: DC | PRN
  Administered 2017-12-22 (×3): 100 ug via INTRAVENOUS

## 2017-12-22 MED ORDER — SODIUM CHLORIDE 0.9 % IV SOLN
INTRAVENOUS | Status: DC
  Administered 2017-12-22: 10:00:00 via INTRAVENOUS

## 2017-12-22 MED ORDER — PROPOFOL 500 MG/50ML IV EMUL
INTRAVENOUS | Status: DC | PRN
  Administered 2017-12-22: 20 ug/kg/min via INTRAVENOUS

## 2017-12-22 NOTE — Op Note (Signed)
[facility] Gastroenterology Patient Name: Brett Mcguire Procedure Date: 12/22/2017 10:07 AM MRN: [id number] Account #: [id number] Date of Birth: 09-08-1945 Admit Type: Outpatient Age: 72 Room: [facility] ENDO ROOM 2 Gender: Male Note Status: Finalized Procedure:            Colonoscopy Indications:          Gastrointestinal occult blood loss Providers:            Varnita B. Bonna Gains MD, MD Referring MD:         Elveria Rising. Damita Dunnings, MD (Referring MD) Medicines:            Monitored Anesthesia Care Complications:        No immediate complications. Procedure:            Pre-Anesthesia Assessment:                       - ASA Grade Assessment: II - A patient with mild                        systemic disease.                       - Prior to the procedure, a History and Physical was                        performed, and patient medications, allergies and                        sensitivities were reviewed. The patient's tolerance of                        previous anesthesia was reviewed.                       - The risks and benefits of the procedure and the                        sedation options and risks were discussed with the                        patient. All questions were answered and informed                        consent was obtained.                       - Patient identification and proposed procedure were                        verified prior to the procedure by the physician, the                        nurse, the anesthesiologist, the anesthetist and the                        technician. The procedure was verified in the procedure                        room.  After obtaining informed consent, the colonoscope was                        passed under direct vision. Throughout the procedure,                        the patient's blood pressure, pulse, and oxygen                        saturations were monitored continuously. The              Colonoscope was introduced through the anus and                        advanced to the the cecum, identified by appendiceal                        orifice and ileocecal valve. The colonoscopy was                        performed with ease. The patient tolerated the                        procedure well. The quality of the bowel preparation                        was good. Findings:      The perianal and digital rectal examinations were normal.      A 3 mm polyp was found in the cecum. The polyp was sessile. The polyp       was removed with a cold biopsy forceps. Resection and retrieval were       complete.      Two sessile polyps were found in the cecum. The polyps were 4 to 5 mm in       size. These polyps were removed with a cold snare. Resection and       retrieval were complete.      Four sessile polyps were found in the sigmoid colon. The polyps were 4       to 5 mm in size. These polyps were removed with a cold snare. Resection       and retrieval were complete.      The exam was otherwise without abnormality.      Multiple diverticula were found in the sigmoid colon and ascending colon.      The rectum, sigmoid colon, descending colon, transverse colon, ascending       colon and cecum appeared normal.      Non-bleeding internal hemorrhoids were found during retroflexion. Impression:           - One 3 mm polyp in the cecum, removed with a cold                        biopsy forceps. Resected and retrieved.                       - Two 4 to 5 mm polyps in the cecum, removed with a                        cold snare. Resected and retrieved.                       -  Four 4 to 5 mm polyps in the sigmoid colon, removed                        with a cold snare. Resected and retrieved.                       - The examination was otherwise normal.                       - Diverticulosis in the sigmoid colon and in the                        ascending colon.                       -  The rectum, sigmoid colon, descending colon,                        transverse colon, ascending colon and cecum are normal.                       - Non-bleeding internal hemorrhoids. Recommendation:       - Discharge patient to home (with escort).                       - Advance diet as tolerated.                       - Continue present medications.                       - Await pathology results.                       - Repeat colonoscopy in 3 years.                       - The findings and recommendations were discussed with                        the patient.                       - The findings and recommendations were discussed with                        the patient's family.                       - Return to primary care physician as previously                        scheduled.                       - High fiber diet. Procedure Code(s):    --- Professional ---                       [phone], Colonoscopy, flexible; with removal of tumor(s),                        polyp(s), or other lesion(s) by snare technique  [id number], 59, Colonoscopy, flexible; with biopsy, single                        or multiple Diagnosis Code(s):    --- Professional ---                       D12.0, Benign neoplasm of cecum                       D12.5, Benign neoplasm of sigmoid colon                       K64.8, Other hemorrhoids                       R19.5, Other fecal abnormalities                       K57.30, Diverticulosis of large intestine without                        perforation or abscess without bleeding CPT copyright 2018 American Medical Association. All rights reserved. The codes documented in this report are preliminary and upon coder review may  be revised to meet current compliance requirements.  Vonda Antigua, MD Margretta Sidle B. Bonna Gains MD, MD 12/22/2017 11:05:06 AM This report has been signed electronically. Number of Addenda: 0 Note Initiated On: 12/22/2017 10:07  AM Scope Withdrawal Time: 0 hours 26 minutes 48 seconds  Total Procedure Duration: 0 hours 32 minutes 19 seconds  Estimated Blood Loss: Estimated blood loss: none.      [facility]

## 2017-12-22 NOTE — Anesthesia Postprocedure Evaluation (Signed)
Anesthesia Post Note  Patient: Brett Mcguire  Procedure(s) Performed: COLONOSCOPY WITH PROPOFOL (N/A )  Patient location during evaluation: Endoscopy Anesthesia Type: General Level of consciousness: awake and alert Pain management: pain level controlled Vital Signs Assessment: post-procedure vital signs reviewed and stable Respiratory status: spontaneous breathing and respiratory function stable Cardiovascular status: stable Anesthetic complications: no     Last Vitals:  Vitals:   12/22/17 0932 12/22/17 1100  BP: (!) 148/85 (!) 130/116  Pulse: 74 (!) 55  Resp: 18 12  Temp: (!) 36.1 C (!) 36 C  SpO2: 97% 98%    Last Pain:  Vitals:   12/22/17 1100  TempSrc: Tympanic  PainSc: Asleep                 Suheily Birks K

## 2017-12-22 NOTE — Anesthesia Preprocedure Evaluation (Addendum)
Anesthesia Evaluation  Patient identified by MRN, date of birth, ID band Patient awake    Reviewed: Allergy & Precautions, NPO status , Patient's Chart, lab work & pertinent test results  History of Anesthesia Complications Negative for: history of anesthetic complications  Airway Mallampati: II       Dental   Pulmonary neg sleep apnea, neg COPD, former smoker,           Cardiovascular hypertension, Pt. on medications + Valvular Problems/Murmurs (moderate AS, no tx cuently) AS      Neuro/Psych neg Seizures    GI/Hepatic Neg liver ROS, neg GERD  ,  Endo/Other  neg diabetes  Renal/GU negative Renal ROS     Musculoskeletal   Abdominal   Peds  Hematology   Anesthesia Other Findings   Reproductive/Obstetrics                           Anesthesia Physical Anesthesia Plan  ASA: III  Anesthesia Plan: General   Post-op Pain Management:    Induction: Intravenous  PONV Risk Score and Plan: 2 and Propofol infusion and TIVA  Airway Management Planned: Nasal Cannula  Additional Equipment:   Intra-op Plan:   Post-operative Plan:   Informed Consent: I have reviewed the patients History and Physical, chart, labs and discussed the procedure including the risks, benefits and alternatives for the proposed anesthesia with the patient or authorized representative who has indicated his/her understanding and acceptance.     Plan Discussed with:   Anesthesia Plan Comments:         Anesthesia Quick Evaluation

## 2017-12-22 NOTE — Transfer of Care (Signed)
Immediate Anesthesia Transfer of Care Note  Patient: Brett Mcguire  Procedure(s) Performed: COLONOSCOPY WITH PROPOFOL (N/A )  Patient Location: PACU and Endoscopy Unit  Anesthesia Type:General  Level of Consciousness: sedated  Airway & Oxygen Therapy: Patient Spontanous Breathing  Post-op Assessment: Report given to RN  Post vital signs: stable  Last Vitals:  Vitals Value Taken Time  BP 130/116 12/22/2017 11:02 AM  Temp 36 C 12/22/2017 11:00 AM  Pulse 72 12/22/2017 11:02 AM  Resp 12 12/22/2017 11:02 AM  SpO2 99 % 12/22/2017 11:02 AM  Vitals shown include unvalidated device data.  Last Pain:  Vitals:   12/22/17 1100  TempSrc: Tympanic  PainSc: Asleep         Complications: No apparent anesthesia complications

## 2017-12-22 NOTE — Anesthesia Post-op Follow-up Note (Signed)
Anesthesia QCDR form completed.        

## 2017-12-22 NOTE — H&P (Signed)
Vonda Antigua, MD [address] [place], [place], [place], [address] [id number], [place], [place], [place], [address] Phone: [phone]  Fax: [phone]  Primary Care Physician:  Tonia Ghent, MD   Pre-Procedure History & Physical: HPI:  Brett Mcguire is a 72 y.o. male is here for a colonoscopy.   Past Medical History:  Diagnosis Date  . Allergy 1989  . Aortic stenosis   . 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 Diverticulosis 02/2000  . Hyperlipidemia 1989  . Hypertension   . Lung cancer ([place]) 1989   s/p lobectomy, no chemo or rady  . Murmur    aortic stenosis    Past Surgical History:  Procedure Laterality Date  . COLONOSCOPY  02/18/2000   Polyp, diverticulosis, repeat in 5 years  . COLONOSCOPY  03/03/2006   Repeat  -  Divertics  . Laminectomy/Discectomy  11/03/2005   L5/S1  . Malignant melanoma excision  06/20/2003   Dr. Acquanetta Sit  . MRI  10/09/2005   Lumbar spine, bulging disc L5/S1  . Right lobectomy  1989   Lung cancer  . TONSILLECTOMY AND ADENOIDECTOMY      Prior to Admission medications   Medication Sig Start Date End Date Taking? Authorizing Provider  amLODipine (NORVASC) 10 MG tablet TAKE 1 TABLET BY MOUTH DAILY 06/07/17  Yes Tonia Ghent, MD  Probiotic Product (ALIGN) 4 MG CAPS Take by mouth daily.   Yes [provider]  triamcinolone cream (KENALOG) 0.1 % APPLY TO AFFECTED AREAS ON LOWER LEG DAILY AS NEEDED FOR ITCHING. 11/08/17   Tonia Ghent, MD    Allergies as of 12/02/2017 - Review Complete 12/01/2017  Allergen Reaction Noted  . Alfuzosin  12/13/2013  . Cialis [tadalafil] Other (See Comments) 02/24/2015  . Doxazosin  12/13/2013  . Ephedrine Other (See Comments) 04/02/2013  . Finasteride  12/13/2013  . Flomax [tamsulosin hcl]  05/04/2013  . Rapaflo [silodosin]  05/04/2013  . Sulfonamide derivatives  04/19/2007    Family History  Problem Relation Age of Onset  . Cancer Mother        [place] throat    . Alcohol abuse Mother   . Cancer Father        4 kinds; 5 places, prostate and lung  . Prostate cancer Father        possible prostate cancer  . Cancer Other        Skin, deceased in 03/30/1994  . Colon cancer Neg Hx        none known    Social History   Socioeconomic History  . Marital status: Married    Spouse name: Not on file  . Number of children: 2  . Years of education: Not on file  . Highest education level: Not on file  Occupational History  . 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 Financial resource strain: Not on file  . Food insecurity:    Worry: Not on file    Inability: Not on file  . Transportation needs:    Medical: Not on file    Non-medical: Not on file  Tobacco Use  . Smoking status: Former Smoker    Packs/day: 2.00    Years: 17.00    Pack years: 34.00    Types: Cigarettes    Last attempt to quit: 02/22/1982    Years since quitting: 35.8  . Smokeless  tobacco: Never Used  Substance and Sexual Activity  . Alcohol use: Yes    Alcohol/week: 10.0 standard drinks    Types: 10 Glasses of wine per week    Comment: wine  . Drug use: No  . Sexual activity: Yes  Lifestyle  . Physical activity:    Days per week: Not on file    Minutes per session: Not on file  . Stress: Not on file  Relationships  . Social connections:    Talks on phone: Not on file    Gets together: Not on file    Attends religious service: Not on file    Active member of club or organization: Not on file    Attends meetings of clubs or organizations: Not on file    Relationship status: Not on file  . Intimate partner violence:    Fear of current or ex partner: Not on file    Emotionally abused: Not on file    Physically abused: Not on file    Forced sexual activity: Not on file  Other Topics Concern  . 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    Married 40+ years    Review of Systems: See HPI, otherwise negative ROS  Physical Exam: BP (!) 148/85   Pulse 74   Temp (!) 97 F (36.1 C) (Tympanic)   Resp 18   Ht 5\' 8"  (1.727 m)   Wt 72.6 kg   SpO2 97%   BMI 24.33 kg/m  General:   Alert,  pleasant and cooperative in NAD Head:  Normocephalic and atraumatic. Neck:  Supple; no masses or thyromegaly. Lungs:  Clear throughout to auscultation, normal respiratory effort.    Heart:  +S1, +S2, Regular rate and rhythm, No edema. Abdomen:  Soft, nontender and nondistended. Normal bowel sounds, without guarding, and without rebound.   Neurologic:  Alert and  oriented x4;  grossly normal neurologically.  Impression/Plan: Donzetta Starch is here for a colonoscopy to be performed for positive fobt  Risks, benefits, limitations, and alternatives regarding  colonoscopy have been reviewed with the patient.  Questions have been answered.  All parties agreeable.   Virgel Manifold, MD  12/22/2017, 10:18 AM

## 2017-12-26 LAB — SURGICAL PATHOLOGY

## 2017-12-27 ENCOUNTER — Encounter: Payer: Self-pay | Admitting: Gastroenterology

## 2018-01-19 ENCOUNTER — Ambulatory Visit (INDEPENDENT_AMBULATORY_CARE_PROVIDER_SITE_OTHER): Payer: Medicare Other | Admitting: Family Medicine

## 2018-01-19 ENCOUNTER — Encounter: Payer: Self-pay | Admitting: Family Medicine

## 2018-01-19 DIAGNOSIS — M25519 Pain in unspecified shoulder: Secondary | ICD-10-CM | POA: Diagnosis not present

## 2018-01-19 NOTE — Patient Instructions (Signed)
Likely rotator cuff irritation.  Use the exercises and update me as needed.  If you aren't better then ask about seeing Dr. Lorelei Pont.  Take care.  Glad to see you.

## 2018-01-19 NOTE — Assessment & Plan Note (Signed)
Likely rotator cuff irritation.  Use the exercises and update me as needed.  Handout given and discussed with patient about home exercise program. If not better then he'll ask about seeing Dr. Lorelei Pont.  He agrees with plan.

## 2018-01-19 NOTE — Progress Notes (Signed)
Shoulder pain.  He was pulled walking dogs and moved awkwardly.  Going on for 6 weeks.  Using OTC liniment.  Variable level of pain, positional, pain sleeping at night.  No neck pain.  Normal grip.  No L sided.  No FCNAVD.  No other fall or injury.    Meds, vitals, and allergies reviewed.   ROS: Per HPI unless specifically indicated in ROS section   GEN: nad, alert and oriented NECK: supple w/o LA R shoulder with pain on ext > int rotation.  AC not ttp  no arm drop.  + impingement.  Dec pain and inc ROM on scap manipulation.  Distally NV intact.

## 2018-05-09 ENCOUNTER — Other Ambulatory Visit: Payer: Self-pay | Admitting: Family Medicine

## 2018-05-09 NOTE — Telephone Encounter (Signed)
Pharmacy requests refill on: Triamcinolone cream  LAST REFILL: 11/08/17 0 additional refills LAST OV: 02/08/18 acute shoulder pain/ 11/09/18 dark stools NEXT OV: 06/08/18 AWV  PHARMACY: [facility]

## 2018-05-10 NOTE — Telephone Encounter (Signed)
Sent. Thanks.   

## 2018-05-29 DIAGNOSIS — N401 Enlarged prostate with lower urinary tract symptoms: Secondary | ICD-10-CM | POA: Diagnosis not present

## 2018-05-29 DIAGNOSIS — R972 Elevated prostate specific antigen [PSA]: Secondary | ICD-10-CM | POA: Diagnosis not present

## 2018-05-29 DIAGNOSIS — N5201 Erectile dysfunction due to arterial insufficiency: Secondary | ICD-10-CM | POA: Diagnosis not present

## 2018-06-05 ENCOUNTER — Other Ambulatory Visit: Payer: Self-pay | Admitting: Family Medicine

## 2018-06-05 DIAGNOSIS — I10 Essential (primary) hypertension: Secondary | ICD-10-CM

## 2018-06-06 ENCOUNTER — Other Ambulatory Visit: Payer: Self-pay

## 2018-06-06 ENCOUNTER — Other Ambulatory Visit (INDEPENDENT_AMBULATORY_CARE_PROVIDER_SITE_OTHER): Payer: Medicare Other

## 2018-06-06 ENCOUNTER — Ambulatory Visit (INDEPENDENT_AMBULATORY_CARE_PROVIDER_SITE_OTHER): Payer: Medicare Other

## 2018-06-06 DIAGNOSIS — Z0000 Encounter for general adult medical examination without abnormal findings: Secondary | ICD-10-CM | POA: Diagnosis not present

## 2018-06-06 DIAGNOSIS — I10 Essential (primary) hypertension: Secondary | ICD-10-CM

## 2018-06-06 LAB — COMPREHENSIVE METABOLIC PANEL
ALT: 11 U/L (ref 0–53)
AST: 17 U/L (ref 0–37)
Albumin: 3.9 g/dL (ref 3.5–5.2)
Alkaline Phosphatase: 50 U/L (ref 39–117)
BUN: 14 mg/dL (ref 6–23)
CO2: 26 mEq/L (ref 19–32)
Calcium: 9.5 mg/dL (ref 8.4–10.5)
Chloride: 97 mEq/L (ref 96–112)
Creatinine, Ser: 0.92 mg/dL (ref 0.40–1.50)
GFR: 80.76 mL/min (ref >60.00)
Glucose, Bld: 97 mg/dL (ref 70–99)
Potassium: 4.1 mEq/L (ref 3.5–5.1)
Sodium: 130 mEq/L — ABNORMAL LOW (ref 135–145)
Total Bilirubin: 0.5 mg/dL (ref 0.2–1.2)
Total Protein: 6.7 g/dL (ref 6.0–8.3)

## 2018-06-06 LAB — LIPID PANEL
Cholesterol: 177 mg/dL (ref 0–200)
HDL: 53.4 mg/dL (ref >39.00)
LDL Cholesterol: 95 mg/dL (ref 0–99)
NonHDL: 123.75
Total CHOL/HDL Ratio: 3
Triglycerides: 142 mg/dL (ref 0.0–149.0)
VLDL: 28.4 mg/dL (ref 0.0–40.0)

## 2018-06-06 NOTE — Progress Notes (Signed)
Subjective:   Brett Mcguire is a 73 y.o. male who presents for Medicare Annual/Subsequent preventive examination.  Review of Systems:  N/A Cardiac Risk Factors include: advanced age (>58men, >35 women);male gender     Objective:    Vitals: There were no vitals taken for this visit.  There is no height or weight on file to calculate BMI.  Advanced Directives 06/06/2018 12/22/2017 05/31/2017 05/26/2016  Does Patient Have a Medical Advance Directive? No No Yes No  Type of Advance Directive - - Brett Mcguire in Chart? - - No - copy requested -  Would patient like information on creating a medical advance directive? No - Patient declined No - Patient declined - -    Tobacco Social History   Tobacco Use  Smoking Status Former Smoker  . Packs/day: 2.00  . Years: 17.00  . Pack years: 34.00  . Types: Cigarettes  . Last attempt to quit: 02/22/1982  . Years since quitting: 36.3  Smokeless Tobacco Never Used     Counseling given: No   Clinical Intake:  Pre-visit preparation completed: Yes  Pain : No/denies pain Pain Score: 0-No pain     Nutritional Status: BMI 25 -29 Overweight Nutritional Risks: None Diabetes: No  How often do you need to have someone help you when you read instructions, pamphlets, or other written materials from your doctor or pharmacy?: 1 - Never What is the last grade level you completed in school?: 12th grade  Interpreter Needed?: No  Comments: pt lives with spouse Information entered by :: LPinson, LPN  Past Medical History:  Diagnosis Date  . Allergy 1989  . Aortic stenosis   . 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 Diverticulosis 02/2000  . Hyperlipidemia 1989  . Hypertension   . Lung cancer ([facility]) 1989   s/p lobectomy, no chemo or rady  . Murmur    aortic stenosis   Past Surgical History:  Procedure Laterality Date  . COLONOSCOPY  02/18/2000   Polyp, diverticulosis,  repeat in 5 years  . COLONOSCOPY  03/03/2006   Repeat  -  Divertics  . COLONOSCOPY WITH PROPOFOL N/A 12/22/2017   Procedure: COLONOSCOPY WITH PROPOFOL;  Surgeon: Brett Manifold, MD;  Location: [facility] ENDOSCOPY;  Service: Endoscopy;  Laterality: N/A;  . Laminectomy/Discectomy  11/03/2005   L5/S1  . Malignant melanoma excision  06/20/2003   Dr. Acquanetta Mcguire  . MRI  10/09/2005   Lumbar spine, bulging disc L5/S1  . Right lobectomy  1989   Lung cancer  . TONSILLECTOMY AND ADENOIDECTOMY     Family History  Problem Relation Age of Onset  . Cancer Mother        [place] throat  . Alcohol abuse Mother   . Cancer Father        4 kinds; 5 places, prostate and lung  . Prostate cancer Father        possible prostate cancer  . Cancer Other        Skin, deceased in 27-Mar-1994  . Colon cancer Neg Hx        none known   Social History   Socioeconomic History  . Marital status: Married    Spouse name: Not on file  . Number of children: 2  . Years of education: Not on file  . Highest education level: Not on file  Occupational History  . 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 Financial resource strain: Not on file  . Food insecurity:    Worry: Not on file    Inability: Not on file  . Transportation needs:    Medical: Not on file    Non-medical: Not on file  Tobacco Use  . Smoking status: Former Smoker    Packs/day: 2.00    Years: 17.00    Pack years: 34.00    Types: Cigarettes    Last attempt to quit: 02/22/1982    Years since quitting: 36.3  . Smokeless tobacco: Never Used  Substance and Sexual Activity  . Alcohol use: Yes    Alcohol/week: 10.0 standard drinks    Types: 10 Glasses of wine per week    Comment: wine  . Drug use: No  . Sexual activity: Yes  Lifestyle  . Physical activity:    Days per week: Not on file    Minutes per session: Not on file  . Stress: Not on file  Relationships  . Social connections:     Talks on phone: Not on file    Gets together: Not on file    Attends religious service: Not on file    Active member of club or organization: Not on file    Attends meetings of clubs or organizations: Not on file    Relationship status: Not on file  Other Topics Concern  . 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 amLODipine (NORVASC) 10 MG tablet TAKE 1 TABLET BY MOUTH DAILY  . Probiotic Product (ALIGN) 4 MG CAPS Take by mouth daily.  Marland Kitchen triamcinolone cream (KENALOG) 0.1 % APPLY TO AFFECTED AREAS ON LOWER LEG EVERY DAY AS NEEDED FOR ITCHING   No facility-administered encounter medications on file as of 06/06/2018.     Activities of Daily Living In your present state of health, do you have any difficulty performing the following activities: 06/06/2018  Hearing? N  Vision? N  Difficulty concentrating or making decisions? N  Walking or climbing stairs? N  Dressing or bathing? N  Doing errands, shopping? N  Preparing Food and eating ? N  Using the Toilet? N  In the past six months, have you accidently leaked urine? N  Do you have problems with loss of bowel control? N  Managing your Medications? N  Managing your Finances? N  Housekeeping or managing your Housekeeping? N  Some recent data might be hidden    Patient Care Team: Brett Ghent, MD as PCP - General (Family Medicine) Brett Merritts, MD as Consulting Physician (Cardiology) Brett Cowper, MD (Urology)   Assessment:   This is a routine wellness examination for Brett Mcguire.  Vision Screening Comments: Vision exam in 2018 with Dr. Gloriann Mcguire  Exercise Activities and Dietary recommendations Current Exercise Habits: Home exercise routine, Time (Minutes): 60, Frequency (Times/Week): 7, Weekly Exercise (Minutes/Week): 420, Intensity: Moderate, Exercise  limited by: None identified  Goals    . Increase physical activity     Starting 06/06/2018, I will continue to walk up to 2 miles daily.        Fall Risk Fall Risk  06/06/2018 05/31/2017 05/26/2016 09/09/2015  Falls in the past year? 0 No No No  Comment - - -  Emmi Telephone Survey: data to providers prior to load   Depression Screen PHQ 2/9 Scores 06/06/2018 05/31/2017 05/26/2016  PHQ - 2 Score 0 1 0  PHQ- 9 Score 0 3 -    Cognitive Function MMSE - Mini Mental State Exam 06/06/2018 05/31/2017 05/26/2016  Orientation to time 5 5 5   Orientation to Place 5 5 5   Registration 3 3 3   Attention/ Calculation 0 0 0  Recall 3 2 3   Recall-comments - unable to recall 1 of 3 words -  Language- name 2 objects 0 0 0  Language- repeat 1 1 1   Language- follow 3 step command 0 3 3  Language- read & follow direction 0 0 0  Write a sentence 0 0 0  Copy design 0 0 0  Total score 17 19 20        PLEASE NOTE: A Mini-Cog screen was completed. Maximum score is 17. A value of 0 denotes this part of Folstein MMSE was not completed or the patient failed this part of the Mini-Cog screening.   Mini-Cog Screening Orientation to Time - Max 5 pts Orientation to Place - Max 5 pts Registration - Max 3 pts Recall - Max 3 pts Language Repeat - Max 1 pts    Immunization History  Administered Date(s) Administered  . Influenza,inj,Quad PF,6+ Mos 11/01/2016, 11/08/2017  . Influenza-Unspecified 10/31/2014, 11/13/2014, 12/01/2015  . Pneumococcal Conjugate-13 02/24/2015  . Pneumococcal Polysaccharide-23 01/11/1994  . Pneumococcal-Unspecified 01/16/2012  . Td 02/09/2006  . Tdap 05/15/2014  . Zoster 08/11/2013    Screening Tests Health Maintenance  Topic Date Due  . INFLUENZA VACCINE  08/12/2018  . COLONOSCOPY  12/22/2020  . TETANUS/TDAP  05/14/2024  . Hepatitis C Screening  Completed  . PNA vac Low Risk Adult  Completed       Plan:     I have personally reviewed, addressed, and noted the following in  the patient's chart:  A. Medical and social history B. Use of alcohol, tobacco or illicit drugs  C. Current medications and supplements D. Functional ability and status E.  Nutritional status F.  Physical activity G. Advance directives H. List of other physicians I.  Hospitalizations, surgeries, and ER visits in previous 12 months J.  Vitals (unless it is a telemedicine encounter) K. Screenings to include cognitive, depression, hearing, vision (NOTE: hearing and vision screenings not completed in telemedicine encounter) L. Referrals and appointments   In addition, I have reviewed and discussed with patient certain preventive protocols, quality metrics, and best practice recommendations. A written personalized care plan for preventive services and recommendations were provided to patient.  With patient's permission, we connected on 06/06/18 at 11:00 AM EDT by a video enabled telemedicine application. Two patient identifiers were used to ensure the encounter occurred with the correct person.    Patient was in home and writer was in office.   Signed,   Lindell Noe, MHA, BS, LPN Health Coach

## 2018-06-06 NOTE — Patient Instructions (Signed)
Brett Mcguire , Thank you for taking time to come for your Medicare Wellness Visit. I appreciate your ongoing commitment to your health goals. Please review the following plan we discussed and let me know if I can assist you in the future.   These are the goals we discussed: Goals    . Increase physical activity     Starting 06/06/2018, I will continue to walk up to 2 miles daily.        This is a list of the screening recommended for you and due dates:  Health Maintenance  Topic Date Due  . Flu Shot  08/12/2018  . Colon Cancer Screening  12/22/2020  . Tetanus Vaccine  05/14/2024  .  Hepatitis C: One time screening is recommended by Center for Disease Control  (CDC) for  adults born from [birth year] through 1965.   Completed  . Pneumonia vaccines  Completed   Preventive Care for Adults  A healthy lifestyle and preventive care can promote health and wellness. Preventive health guidelines for adults include the following key practices.  . A routine yearly physical is a good way to check with your health care provider about your health and preventive screening. It is a chance to share any concerns and updates on your health and to receive a thorough exam.  . Visit your dentist for a routine exam and preventive care every 6 months. Brush your teeth twice a day and floss once a day. Good oral hygiene prevents tooth decay and gum disease.  . The frequency of eye exams is based on your age, health, family medical history, use  of contact lenses, and other factors. Follow your health care provider's recommendations for frequency of eye exams.  . Eat a healthy diet. Foods like vegetables, fruits, whole grains, low-fat dairy products, and lean protein foods contain the nutrients you need without too many calories. Decrease your intake of foods high in solid fats, added sugars, and salt. Eat the right amount of calories for you. Get information about a proper diet from your health care provider, if  necessary.  . Regular physical exercise is one of the most important things you can do for your health. Most adults should get at least 150 minutes of moderate-intensity exercise (any activity that increases your heart rate and causes you to sweat) each week. In addition, most adults need muscle-strengthening exercises on 2 or more days a week.  Silver Sneakers may be a benefit available to you. To determine eligibility, you may visit the website: www.silversneakers.com or contact program at 770-012-6855 Mon-Fri between 8AM-8PM.   . Maintain a healthy weight. The body mass index (BMI) is a screening tool to identify possible weight problems. It provides an estimate of body fat based on height and weight. Your health care provider can find your BMI and can help you achieve or maintain a healthy weight.   For adults 20 years and older: ? A BMI below 18.5 is considered underweight. ? A BMI of 18.5 to 24.9 is normal. ? A BMI of 25 to 29.9 is considered overweight. ? A BMI of 30 and above is considered obese.   . Maintain normal blood lipids and cholesterol levels by exercising and minimizing your intake of saturated fat. Eat a balanced diet with plenty of fruit and vegetables. Blood tests for lipids and cholesterol should begin at age 65 and be repeated every 5 years. If your lipid or cholesterol levels are high, you are over 50, or you are at  high risk for heart disease, you may need your cholesterol levels checked more frequently. Ongoing high lipid and cholesterol levels should be treated with medicines if diet and exercise are not working.  . If you smoke, find out from your health care provider how to quit. If you do not use tobacco, please do not start.  . If you choose to drink alcohol, please do not consume more than 2 drinks per day. One drink is considered to be 12 ounces (355 mL) of beer, 5 ounces (148 mL) of wine, or 1.5 ounces (44 mL) of liquor.  . If you are 37-25 years old, ask your  health care provider if you should take aspirin to prevent strokes.  . Use sunscreen. Apply sunscreen liberally and repeatedly throughout the day. You should seek shade when your shadow is shorter than you. Protect yourself by wearing long sleeves, pants, a wide-brimmed hat, and sunglasses year round, whenever you are outdoors.  . Once a month, do a whole body skin exam, using a mirror to look at the skin on your back. Tell your health care provider of new moles, moles that have irregular borders, moles that are larger than a pencil eraser, or moles that have changed in shape or color.

## 2018-06-06 NOTE — Progress Notes (Signed)
PCP notes:   Health maintenance:  No gaps identified.  Abnormal screenings:   None  Patient concerns:   None  Nurse concerns:  None  Next PCP appt:   06/08/18 @ 0945  I reviewed health advisor's note, was available for consultation on the day of service listed in this note, and agree with documentation and plan. Elsie Stain, MD.

## 2018-06-08 ENCOUNTER — Encounter: Payer: Self-pay | Admitting: Family Medicine

## 2018-06-08 ENCOUNTER — Ambulatory Visit (INDEPENDENT_AMBULATORY_CARE_PROVIDER_SITE_OTHER): Payer: Medicare Other | Admitting: Family Medicine

## 2018-06-08 DIAGNOSIS — I10 Essential (primary) hypertension: Secondary | ICD-10-CM | POA: Diagnosis not present

## 2018-06-08 DIAGNOSIS — Z7189 Other specified counseling: Secondary | ICD-10-CM

## 2018-06-08 DIAGNOSIS — R21 Rash and other nonspecific skin eruption: Secondary | ICD-10-CM

## 2018-06-08 DIAGNOSIS — Z0000 Encounter for general adult medical examination without abnormal findings: Secondary | ICD-10-CM

## 2018-06-08 DIAGNOSIS — I359 Nonrheumatic aortic valve disorder, unspecified: Secondary | ICD-10-CM | POA: Diagnosis not present

## 2018-06-08 DIAGNOSIS — M25519 Pain in unspecified shoulder: Secondary | ICD-10-CM

## 2018-06-08 MED ORDER — AMLODIPINE BESYLATE 10 MG PO TABS: ORAL_TABLET | ORAL | 3 refills | Status: DC

## 2018-06-08 NOTE — Progress Notes (Signed)
Interactive audio and video telecommunications were attempted between this provider and patient, however failed, due to patient having technical difficulties OR patient did not have access to video capability.  We continued and completed visit with audio only.   Virtual Visit via Telephone Note  I connected with patient on 06/08/18 at 10:21 AM by telephone and verified that I am speaking with the correct person using two identifiers.  Location of patient: home  Location of MD: [place] Brett of referring provider (if blank then none associated): Names per persons and role in encounter:  MD: Earlyne Iba, Patient: Brett Mcguire.    I discussed the limitations, risks, security and privacy concerns of performing an evaluation and management service by telephone and the availability of in person appointments. I also discussed with the patient that there may be a patient responsible charge related to this service. The patient expressed understanding and agreed to proceed.  CC: f/u   History of Present Illness:  Flu 2019 Shingles 08/2013 out of clinic.  PNA 2014, updated 2017 Tetanus 2016 Colonoscopy 2019-Repeat colonoscopy in 3 years. PSA deferred to uro.   Advance directive- wife designated if patient were incapacitated.   Hypertension:    Using medication without problems or lightheadedness: yes Chest pain with exertion:no Edema:no Short of breath: only age expected given his prev lung surgery Labs d/w pt.  Lipids improved from prev.    He has mild lower sodium at baseline.   Lab discussed with patient.  He does not appear to be symptomatic from this.  Aortic stenosis.  No BLE edema.  He is going to f/u with cardiology when possible given the pandemic.    He has some rash on the ankles but TAC helps.  Used as needed.  Not used daily.  Prev heme pos stool d/w pt.  No more blood in stool noted by patient. No black stools.  Colonoscopy history noted in the interval.  R  shoulder pain. Some better than prev.  He was prev doing the exercises and had more pain the next AM after exercising.  He isn't resolved.     Observations/Objective: No apparent distress Speech normal.   Assessment and Plan:  Health maintenance Flu 2019 Shingles 08/2013 out of clinic.  PNA 2014, updated 2017 Tetanus 2016 Colonoscopy 2019-Repeat colonoscopy in 3 years. Prev heme pos stool d/w pt.  No more blood in stool noted by patient. No black stools.  Colonoscopy history noted in the interval. PSA deferred to uro.  We will request most recent records from Dr. Rogers Blocker with urology Advance directive- wife designated if patient were incapacitated.    Hypertension:    No change in meds at this point. Labs d/w pt.  Lipids improved from prev.   He has mild lower sodium at baseline.   Lab discussed with patient.  He does not appear to be symptomatic from this.  Aortic stenosis.  No BLE edema.  He is going to f/u with cardiology when possible given the pandemic.  Routine cautions discussed with patient  He has some rash on the ankles but TAC helps.  Used as needed.  Not used daily.  R shoulder pain. Some better than prev.  He was prev doing the exercises and had more pain the next AM after exercising.  He isn't resolved.  We will check on getting the patient set up with Dr. Lorelei Pont about his shoulder pain.     Follow Up Instructions: see Mcguire.  I discussed the assessment and treatment plan with the patient. The patient was provided an opportunity to ask questions and all were answered. The patient agreed with the plan and demonstrated an understanding of the instructions.   The patient was advised to call back or seek an in-person evaluation if the symptoms worsen or if the condition fails to improve as anticipated.  I provided 21 minutes of non-face-to-face time during this encounter.  Elsie Stain, MD

## 2018-06-11 DIAGNOSIS — Z0000 Encounter for general adult medical examination without abnormal findings: Secondary | ICD-10-CM | POA: Insufficient documentation

## 2018-06-11 NOTE — Assessment & Plan Note (Signed)
  He has some rash on the ankles but TAC helps.  Used as needed.  Not used daily.

## 2018-06-11 NOTE — Assessment & Plan Note (Signed)
No BLE edema.  He is going to f/u with cardiology when possible given the pandemic.  Routine cautions discussed with patient

## 2018-06-11 NOTE — Assessment & Plan Note (Signed)
Advance directive- wife designated if patient were incapacitated.  

## 2018-06-11 NOTE — Assessment & Plan Note (Signed)
R shoulder pain. Some better than prev.  He was prev doing the exercises and had more pain the next AM after exercising.  He isn't resolved.  We will check on getting the patient set up with Dr. Lorelei Pont about his shoulder pain.

## 2018-06-11 NOTE — Assessment & Plan Note (Signed)
No change in meds at this point. Labs d/w pt.  Lipids improved from prev.   He has mild lower sodium at baseline.   Lab discussed with patient.  He does not appear to be symptomatic from this.

## 2018-06-11 NOTE — Assessment & Plan Note (Signed)
Flu 2019 Shingles 08/2013 out of clinic.  PNA 2014, updated 2017 Tetanus 2016 Colonoscopy 2019-Repeat colonoscopy in 3 years. Prev heme pos stool d/w pt.  No more blood in stool noted by patient. No black stools.  Colonoscopy history noted in the interval. PSA deferred to uro.  We will request most recent records from Dr. Rogers Blocker with urology Advance directive- wife designated if patient were incapacitated.

## 2018-06-12 ENCOUNTER — Telehealth: Payer: Self-pay | Admitting: Family Medicine

## 2018-06-12 NOTE — Telephone Encounter (Signed)
Called and left patient a voicemail to call us back and schedule appt with Dr. Lorelei Pont.     Appt with Dr. Lorelei Pont, please  Received: Today  Message Contents  Josetta Huddle, CMA  Genella Rife H        Need to get patient set up with Dr. Lorelei Pont about his shoulder pain.

## 2018-06-14 ENCOUNTER — Encounter: Payer: Self-pay | Admitting: Family Medicine

## 2018-06-14 ENCOUNTER — Other Ambulatory Visit: Payer: Self-pay

## 2018-06-14 ENCOUNTER — Ambulatory Visit (INDEPENDENT_AMBULATORY_CARE_PROVIDER_SITE_OTHER)
Admission: RE | Admit: 2018-06-14 | Discharge: 2018-06-14 | Disposition: A | Discharge status: Home / Self Care | Payer: Medicare Other | Source: Ambulatory Visit | Attending: Family Medicine | Admitting: Family Medicine

## 2018-06-14 ENCOUNTER — Ambulatory Visit (INDEPENDENT_AMBULATORY_CARE_PROVIDER_SITE_OTHER): Payer: Medicare Other | Admitting: Family Medicine

## 2018-06-14 VITALS — BP 138/64 | HR 67 | Temp 98.5°F | Ht 68.0 in | Wt 166.2 lb

## 2018-06-14 DIAGNOSIS — M25511 Pain in right shoulder: Secondary | ICD-10-CM

## 2018-06-14 DIAGNOSIS — S46911A Strain of unspecified muscle, fascia and tendon at shoulder and upper arm level, right arm, initial encounter: Secondary | ICD-10-CM | POA: Diagnosis not present

## 2018-06-14 DIAGNOSIS — M7541 Impingement syndrome of right shoulder: Secondary | ICD-10-CM

## 2018-06-14 NOTE — Progress Notes (Signed)
Kaeya Schiffer T. Jossilyn Benda, MD Primary Care and [place] at [facility] [place], [address] Phone: [phone]  FAX: [place] - 73 y.o. male  MRN [id number]  Date of Birth: October 20, 1945  Visit Date: 06/14/2018  PCP: Tonia Ghent, MD  Referred by: Tonia Ghent, MD  Chief Complaint  Patient presents with  . Shoulder Pain    Right   Subjective:   Brett Mcguire is a 73 y.o. very pleasant male patient who presents with the following:  He describes a situation 6 months ago where he was pulled by 2 dogs in the opposite direction.  At the time that happened he had a very difficult time elevating his right shoulder for 2 or 3 weeks.  Since that time he is still had some pain, but he is able to abduct his shoulder and move everything in the right direction.  Does have some pain in his middle to upper trap.  Not having any numbness or tingling.  He did try some home rehab from [facility] directed by his primary care doctor, but this caused more pain.  He did these for about 5 weeks without much significant relief of symptoms.  Today, he tells me that his symptoms are relatively benign, but he does have pain with abduction and internal range of motion.  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  Patient Active Problem List   Diagnosis Date Noted  . Healthcare maintenance 06/11/2018  . Shoulder pain 01/19/2018  . Fecal occult blood test positive   . Benign neoplasm of cecum   . Polyp of sigmoid colon   . Internal hemorrhoids   . Diverticulosis of large intestine without diverticulitis   . Diarrhea 12/11/2017  . Easy bruising 11/10/2017  . Fatigue 08/04/2017  . PSA elevation 06/09/2017  . Medicare annual wellness visit, subsequent  05/31/2017  . Bilateral carotid artery stenosis 05/07/2016  . Shortness of breath 05/07/2016  . Medicare annual wellness visit, initial 02/25/2015  . Advance care planning 02/25/2015  . Bilateral carotid bruits 02/18/2015  . Aortic valve disease 02/18/2015  . Vertigo 01/28/2015  . Olecranon bursitis 05/16/2014  . Rash and nonspecific skin eruption 03/20/2014  . Essential hypertension 01/01/2014  . Seasonal allergies 05/30/2013  . Right leg weakness 04/03/2013  . 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 MELANOMA 04/25/2007  . Hyperlipidemia 04/25/2007  . DIVERTICULOSIS, COLON 04/25/2007  . MICROSCOPIC HEMATURIA 04/25/2007  . Prostatitis 04/25/2007  . HEMATOSPERMIA 04/25/2007  . HYPERGLYCEMIA 04/25/2007    Past Medical History:  Diagnosis Date  . Allergy 1989  . Aortic stenosis   . 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 Diverticulosis 02/2000  . Hyperlipidemia 1989  . Hypertension   . Lung cancer ([place]) 1989   s/p lobectomy, no chemo or rady  . Murmur    aortic stenosis    Past Surgical History:  Procedure Laterality Date  . COLONOSCOPY  02/18/2000   Polyp, diverticulosis, repeat in 5 years  . COLONOSCOPY  03/03/2006   Repeat  -  Divertics  . COLONOSCOPY WITH PROPOFOL N/A 12/22/2017   Procedure: COLONOSCOPY WITH PROPOFOL;  Surgeon: Virgel Manifold, MD;  Location: [facility] ENDOSCOPY;  Service: Endoscopy;  Laterality: N/A;  . Laminectomy/Discectomy  11/03/2005  L5/S1  . Malignant melanoma excision  06/20/2003   Dr. Acquanetta Sit  . MRI  10/09/2005   Lumbar spine, bulging disc L5/S1  . Right lobectomy  1989   Lung cancer  . TONSILLECTOMY AND ADENOIDECTOMY      Social History   Socioeconomic History  . Marital status: Married    Spouse name: Not on file  . Number of children: 2  . Years of education: Not on file  . Highest education level: Not on file  Occupational History  . 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 Financial resource strain: Not on file  . Food insecurity:    Worry: Not on file    Inability: Not on file  . Transportation needs:    Medical: Not on file    Non-medical: Not on file  Tobacco Use  . Smoking status: Former Smoker    Packs/day: 2.00    Years: 17.00    Pack years: 34.00    Types: Cigarettes    Last attempt to quit: 02/22/1982    Years since quitting: 36.3  . Smokeless tobacco: Never Used  Substance and Sexual Activity  . Alcohol use: Yes    Alcohol/week: 10.0 standard drinks    Types: 10 Glasses of wine per week    Comment: wine  . Drug use: No  . Sexual activity: Yes  Lifestyle  . Physical activity:    Days per week: Not on file    Minutes per session: Not on file  . Stress: Not on file  Relationships  . Social connections:    Talks on phone: Not on file    Gets together: Not on file    Attends religious service: Not on file    Active member of club or organization: Not on file    Attends meetings of clubs or organizations: Not on file    Relationship status: Not on file  . Intimate partner violence:    Fear of current or ex partner: Not on file    Emotionally abused: Not on file    Physically abused: Not on file    Forced sexual activity: Not on file  Other Topics Concern  . 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    Married 40+ years    Family History  Problem Relation Age of Onset  . Cancer Mother        [place] throat  . Alcohol abuse Mother   . Cancer Father        4 kinds; 5 places, prostate and lung  . Prostate cancer Father        possible prostate cancer  . Cancer Other        Skin, deceased in Mar 31, 1994  . Colon cancer Neg Hx        none known    Allergies  Allergen Reactions  . Alfuzosin     Skin rash, burning sensation of skin  . Cialis [Tadalafil] Other (See Comments)    heartburn  . Doxazosin     Skin rash, burning  sensation of skin  . Ephedrine Other (See Comments)    BP spikes   . Finasteride     diarrhea  . Flomax [Tamsulosin Hcl]     Rash  . Rapaflo [Silodosin]     rash  .  Sulfonamide Derivatives     REACTION: as child    Medication list reviewed and updated in full in [facility].  GEN: No fevers, chills. Nontoxic. Primarily MSK c/o today. MSK: Detailed in the HPI GI: tolerating PO intake without difficulty Neuro: No numbness, parasthesias, or tingling associated. Otherwise the pertinent positives of the ROS are noted above.   Objective:   BP 138/64   Pulse 67   Temp 98.5 F (36.9 C) (Oral)   Ht 5\' 8"  (1.727 m)   Wt 166 lb 4 oz (75.4 kg)   BMI 25.28 kg/m    GEN: Well-developed,well-nourished,in no acute distress; alert,appropriate and cooperative throughout examination HEENT: Normocephalic and atraumatic without obvious abnormalities. Ears, externally no deformities PULM: Breathing comfortably in no respiratory distress EXT: No clubbing, cyanosis, or edema PSYCH: Normally interactive. Cooperative during the interview. Pleasant. Friendly and conversant. Not anxious or depressed appearing. Normal, full affect.  Shoulder: R Inspection: No muscle wasting or winging Ecchymosis/edema: neg  AC joint, scapula, clavicle: NT Cervical spine: NT, full ROM Spurling's: neg Abduction: full, 5/5 Flexion: full, 5/5 IR, full, lift-off: 5/5 ER at neutral: full, 5/5 AC crossover: mild pos Neer: mild pos Hawkins: mild pos Drop Test: neg Empty Can: nt Supraspinatus insertion: mild-mod T Bicipital groove: NT Speed's: neg Yergason's: neg Sulcus sign: neg Scapular dyskinesis: none C5-T1 intact  Neuro: Sensation intact Grip 5/5   Radiology: Dg Shoulder Right  Result Date: 06/14/2018 CLINICAL DATA:  Right shoulder pain EXAM: RIGHT SHOULDER - 2+ VIEW COMPARISON:  None. 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 Glenohumeral joint is  maintained. No acute bony abnormality. Specifically, no fracture, subluxation, or dislocation. 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 No acute bony abnormality. Electronically Signed   By: Rolm Baptise M.D.   On: 06/14/2018 11:13     Assessment and Plan:   Shoulder strain, right, initial encounter  Acute pain of right shoulder - Plan: DG Shoulder Right, Ambulatory referral to Physical Therapy  Impingement syndrome of right shoulder - Plan: Ambulatory referral to Physical Therapy  Level of Medical Decision-Making in this case is Moderate.  Injury 6 months ago is most consistent with a partial-thickness rotator cuff tear, but that would be healed at this point.  His strength is 5 out of 5 in all directions.  His range of motion is normal.  His exam is reassuring today.  After partial cuff tear, mechanics are altered, now he is having some impingement in the subacromial as well as subcoracoid, and I am can have him do formal physical therapy.  In the setting of 6 months of symptoms, I typically recommend a corticosteroid injection, but the patient preferred not to do this and wanted to be more conservative now.  I think this is entirely reasonable.  Tylenol, NSAIDs, and ice.  Using an anatomical model, I reviewed with the patient the structures involved and how they related to their diagnosis .   I appreciate the opportunity to evaluate this very friendly patient. If you have any question regarding his care or prognosis, do not hesitate to ask.   Follow-up: 6-8 weeks  Orders Placed This Encounter  Procedures  . DG Shoulder Right  . Ambulatory referral to Physical Therapy    Signed,  Frederico Hamman T. Fumi Guadron, MD   Outpatient Encounter Medications as of 06/14/2018  Medication Sig  . amLODipine (NORVASC) 10 MG tablet TAKE 1 TABLET BY MOUTH DAILY  . Probiotic Product (ALIGN) 4 MG  CAPS Take by mouth daily.  Marland Kitchen triamcinolone cream (KENALOG) 0.1 % APPLY TO AFFECTED AREAS ON  LOWER LEG EVERY DAY AS NEEDED FOR ITCHING   No facility-administered encounter medications on file as of 06/14/2018.

## 2018-06-27 DIAGNOSIS — S46011D Strain of muscle(s) and tendon(s) of the rotator cuff of right shoulder, subsequent encounter: Secondary | ICD-10-CM | POA: Diagnosis not present

## 2018-06-27 DIAGNOSIS — M7541 Impingement syndrome of right shoulder: Secondary | ICD-10-CM | POA: Diagnosis not present

## 2018-06-27 DIAGNOSIS — M25511 Pain in right shoulder: Secondary | ICD-10-CM | POA: Diagnosis not present

## 2018-07-06 DIAGNOSIS — M25511 Pain in right shoulder: Secondary | ICD-10-CM | POA: Diagnosis not present

## 2018-07-06 DIAGNOSIS — M7541 Impingement syndrome of right shoulder: Secondary | ICD-10-CM | POA: Diagnosis not present

## 2018-07-06 DIAGNOSIS — S46011D Strain of muscle(s) and tendon(s) of the rotator cuff of right shoulder, subsequent encounter: Secondary | ICD-10-CM | POA: Diagnosis not present

## 2018-07-11 DIAGNOSIS — M25511 Pain in right shoulder: Secondary | ICD-10-CM | POA: Diagnosis not present

## 2018-07-11 DIAGNOSIS — S46011D Strain of muscle(s) and tendon(s) of the rotator cuff of right shoulder, subsequent encounter: Secondary | ICD-10-CM | POA: Diagnosis not present

## 2018-07-11 DIAGNOSIS — M7541 Impingement syndrome of right shoulder: Secondary | ICD-10-CM | POA: Diagnosis not present

## 2018-07-18 DIAGNOSIS — S46011D Strain of muscle(s) and tendon(s) of the rotator cuff of right shoulder, subsequent encounter: Secondary | ICD-10-CM | POA: Diagnosis not present

## 2018-07-18 DIAGNOSIS — M7541 Impingement syndrome of right shoulder: Secondary | ICD-10-CM | POA: Diagnosis not present

## 2018-07-18 DIAGNOSIS — M25511 Pain in right shoulder: Secondary | ICD-10-CM | POA: Diagnosis not present

## 2018-07-26 NOTE — Telephone Encounter (Signed)
Pt was seen by Dr. Lorelei Pont on 06/14/18

## 2018-07-27 DIAGNOSIS — S46011D Strain of muscle(s) and tendon(s) of the rotator cuff of right shoulder, subsequent encounter: Secondary | ICD-10-CM | POA: Diagnosis not present

## 2018-07-27 DIAGNOSIS — M7541 Impingement syndrome of right shoulder: Secondary | ICD-10-CM | POA: Diagnosis not present

## 2018-07-27 DIAGNOSIS — M25511 Pain in right shoulder: Secondary | ICD-10-CM | POA: Diagnosis not present

## 2018-07-28 ENCOUNTER — Encounter: Payer: Self-pay | Admitting: Family Medicine

## 2018-07-28 LAB — LAB REPORT - SCANNED: Prostate Specific Ag, Serum: 8.9

## 2018-08-28 DIAGNOSIS — D2230 Melanocytic nevi of unspecified part of face: Secondary | ICD-10-CM | POA: Diagnosis not present

## 2018-08-28 DIAGNOSIS — L821 Other seborrheic keratosis: Secondary | ICD-10-CM | POA: Diagnosis not present

## 2018-08-28 DIAGNOSIS — L814 Other melanin hyperpigmentation: Secondary | ICD-10-CM | POA: Diagnosis not present

## 2018-08-28 DIAGNOSIS — L578 Other skin changes due to chronic exposure to nonionizing radiation: Secondary | ICD-10-CM | POA: Diagnosis not present

## 2018-08-28 DIAGNOSIS — S90871A Other superficial bite of right foot, initial encounter: Secondary | ICD-10-CM | POA: Diagnosis not present

## 2018-08-28 DIAGNOSIS — D1801 Hemangioma of skin and subcutaneous tissue: Secondary | ICD-10-CM | POA: Diagnosis not present

## 2018-08-28 DIAGNOSIS — Z85820 Personal history of malignant melanoma of skin: Secondary | ICD-10-CM | POA: Diagnosis not present

## 2018-08-28 DIAGNOSIS — S90872A Other superficial bite of left foot, initial encounter: Secondary | ICD-10-CM | POA: Diagnosis not present

## 2018-08-28 DIAGNOSIS — D229 Melanocytic nevi, unspecified: Secondary | ICD-10-CM | POA: Diagnosis not present

## 2018-08-28 DIAGNOSIS — Z1283 Encounter for screening for malignant neoplasm of skin: Secondary | ICD-10-CM | POA: Diagnosis not present

## 2018-08-28 DIAGNOSIS — L820 Inflamed seborrheic keratosis: Secondary | ICD-10-CM | POA: Diagnosis not present

## 2018-08-28 DIAGNOSIS — D225 Melanocytic nevi of trunk: Secondary | ICD-10-CM | POA: Diagnosis not present

## 2018-09-11 DIAGNOSIS — L259 Unspecified contact dermatitis, unspecified cause: Secondary | ICD-10-CM | POA: Diagnosis not present

## 2018-09-26 ENCOUNTER — Other Ambulatory Visit: Payer: Self-pay | Admitting: Family Medicine

## 2018-09-26 NOTE — Telephone Encounter (Signed)
Electronic refill request. TAC cream 0.1% Last office visit:   06/08/2018 CPE Last Filled:  80 g 0 05/10/2018  Please advise.

## 2018-09-27 NOTE — Telephone Encounter (Signed)
Sent. Thanks.   

## 2018-10-05 DIAGNOSIS — R21 Rash and other nonspecific skin eruption: Secondary | ICD-10-CM | POA: Diagnosis not present

## 2018-10-05 DIAGNOSIS — L309 Dermatitis, unspecified: Secondary | ICD-10-CM | POA: Diagnosis not present

## 2018-10-11 DIAGNOSIS — R21 Rash and other nonspecific skin eruption: Secondary | ICD-10-CM | POA: Diagnosis not present

## 2018-10-11 DIAGNOSIS — L308 Other specified dermatitis: Secondary | ICD-10-CM | POA: Diagnosis not present

## 2018-11-03 ENCOUNTER — Encounter: Payer: Self-pay | Admitting: Emergency Medicine

## 2018-11-03 ENCOUNTER — Emergency Department
Admission: EM | Admit: 2018-11-03 | Discharge: 2018-11-03 | Disposition: A | Discharge status: Home / Self Care | Payer: Medicare Other | Attending: Emergency Medicine | Admitting: Emergency Medicine

## 2018-11-03 ENCOUNTER — Other Ambulatory Visit: Payer: Self-pay

## 2018-11-03 DIAGNOSIS — I10 Essential (primary) hypertension: Secondary | ICD-10-CM | POA: Diagnosis not present

## 2018-11-03 DIAGNOSIS — Z87891 Personal history of nicotine dependence: Secondary | ICD-10-CM | POA: Insufficient documentation

## 2018-11-03 DIAGNOSIS — R339 Retention of urine, unspecified: Secondary | ICD-10-CM | POA: Insufficient documentation

## 2018-11-03 DIAGNOSIS — Z85118 Personal history of other malignant neoplasm of bronchus and lung: Secondary | ICD-10-CM | POA: Insufficient documentation

## 2018-11-03 DIAGNOSIS — Z79899 Other long term (current) drug therapy: Secondary | ICD-10-CM | POA: Insufficient documentation

## 2018-11-03 LAB — COMPREHENSIVE METABOLIC PANEL
ALT: 24 U/L (ref 0–44)
AST: 24 U/L (ref 15–41)
Albumin: 3.4 g/dL — ABNORMAL LOW (ref 3.5–5.0)
Alkaline Phosphatase: 41 U/L (ref 38–126)
Anion gap: 9 (ref 5–15)
BUN: 17 mg/dL (ref 8–23)
CO2: 25 mmol/L (ref 22–32)
Calcium: 8.6 mg/dL — ABNORMAL LOW (ref 8.9–10.3)
Chloride: 95 mmol/L — ABNORMAL LOW (ref 98–111)
Creatinine, Ser: 0.72 mg/dL (ref 0.61–1.24)
GFR calc Af Amer: >60 mL/min (ref >60)
GFR calc non Af Amer: >60 mL/min (ref >60)
Glucose, Bld: 111 mg/dL — ABNORMAL HIGH (ref 70–99)
Potassium: 4.1 mmol/L (ref 3.5–5.1)
Sodium: 129 mmol/L — ABNORMAL LOW (ref 135–145)
Total Bilirubin: 0.5 mg/dL (ref 0.3–1.2)
Total Protein: 6.1 g/dL — ABNORMAL LOW (ref 6.5–8.1)

## 2018-11-03 LAB — URINALYSIS, COMPLETE (UACMP) WITH MICROSCOPIC
Bilirubin Urine: NEGATIVE
Glucose, UA: NEGATIVE mg/dL
Ketones, ur: NEGATIVE mg/dL
Leukocytes,Ua: NEGATIVE
Nitrite: NEGATIVE
Protein, ur: NEGATIVE mg/dL
RBC / HPF: >50 RBC/hpf — ABNORMAL HIGH (ref 0–5)
Specific Gravity, Urine: 1.01 (ref 1.005–1.030)
Squamous Epithelial / LPF: NONE SEEN (ref 0–5)
pH: 7 (ref 5.0–8.0)

## 2018-11-03 LAB — CBC
HCT: 37.7 % — ABNORMAL LOW (ref 39.0–52.0)
Hemoglobin: 13.1 g/dL (ref 13.0–17.0)
MCH: 31.3 pg (ref 26.0–34.0)
MCHC: 34.7 g/dL (ref 30.0–36.0)
MCV: 90.2 fL (ref 80.0–100.0)
Platelets: 194 10*3/uL (ref 150–400)
RBC: 4.18 MIL/uL — ABNORMAL LOW (ref 4.22–5.81)
RDW: 13.8 % (ref 11.5–15.5)
WBC: 5.4 10*3/uL (ref 4.0–10.5)
nRBC: 0 % (ref 0.0–0.2)

## 2018-11-03 NOTE — ED Triage Notes (Signed)
Pt to ED via POV c/o urinary retention. Pt states that he is able to void some but it only "squirts out". Pt states that he recently took dexamethazone for a skin infection. Denies using decongestants. Pt is in NAD at this time.

## 2018-11-03 NOTE — ED Notes (Signed)
Bladder Scan reveals >264mL.

## 2018-11-03 NOTE — ED Notes (Signed)
Pt given urine leg bag and standard foley drainage bag and provided education about how to use and switch each bag. Pt teach back used and supplies provided with pt. Pt states understanding. Denies any questions or concerns. Pt ambulatory to the lobby at this time.

## 2018-11-03 NOTE — ED Notes (Addendum)
MD made aware pt's bladder scan showed > 978 mL in patient's bladder. This RN and Mac, RN at bedside at this time to insert foley catheter.

## 2018-11-03 NOTE — ED Provider Notes (Signed)
[facility] Emergency Department Provider Note   ____________________________________________    I have reviewed the triage vital signs and the nursing notes.   HISTORY  Chief Complaint Urinary Retention     HPI Brett Mcguire is a 73 y.o. male with history of hypertension, hyperlipidemia, enlarged prostate who presents with urinary retention.  Patient reports he is recently completed a course of dexamethasone which he was on for several weeks as well as doxycycline as he was being treated for a skin condition.  He reports the dexamethasone made him urinate quite frequently however last night he began having significant difficulty urinating.  He feels that his bladder is full and is becoming painful.  He has never had urinary retention before.  No fevers or chills nausea or vomiting.  Did have some constipation but large bowel movement last night  Past Medical History:  Diagnosis Date  . Allergy 1989  . Aortic stenosis   . 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 Diverticulosis 02/2000  . Hyperlipidemia 1989  . Hypertension   . Lung cancer ([place]) 1989   s/p lobectomy, no chemo or rady  . Murmur    aortic stenosis    Patient Active Problem List   Diagnosis Date Noted  . Healthcare maintenance 06/11/2018  . Shoulder pain 01/19/2018  . Fecal occult blood test positive   . Benign neoplasm of cecum   . Polyp of sigmoid colon   . Internal hemorrhoids   . Diverticulosis of large intestine without diverticulitis   . Diarrhea 12/11/2017  . Easy bruising 11/10/2017  . Fatigue 08/04/2017  . PSA elevation 06/09/2017  . Medicare annual wellness visit, subsequent 05/31/2017  . Bilateral carotid artery stenosis 05/07/2016  . Shortness of breath 05/07/2016  . Medicare annual wellness visit, initial 02/25/2015  . Advance care planning 02/25/2015  . Bilateral carotid bruits 02/18/2015  . Aortic valve disease 02/18/2015  . Vertigo 01/28/2015   . Olecranon bursitis 05/16/2014  . Rash and nonspecific skin eruption 03/20/2014  . Essential hypertension 01/01/2014  . Seasonal allergies 05/30/2013  . Right leg weakness 04/03/2013  . 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 MELANOMA 04/25/2007  . Hyperlipidemia 04/25/2007  . DIVERTICULOSIS, COLON 04/25/2007  . MICROSCOPIC HEMATURIA 04/25/2007  . Prostatitis 04/25/2007  . HEMATOSPERMIA 04/25/2007  . HYPERGLYCEMIA 04/25/2007    Past Surgical History:  Procedure Laterality Date  . COLONOSCOPY  02/18/2000   Polyp, diverticulosis, repeat in 5 years  . COLONOSCOPY  03/03/2006   Repeat  -  Divertics  . COLONOSCOPY WITH PROPOFOL N/A 12/22/2017   Procedure: COLONOSCOPY WITH PROPOFOL;  Surgeon: Virgel Manifold, MD;  Location: [facility] ENDOSCOPY;  Service: Endoscopy;  Laterality: N/A;  . Laminectomy/Discectomy  11/03/2005   L5/S1  . Malignant melanoma excision  06/20/2003   Dr. Acquanetta Sit  . MRI  10/09/2005   Lumbar spine, bulging disc L5/S1  . Right lobectomy  1989   Lung cancer  . TONSILLECTOMY AND ADENOIDECTOMY      Prior to Admission medications   Medication Sig Start Date End Date Taking? Authorizing Provider  amLODipine (NORVASC) 10 MG tablet TAKE 1 TABLET BY MOUTH DAILY 06/08/18   Tonia Ghent, MD  Probiotic Product (ALIGN) 4 MG CAPS Take by mouth daily.    [provider]  triamcinolone cream (KENALOG) 0.1 % APPLY TO AFFECTED AREAS ON LOWER LEG EVERY DAY AS NEEDED FOR ITCHING 09/27/18   Tonia Ghent, MD  Allergies Alfuzosin, Cialis [tadalafil], Doxazosin, Ephedrine, Finasteride, Flomax [tamsulosin hcl], Rapaflo [silodosin], and Sulfonamide derivatives  Family History  Problem Relation Age of Onset  . Cancer Mother        [place] throat  . Alcohol abuse Mother   . Cancer Father        4 kinds; 5 places, prostate and lung  . Prostate cancer Father        possible prostate cancer  . Cancer Other        Skin, deceased in 04-16-1994  . Colon cancer Neg Hx         none known    Social History Social History   Tobacco Use  . Smoking status: Former Smoker    Packs/day: 2.00    Years: 17.00    Pack years: 34.00    Types: Cigarettes    Quit date: 02/22/1982    Years since quitting: 36.7  . Smokeless tobacco: Never Used  Substance Use Topics  . Alcohol use: Yes    Alcohol/week: 10.0 standard drinks    Types: 10 Glasses of wine per week    Comment: wine  . Drug use: No    Review of Systems  Constitutional: No fever/chills .   Cardiovascular: Denies chest pain. Respiratory: No cough Gastrointestinal: As above Genitourinary: As above, did not have any dysuria Musculoskeletal: Negative for back pain. Skin: Resolving rash Neurological: Negative for weakness   ____________________________________________   PHYSICAL EXAM:  VITAL SIGNS: ED Triage Vitals  Enc Vitals Group     BP 11/03/18 0730 (!) 153/91     Pulse Rate 11/03/18 0730 84     Resp 11/03/18 0730 16     Temp 11/03/18 0730 98.7 F (37.1 C)     Temp Source 11/03/18 0730 Oral     SpO2 11/03/18 0730 96 %     Weight 11/03/18 0727 72.6 kg (160 lb)     Height 11/03/18 0727 1.753 m (5\' 9" )     Head Circumference --      Peak Flow --      Pain Score 11/03/18 0727 4     Pain Loc --      Pain Edu? --      Excl. in [place]? --     Constitutional: Alert and oriented. No acute distress.   Nose: No congestion/rhinnorhea. Mouth/Throat: Mucous membranes are moist.    Cardiovascular: Normal rate,Good peripheral circulation. Respiratory: Normal respiratory effort.  No retractions.  Gastrointestinal: Mild suprapubic distention, no CVA tenderness Genitourinary: deferred Musculoskeletal:.  Warm and well perfused Neurologic:  Normal speech and language. No gross focal neurologic deficits are appreciated.  Skin:  Skin is warm, dry and intact.  Resolving rash to the lower extremities Psychiatric: Mood and affect are normal. Speech and behavior are normal.   ____________________________________________   LABS (all labs ordered are listed, but only abnormal results are displayed)  Labs Reviewed  CBC - Abnormal; Notable for the following components:      Result Value   RBC 4.18 (*)    HCT 37.7 (*)    All other components within normal limits  COMPREHENSIVE METABOLIC PANEL - Abnormal; Notable for the following components:   Sodium 129 (*)    Chloride 95 (*)    Glucose, Bld 111 (*)    Calcium 8.6 (*)    Total Protein 6.1 (*)    Albumin 3.4 (*)    All other components within normal limits  URINALYSIS, COMPLETE (UACMP) WITH MICROSCOPIC - Abnormal; Notable  for the following components:   Color, Urine STRAW (*)    APPearance CLEAR (*)    Hgb urine dipstick LARGE (*)    RBC / HPF >50 (*)    Bacteria, UA RARE (*)    All other components within normal limits  URINE CULTURE   ____________________________________________  EKG   ____________________________________________  RADIOLOGY   ____________________________________________   PROCEDURES  Procedure(s) performed: No  Procedures   Critical Care performed: No ____________________________________________   INITIAL IMPRESSION / ASSESSMENT AND PLAN / ED COURSE  Pertinent labs & imaging results that were available during my care of the patient were reviewed by me and considered in my medical decision making (see chart for details).  Patient presents with urinary retention, likely medication related, stopped dexamethasone 3 days ago, still on Doxy but almost done.  Suprapubic fullness and a strong sense of needing to urinate.  Will check bladder scan anticipate that he will require Foley catheter.  Will check CBC CMP to evaluate kidney function.  Lab work is reassuring, patient has chronic hyponatremia.  Urine culture sent.  He feels much better after Foley insertion, will follow up with his urologist Dr. Eliberto Ivory (he notified me that he had a urologist after discharge instructions  printed)    ____________________________________________   FINAL CLINICAL IMPRESSION(S) / ED DIAGNOSES  Final diagnoses:  Urinary retention        Note:  This document was prepared using Dragon voice recognition software and may include unintentional dictation errors.   Lavonia Drafts, MD 11/03/18 [phone]

## 2018-11-04 LAB — URINE CULTURE: Culture: NO GROWTH

## 2018-11-07 DIAGNOSIS — N401 Enlarged prostate with lower urinary tract symptoms: Secondary | ICD-10-CM | POA: Diagnosis not present

## 2018-11-07 DIAGNOSIS — R972 Elevated prostate specific antigen [PSA]: Secondary | ICD-10-CM | POA: Diagnosis not present

## 2018-11-07 DIAGNOSIS — R338 Other retention of urine: Secondary | ICD-10-CM | POA: Diagnosis not present

## 2018-11-07 DIAGNOSIS — D400 Neoplasm of uncertain behavior of prostate: Secondary | ICD-10-CM | POA: Diagnosis not present

## 2018-11-10 ENCOUNTER — Other Ambulatory Visit: Payer: Self-pay

## 2018-11-10 ENCOUNTER — Encounter: Payer: Self-pay | Admitting: Emergency Medicine

## 2018-11-10 DIAGNOSIS — I861 Scrotal varices: Secondary | ICD-10-CM | POA: Insufficient documentation

## 2018-11-10 DIAGNOSIS — K5900 Constipation, unspecified: Secondary | ICD-10-CM | POA: Diagnosis not present

## 2018-11-10 DIAGNOSIS — Z87891 Personal history of nicotine dependence: Secondary | ICD-10-CM | POA: Diagnosis not present

## 2018-11-10 DIAGNOSIS — I10 Essential (primary) hypertension: Secondary | ICD-10-CM | POA: Insufficient documentation

## 2018-11-10 DIAGNOSIS — R301 Vesical tenesmus: Secondary | ICD-10-CM | POA: Diagnosis not present

## 2018-11-10 DIAGNOSIS — N451 Epididymitis: Secondary | ICD-10-CM | POA: Insufficient documentation

## 2018-11-10 DIAGNOSIS — Z85118 Personal history of other malignant neoplasm of bronchus and lung: Secondary | ICD-10-CM | POA: Insufficient documentation

## 2018-11-10 DIAGNOSIS — R339 Retention of urine, unspecified: Secondary | ICD-10-CM | POA: Diagnosis present

## 2018-11-10 DIAGNOSIS — Z79899 Other long term (current) drug therapy: Secondary | ICD-10-CM | POA: Diagnosis not present

## 2018-11-10 LAB — CBC WITH DIFFERENTIAL/PLATELET
Abs Immature Granulocytes: 0.06 10*3/uL (ref 0.00–0.07)
Basophils Absolute: 0 10*3/uL (ref 0.0–0.1)
Basophils Relative: 0 %
Eosinophils Absolute: 0.1 10*3/uL (ref 0.0–0.5)
Eosinophils Relative: 1 %
HCT: 35.9 % — ABNORMAL LOW (ref 39.0–52.0)
Hemoglobin: 12.3 g/dL — ABNORMAL LOW (ref 13.0–17.0)
Immature Granulocytes: 1 %
Lymphocytes Relative: 8 %
Lymphs Abs: 0.9 10*3/uL (ref 0.7–4.0)
MCH: 31.2 pg (ref 26.0–34.0)
MCHC: 34.3 g/dL (ref 30.0–36.0)
MCV: 91.1 fL (ref 80.0–100.0)
Monocytes Absolute: 0.9 10*3/uL (ref 0.1–1.0)
Monocytes Relative: 8 %
Neutro Abs: 9.3 10*3/uL — ABNORMAL HIGH (ref 1.7–7.7)
Neutrophils Relative %: 82 %
Platelets: 272 10*3/uL (ref 150–400)
RBC: 3.94 MIL/uL — ABNORMAL LOW (ref 4.22–5.81)
RDW: 13.4 % (ref 11.5–15.5)
WBC: 11.3 10*3/uL — ABNORMAL HIGH (ref 4.0–10.5)
nRBC: 0 % (ref 0.0–0.2)

## 2018-11-10 LAB — URINALYSIS, COMPLETE (UACMP) WITH MICROSCOPIC
Specific Gravity, Urine: 1.019 (ref 1.005–1.030)
Squamous Epithelial / HPF: NONE SEEN (ref 0–5)
WBC, UA: >50 WBC/hpf — ABNORMAL HIGH (ref 0–5)

## 2018-11-10 LAB — COMPREHENSIVE METABOLIC PANEL
ALT: 23 U/L (ref 0–44)
AST: 21 U/L (ref 15–41)
Albumin: 3.6 g/dL (ref 3.5–5.0)
Alkaline Phosphatase: 46 U/L (ref 38–126)
Anion gap: 10 (ref 5–15)
BUN: 22 mg/dL (ref 8–23)
CO2: 24 mmol/L (ref 22–32)
Calcium: 9 mg/dL (ref 8.9–10.3)
Chloride: 95 mmol/L — ABNORMAL LOW (ref 98–111)
Creatinine, Ser: 1.21 mg/dL (ref 0.61–1.24)
GFR calc Af Amer: >60 mL/min (ref >60)
GFR calc non Af Amer: 59 mL/min — ABNORMAL LOW (ref >60)
Glucose, Bld: 131 mg/dL — ABNORMAL HIGH (ref 70–99)
Potassium: 4.4 mmol/L (ref 3.5–5.1)
Sodium: 129 mmol/L — ABNORMAL LOW (ref 135–145)
Total Bilirubin: 0.8 mg/dL (ref 0.3–1.2)
Total Protein: 6.9 g/dL (ref 6.5–8.1)

## 2018-11-10 MED ORDER — ACETAMINOPHEN 325 MG PO TABS
650.0000 mg | ORAL_TABLET | Freq: Once | ORAL | Status: AC | PRN
  Administered 2018-11-10: 650 mg via ORAL
  Filled 2018-11-10: qty 2

## 2018-11-10 NOTE — ED Triage Notes (Signed)
Pt arrives with complaints of urinary retention. Pt reports needing a urinary catheter placed last week due to retention. Pt also reports fever at home today.   Bladder scan post urination with finding of 166ml.

## 2018-11-11 ENCOUNTER — Emergency Department
Admission: EM | Admit: 2018-11-11 | Discharge: 2018-11-11 | Disposition: A | Discharge status: Home / Self Care | Payer: Medicare Other | Attending: Emergency Medicine | Admitting: Emergency Medicine

## 2018-11-11 ENCOUNTER — Emergency Department: Payer: Medicare Other

## 2018-11-11 DIAGNOSIS — I861 Scrotal varices: Secondary | ICD-10-CM

## 2018-11-11 DIAGNOSIS — N451 Epididymitis: Secondary | ICD-10-CM | POA: Diagnosis not present

## 2018-11-11 DIAGNOSIS — N3289 Other specified disorders of bladder: Secondary | ICD-10-CM

## 2018-11-11 DIAGNOSIS — N50812 Left testicular pain: Secondary | ICD-10-CM

## 2018-11-11 DIAGNOSIS — K5900 Constipation, unspecified: Secondary | ICD-10-CM

## 2018-11-11 LAB — LACTIC ACID, PLASMA: Lactic Acid, Venous: 0.7 mmol/L (ref 0.5–1.9)

## 2018-11-11 MED ORDER — OXYBUTYNIN CHLORIDE 5 MG PO TABS: 5.0000 mg | ORAL_TABLET | Freq: Three times a day (TID) | ORAL | 0 refills | Status: DC | PRN

## 2018-11-11 MED ORDER — OXYBUTYNIN CHLORIDE 5 MG PO TABS
5.0000 mg | ORAL_TABLET | Freq: Once | ORAL | Status: AC
  Administered 2018-11-11: 5 mg via ORAL
  Filled 2018-11-11: qty 1

## 2018-11-11 MED ORDER — CIPROFLOXACIN HCL 500 MG PO TABS: 500.0000 mg | ORAL_TABLET | Freq: Two times a day (BID) | ORAL | 0 refills | Status: DC

## 2018-11-11 MED ORDER — CIPROFLOXACIN HCL 500 MG PO TABS
500.0000 mg | ORAL_TABLET | Freq: Once | ORAL | Status: AC
  Administered 2018-11-11: 04:00:00 500 mg via ORAL
  Filled 2018-11-11: qty 1

## 2018-11-11 MED ORDER — LACTULOSE 10 GM/15ML PO SOLN: 20.0000 g | Freq: Every day | ORAL | 0 refills | Status: DC | PRN

## 2018-11-11 NOTE — ED Provider Notes (Signed)
[facility] Emergency Department Provider Note   ____________________________________________   First MD Initiated Contact with Patient 11/11/18 [phone]     (approximate)  I have reviewed the triage vital signs and the nursing notes.   HISTORY  Chief Complaint Urinary Retention    HPI RAIDER VALBUENA is a 73 y.o. male who presents to the ED from home with a chief complaint of urinary retention.  Patient was seen on 10/23 in the ED for urinary retention presumably stemming from an enlarged prostate.  Foley was placed which was removed 2 days ago.  Patient thinks urinary retention for started because he took a course of dexamethasone as well as doxycycline for a skin condition.  Has been urinating okay since Wednesday but dribbling today.  Has also been having some issues with constipation.  Had a fever of 101.5 degrees this evening.  Did not take antipyretics prior to arrival.  Denies cough, chest pain, shortness of breath, nausea, vomiting.  Placed on Uribel 2 days ago by his urologist.       Past Medical History:  Diagnosis Date  . Allergy 1989  . Aortic stenosis   . 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 Diverticulosis 02/2000  . Hyperlipidemia 1989  . Hypertension   . Lung cancer ([place]) 1989   s/p lobectomy, no chemo or rady  . Murmur    aortic stenosis    Patient Active Problem List   Diagnosis Date Noted  . Healthcare maintenance 06/11/2018  . Shoulder pain 01/19/2018  . Fecal occult blood test positive   . Benign neoplasm of cecum   . Polyp of sigmoid colon   . Internal hemorrhoids   . Diverticulosis of large intestine without diverticulitis   . Diarrhea 12/11/2017  . Easy bruising 11/10/2017  . Fatigue 08/04/2017  . PSA elevation 06/09/2017  . Medicare annual wellness visit, subsequent 05/31/2017  . Bilateral carotid artery stenosis 05/07/2016  . Shortness of breath 05/07/2016  . Medicare annual wellness visit, initial  02/25/2015  . Advance care planning 02/25/2015  . Bilateral carotid bruits 02/18/2015  . Aortic valve disease 02/18/2015  . Vertigo 01/28/2015  . Olecranon bursitis 05/16/2014  . Rash and nonspecific skin eruption 03/20/2014  . Essential hypertension 01/01/2014  . Seasonal allergies 05/30/2013  . Right leg weakness 04/03/2013  . 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 MELANOMA 04/25/2007  . Hyperlipidemia 04/25/2007  . DIVERTICULOSIS, COLON 04/25/2007  . MICROSCOPIC HEMATURIA 04/25/2007  . Prostatitis 04/25/2007  . HEMATOSPERMIA 04/25/2007  . HYPERGLYCEMIA 04/25/2007    Past Surgical History:  Procedure Laterality Date  . COLONOSCOPY  02/18/2000   Polyp, diverticulosis, repeat in 5 years  . COLONOSCOPY  03/03/2006   Repeat  -  Divertics  . COLONOSCOPY WITH PROPOFOL N/A 12/22/2017   Procedure: COLONOSCOPY WITH PROPOFOL;  Surgeon: Virgel Manifold, MD;  Location: [facility] ENDOSCOPY;  Service: Endoscopy;  Laterality: N/A;  . Laminectomy/Discectomy  11/03/2005   L5/S1  . Malignant melanoma excision  06/20/2003   Dr. Acquanetta Sit  . MRI  10/09/2005   Lumbar spine, bulging disc L5/S1  . Right lobectomy  1989   Lung cancer  . TONSILLECTOMY AND ADENOIDECTOMY      Prior to Admission medications   Medication Sig Start Date End Date Taking? Authorizing Provider  amLODipine (NORVASC) 10 MG tablet TAKE 1 TABLET BY MOUTH DAILY Patient taking differently: Take 10 mg by mouth daily.  06/08/18   Tonia Ghent, MD  ciprofloxacin (CIPRO) 500 MG tablet Take 1 tablet (500 mg total) by mouth 2 (two) times daily. 11/11/18   Paulette Blanch, MD  lactulose (CHRONULAC) 10 GM/15ML solution Take 30 mLs (20 g total) by mouth daily as needed for mild constipation. 11/11/18   Paulette Blanch, MD  Meth-Hyo-M Barnett Hatter Phos-Ph Sal (URO-MP) 118 MG CAPS Take 1 capsule by mouth every 6 (six) hours as needed (dysuria).  11/09/18   [provider]  oxybutynin (DITROPAN) 5 MG tablet Take 1 tablet (5 mg total)  by mouth every 8 (eight) hours as needed for bladder spasms. 11/11/18   Paulette Blanch, MD  Probiotic Product (ALIGN) 4 MG CAPS Take by mouth daily.    [provider]  triamcinolone cream (KENALOG) 0.1 % APPLY TO AFFECTED AREAS ON LOWER LEG EVERY DAY AS NEEDED FOR ITCHING Patient taking differently: Apply 1 application topically daily as needed (itching). APPLY TO AFFECTED AREAS ON LOWER LEG EVERY DAY AS NEEDED FOR ITCHING 09/27/18   Tonia Ghent, MD    Allergies Alfuzosin, Cialis [tadalafil], Doxazosin, Ephedrine, Finasteride, Flomax [tamsulosin hcl], Rapaflo [silodosin], and Sulfonamide derivatives  Family History  Problem Relation Age of Onset  . Cancer Mother        [place] throat  . Alcohol abuse Mother   . Cancer Father        4 kinds; 5 places, prostate and lung  . Prostate cancer Father        possible prostate cancer  . Cancer Other        Skin, deceased in 04/03/94  . Colon cancer Neg Hx        none known    Social History Social History   Tobacco Use  . Smoking status: Former Smoker    Packs/day: 2.00    Years: 17.00    Pack years: 34.00    Types: Cigarettes    Quit date: 02/22/1982    Years since quitting: 36.7  . Smokeless tobacco: Never Used  Substance Use Topics  . Alcohol use: Yes    Alcohol/week: 10.0 standard drinks    Types: 10 Glasses of wine per week    Comment: wine  . Drug use: No    Review of Systems  Constitutional: No fever/chills Eyes: No visual changes. ENT: No sore throat. Cardiovascular: Denies chest pain. Respiratory: Denies shortness of breath. Gastrointestinal: Positive for suprapubic abdominal pain.  No nausea, no vomiting.  No diarrhea.  No constipation. Genitourinary: Positive for urinary retention.  Positive for left testicle pain which developed since arriving to the ED.  Negative for dysuria. Musculoskeletal: Negative for back pain. Skin: Negative for rash. Neurological: Negative for headaches, focal weakness or numbness.    ____________________________________________   PHYSICAL EXAM:  VITAL SIGNS: ED Triage Vitals  Enc Vitals Group     BP 11/10/18 2150-04-03 (!) 148/82     Pulse Rate 11/10/18 04/03/50 94     Resp 11/10/18 04/03/2150 18     Temp 11/10/18 04-03-50 100.1 F (37.8 C)     Temp Source 11/10/18 04/03/2150 Oral     SpO2 11/10/18 2152 97 %     Weight 11/10/18 2153 160 lb (72.6 kg)     Height 11/10/18 03-Apr-2151 5\' 9"  (1.753 m)     Head Circumference --      Peak Flow --      Pain Score 11/10/18 04/03/51 8     Pain Loc --      Pain Edu? --  Excl. in [place]? --     Constitutional: Alert and oriented. Well appearing and in mild acute distress. Eyes: Conjunctivae are normal. PERRL. EOMI. Head: Atraumatic. Nose: No congestion/rhinnorhea. Mouth/Throat: Mucous membranes are moist.  Oropharynx non-erythematous. Neck: No stridor.   Cardiovascular: Normal rate, regular rhythm. Grossly normal heart sounds.  Good peripheral circulation. Respiratory: Normal respiratory effort.  No retractions. Lungs CTAB. Gastrointestinal: Soft and mildly tender to palpation suprapubic area without rebound or guarding. No distention. No abdominal bruits. No CVA tenderness. Genitourinary: Circumcised male.  Left testicle mildly swollen and tender to palpation.  No horizontal lie.  Strong bilateral cremasteric reflexes. Musculoskeletal: No lower extremity tenderness nor edema.  No joint effusions. Neurologic:  Normal speech and language. No gross focal neurologic deficits are appreciated. No gait instability. Skin:  Skin is warm, dry and intact. No rash noted. Psychiatric: Mood and affect are normal. Speech and behavior are normal.  ____________________________________________   LABS (all labs ordered are listed, but only abnormal results are displayed)  Labs Reviewed  CBC WITH DIFFERENTIAL/PLATELET - Abnormal; Notable for the following components:      Result Value   WBC 11.3 (*)    RBC 3.94 (*)    Hemoglobin 12.3 (*)    HCT 35.9 (*)     Neutro Abs 9.3 (*)    All other components within normal limits  COMPREHENSIVE METABOLIC PANEL - Abnormal; Notable for the following components:   Sodium 129 (*)    Chloride 95 (*)    Glucose, Bld 131 (*)    GFR calc non Af Amer 59 (*)    All other components within normal limits  URINALYSIS, COMPLETE (UACMP) WITH MICROSCOPIC - Abnormal; Notable for the following components:   Color, Urine GREEN (*)    APPearance CLOUDY (*)    Glucose, UA   (*)    Value: TEST NOT REPORTED DUE TO COLOR INTERFERENCE OF URINE PIGMENT   Hgb urine dipstick   (*)    Value: TEST NOT REPORTED DUE TO COLOR INTERFERENCE OF URINE PIGMENT   Bilirubin Urine   (*)    Value: TEST NOT REPORTED DUE TO COLOR INTERFERENCE OF URINE PIGMENT   Ketones, ur   (*)    Value: TEST NOT REPORTED DUE TO COLOR INTERFERENCE OF URINE PIGMENT   Protein, ur   (*)    Value: TEST NOT REPORTED DUE TO COLOR INTERFERENCE OF URINE PIGMENT   Nitrite   (*)    Value: TEST NOT REPORTED DUE TO COLOR INTERFERENCE OF URINE PIGMENT   Leukocytes,Ua   (*)    Value: TEST NOT REPORTED DUE TO COLOR INTERFERENCE OF URINE PIGMENT   WBC, UA >50 (*)    Bacteria, UA MANY (*)    All other components within normal limits  CULTURE, BLOOD (ROUTINE X 2)  CULTURE, BLOOD (ROUTINE X 2)  URINE CULTURE  URINE CULTURE  LACTIC ACID, PLASMA   ____________________________________________  EKG  None ____________________________________________  RADIOLOGY  ED MD interpretation: Large stool burden and right hemicolon; left varicocele, left epididymitis; no torsion  Official radiology report(s): Dg Abdomen 1 View  Result Date: 11/11/2018 CLINICAL DATA:  Constipation EXAM: ABDOMEN - 1 VIEW COMPARISON:  None. FINDINGS: There is a nonobstructive bowel gas pattern. There is a large amount of stool in the right hemicolon. Phleboliths project over the patient's pelvis. There is nonspecific elevation of the right hemidiaphragm. IMPRESSION: Large amount of stool in  the right hemicolon. Electronically Signed   By: Jamie Kato.D.  On: 11/11/2018 01:03   US Scrotum W/doppler  Result Date: 11/11/2018 CLINICAL DATA:  Left testicular pain EXAM: SCROTAL ULTRASOUND DOPPLER ULTRASOUND OF THE TESTICLES TECHNIQUE: Complete ultrasound examination of the testicles, epididymis, and other scrotal structures was performed. Color and spectral Doppler ultrasound were also utilized to evaluate blood flow to the testicles. COMPARISON:  None. FINDINGS: Right testicle Measurements: 2.9 x 1.9 x 2.2 cm. There is scattered microcalcifications and at least 2 dominant coarse calcifications within the testicular parenchyma. There is no worrisome focal mass. Left testicle Measurements: 4.3 x 2.6 x 2.8 cm. There is tubular ectasia of the rete testis. There are few small intraparenchymal cysts. Microcalcifications are noted. Right epididymis:  A 3.1 mm epididymal head cyst is noted. Left epididymis: The left-sided epididymis is hypervascular and enlarged in comparison to the right. Hydrocele:  There is small bilateral hydroceles. Varicocele:  There is a left-sided varicocele. Pulsed Doppler interrogation of both testes demonstrates normal low resistance arterial and venous waveforms bilaterally. IMPRESSION: 1. No evidence for testicular torsion. 2. Hypervascular and enlarge left epididymis is suspicious for left-sided epididymitis in the appropriate clinical setting. 3. There is a left-sided varicocele. 4. 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 Electronically Signed   By: Constance Holster M.D.   On: 11/11/2018 02:45    ____________________________________________   PROCEDURES  Procedure(s) performed (including Critical Care):  Procedures   ____________________________________________   INITIAL IMPRESSION / ASSESSMENT AND PLAN / ED COURSE  As part of my medical decision making, I reviewed the following data within the [place] notes reviewed and  incorporated, Labs reviewed, Old chart reviewed, Radiograph reviewed and Notes from prior ED visits     KARELL TUKES was evaluated in Emergency Department on 11/11/2018 for the symptoms described in the history of present illness. He was evaluated in the context of the global COVID-19 pandemic, which necessitated consideration that the patient might be at risk for infection with the SARS-CoV-2 virus that causes COVID-19. Institutional protocols and algorithms that pertain to the evaluation of patients at risk for COVID-19 are in a state of rapid change based on information released by regulatory bodies including the CDC and federal and state organizations. These policies and algorithms were followed during the patient's care in the ED.    73 year old male who presents with urinary retention.  History of same last week status post Foley catheter x5 days.  Differential diagnosis includes but is not limited to retention, constipation causing retention, UTI, bladder spasms, etc.  Bladder scan postvoid residual 128 mL.  Will obtain KUB, ultrasound scrotum.  Patient without history of kidney stones.  WBC 11.  Given history of fever at home, will obtain blood cultures and lactic acid.  Mild hyponatremia is stable from prior.  Uribel color interference of urinalysis.   Clinical Course as of Nov 11 442  Fri Nov 10, 2018  2355 Bacteria, UA(!): MANY [RB]  2356 RBC / HPF: 6-10 [RB]  Sat Nov 11, 2018  0325 Updated patient on all test results.  Will discharge home with Cipro for epididymitis, Ditropan for bladder spasms.  Patient will follow up closely with his urologist.  Lactulose for constipation.  Strict return precautions given.  Patient verbalizes understanding and agrees with plan of care.   [JS]    Clinical Course User Index [JS] Paulette Blanch, MD [RB] Gregor Hams, MD     ____________________________________________   FINAL CLINICAL IMPRESSION(S) / ED DIAGNOSES  Final diagnoses:   Epididymitis  Bladder spasms  Varicocele  Constipation, unspecified constipation type     ED Discharge Orders         Ordered    ciprofloxacin (CIPRO) 500 MG tablet  2 times daily     11/11/18 0318    oxybutynin (DITROPAN) 5 MG tablet  Every 8 hours PRN     11/11/18 0318    lactulose (CHRONULAC) 10 GM/15ML solution  Daily PRN     11/11/18 0318           Note:  This document was prepared using Dragon voice recognition software and may include unintentional dictation errors.   Paulette Blanch, MD 11/11/18 [phone]

## 2018-11-11 NOTE — Discharge Instructions (Signed)
Take antibiotic as prescribed (Cipro 500mg  twice daily x 7 days). Take Lactulose as needed for bowel movement.  Take Ditropan every 8 hours as needed for bladder spasms. Return to the ER for worsening symptoms, persistent vomiting, fever, difficulty breathing or other concerns.

## 2018-11-11 NOTE — ED Notes (Addendum)
Pt reports pressure pain in lower abdomen 5/10, pt c/o retention also pain in left testicle that just suddenly started 7/10   pt reports that tylenol that was given earlier made him "pee a little"

## 2018-11-13 DIAGNOSIS — L308 Other specified dermatitis: Secondary | ICD-10-CM | POA: Diagnosis not present

## 2018-11-13 DIAGNOSIS — I861 Scrotal varices: Secondary | ICD-10-CM | POA: Diagnosis not present

## 2018-11-13 DIAGNOSIS — L853 Xerosis cutis: Secondary | ICD-10-CM | POA: Diagnosis not present

## 2018-11-13 LAB — URINE CULTURE: Culture: >=100000 — AB

## 2018-11-14 DIAGNOSIS — R972 Elevated prostate specific antigen [PSA]: Secondary | ICD-10-CM | POA: Diagnosis not present

## 2018-11-14 DIAGNOSIS — N451 Epididymitis: Secondary | ICD-10-CM | POA: Diagnosis not present

## 2018-11-14 DIAGNOSIS — R339 Retention of urine, unspecified: Secondary | ICD-10-CM | POA: Diagnosis not present

## 2018-11-14 DIAGNOSIS — N401 Enlarged prostate with lower urinary tract symptoms: Secondary | ICD-10-CM | POA: Diagnosis not present

## 2018-11-14 NOTE — Progress Notes (Signed)
Brief Pharmacy Note  Patient is a 73 y/o M who presented to [facility] ED 10/31 c/o urinary rentention, fever, lower abdomen pain, and left testicular pain. Recent ED visit 10/23 for urinary retention thought to be related to enlarged prostate and had foley placed x 5 days. Recently started on Uribel by urologist. Patient was discharged on ciprofloxacin for epididymitis. Blood cultures from ED visit pending but with no growth x 3 days so far. Urine culture from ED visit has resulted >100k colonies/mL Enterobacter asburiae (cipro sensitive). Given discharge antibiotic is active against identified organism will not pursue further intervention at this time.   [place] Resident 14 November 2018

## 2018-11-15 ENCOUNTER — Encounter: Payer: Self-pay | Admitting: Emergency Medicine

## 2018-11-15 ENCOUNTER — Other Ambulatory Visit: Payer: Self-pay

## 2018-11-15 DIAGNOSIS — R002 Palpitations: Secondary | ICD-10-CM | POA: Diagnosis not present

## 2018-11-15 DIAGNOSIS — Z79899 Other long term (current) drug therapy: Secondary | ICD-10-CM | POA: Diagnosis not present

## 2018-11-15 DIAGNOSIS — F17210 Nicotine dependence, cigarettes, uncomplicated: Secondary | ICD-10-CM | POA: Diagnosis not present

## 2018-11-15 DIAGNOSIS — E871 Hypo-osmolality and hyponatremia: Secondary | ICD-10-CM | POA: Diagnosis not present

## 2018-11-15 DIAGNOSIS — Z85118 Personal history of other malignant neoplasm of bronchus and lung: Secondary | ICD-10-CM | POA: Insufficient documentation

## 2018-11-15 DIAGNOSIS — T448X5A Adverse effect of centrally-acting and adrenergic-neuron-blocking agents, initial encounter: Secondary | ICD-10-CM | POA: Insufficient documentation

## 2018-11-15 DIAGNOSIS — T887XXA Unspecified adverse effect of drug or medicament, initial encounter: Secondary | ICD-10-CM | POA: Diagnosis not present

## 2018-11-15 DIAGNOSIS — I10 Essential (primary) hypertension: Secondary | ICD-10-CM | POA: Insufficient documentation

## 2018-11-15 LAB — CBC WITH DIFFERENTIAL/PLATELET
Abs Immature Granulocytes: 0.14 10*3/uL — ABNORMAL HIGH (ref 0.00–0.07)
Basophils Absolute: 0 10*3/uL (ref 0.0–0.1)
Basophils Relative: 0 %
Eosinophils Absolute: 0.1 10*3/uL (ref 0.0–0.5)
Eosinophils Relative: 2 %
HCT: 32.2 % — ABNORMAL LOW (ref 39.0–52.0)
Hemoglobin: 11.2 g/dL — ABNORMAL LOW (ref 13.0–17.0)
Immature Granulocytes: 2 %
Lymphocytes Relative: 17 %
Lymphs Abs: 1 10*3/uL (ref 0.7–4.0)
MCH: 30.9 pg (ref 26.0–34.0)
MCHC: 34.8 g/dL (ref 30.0–36.0)
MCV: 88.7 fL (ref 80.0–100.0)
Monocytes Absolute: 0.7 10*3/uL (ref 0.1–1.0)
Monocytes Relative: 12 %
Neutro Abs: 3.8 10*3/uL (ref 1.7–7.7)
Neutrophils Relative %: 67 %
Platelets: 424 10*3/uL — ABNORMAL HIGH (ref 150–400)
RBC: 3.63 MIL/uL — ABNORMAL LOW (ref 4.22–5.81)
RDW: 12.9 % (ref 11.5–15.5)
WBC: 5.8 10*3/uL (ref 4.0–10.5)
nRBC: 0 % (ref 0.0–0.2)

## 2018-11-15 LAB — COMPREHENSIVE METABOLIC PANEL
ALT: 66 U/L — ABNORMAL HIGH (ref 0–44)
AST: 61 U/L — ABNORMAL HIGH (ref 15–41)
Albumin: 3.4 g/dL — ABNORMAL LOW (ref 3.5–5.0)
Alkaline Phosphatase: 48 U/L (ref 38–126)
Anion gap: 11 (ref 5–15)
BUN: 11 mg/dL (ref 8–23)
CO2: 23 mmol/L (ref 22–32)
Calcium: 9.2 mg/dL (ref 8.9–10.3)
Chloride: 89 mmol/L — ABNORMAL LOW (ref 98–111)
Creatinine, Ser: 0.94 mg/dL (ref 0.61–1.24)
GFR calc Af Amer: >60 mL/min (ref >60)
GFR calc non Af Amer: >60 mL/min (ref >60)
Glucose, Bld: 170 mg/dL — ABNORMAL HIGH (ref 70–99)
Potassium: 3.5 mmol/L (ref 3.5–5.1)
Sodium: 123 mmol/L — ABNORMAL LOW (ref 135–145)
Total Bilirubin: 0.8 mg/dL (ref 0.3–1.2)
Total Protein: 6.6 g/dL (ref 6.5–8.1)

## 2018-11-15 LAB — TROPONIN I (HIGH SENSITIVITY): Troponin I (High Sensitivity): 12 ng/L (ref <18)

## 2018-11-15 NOTE — ED Triage Notes (Signed)
Pt to triage via w/c with no distress noted, mask in place; pt st since 9am has had sensation of heart racing after taking alfuzosin; denies any accomp symptoms

## 2018-11-16 ENCOUNTER — Emergency Department
Admission: EM | Admit: 2018-11-16 | Discharge: 2018-11-16 | Disposition: A | Discharge status: Home / Self Care | Payer: Medicare Other | Attending: Emergency Medicine | Admitting: Emergency Medicine

## 2018-11-16 ENCOUNTER — Emergency Department: Payer: Medicare Other

## 2018-11-16 DIAGNOSIS — T50905A Adverse effect of unspecified drugs, medicaments and biological substances, initial encounter: Secondary | ICD-10-CM

## 2018-11-16 DIAGNOSIS — R338 Other retention of urine: Secondary | ICD-10-CM | POA: Diagnosis not present

## 2018-11-16 DIAGNOSIS — R002 Palpitations: Secondary | ICD-10-CM

## 2018-11-16 DIAGNOSIS — N401 Enlarged prostate with lower urinary tract symptoms: Secondary | ICD-10-CM | POA: Diagnosis not present

## 2018-11-16 DIAGNOSIS — E871 Hypo-osmolality and hyponatremia: Secondary | ICD-10-CM

## 2018-11-16 LAB — CULTURE, BLOOD (ROUTINE X 2)
Culture: NO GROWTH
Culture: NO GROWTH
Special Requests: ADEQUATE
Special Requests: ADEQUATE

## 2018-11-16 LAB — TROPONIN I (HIGH SENSITIVITY): Troponin I (High Sensitivity): 10 ng/L (ref <18)

## 2018-11-16 MED ORDER — SODIUM CHLORIDE 0.9 % IV BOLUS
1000.0000 mL | Freq: Once | INTRAVENOUS | Status: AC
  Administered 2018-11-16: 1000 mL via INTRAVENOUS

## 2018-11-16 NOTE — ED Notes (Signed)
E-signature not working at this time. Pt verbalized understanding of D/C instructions, prescriptions and follow up care with no further questions at this time. Pt in NAD and ambulatory at time of D/C.  

## 2018-11-16 NOTE — Discharge Instructions (Signed)
1.  Discontinue Afluzosin.  Speak with your urologist about an alternate medication. 2.  Follow-up with your doctor for repeat sodium level next week. 3.  Return to the ER for worsening symptoms, persistent vomiting, difficulty breathing or other concerns.

## 2018-11-16 NOTE — ED Provider Notes (Signed)
[facility] Emergency Department Provider Note   ____________________________________________   First MD Initiated Contact with Patient 11/16/18 0131     (approximate)  I have reviewed the triage vital signs and the nursing notes.   HISTORY  Chief Complaint Palpitations    HPI Brett Mcguire is a 73 y.o. male who presents to the ED from home with adverse medication reaction.  Patient has had ongoing issues with urinary retention.  Has taken alfuzosin previously with feelings of palpitations and dizziness.  They agreed for patient to retry the medicine.  He took his first dose at 9 AM and subsequently began to have a sensation of his heart racing, worse on lying flat associated with dizziness.  Denies fever, cough, chest pain, shortness of breath, abdominal pain, nausea or vomiting.  Denies recent travel or trauma.       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   Diverticulosis 02/2000   Hyperlipidemia 1989   Hypertension    Lung cancer ([place]) 1989   s/p lobectomy, no chemo or rady   Murmur    aortic stenosis    Patient Active Problem List   Diagnosis Date Noted   Healthcare maintenance 06/11/2018   Shoulder pain 01/19/2018   Fecal occult blood test positive    Benign neoplasm of cecum    Polyp of sigmoid colon    Internal hemorrhoids    Diverticulosis of large intestine without diverticulitis    Diarrhea 12/11/2017   Easy bruising 11/10/2017   Fatigue 08/04/2017   PSA elevation 06/09/2017   Medicare annual wellness visit, subsequent 05/31/2017   Bilateral carotid artery stenosis 05/07/2016   Shortness of breath 05/07/2016   Medicare annual wellness visit, initial 02/25/2015   Advance care planning 02/25/2015   Bilateral carotid bruits 02/18/2015   Aortic valve disease 02/18/2015   Vertigo 01/28/2015   Olecranon bursitis  05/16/2014   Rash and nonspecific skin eruption 03/20/2014   Essential hypertension 01/01/2014   Seasonal allergies 05/30/2013   Right leg weakness 04/03/2013   ADENOCARCINOMA, LUNG, RIGHT 04/25/2007   MELANOMA 04/25/2007   Hyperlipidemia 04/25/2007   DIVERTICULOSIS, COLON 04/25/2007   MICROSCOPIC HEMATURIA 04/25/2007   Prostatitis 04/25/2007   HEMATOSPERMIA 04/25/2007   HYPERGLYCEMIA 04/25/2007    Past Surgical History:  Procedure Laterality Date   COLONOSCOPY  02/18/2000   Polyp, diverticulosis, repeat in 5 years   COLONOSCOPY  03/03/2006   Repeat  -  Divertics   COLONOSCOPY WITH PROPOFOL N/A 12/22/2017   Procedure: COLONOSCOPY WITH PROPOFOL;  Surgeon: Virgel Manifold, MD;  Location: [facility] ENDOSCOPY;  Service: Endoscopy;  Laterality: N/A;   Laminectomy/Discectomy  11/03/2005   L5/S1   Malignant melanoma excision  06/20/2003   Dr. Acquanetta Sit   MRI  10/09/2005   Lumbar spine, bulging disc L5/S1   Right lobectomy  1989   Lung cancer   TONSILLECTOMY AND ADENOIDECTOMY      Prior to Admission medications   Medication Sig Start Date End Date Taking? Authorizing Provider  amLODipine (NORVASC) 10 MG tablet TAKE 1 TABLET BY MOUTH DAILY Patient taking differently: Take 10 mg by mouth daily.  06/08/18   Tonia Ghent, MD  ciprofloxacin (CIPRO) 500 MG tablet Take 1 tablet (500 mg total) by mouth 2 (two) times daily. 11/11/18   Paulette Blanch, MD  lactulose (CHRONULAC) 10 GM/15ML solution Take 30 mLs (20  g total) by mouth daily as needed for mild constipation. 11/11/18   Paulette Blanch, MD  Meth-Hyo-M Barnett Hatter Phos-Ph Sal (URO-MP) 118 MG CAPS Take 1 capsule by mouth every 6 (six) hours as needed (dysuria).  11/09/18   [provider]  oxybutynin (DITROPAN) 5 MG tablet Take 1 tablet (5 mg total) by mouth every 8 (eight) hours as needed for bladder spasms. 11/11/18   Paulette Blanch, MD  Probiotic Product (ALIGN) 4 MG CAPS Take by mouth daily.    [provider]  triamcinolone cream (KENALOG) 0.1 % APPLY TO AFFECTED AREAS ON LOWER LEG EVERY DAY AS NEEDED FOR ITCHING Patient taking differently: Apply 1 application topically daily as needed (itching). APPLY TO AFFECTED AREAS ON LOWER LEG EVERY DAY AS NEEDED FOR ITCHING 09/27/18   Tonia Ghent, MD    Allergies Alfuzosin, Cialis [tadalafil], Doxazosin, Ephedrine, Finasteride, Flomax [tamsulosin hcl], Rapaflo [silodosin], and Sulfonamide derivatives  Family History  Problem Relation Age of Onset   Cancer Mother        Brea throat   Alcohol abuse Mother    Cancer Father        4 kinds; 5 places, prostate and lung   Prostate cancer Father        possible prostate cancer   Cancer Other        Skin, deceased in 1994/03/24   Colon cancer Neg Hx        none known    Social History Social History   Tobacco Use   Smoking status: Former Smoker    Packs/day: 2.00    Years: 17.00    Pack years: 34.00    Types: Cigarettes    Quit date: 02/22/1982    Years since quitting: 36.7   Smokeless tobacco: Never Used  Substance Use Topics   Alcohol use: Yes    Alcohol/week: 10.0 standard drinks    Types: 10 Glasses of wine per week    Comment: wine   Drug use: No    Review of Systems  Constitutional: No fever/chills Eyes: No visual changes. ENT: No sore throat. Cardiovascular: Positive for palpitations.  Denies chest pain. Respiratory: Denies shortness of breath. Gastrointestinal: No abdominal pain.  No nausea, no vomiting.  No diarrhea.  No constipation. Genitourinary: Negative for dysuria. Musculoskeletal: Negative for back pain. Skin: Negative for rash. Neurological: Positive for dizziness.  Negative for headaches, focal weakness or numbness.   ____________________________________________   PHYSICAL EXAM:  VITAL SIGNS: ED Triage Vitals  Enc Vitals Group     BP 11/15/18 2216 116/71     Pulse Rate 11/15/18 2216 99     Resp 11/15/18 2216 18     Temp 11/15/18 2216  98.7 F (37.1 C)     Temp Source 11/15/18 2216 Oral     SpO2 11/15/18 2216 97 %     Weight 11/15/18 2217 160 lb (72.6 kg)     Height 11/15/18 03/24/2215 5\' 9"  (1.753 m)     Head Circumference --      Peak Flow --      Pain Score 11/15/18 Mar 24, 2215 0     Pain Loc --      Pain Edu? --      Excl. in [place]? --     Constitutional: Alert and oriented. Well appearing and in no acute distress. Eyes: Conjunctivae are normal. PERRL. EOMI. Head: Atraumatic. Nose: No congestion/rhinnorhea. Mouth/Throat: Mucous membranes are moist.  Oropharynx non-erythematous. Neck: No stridor.   Cardiovascular: Normal  rate, regular rhythm. Grossly normal heart sounds.  Good peripheral circulation. Respiratory: Normal respiratory effort.  No retractions. Lungs CTAB. Gastrointestinal: Soft and nontender. No distention. No abdominal bruits. No CVA tenderness. Musculoskeletal: No lower extremity tenderness nor edema.  No joint effusions. Neurologic:  Normal speech and language. No gross focal neurologic deficits are appreciated. No gait instability. Skin:  Skin is warm, dry and intact. No rash noted. Psychiatric: Mood and affect are normal. Speech and behavior are normal.  ____________________________________________   LABS (all labs ordered are listed, but only abnormal results are displayed)  Labs Reviewed  CBC WITH DIFFERENTIAL/PLATELET - Abnormal; Notable for the following components:      Result Value   RBC 3.63 (*)    Hemoglobin 11.2 (*)    HCT 32.2 (*)    Platelets 424 (*)    Abs Immature Granulocytes 0.14 (*)    All other components within normal limits  COMPREHENSIVE METABOLIC PANEL - Abnormal; Notable for the following components:   Sodium 123 (*)    Chloride 89 (*)    Glucose, Bld 170 (*)    Albumin 3.4 (*)    AST 61 (*)    ALT 66 (*)    All other components within normal limits  TROPONIN I (HIGH SENSITIVITY)  TROPONIN I (HIGH SENSITIVITY)   ____________________________________________  EKG  ED  ECG REPORT I, Jalaila Caradonna J, the attending physician, personally viewed and interpreted this ECG.   Date: 11/16/2018  EKG Time: 2218  Rate: 100  Rhythm: normal EKG, normal sinus rhythm  Axis: Normal  Intervals:none  ST&T Change: Nonspecific  ____________________________________________  RADIOLOGY  ED MD interpretation: No acute cardiopulmonary process  Official radiology report(s): Dg Chest Port 1 View  Result Date: 11/16/2018 CLINICAL DATA:  Palpitations EXAM: PORTABLE CHEST 1 VIEW COMPARISON:  December 13, 2013 FINDINGS: There are postsurgical changes throughout the right hemithorax with volume loss and elevation of the right hemidiaphragm. Chronic lung changes are noted bilaterally. IMPRESSION: No active disease. Electronically Signed   By: Constance Holster M.D.   On: 11/16/2018 01:50    ____________________________________________   PROCEDURES  Procedure(s) performed (including Critical Care):  Procedures   ____________________________________________   INITIAL IMPRESSION / ASSESSMENT AND PLAN / ED COURSE  As part of my medical decision making, I reviewed the following data within the [facility] notes reviewed and incorporated, Labs reviewed, EKG interpreted, Old chart reviewed, Radiograph reviewed and Notes from prior ED visits     Brett Mcguire was evaluated in Emergency Department on 11/16/2018 for the symptoms described in the history of present illness. He was evaluated in the context of the global COVID-19 pandemic, which necessitated consideration that the patient might be at risk for infection with the SARS-CoV-2 virus that causes COVID-19. Institutional protocols and algorithms that pertain to the evaluation of patients at risk for COVID-19 are in a state of rapid change based on information released by regulatory bodies including the CDC and federal and state organizations. These policies and algorithms were followed during the patient's  care in the ED.    73 year old male who presents with palpitations and dizziness after taking Afluzosin; similar symptoms in the past. Differential diagnosis includes, but is not limited to, ACS, aortic dissection, pulmonary embolism, cardiac tamponade, pneumothorax, pneumonia, pericarditis, myocarditis, GI-related causes including esophagitis/gastritis, and musculoskeletal chest wall pain.    Repeat troponins unremarkable.  Laboratory results notable for hyponatremia which is slightly worse than 1 and 2 weeks ago.  1 L IV  fluids were given.  States symptoms overall have improved.  Patient does not complain of overt generalized weakness causing difficulty to ambulate.  Advised him to hold further doses of Afluzosin and to speak with his urologist about an alternate medication.  Recommended follow-up with his PCP next week to recheck sodium level.  Strict return precautions given.  Patient verbalizes understanding and agrees with plan of care.      ____________________________________________   FINAL CLINICAL IMPRESSION(S) / ED DIAGNOSES  Final diagnoses:  Palpitations  Adverse effect of drug, initial encounter  Hyponatremia     ED Discharge Orders    None       Note:  This document was prepared using Dragon voice recognition software and may include unintentional dictation errors.   Paulette Blanch, MD 11/16/18 [phone]

## 2018-11-16 NOTE — ED Notes (Signed)
X-ray at bedside

## 2018-11-20 DIAGNOSIS — N451 Epididymitis: Secondary | ICD-10-CM | POA: Diagnosis not present

## 2018-11-20 DIAGNOSIS — R3914 Feeling of incomplete bladder emptying: Secondary | ICD-10-CM | POA: Diagnosis not present

## 2018-11-20 DIAGNOSIS — N401 Enlarged prostate with lower urinary tract symptoms: Secondary | ICD-10-CM | POA: Diagnosis not present

## 2018-11-20 DIAGNOSIS — R972 Elevated prostate specific antigen [PSA]: Secondary | ICD-10-CM | POA: Diagnosis not present

## 2018-11-20 DIAGNOSIS — D400 Neoplasm of uncertain behavior of prostate: Secondary | ICD-10-CM | POA: Diagnosis not present

## 2018-11-21 DIAGNOSIS — R972 Elevated prostate specific antigen [PSA]: Secondary | ICD-10-CM | POA: Diagnosis not present

## 2018-11-21 DIAGNOSIS — R3914 Feeling of incomplete bladder emptying: Secondary | ICD-10-CM | POA: Diagnosis not present

## 2018-11-21 DIAGNOSIS — D400 Neoplasm of uncertain behavior of prostate: Secondary | ICD-10-CM | POA: Diagnosis not present

## 2018-11-21 DIAGNOSIS — N401 Enlarged prostate with lower urinary tract symptoms: Secondary | ICD-10-CM | POA: Diagnosis not present

## 2018-11-23 ENCOUNTER — Other Ambulatory Visit: Payer: Self-pay | Admitting: Urology

## 2018-11-23 DIAGNOSIS — R972 Elevated prostate specific antigen [PSA]: Secondary | ICD-10-CM

## 2018-11-24 DIAGNOSIS — Z23 Encounter for immunization: Secondary | ICD-10-CM | POA: Diagnosis not present

## 2018-11-29 DIAGNOSIS — L308 Other specified dermatitis: Secondary | ICD-10-CM | POA: Diagnosis not present

## 2018-11-29 DIAGNOSIS — L853 Xerosis cutis: Secondary | ICD-10-CM | POA: Diagnosis not present

## 2018-12-01 DIAGNOSIS — L308 Other specified dermatitis: Secondary | ICD-10-CM | POA: Diagnosis not present

## 2018-12-04 ENCOUNTER — Ambulatory Visit
Admission: RE | Admit: 2018-12-04 | Discharge: 2018-12-04 | Disposition: A | Discharge status: Home / Self Care | Payer: Medicare Other | Source: Ambulatory Visit | Attending: Urology | Admitting: Urology

## 2018-12-04 ENCOUNTER — Other Ambulatory Visit: Payer: Self-pay

## 2018-12-04 ENCOUNTER — Encounter: Payer: Self-pay | Admitting: Radiology

## 2018-12-04 DIAGNOSIS — R972 Elevated prostate specific antigen [PSA]: Secondary | ICD-10-CM | POA: Insufficient documentation

## 2018-12-04 MED ORDER — GADOBUTROL 1 MMOL/ML IV SOLN
7.0000 mL | Freq: Once | INTRAVENOUS | Status: AC | PRN
  Administered 2018-12-04: 7 mL via INTRAVENOUS

## 2018-12-05 DIAGNOSIS — L230 Allergic contact dermatitis due to metals: Secondary | ICD-10-CM | POA: Diagnosis not present

## 2018-12-06 DIAGNOSIS — R972 Elevated prostate specific antigen [PSA]: Secondary | ICD-10-CM | POA: Diagnosis not present

## 2018-12-06 DIAGNOSIS — N401 Enlarged prostate with lower urinary tract symptoms: Secondary | ICD-10-CM | POA: Diagnosis not present

## 2018-12-06 DIAGNOSIS — R339 Retention of urine, unspecified: Secondary | ICD-10-CM | POA: Diagnosis not present

## 2018-12-18 DIAGNOSIS — N401 Enlarged prostate with lower urinary tract symptoms: Secondary | ICD-10-CM | POA: Diagnosis not present

## 2018-12-18 DIAGNOSIS — R972 Elevated prostate specific antigen [PSA]: Secondary | ICD-10-CM | POA: Diagnosis not present

## 2018-12-18 DIAGNOSIS — R339 Retention of urine, unspecified: Secondary | ICD-10-CM | POA: Diagnosis not present

## 2018-12-19 DIAGNOSIS — N401 Enlarged prostate with lower urinary tract symptoms: Secondary | ICD-10-CM | POA: Diagnosis not present

## 2018-12-19 DIAGNOSIS — R972 Elevated prostate specific antigen [PSA]: Secondary | ICD-10-CM | POA: Diagnosis not present

## 2018-12-20 DIAGNOSIS — L309 Dermatitis, unspecified: Secondary | ICD-10-CM | POA: Diagnosis not present

## 2018-12-20 DIAGNOSIS — L853 Xerosis cutis: Secondary | ICD-10-CM | POA: Diagnosis not present

## 2018-12-29 DIAGNOSIS — L309 Dermatitis, unspecified: Secondary | ICD-10-CM | POA: Diagnosis not present

## 2018-12-29 LAB — CBC AND DIFFERENTIAL
Hemoglobin: 11.8 — AB (ref 13.5–17.5)
Platelets: 316 (ref 150–399)
WBC: 4.9

## 2018-12-29 LAB — CK, TOTAL(REFL): CK Total: 84

## 2018-12-29 LAB — ANA: ANA Ab, IFA: NEGATIVE

## 2018-12-29 LAB — CHG TRB@ REARRANGEMENT ANAL DIRECT PROBE METHODOLOGY: ANA Direct: NEGATIVE

## 2018-12-29 LAB — ALDOLASE: Aldolase: 7.5

## 2018-12-29 LAB — PR DIGITAL ANALYSIS ELECTROENCEPHALOGRAM

## 2019-01-10 DIAGNOSIS — R972 Elevated prostate specific antigen [PSA]: Secondary | ICD-10-CM | POA: Diagnosis not present

## 2019-01-10 DIAGNOSIS — N401 Enlarged prostate with lower urinary tract symptoms: Secondary | ICD-10-CM | POA: Diagnosis not present

## 2019-01-11 ENCOUNTER — Encounter: Payer: Self-pay | Admitting: Family Medicine

## 2019-01-11 ENCOUNTER — Other Ambulatory Visit: Payer: Self-pay

## 2019-01-11 ENCOUNTER — Ambulatory Visit (INDEPENDENT_AMBULATORY_CARE_PROVIDER_SITE_OTHER): Payer: Medicare Other | Admitting: Family Medicine

## 2019-01-11 VITALS — BP 122/60 | HR 65 | Temp 97.2°F | Ht 69.0 in | Wt 162.1 lb

## 2019-01-11 DIAGNOSIS — D649 Anemia, unspecified: Secondary | ICD-10-CM

## 2019-01-11 DIAGNOSIS — R7989 Other specified abnormal findings of blood chemistry: Secondary | ICD-10-CM | POA: Diagnosis not present

## 2019-01-11 NOTE — Patient Instructions (Signed)
Go to the lab on the way out.  We'll contact you with your lab report. Don't change your meds for now.   Let me get your labs and we'll go from there.  Take care.  Glad to see you.

## 2019-01-11 NOTE — Progress Notes (Signed)
This visit occurred during the SARS-CoV-2 public health emergency.  Safety protocols were in place, including screening questions prior to the visit, additional usage of staff PPE, and extensive cleaning of exam room while observing appropriate contact time as indicated for disinfecting solutions.  Previous course discussed with patient.  He had seen dermatology, had lesions treated on his back with liq N2.  Then he had other lesions erupt in the meantime. Treated with doxycycline and dexamethasone in the meantime.  He had urinary retention, needed foley.  Foley removed, then had UTI.    He had second opinion from dermatology (initially Dr. Nehemiah Massed, then Dr. Evorn Gong).  He was told he was anemic recently. He was given advice to f/u here about anemia.    He has h/o mild chronic hyponatremia.    CXR 11/2018 FINDINGS: There are postsurgical changes throughout the right hemithorax with volume loss and elevation of the right hemidiaphragm. Chronic lung changes are noted bilaterally.  IMPRESSION: No active disease.  Recent MRI prostate with no evidence of macroscopic or high-grade prostate carcinoma.  IMPRESSION: No active disease.  Colonoscopy 2019.    No FCNAVD.  He doesn't feel unwell.  No CP, not SOB.  He has some itching still noted and the affected area tends to migrate w/o clear trigger.    Weight today stable compared to prev.  He had some prev weight loss that he likely regained. No night sweats.  No blood in stool.    PMH and SH reviewed  ROS: Per HPI unless specifically indicated in ROS section   Meds, vitals, and allergies reviewed.   nad ncat Neck supple, no LA rrr SEM with h/o aortic stenosis, murmur at baseline.  Ctab, no wheeze.   Abdomen soft, nontender, normal bowel sounds. Diffuse blanching rash on the trunk and ext, irregular distribution. No ulceration.  Skin well perfused.

## 2019-01-14 ENCOUNTER — Other Ambulatory Visit: Payer: Self-pay | Admitting: Family Medicine

## 2019-01-14 ENCOUNTER — Encounter: Payer: Self-pay | Admitting: Family Medicine

## 2019-01-14 DIAGNOSIS — D649 Anemia, unspecified: Secondary | ICD-10-CM | POA: Insufficient documentation

## 2019-01-14 NOTE — Assessment & Plan Note (Signed)
Unclear source.  He has a distant history of melanoma and also lung cancer.  He has a history of elevated PSA.  He has seen dermatology recently, he had recent chest x-ray with no active disease, and had recent MRI prostate with no evidence of macroscopic or high-grade prostate carcinoma.  He has had urology follow-up.  He is up-to-date on colonoscopy, done approximately 1 year ago.  He has not seen blood in his stool.  He is aware that an occult malignancy is within the differential for his situation.  At this point there is no evidence of any active known malignancy.  Discussed.  Reasonable to recheck routine labs today.  See notes on labs.  At this point still okay for outpatient follow-up.  He has a benign exam that is as expected, meaning he still has his cardiac murmur at baseline and he has the rash on his trunk as expected.  He agrees with plan.  At least 30 minutes were devoted to patient care in this encounter (this can potentially include time spent reviewing the patient's file/history, interviewing and examining the patient, counseling/reviewing plan with patient, ordering referrals, ordering tests, reviewing relevant laboratory or x-ray data, and documenting the encounter).

## 2019-01-15 LAB — COMPREHENSIVE METABOLIC PANEL
AG Ratio: 1.8 (calc) (ref 1.0–2.5)
ALT: 16 U/L (ref 9–46)
AST: 16 U/L (ref 10–35)
Albumin: 4 g/dL (ref 3.6–5.1)
Alkaline phosphatase (APISO): 54 U/L (ref 35–144)
BUN: 20 mg/dL (ref 7–25)
CO2: 25 mmol/L (ref 20–32)
Calcium: 9.3 mg/dL (ref 8.6–10.3)
Chloride: 105 mmol/L (ref 98–110)
Creat: 0.93 mg/dL (ref 0.70–1.18)
Globulin: 2.2 g/dL (calc) (ref 1.9–3.7)
Glucose, Bld: 117 mg/dL — ABNORMAL HIGH (ref 65–99)
Potassium: 4.3 mmol/L (ref 3.5–5.3)
Sodium: 138 mmol/L (ref 135–146)
Total Bilirubin: 0.6 mg/dL (ref 0.2–1.2)
Total Protein: 6.2 g/dL (ref 6.1–8.1)

## 2019-01-15 LAB — CBC WITH DIFFERENTIAL/PLATELET
Absolute Monocytes: 768 cells/uL (ref 200–950)
Basophils Absolute: 51 cells/uL (ref 0–200)
Basophils Relative: 0.8 %
Eosinophils Absolute: 326 cells/uL (ref 15–500)
Eosinophils Relative: 5.1 %
HCT: 35.7 % — ABNORMAL LOW (ref 38.5–50.0)
Hemoglobin: 12.1 g/dL — ABNORMAL LOW (ref 13.2–17.1)
Lymphs Abs: 1395 cells/uL (ref 850–3900)
MCH: 31.1 pg (ref 27.0–33.0)
MCHC: 33.9 g/dL (ref 32.0–36.0)
MCV: 91.8 fL (ref 80.0–100.0)
MPV: 10 fL (ref 7.5–12.5)
Monocytes Relative: 12 %
Neutro Abs: 3859 cells/uL (ref 1500–7800)
Neutrophils Relative %: 60.3 %
Platelets: 346 10*3/uL (ref 140–400)
RBC: 3.89 10*6/uL — ABNORMAL LOW (ref 4.20–5.80)
RDW: 14 % (ref 11.0–15.0)
Total Lymphocyte: 21.8 %
WBC: 6.4 10*3/uL (ref 3.8–10.8)

## 2019-01-15 LAB — IRON: Iron: 131 ug/dL (ref 50–180)

## 2019-01-15 LAB — TSH: TSH: 2.09 mIU/L (ref 0.40–4.50)

## 2019-01-15 LAB — PATHOLOGIST SMEAR REVIEW

## 2019-01-16 ENCOUNTER — Telehealth: Payer: Self-pay | Admitting: *Deleted

## 2019-01-16 NOTE — Telephone Encounter (Signed)
Patient left a voicemail wanting to follow-up on the referral that is suppose to be done for hematology.

## 2019-01-16 NOTE — Telephone Encounter (Signed)
Spoke w/pt.  Placed on [facility] WQ.  Pt aware may be a few weeks before they call him to schedule. [place]

## 2019-01-19 ENCOUNTER — Inpatient Hospital Stay: Payer: Medicare Other

## 2019-01-19 ENCOUNTER — Telehealth: Payer: Self-pay | Admitting: *Deleted

## 2019-01-19 ENCOUNTER — Other Ambulatory Visit: Payer: Self-pay

## 2019-01-19 ENCOUNTER — Inpatient Hospital Stay: Payer: Medicare Other | Attending: Oncology | Admitting: Oncology

## 2019-01-19 ENCOUNTER — Encounter: Payer: Self-pay | Admitting: Oncology

## 2019-01-19 ENCOUNTER — Other Ambulatory Visit: Payer: Self-pay | Admitting: Emergency Medicine

## 2019-01-19 VITALS — BP 144/75 | HR 75 | Temp 96.6°F | Resp 1 | Ht 69.0 in | Wt 164.9 lb

## 2019-01-19 DIAGNOSIS — Z79899 Other long term (current) drug therapy: Secondary | ICD-10-CM | POA: Insufficient documentation

## 2019-01-19 DIAGNOSIS — E785 Hyperlipidemia, unspecified: Secondary | ICD-10-CM | POA: Insufficient documentation

## 2019-01-19 DIAGNOSIS — R21 Rash and other nonspecific skin eruption: Secondary | ICD-10-CM | POA: Diagnosis not present

## 2019-01-19 DIAGNOSIS — D649 Anemia, unspecified: Secondary | ICD-10-CM

## 2019-01-19 DIAGNOSIS — Z85118 Personal history of other malignant neoplasm of bronchus and lung: Secondary | ICD-10-CM | POA: Insufficient documentation

## 2019-01-19 DIAGNOSIS — I10 Essential (primary) hypertension: Secondary | ICD-10-CM | POA: Insufficient documentation

## 2019-01-19 DIAGNOSIS — Z87891 Personal history of nicotine dependence: Secondary | ICD-10-CM | POA: Insufficient documentation

## 2019-01-19 LAB — CBC
HCT: 37.8 % — ABNORMAL LOW (ref 39.0–52.0)
Hemoglobin: 12.1 g/dL — ABNORMAL LOW (ref 13.0–17.0)
MCH: 30.5 pg (ref 26.0–34.0)
MCHC: 32 g/dL (ref 30.0–36.0)
MCV: 95.2 fL (ref 80.0–100.0)
Platelets: 316 10*3/uL (ref 150–400)
RBC: 3.97 MIL/uL — ABNORMAL LOW (ref 4.22–5.81)
RDW: 15.5 % (ref 11.5–15.5)
WBC: 5.4 10*3/uL (ref 4.0–10.5)
nRBC: 0 % (ref 0.0–0.2)

## 2019-01-19 LAB — RETICULOCYTES
Immature Retic Fract: 9.4 % (ref 2.3–15.9)
RBC.: 3.91 MIL/uL — ABNORMAL LOW (ref 4.22–5.81)
Retic Count, Absolute: 103.6 10*3/uL (ref 19.0–186.0)
Retic Ct Pct: 2.7 % (ref 0.4–3.1)

## 2019-01-19 LAB — SEDIMENTATION RATE: Sed Rate: 21 mm/hr — ABNORMAL HIGH (ref 0–20)

## 2019-01-19 LAB — IRON AND TIBC
Iron: 128 ug/dL (ref 45–182)
Saturation Ratios: 36 % (ref 17.9–39.5)
TIBC: 353 ug/dL (ref 250–450)
UIBC: 225 ug/dL

## 2019-01-19 LAB — DAT, POLYSPECIFIC AHG (ARMC ONLY): Polyspecific AHG test: NEGATIVE

## 2019-01-19 LAB — LACTATE DEHYDROGENASE: LDH: 190 U/L (ref 98–192)

## 2019-01-19 LAB — VITAMIN B12: Vitamin B-12: 161 pg/mL — ABNORMAL LOW (ref 180–914)

## 2019-01-19 LAB — FERRITIN: Ferritin: 222 ng/mL (ref 24–336)

## 2019-01-19 LAB — FOLATE: Folate: 13.8 ng/mL (ref >5.9)

## 2019-01-19 NOTE — Progress Notes (Signed)
Brett Mcguire  [phone] [phone]  ID: Brett Mcguire OB: [birth year]  MR#: [id number]  ZJI#:[id number]  Patient Care Team: Tonia Ghent, MD as PCP - General (Family Medicine) Minna Merritts, MD as Consulting Physician (Cardiology) Royston Cowper, MD (Urology)  CHIEF COMPLAINT: Anemia unspecified, rash.  INTERVAL HISTORY: Patient is a 74 year old male who was in his usual state of health until August 29, 2018 when he developed a pruritic rash mainly on his torso and upper extremities.  Since that time he has seen several dermatologist as well as multiple treatments with dexamethasone, doxycycline, and topical creams with no real effect on his rash.  He was also noted to have a mild normocytic anemia.  Other than his rash he feels well.  He has no neurologic complaints.  He denies any recent fevers or illnesses.  He has a good appetite and denies weight loss.  He has no chest pain, shortness of breath, cough, or hemoptysis.  He denies any nausea, vomiting, constipation, or diarrhea.  He has no melena or hematochezia.  He has no urinary complaints.  Patient otherwise feels well and offers no further specific complaints today.   REVIEW OF SYSTEMS:   Review of Systems  Constitutional: Negative.  Negative for fever, malaise/fatigue and weight loss.  Respiratory: Negative.  Negative for cough and hemoptysis.   Cardiovascular: Negative for chest pain and leg swelling.  Gastrointestinal: Negative.  Negative for abdominal pain, blood in stool and melena.  Genitourinary: Negative.  Negative for hematuria.  Musculoskeletal: Negative.  Negative for back pain.  Skin: Positive for itching and rash.  Neurological: Negative.  Negative for dizziness, focal weakness, weakness and headaches.  Psychiatric/Behavioral: Negative.  The patient is not nervous/anxious.     As per HPI. Otherwise, a complete review of systems is negative.  PAST MEDICAL  HISTORY: Past Medical History:  Diagnosis Date  . Allergy 1989  . Aortic stenosis   . 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 Diverticulosis 02/2000  . Hyperlipidemia 1989  . Hypertension   . Lung cancer ([place]) 1989   s/p lobectomy, no chemo or rady  . Melanoma of skin ([place])    chest  . Murmur    aortic stenosis    PAST SURGICAL HISTORY: Past Surgical History:  Procedure Laterality Date  . COLONOSCOPY  02/18/2000   Polyp, diverticulosis, repeat in 5 years  . COLONOSCOPY  03/03/2006   Repeat  -  Divertics  . COLONOSCOPY WITH PROPOFOL N/A 12/22/2017   Procedure: COLONOSCOPY WITH PROPOFOL;  Surgeon: Virgel Manifold, MD;  Location: [facility] ENDOSCOPY;  Service: Endoscopy;  Laterality: N/A;  . CYSTOSCOPY WITH INSERTION OF UROLIFT    . Laminectomy/Discectomy  11/03/2005   L5/S1  . Malignant melanoma excision  06/20/2003   Dr. Nehemiah Massed  . MRI  10/09/2005   Lumbar spine, bulging disc L5/S1  . Right lobectomy  1989   Lung cancer  . TONSILLECTOMY AND ADENOIDECTOMY      FAMILY HISTORY: Family History  Problem Relation Age of Onset  . Cancer Mother        Carbon throat  . Alcohol abuse Mother   . Cancer Father        4 kinds; 5 places, prostate and lung  . Prostate cancer Father        possible prostate cancer  . Cancer Other        Skin, deceased in April 03, 1994  . Colon cancer Neg  Hx        none known    ADVANCED DIRECTIVES (Y/N):  N  HEALTH MAINTENANCE: Social History   Tobacco Use  . Smoking status: Former Smoker    Packs/day: 2.00    Years: 17.00    Pack years: 34.00    Types: Cigarettes    Quit date: 02/22/1982    Years since quitting: 36.9  . Smokeless tobacco: Never Used  Substance Use Topics  . Alcohol use: Yes    Alcohol/week: 10.0 standard drinks    Types: 10 Glasses of wine per week    Comment: wine  . Drug use: No     Colonoscopy:  PAP:  Bone density:  Lipid panel:  Allergies  Allergen Reactions  . Alfuzosin     Skin rash, burning  sensation of skin  . Cialis [Tadalafil] Other (See Comments)    heartburn  . Doxazosin     Skin rash, burning sensation of skin  . Ephedrine Other (See Comments)    BP spikes   . Finasteride     diarrhea  . Flomax [Tamsulosin Hcl]     Rash  . Rapaflo [Silodosin]     rash  . Sulfonamide Derivatives     REACTION: as child    Current Outpatient Medications  Medication Sig Dispense Refill  . amLODipine (NORVASC) 10 MG tablet TAKE 1 TABLET BY MOUTH DAILY 90 tablet 3  . triamcinolone cream (KENALOG) 0.1 % APPLY TO AFFECTED AREAS ON LOWER LEG EVERY DAY AS NEEDED FOR ITCHING 30 g 1   No current facility-administered medications for this visit.    OBJECTIVE: Vitals:   01/19/19 1118  BP: (!) 144/75  Pulse: 75  Resp: (!) 1  Temp: (!) 96.6 F (35.9 C)     Body mass index is 24.35 kg/m.    ECOG FS:0 - Asymptomatic  General: Well-developed, well-nourished, no acute distress. Eyes: Pink conjunctiva, anicteric sclera. HEENT: Normocephalic, moist mucous membranes. Lungs: No audible wheezing or coughing. Heart: Regular rate and rhythm. Abdomen: Soft, nontender, no obvious distention. Musculoskeletal: No edema, cyanosis, or clubbing. Neuro: Alert, answering all questions appropriately. Cranial nerves grossly intact. Skin: Confluent rash noted on torso and upper extremities. Psych: Normal affect. Lymphatics: No cervical, calvicular, axillary or inguinal LAD.   LAB RESULTS:  Lab Results  Component Value Date   NA 138 01/11/2019   K 4.3 01/11/2019   CL 105 01/11/2019   CO2 25 01/11/2019   GLUCOSE 117 (H) 01/11/2019   BUN 20 01/11/2019   CREATININE 0.93 01/11/2019   CALCIUM 9.3 01/11/2019   PROT 6.2 01/11/2019   ALBUMIN 3.4 (L) 11/15/2018   AST 16 01/11/2019   ALT 16 01/11/2019   ALKPHOS 48 11/15/2018   BILITOT 0.6 01/11/2019   GFRNONAA >60 11/15/2018   GFRAA >60 11/15/2018    Lab Results  Component Value Date   WBC 6.4 01/11/2019   NEUTROABS 3,859 01/11/2019    HGB 12.1 (L) 01/11/2019   HCT 35.7 (L) 01/11/2019   MCV 91.8 01/11/2019   PLT 346 01/11/2019   Lab Results  Component Value Date   IRON 128 01/19/2019   TIBC 353 01/19/2019   IRONPCTSAT 36 01/19/2019   Lab Results  Component Value Date   FERRITIN 222 01/19/2019     STUDIES: No results found.  ASSESSMENT: Anemia unspecified, rash.  PLAN:    1.  Anemia: Mild.  Hemoglobin is only 12.1.  Iron stores, folate, and hemolysis labs are all negative or within normal limits.  Reticulocyte count is appropriately normal.  Despite patient's extensive rash, his sed rate is only mildly elevated at 21.  B12, ANA, SPEP, and peripheral blood flow cytometry are all pending at time of dictation.  No intervention is needed at this time.  Return to clinic in 3 weeks for further evaluation and discussion of his laboratory work. 2.  Rash: Unclear etiology.  Patient states he has been evaluated by 2 separate dermatologist and has had biopsies that were unrevealing.  We do not have these encounters.  Patient also reports he has been on dexamethasone as well as doxycycline with minimal improvement of his rash.  Plan to obtain pathology from biopsies.  Unlikely related to his anemia.  Will consider CT scans of the chest, abdomen, and pelvis for completeness. 3.  History of lung cancer: Patient had lobectomy in 1989 and did not require adjuvant chemo or XRT.    I spent a total of 60 minutes face-to-face with the patient and reviewing chart data of which greater than 50% of the visit was spent in counseling and coordination of care as detailed above.   Patient expressed understanding and was in agreement with this plan. He also understands that He can call clinic at any time with any questions, concerns, or complaints.   Cancer Staging No matching staging information was found for the patient.  Lloyd Huger, MD   01/19/2019 12:23 PM

## 2019-01-19 NOTE — Progress Notes (Signed)
New patient in for anemia.Very discouraged with skin itching and rash like areas on arms and chest, states primary MD feels it maybe blood/anemia related.

## 2019-01-19 NOTE — Telephone Encounter (Signed)
error 

## 2019-01-20 LAB — HAPTOGLOBIN: Haptoglobin: 91 mg/dL (ref 34–355)

## 2019-01-20 LAB — ERYTHROPOIETIN: Erythropoietin: 11.3 m[IU]/mL (ref 2.6–18.5)

## 2019-01-20 LAB — ANA W/REFLEX: Anti Nuclear Antibody (ANA): NEGATIVE

## 2019-01-22 LAB — PROTEIN ELECTROPHORESIS, SERUM
A/G Ratio: 1.7 (ref 0.7–1.7)
Albumin ELP: 4 g/dL (ref 2.9–4.4)
Alpha-1-Globulin: 0.2 g/dL (ref 0.0–0.4)
Alpha-2-Globulin: 0.5 g/dL (ref 0.4–1.0)
Beta Globulin: 0.8 g/dL (ref 0.7–1.3)
Gamma Globulin: 1 g/dL (ref 0.4–1.8)
Globulin, Total: 2.4 g/dL (ref 2.2–3.9)
Total Protein ELP: 6.4 g/dL (ref 6.0–8.5)

## 2019-01-24 LAB — COMP PANEL: LEUKEMIA/LYMPHOMA

## 2019-01-30 ENCOUNTER — Telehealth: Payer: Self-pay | Admitting: *Deleted

## 2019-01-30 NOTE — Telephone Encounter (Signed)
Slightly low B12, o/w normal.

## 2019-01-30 NOTE — Telephone Encounter (Signed)
Patient called asking for results, his next appointment is not until 1/29 CBC Order: [id number]  Status: Final result  Visible to patient: No (inaccessible in MyChart)  Next appt: 02/09/2019 at 10:30 AM in Oncology Lloyd Huger, MD)  Dx: Anemia, unspecified type    Ref Range & Units 11 d ago  WBC 4.0 - 10.5 K/uL 5.4   RBC 4.22 - 5.81 MIL/uL 3.97  Hemoglobin 13.0 - 17.0 g/dL 12.1  HCT 39.0 - 52.0 % 37.8  MCV 80.0 - 100.0 fL 95.2   MCH 26.0 - 34.0 pg 30.5   MCHC 30.0 - 36.0 g/dL 32.0   RDW 11.5 - 15.5 % 15.5   Platelets 150 - 400 K/uL 316   nRBC 0.0 - 0.2 % 0.0   Comment: Performed at [facility], [address] [place], [place] [address]  Resulting Agency  [facility] CLIN LAB     Specimen Collected: 01/19/19 12:12   Last Resulted: 01/19/19 12:26   Lab Flowsheet  Order Details  View Encounter  Lab and Collection Details  Routing  Result History      Other Results from 01/19/2019 Flow cytometry panel-leukemia/lymphoma work-up  Status: Edited Result - FINAL  Visible to patient: No (inaccessible in MyChart)  Next appt: 02/09/2019 at 10:30 AM in Oncology Lloyd Huger, MD)  Dx: Anemia, unspecified type  Order: [id number]  Component 11 d ago  PATH INTERP XXX-IMP Comment   Comment: No significant immunophenotypic abnormality detected  CLINICAL INFO Comment VC   Comment: (NOTE)  Anemia  Accompanying CBC dated 01/19/2019 shows: WBC count 5.4, Hgb 12.1, Plt  316K.    Specimen Type Comment   Comment: Peripheral blood  ASSESSMENT OF LEUKOCYTES Comment   Comment: (NOTE)  No monoclonal B cell population is detected.  There is no loss of, or aberrant expression of, the pan T cell  antigens to  suggest a neoplastic T cell process.  CD4:CD8 ratio 2.7  No circulating blasts are detected.  There is no immunophenotypic evidence of abnormal myeloid maturation.  Analysis of the lymphocyte population shows: B cells 12%, T cells  82%, NK  cells 6%.    % Viable Cells  Comment VC   Comment: 86%  ANALYSIS AND GATING STRATEGY Comment   Comment: 8 color analysis with CD45/SSC gating  IMMUNOPHENOTYPING STUDY Comment   Comment: (NOTE)  CD2 Normal CD3 Normal  CD4 Normal CD5 Normal  CD7 Normal CD8 Normal  CD10 Normal CD11b Normal  CD13 Normal CD14 Normal  CD16 Normal CD19 Normal  CD20 Normal CD33 Normal  CD34 Normal CD38 Normal  CD45 Normal CD56 Normal  CD57 Normal CD117 Normal  HLA-DR Normal KAPPA Normal  LAMBDA Normal CD64 Normal    PATHOLOGIST NAME Comment   Comment: Smiley Houseman, M.D.  COMMENT: Comment VC   Comment: (NOTE)  Each antibody in this assay was utilized to assess for potential  abnormalities of studied cell populations or to characterize  identified abnormalities.  This test was developed and its performance characteristics  determined by [facility]. It has not been cleared or approved by the  U.S. Food and Drug Administration.  The FDA has determined that such clearance or approval is not  necessary. This test is used for clinical purposes. It should not  be regarded as investigational or for research.  Performed At: -[facility]  [address] [place], [place] [id number]  Katina Degree MDPhD [id number]  Performed At: [place]  [place], [place] [id number]  Katina Degree MDPhD VO:[id number]    Resulting Agency [facility] CLIN LAB     Specimen Collected: 01/19/19 12:12   Last Resulted: 01/24/19 13:37   Lab Flowsheet  Order Details  View Encounter  Lab and Collection Details  Routing  Result History    VC=Value has a corrected status       Protein electrophoresis, serum  Status: Edited Result - FINAL  Visible to patient: No (inaccessible in MyChart)  Next appt: 02/09/2019 at 10:30 AM in Oncology Lloyd Huger, MD)  Dx: Anemia, unspecified type  Order: [id number]    Newer results are available. Click to view them now.    Ref Range & Units 11 d ago  Total Protein ELP 6.0 - 8.5 g/dL 6.4   Albumin  ELP 2.9 - 4.4 g/dL 4.0   Alpha-1-Globulin 0.0 - 0.4 g/dL 0.2   Alpha-2-Globulin 0.4 - 1.0 g/dL 0.5   Beta Globulin 0.7 - 1.3 g/dL 0.8   Gamma Globulin 0.4 - 1.8 g/dL 1.0   M-Spike, % Not Observed g/dL Not Observed   SPE Interp.  Comment   Comment: (NOTE)  The SPE pattern appears unremarkable. Evidence of monoclonal  protein is not apparent.  Performed At: [facility]  [place], [place] [id number]  Rush Farmer MD OE:[id number]    Comment  Comment   Comment: (NOTE)  Protein electrophoresis scan will follow via computer, mail, or  courier delivery.    Globulin, Total 2.2 - 3.9 g/dL 2.4 VC   A/G Ratio 0.7 - 1.7 1.7 VC   Resulting Agency  [facility]IN LAB     Specimen Collected: 01/19/19 12:12   Last Resulted: 01/22/19 15:36   Lab Flowsheet  Order Details  View Encounter  Lab and Collection Details  Routing  Result History    VC=Value has a corrected status       ANA w/Reflex  Status: Final result  Visible to patient: No (inaccessible in MyChart)  Next appt: 02/09/2019 at 10:30 AM in Oncology Lloyd Huger, MD)  Dx: Anemia, unspecified type  Order: [id number]    Ref Range & Units 11 d ago  Anti Nuclear Antibody (ANA) Negative Negative   Comment: (NOTE)  Performed At: [facility]  [place], [place] [id number]  Rush Farmer MD [id number]:[id number]    Resulting Agency  [facility] CLIN LAB     Specimen Collected: 01/19/19 12:12   Last Resulted: 01/20/19 14:36   Lab Flowsheet  Order Details  View Encounter  Lab and Collection Details  Routing  Result History         DAT, polyspecific, [facility])  Status: Final result  Visible to patient: No (inaccessible in MyChart)  Next appt: 02/09/2019 at 10:30 AM in Oncology Lloyd Huger, MD)  Dx: Anemia, unspecified type  Order: [id number]  Component 11 d ago  Polyspecific AHG test NEG  Performed at [facility], [address] [place], [place] [address]     [place] [facility] LAB     Specimen Collected: 01/19/19 12:12   Last Resulted: 01/19/19 18:36   Lab Flowsheet  Order Details  View Encounter  Lab and Collection Details  Routing  Result History         Folic Acid  Status: Final result  Visible to patient: No (inaccessible in MyChart)  Next appt: 02/09/2019 at 10:30 AM in Oncology Lloyd Huger, MD)  Dx: Anemia, unspecified type  Order: [id number]  Ref Range & Units 11 d ago  Folate >5.9 ng/mL 13.8   Comment: Performed at [facility], [address] [place], [place] [address]  Resulting Agency  [facility] CL[facility] LAB     Specimen Collected: 01/19/19 12:12   Last Resulted: 01/19/19 14:16   Lab Flowsheet  Order Details  View Encounter  Lab and Collection Details  Routing  Result History         Haptoglobin  Status: Final result  Visible to patient: No (inaccessible in MyChart)  Next appt: 02/09/2019 at 10:30 AM in Oncology Lloyd Huger, MD)  Dx: Anemia, unspecified type  Order: [id number]    Ref Range & Units 11 d ago  Haptoglobin 34 - 355 mg/dL 91   Comment: (NOTE)  Performed At: [facility]  [place], [place] [id number]  Rush Farmer MD [id number]    Resulting Agency  [facility] CL[facility] LAB     Specimen Collected: 01/19/19 12:12   Last Resulted: 01/20/19 06:36   Lab Flowsheet  Order Details  View Encounter  Lab and Collection Details  Routing  Result History         Vitamin B12  Status: Final result  Visible to patient: No (inaccessible in [place])  Next appt: 02/09/2019 at 10:30 AM in Oncology Lloyd Huger, MD)  Dx: Anemia, unspecified type  Order: [id number]    Ref Range & Units 11 d ago  Vitamin B-12 180 - 914 pg/mL 161  Comment: (NOTE)  This assay is not validated for testing neonatal or  myeloproliferative syndrome specimens for Vitamin B12 levels.  Performed at [facility], [address], [place], [place]  [address]    Resulting  Agency  [facility] CLIN LAB     Specimen Collected: 01/19/19 12:12   Last Resulted: 01/19/19 20:18   Lab Flowsheet  Order Details  View Encounter  Lab and Collection Details  Routing  Result History         Erythropoietin  Status: Final result  Visible to patient: No (inaccessible in MyChart)  Next appt: 02/09/2019 at 10:30 AM in Oncology Lloyd Huger, MD)  Dx: Anemia, unspecified type  Order: [id number]    Ref Range & Units 11 d ago  Erythropoietin 2.6 - 18.5 mIU/mL 11.3   Comment: (NOTE)  [employer] UniCel DxI [place] obtained with different assay methods or kits cannot be used  interchangeably. Results cannot be interpreted as absolute evidence  of the presence or absence of malignant disease.  Performed At: [facility]  [place], [place] [id number]  Rush Farmer MD [id number]    Resulting Agency  [facility] CLIN LAB     Specimen Collected: 01/19/19 12:12   Last Resulted: 01/20/19 06:36   Lab Flowsheet  Order Details  View Encounter  Lab and Collection Details  Routing  Result History         Lactate dehydrogenase  Status: Final result  Visible to patient: No (inaccessible in MyChart)  Next appt: 02/09/2019 at 10:30 AM in Oncology Lloyd Huger, MD)  Dx: Anemia, unspecified type  Order: [id number]    Ref Range & Units 11 d ago  LDH 98 - 192 U/L 190   Comment: Performed at [facility], [address], [place], [place] [address]  [place]  [facility] CLIN LAB     Specimen Collected: 01/19/19 12:12   Last Resulted: 01/19/19 12:35   Lab Flowsheet  Order Details  View  Encounter  Lab and Collection Details  Routing  Result History         Iron and TIBC  Status: Final result  Visible to patient: No (inaccessible in MyChart)  Next appt: 02/09/2019 at 10:30 AM in Oncology Lloyd Huger, MD)  Dx: Anemia, unspecified type  Order: [id number]    Ref Range & Units 11 d ago  Iron 45 - 182  ug/dL 128   TIBC 250 - 450 ug/dL 353   Saturation Ratios 17.9 - 39.5 % 36   UIBC ug/dL 225   Comment: Performed at [facility], [address] [place], [place] [address]  Resulting Agency  [facility] CLIN LAB     Specimen Collected: 01/19/19 12:12   Last Resulted: 01/19/19 14:16   Lab Flowsheet  Order Details  View Encounter  Lab and Collection Details  Routing  Result History         Ferritin  Status: Final result  Visible to patient: No (inaccessible in MyChart)  Next appt: 02/09/2019 at 10:30 AM in Oncology Lloyd Huger, MD)  Dx: Anemia, unspecified type  Order: [id number]    Ref Range & Units 11 d ago  Ferritin 24 - 336 ng/mL 222   Comment: Performed at [facility], [address] [place], [place] [address]  Resulting Agency  [facility] CLIN LAB     Specimen Collected: 01/19/19 12:12   Last Resulted: 01/19/19 14:16   Lab Flowsheet  Order Details  View Encounter  Lab and Collection Details  Routing  Result History         Sedimentation rate  Status: Final result  Visible to patient: No (inaccessible in MyChart)  Next appt: 02/09/2019 at 10:30 AM in Oncology Lloyd Huger, MD)  Dx: Anemia, unspecified type  Order: [id number]    Ref Range & Units 11 d ago  Sed Rate 0 - 20 mm/hr 21  Comment: Performed at [facility], [address] [place], [place] [address]  Resulting Agency  [facility] CLIN LAB     Specimen Collected: 01/19/19 12:12   Last Resulted: 01/19/19 14:16   Lab Flowsheet  Order Details  View Encounter  Lab and Collection Details  Routing  Result History         Reticulocytes  Status: Final result  Visible to patient: No (inaccessible in MyChart)  Next appt: 02/09/2019 at 10:30 AM in Oncology Lloyd Huger, MD)  Dx: Anemia, unspecified type  Order: [id number]    Ref Range & Units 11 d ago  Retic Ct Pct 0.4 - 3.1 % 2.7   RBC. 4.22 - 5.81 MIL/uL 3.91  Retic Count, Absolute 19.0 - 186.0 K/uL 103.6    Immature Retic Fract 2.3 - 15.9 % 9.4   Comment: Performed at [facility], [address] [place], [place] [address]  Resulting Agency  [facility] CLIN LAB     Specimen Collected: 01/19/19 12:12   Last Resulted: 01/19/19 12:26

## 2019-01-30 NOTE — Telephone Encounter (Signed)
Call returned to patient and advised of doctor response.

## 2019-02-02 ENCOUNTER — Encounter: Payer: Self-pay | Admitting: Dermatology

## 2019-02-03 NOTE — Progress Notes (Addendum)
[place]  [phone] [phone]) [phone]  ID: NICKLAUS ALVIAR OB: Mar 16, 1945  MR#: [id number]  OXB#:[id number]  Patient Care Team: Tonia Ghent, MD as PCP - General (Family Medicine) Minna Merritts, MD as Consulting Physician (Cardiology) Royston Cowper, MD (Urology)  CHIEF COMPLAINT: Anemia unspecified, rash.  INTERVAL HISTORY: Patient returns to clinic today for further evaluation and discussion of his laboratory work.  He continues to have a pruritic rash that is unchanged.  He otherwise feels well.   He has no neurologic complaints.  He denies any recent fevers or illnesses.  He has good appetite and initially had some unintentional weight loss, but is slowly gaining weight back.  He has no chest pain, shortness of breath, cough, or hemoptysis.  He denies any nausea, vomiting, constipation, or diarrhea.  He has no melena or hematochezia.  He has no urinary complaints.  Patient offers no further specific complaints today.  REVIEW OF SYSTEMS:   Review of Systems  Constitutional: Negative.  Negative for fever, malaise/fatigue and weight loss.  Respiratory: Negative.  Negative for cough and hemoptysis.   Cardiovascular: Negative for chest pain and leg swelling.  Gastrointestinal: Negative.  Negative for abdominal pain, blood in stool and melena.  Genitourinary: Negative.  Negative for hematuria.  Musculoskeletal: Negative.  Negative for back pain.  Skin: Positive for itching and rash.  Neurological: Negative.  Negative for dizziness, focal weakness, weakness and headaches.  Psychiatric/Behavioral: Negative.  The patient is not nervous/anxious.     As per HPI. Otherwise, a complete review of systems is negative.  PAST MEDICAL HISTORY: Past Medical History:  Diagnosis Date  . Allergy 1989  . Aortic stenosis   . 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 Diverticulosis 02/2000  . Hyperlipidemia 1989  . Hypertension   . Lung cancer  ([place]) 1989   s/p lobectomy, no chemo or rady  . Melanoma of skin ([place])    chest  . Murmur    aortic stenosis    PAST SURGICAL HISTORY: Past Surgical History:  Procedure Laterality Date  . COLONOSCOPY  02/18/2000   Polyp, diverticulosis, repeat in 5 years  . COLONOSCOPY  03/03/2006   Repeat  -  Divertics  . COLONOSCOPY WITH PROPOFOL N/A 12/22/2017   Procedure: COLONOSCOPY WITH PROPOFOL;  Surgeon: Virgel Manifold, MD;  Location: [facility] ENDOSCOPY;  Service: Endoscopy;  Laterality: N/A;  . CYSTOSCOPY WITH INSERTION OF UROLIFT    . Laminectomy/Discectomy  11/03/2005   L5/S1  . Malignant melanoma excision  06/20/2003   Dr. Nehemiah Massed  . MRI  10/09/2005   Lumbar spine, bulging disc L5/S1  . Right lobectomy  1989   Lung cancer  . TONSILLECTOMY AND ADENOIDECTOMY      FAMILY HISTORY: Family History  Problem Relation Age of Onset  . Cancer Mother        [place] throat  . Alcohol abuse Mother   . Cancer Father        4 kinds; 5 places, prostate and lung  . Prostate cancer Father        possible prostate cancer  . Cancer Other        Skin, deceased in 1994/04/15  . Colon cancer Neg Hx        none known    ADVANCED DIRECTIVES (Y/N):  N  HEALTH MAINTENANCE: Social History   Tobacco Use  . Smoking status: Former Smoker    Packs/day: 2.00    Years:  17.00    Pack years: 34.00    Types: Cigarettes    Quit date: 02/22/1982    Years since quitting: 36.9  . Smokeless tobacco: Never Used  Substance Use Topics  . Alcohol use: Yes    Alcohol/week: 10.0 standard drinks    Types: 10 Glasses of wine per week    Comment: wine  . Drug use: No     Colonoscopy:  PAP:  Bone density:  Lipid panel:  Allergies  Allergen Reactions  . Alfuzosin     Skin rash, burning sensation of skin  . Cialis [Tadalafil] Other (See Comments)    heartburn  . Doxazosin     Skin rash, burning sensation of skin  . Ephedrine Other (See Comments)    BP spikes   . Finasteride     diarrhea  . Flomax  [Tamsulosin Hcl]     Rash  . Rapaflo [Silodosin]     rash  . Sulfonamide Derivatives     REACTION: as child    Current Outpatient Medications  Medication Sig Dispense Refill  . amLODipine (NORVASC) 10 MG tablet TAKE 1 TABLET BY MOUTH DAILY 90 tablet 3  . Melatonin 10 MG TABS Take 20 mg by mouth at bedtime as needed.    . triamcinolone cream (KENALOG) 0.1 % APPLY TO AFFECTED AREAS ON LOWER LEG EVERY DAY AS NEEDED FOR ITCHING 30 g 1   No current facility-administered medications for this visit.    OBJECTIVE: Vitals:   02/09/19 1039  BP: 135/83  Pulse: 83  Resp: 20  Temp: 97.8 F (36.6 C)  SpO2: 100%     Body mass index is 24.12 kg/m.    ECOG FS:0 - Asymptomatic  General: Well-developed, well-nourished, no acute distress. Eyes: Pink conjunctiva, anicteric sclera. HEENT: Normocephalic, moist mucous membranes. Lungs: No audible wheezing or coughing. Heart: Regular rate and rhythm. Abdomen: Soft, nontender, no obvious distention. Musculoskeletal: No edema, cyanosis, or clubbing. Neuro: Alert, answering all questions appropriately. Cranial nerves grossly intact. Skin: Rash noted on torso and upper extremities. Psych: Normal affect.   LAB RESULTS:  Lab Results  Component Value Date   NA 138 01/11/2019   K 4.3 01/11/2019   CL 105 01/11/2019   CO2 25 01/11/2019   GLUCOSE 117 (H) 01/11/2019   BUN 20 01/11/2019   CREATININE 0.93 01/11/2019   CALCIUM 9.3 01/11/2019   PROT 6.2 01/11/2019   ALBUMIN 3.4 (L) 11/15/2018   AST 16 01/11/2019   ALT 16 01/11/2019   ALKPHOS 48 11/15/2018   BILITOT 0.6 01/11/2019   GFRNONAA >60 11/15/2018   GFRAA >60 11/15/2018    Lab Results  Component Value Date   WBC 5.4 01/19/2019   NEUTROABS 3,859 01/11/2019   HGB 12.1 (L) 01/19/2019   HCT 37.8 (L) 01/19/2019   MCV 95.2 01/19/2019   PLT 316 01/19/2019   Lab Results  Component Value Date   IRON 128 01/19/2019   TIBC 353 01/19/2019   IRONPCTSAT 36 01/19/2019   Lab Results    Component Value Date   FERRITIN 222 01/19/2019     STUDIES: No results found.  ASSESSMENT: Anemia unspecified, rash.  PLAN:    1.  Anemia: Mild.  Hemoglobin is only 12.1.  Iron stores, folate, and hemolysis labs are all negative or within normal limits.  Reticulocyte count is appropriately normal.  Despite patient's extensive rash, his sed rate is only mildly elevated at 21.  ANA, SPEP, and peripheral blood flow cytometry are all also negative or  within normal limits.  B12 levels are mildly decreased and have recommended oral B12 supplementation.   2.  Rash: Unclear etiology.  Patient states he has been evaluated by 2 separate dermatologist and has had biopsies that were unrevealing.  Patient also reports he has been on dexamethasone as well as doxycycline with minimal improvement of his rash.  Given his recent history of unintentional weight loss, will get CT of chest, abdomen, and pelvis to assess for underlying malignancy to ensure rashes not paraneoplastic.  If CT scan is negative, will refer patient back to dermatology.  If positive, patient will return to clinic 1 to 2 days after the scan to discuss the results. 3.  History of lung cancer: Patient had lobectomy in 1989 and did not require adjuvant chemo or XRT.    I spent a total of 20 minutes reviewing chart data, face-to-face evaluation with the patient, counseling and coordination of care as detailed above.   Patient expressed understanding and was in agreement with this plan. He also understands that He can call clinic at any time with any questions, concerns, or complaints.    Lloyd Huger, MD   02/09/2019 11:34 AM   Addendum: CT scan results from February 21, 2019 reviewed independently with no obvious evidence of malignancy. Although the etiology of patient's rash remains unclear, there is no evidence to suggest malignancy therefore this is not a paraneoplastic process. Will refer back to dermatology for continued  evaluation and treatment.   Lloyd Huger, MD 02/24/19 9:33 AM

## 2019-02-08 ENCOUNTER — Other Ambulatory Visit: Payer: Self-pay

## 2019-02-08 ENCOUNTER — Encounter: Payer: Self-pay | Admitting: Oncology

## 2019-02-09 ENCOUNTER — Other Ambulatory Visit: Payer: Self-pay

## 2019-02-09 ENCOUNTER — Inpatient Hospital Stay (HOSPITAL_BASED_OUTPATIENT_CLINIC_OR_DEPARTMENT_OTHER): Payer: Medicare Other | Admitting: Oncology

## 2019-02-09 VITALS — BP 135/83 | HR 83 | Temp 97.8°F | Resp 20 | Wt 163.3 lb

## 2019-02-09 DIAGNOSIS — I10 Essential (primary) hypertension: Secondary | ICD-10-CM | POA: Diagnosis not present

## 2019-02-09 DIAGNOSIS — Z87891 Personal history of nicotine dependence: Secondary | ICD-10-CM | POA: Diagnosis not present

## 2019-02-09 DIAGNOSIS — E785 Hyperlipidemia, unspecified: Secondary | ICD-10-CM | POA: Diagnosis not present

## 2019-02-09 DIAGNOSIS — D649 Anemia, unspecified: Secondary | ICD-10-CM | POA: Diagnosis not present

## 2019-02-09 DIAGNOSIS — R634 Abnormal weight loss: Secondary | ICD-10-CM | POA: Diagnosis not present

## 2019-02-09 DIAGNOSIS — C801 Malignant (primary) neoplasm, unspecified: Secondary | ICD-10-CM

## 2019-02-09 DIAGNOSIS — R21 Rash and other nonspecific skin eruption: Secondary | ICD-10-CM

## 2019-02-09 DIAGNOSIS — Z85118 Personal history of other malignant neoplasm of bronchus and lung: Secondary | ICD-10-CM | POA: Diagnosis not present

## 2019-02-21 ENCOUNTER — Ambulatory Visit
Admission: RE | Admit: 2019-02-21 | Discharge: 2019-02-21 | Disposition: A | Discharge status: Home / Self Care | Payer: Medicare Other | Source: Ambulatory Visit | Attending: Oncology | Admitting: Oncology

## 2019-02-21 ENCOUNTER — Other Ambulatory Visit: Payer: Self-pay

## 2019-02-21 DIAGNOSIS — N2889 Other specified disorders of kidney and ureter: Secondary | ICD-10-CM | POA: Diagnosis not present

## 2019-02-21 DIAGNOSIS — R918 Other nonspecific abnormal finding of lung field: Secondary | ICD-10-CM | POA: Diagnosis not present

## 2019-02-21 DIAGNOSIS — C801 Malignant (primary) neoplasm, unspecified: Secondary | ICD-10-CM

## 2019-02-21 DIAGNOSIS — R634 Abnormal weight loss: Secondary | ICD-10-CM | POA: Diagnosis not present

## 2019-02-21 DIAGNOSIS — R21 Rash and other nonspecific skin eruption: Secondary | ICD-10-CM | POA: Diagnosis not present

## 2019-02-21 LAB — POCT I-STAT CREATININE: Creatinine, Ser: 0.9 mg/dL (ref 0.61–1.24)

## 2019-02-21 MED ORDER — IOHEXOL 300 MG/ML  SOLN
100.0000 mL | Freq: Once | INTRAMUSCULAR | Status: AC | PRN
  Administered 2019-02-21: 100 mL via INTRAVENOUS

## 2019-02-26 ENCOUNTER — Telehealth: Payer: Self-pay | Admitting: *Deleted

## 2019-02-26 ENCOUNTER — Telehealth: Payer: Self-pay | Admitting: Emergency Medicine

## 2019-02-26 ENCOUNTER — Other Ambulatory Visit: Payer: Self-pay | Admitting: Emergency Medicine

## 2019-02-26 DIAGNOSIS — R21 Rash and other nonspecific skin eruption: Secondary | ICD-10-CM

## 2019-02-26 NOTE — Telephone Encounter (Signed)
Called patient to let him know that his CT did not show any malignancy, and that we referred him back to Dr. Evorn Gong as his rash did not appear to be a oncologic issue. Pt expressed concern that Dr. Evorn Gong had referred him to Dr. Grayland Ormond because Dr. Evorn Gong couldn't figure out the problem. I explained to the patient that Dr. Gary Fleet last office note had been included with the referral back, so hopefully that should help. Pt verbalized understanding and didn't have any further questions or concerns.

## 2019-02-26 NOTE — Telephone Encounter (Signed)
Erroneous encounter

## 2019-02-26 NOTE — Telephone Encounter (Signed)
I called him.

## 2019-02-26 NOTE — Telephone Encounter (Signed)
Patient called reporting that he got a call from Dr Jenel Lucks office that he was referred back to him and that he hasn't even heard results from his CT scan. He requests a return call to go over his results and why he was sent back to Dr Evorn Gong. [phone].  CLINICAL DATA:  Skin rash, possible malignancy.  EXAM: CT CHEST, ABDOMEN, AND PELVIS WITH CONTRAST  TECHNIQUE: Multidetector CT imaging of the chest, abdomen and pelvis was performed following the standard protocol during bolus administration of intravenous contrast.  CONTRAST:  17mL OMNIPAQUE IOHEXOL 300 MG/ML  SOLN  COMPARISON:  CT abdomen pelvis 07/04/2006.  FINDINGS: CT CHEST FINDINGS  Cardiovascular: Atherosclerotic calcification of the aorta and coronary arteries. Heart size normal. No pericardial effusion.  Mediastinum/Nodes: 8 mm low-attenuation right thyroid nodule. No followup recommended (ref: J Am Coll Radiol. 2015 Feb;12(2): 143-50).No pathologically enlarged mediastinal, hilar or axillary lymph nodes. There are surgical clips in the mediastinum. Esophagus is grossly unremarkable.  Lungs/Pleura: Biapical pleuroparenchymal scarring, right greater than left. Centrilobular and paraseptal emphysema. Right upper lobectomy. A few scattered tiny peripheral pulmonary nodules measure up to 3 mm (3/65). No pleural fluid. Airway is otherwise unremarkable.  Musculoskeletal: No worrisome lytic or sclerotic lesions.  CT ABDOMEN PELVIS FINDINGS  Hepatobiliary: Low-attenuation lesions in the liver measure up to 4.0 cm in the right hepatic lobe and are indicative of cysts. Liver and gallbladder are otherwise unremarkable. No biliary ductal dilatation.  Pancreas: Negative.  Spleen: Negative.  Adrenals/Urinary Tract: Adrenal glands are unremarkable. Low-attenuation lesion off the lower pole left kidney measures 3.4 cm and is likely a cyst. Other low-density lesions in the kidneys bilaterally are too  small to definitively characterize but statistically, cysts are likely. Ureters are decompressed. Bladder appears thick-walled but is low in volume.  Stomach/Bowel: Stomach, small bowel, appendix and colon are unremarkable.  Vascular/Lymphatic: Atherosclerotic calcification of the aorta without aneurysm. No pathologically enlarged lymph nodes.  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  Other: No free fluid.  Mesenteries and peritoneum are unremarkable.  Musculoskeletal: Degenerative changes in the spine. No worrisome lytic lesions.  IMPRESSION: 1. No evidence of primary malignancy in the chest, abdomen or pelvis. 2. Scattered peripheral pulmonary nodules measure 3 mm or less in size. No follow-up needed if patient is low-risk (and has no known or suspected primary neoplasm). Non-contrast chest CT can be considered in 12 months if patient is high-risk. This recommendation follows the consensus statement: Guidelines for Management of Incidental Pulmonary Nodules Detected on CT Images: From the Fleischner Society 2017; Radiology 2017; 284:228-243. 3. Aortic atherosclerosis (ICD10-I70.0). Coronary artery calcification. 4.  Emphysema (ICD10-J43.9).   Electronically Signed   By: Lorin Picket M.D.   On: 02/21/2019 14:43

## 2019-02-27 DIAGNOSIS — T490X5A Adverse effect of local antifungal, anti-infective and anti-inflammatory drugs, initial encounter: Secondary | ICD-10-CM | POA: Diagnosis not present

## 2019-02-27 DIAGNOSIS — L309 Dermatitis, unspecified: Secondary | ICD-10-CM | POA: Diagnosis not present

## 2019-02-28 ENCOUNTER — Telehealth: Payer: Self-pay | Admitting: *Deleted

## 2019-02-28 MED ORDER — LISINOPRIL 10 MG PO TABS: 10.0000 mg | ORAL_TABLET | Freq: Every day | ORAL | 1 refills | Status: DC

## 2019-02-28 NOTE — Telephone Encounter (Signed)
Patient left a voicemail stating that he has been seeing Dr. Evorn Gong because of a skin condition. Patient stated that Dr. Evorn Gong thinks that his skin problem may be coming from Norvasc. Patient stated that Dr. Evorn Gong wants to know if Dr. Damita Dunnings can change the Norvasc to another medication? [facility]

## 2019-02-28 NOTE — Telephone Encounter (Signed)
Their are several options here.  I will have him stop Norvasc and change to lisinopril.  It is possible to develop a cough when on lisinopril.  If he notices that then please let me know.  I would start taking lisinopril 10 mg a day.  See if he can check his blood pressure at home.  If his blood pressure is persistently above 140/above 90 after 5 days of taking lisinopril then I would increase to 20 mg.  Please update me about 10 days after the change, sooner if needed.  Prescription sent.  Med list updated.  Thanks.

## 2019-03-01 DIAGNOSIS — L309 Dermatitis, unspecified: Secondary | ICD-10-CM | POA: Diagnosis not present

## 2019-03-01 NOTE — Telephone Encounter (Signed)
Advised pt of change in medication. Stop norvasc and start lisinopril. Pt wrote instructions and readback for confirmation. Pt will f/u in 10 days or sooner if any problems. Pt verbalized understanding.

## 2019-03-12 MED ORDER — LISINOPRIL 10 MG PO TABS: 10.0000 mg | ORAL_TABLET | Freq: Every day | ORAL | Status: DC

## 2019-03-12 NOTE — Telephone Encounter (Signed)
I spoke with pt;pt said everything is doing fine;BP is consistently below 140/90 with last wk since changing to Lisinopril 10 mg daily and average BP is 130's/83 P 65. Pt is taking the Lisinopril 10 mg daily and pt did not have to increase to Lisinopril 20 mg. Pt is not having any cough yet. Pt said on and off has some difficulty breathing for 15' on and off but pt thinks this is related to COPD. Pt will cb or contact pulmonology if needed. Pt does not think he needs any further med changes and is doing fine. FYI to Dr Damita Dunnings.

## 2019-03-12 NOTE — Telephone Encounter (Signed)
Noted. Thanks.  Med list updated with 10mg /day.

## 2019-03-12 NOTE — Addendum Note (Signed)
Addended by: Tonia Ghent on: 03/12/2019 01:47 PM   Modules accepted: Orders

## 2019-03-15 ENCOUNTER — Ambulatory Visit: Payer: Medicare Other | Attending: Internal Medicine

## 2019-03-15 ENCOUNTER — Other Ambulatory Visit: Payer: Self-pay

## 2019-03-15 DIAGNOSIS — Z23 Encounter for immunization: Secondary | ICD-10-CM | POA: Insufficient documentation

## 2019-03-15 NOTE — Progress Notes (Signed)
   Covid-19 Vaccination Clinic  Name:  Brett Mcguire    MRN: [id number] DOB: [birth year]  03/15/2019  Mr. Brett Mcguire was observed post Covid-19 immunization for 15 minutes without incident. He was provided with Vaccine Information Sheet and instruction to access the V-Safe system.   Mr. Brett Mcguire was instructed to call 911 with any severe reactions post vaccine: Marland Kitchen Difficulty breathing  . Swelling of face and throat  . A fast heartbeat  . A bad rash all over body  . Dizziness and weakness   Immunizations Administered    Name Date Dose VIS Date Route   Pfizer COVID-19 Vaccine 03/15/2019 11:10 AM 0.3 mL 12/22/2018 Intramuscular   Manufacturer: [place]   Lot: [id number]   [place]: [address]-8

## 2019-03-29 DIAGNOSIS — N401 Enlarged prostate with lower urinary tract symptoms: Secondary | ICD-10-CM | POA: Diagnosis not present

## 2019-03-29 DIAGNOSIS — R972 Elevated prostate specific antigen [PSA]: Secondary | ICD-10-CM | POA: Diagnosis not present

## 2019-04-04 DIAGNOSIS — Z79899 Other long term (current) drug therapy: Secondary | ICD-10-CM | POA: Diagnosis not present

## 2019-04-04 DIAGNOSIS — L538 Other specified erythematous conditions: Secondary | ICD-10-CM | POA: Diagnosis not present

## 2019-04-04 DIAGNOSIS — L309 Dermatitis, unspecified: Secondary | ICD-10-CM | POA: Diagnosis not present

## 2019-04-04 DIAGNOSIS — L820 Inflamed seborrheic keratosis: Secondary | ICD-10-CM | POA: Diagnosis not present

## 2019-04-05 ENCOUNTER — Ambulatory Visit: Payer: Medicare Other | Attending: Internal Medicine

## 2019-04-05 DIAGNOSIS — Z23 Encounter for immunization: Secondary | ICD-10-CM

## 2019-04-05 NOTE — Progress Notes (Signed)
   Covid-19 Vaccination Clinic  Name:  Brett Mcguire    MRN: [id number] DOB: Dec 20, 1945  04/05/2019  [name] was observed post Covid-19 immunization for 15 minutes without incident. He was provided with Vaccine Information Sheet and instruction to access the V-Safe system.   [name] was instructed to call 911 with any severe reactions post vaccine: Marland Kitchen Difficulty breathing  . Swelling of face and throat  . A fast heartbeat  . A bad rash all over body  . Dizziness and weakness   Immunizations Administered    Name Date Dose VIS Date Route   Pfizer COVID-19 Vaccine 04/05/2019 11:06 AM 0.3 mL 12/22/2018 Intramuscular   Manufacturer: [employer], [employer]   Lot: [id number]   [place]: [address]-6

## 2019-04-06 DIAGNOSIS — L309 Dermatitis, unspecified: Secondary | ICD-10-CM | POA: Diagnosis not present

## 2019-04-06 DIAGNOSIS — R972 Elevated prostate specific antigen [PSA]: Secondary | ICD-10-CM | POA: Diagnosis not present

## 2019-04-06 DIAGNOSIS — N401 Enlarged prostate with lower urinary tract symptoms: Secondary | ICD-10-CM | POA: Diagnosis not present

## 2019-04-17 DIAGNOSIS — N401 Enlarged prostate with lower urinary tract symptoms: Secondary | ICD-10-CM | POA: Diagnosis not present

## 2019-04-17 DIAGNOSIS — D400 Neoplasm of uncertain behavior of prostate: Secondary | ICD-10-CM | POA: Diagnosis not present

## 2019-04-17 DIAGNOSIS — R972 Elevated prostate specific antigen [PSA]: Secondary | ICD-10-CM | POA: Diagnosis not present

## 2019-04-17 DIAGNOSIS — R351 Nocturia: Secondary | ICD-10-CM | POA: Diagnosis not present

## 2019-05-04 DIAGNOSIS — L309 Dermatitis, unspecified: Secondary | ICD-10-CM | POA: Diagnosis not present

## 2019-05-28 DIAGNOSIS — Z79899 Other long term (current) drug therapy: Secondary | ICD-10-CM | POA: Diagnosis not present

## 2019-06-05 DIAGNOSIS — H25813 Combined forms of age-related cataract, bilateral: Secondary | ICD-10-CM | POA: Diagnosis not present

## 2019-07-21 NOTE — Progress Notes (Signed)
Cardiology Office Note  Date:  07/23/2019   ID:  Brett Mcguire, DOB 06/03/1945, MRN [id number]  PCP:  Tonia Ghent, MD   Chief Complaint  Patient presents with  . Other    12 month follow up. Meds reviewed verbally with patient.     HPI:  Mr. Krammes is a very pleasant 74 year old gentleman with prior history of  smoking for 17 years,  17 years of secondhand exposure per the patient,  lung cancer with resection of the right upper lobe in 1989,  quit smoking in 1984,  chronic mild shortness of breath   who presents for follow-up of his aortic valve disease, aortic valve stenosis.  Last seen in the clinic July 2019 Last echocardiogram May 2019 Last visit denied having shortness of breath, did have chronic fatigue,  runs a dog hotel  Echo 05/2017 reviewed with him in detail  left ventricle:   LVH.  ejection fraction was in the range of 60  to 65%.  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 Transvalvular velocity was increased. There   was moderate to severe stenosis. Mean gradient (S): 27 mm Hg. Peak gradient 45 mm Hg, peak velocity 335 cm/s  Last year, developed rash, had this for 11 months On methotrexate, for skin, slowly getting better Did bx, etiology unclear  Feels his breathing is not as good as before on last clinic visit Not jogging with the dogs anymore More SOB with exertion, concerned about his valve  Cough on lisinopril, notably at nighttime after he takes the pill  EKG personally reviewed by myself on todays visit NSR rate 69 bpm no ST or T wave changes  Prior records reviewed Echo in 12/2014 Possibly bicuspid; moderately thickened, mildly calcified leaflets. There was mild to moderate stenosis. There was trivial regurgitation. Peak velocity 311, Peak gradient (S): 39 mm Hg.  Prior severe episode of vertigo one month ago lasting for several minutes No recurrence    PMH:   has a past medical history of  Allergy (1989), Aortic stenosis, BCC (basal cell carcinoma of skin), Diverticulosis (02/2000), Hyperlipidemia (1989), Hypertension, Lung cancer ([place]) (1989), Melanoma of skin ([place]), and Murmur.  PSH:    Past Surgical History:  Procedure Laterality Date  . COLONOSCOPY  02/18/2000   Polyp, diverticulosis, repeat in 5 years  . COLONOSCOPY  03/03/2006   Repeat  -  Divertics  . COLONOSCOPY WITH PROPOFOL N/A 12/22/2017   Procedure: COLONOSCOPY WITH PROPOFOL;  Surgeon: Virgel Manifold, MD;  Location: [facility] ENDOSCOPY;  Service: Endoscopy;  Laterality: N/A;  . CYSTOSCOPY WITH INSERTION OF UROLIFT    . Laminectomy/Discectomy  11/03/2005   L5/S1  . Malignant melanoma excision  06/20/2003   Dr. Nehemiah Massed  . MRI  10/09/2005   Lumbar spine, bulging disc L5/S1  . Right lobectomy  1989   Lung cancer  . TONSILLECTOMY AND ADENOIDECTOMY      Current Outpatient Medications  Medication Sig Dispense Refill  . lisinopril (ZESTRIL) 10 MG tablet Take 1 tablet (10 mg total) by mouth daily.    . Melatonin 10 MG TABS Take 20 mg by mouth at bedtime as needed.    . triamcinolone cream (KENALOG) 0.1 % APPLY TO AFFECTED AREAS ON LOWER LEG EVERY DAY AS NEEDED FOR ITCHING 30 g 1   No current facility-administered medications for this visit.    Allergies:   Alfuzosin, Cialis [tadalafil], Doxazosin, Ephedrine, Finasteride, Flomax [tamsulosin hcl], Rapaflo [silodosin], and Sulfonamide derivatives  Social History:  The patient  reports that he quit smoking about 37 years ago. His smoking use included cigarettes. He has a 34.00 pack-year smoking history. He has never used smokeless tobacco. He reports current alcohol use of about 10.0 standard drinks of alcohol per week. He reports that he does not use drugs.   Family History:   family history includes Alcohol abuse in his mother; Cancer in his father, mother, and another family member; Prostate cancer in his father.    Review of Systems: Review of Systems   Constitutional: Negative.   Respiratory: Positive for shortness of breath.   Cardiovascular: Negative.   Gastrointestinal: Negative.   Musculoskeletal: Negative.   Neurological: Negative.   Psychiatric/Behavioral: Negative.   All other systems reviewed and are negative.   PHYSICAL EXAM: VS:  BP 138/74 (BP Location: Left Arm, Patient Position: Sitting, Cuff Size: Normal)   Pulse 69   Ht 5' 8.5" (1.74 m)   Wt 165 lb (74.8 kg)   SpO2 97%   BMI 24.72 kg/m  , BMI Body mass index is 24.72 kg/m. Constitutional:  oriented to person, place, and time. No distress.  HENT:  Head: Grossly normal Eyes:  no discharge. No scleral icterus.  Neck: No JVD, no carotid bruits  Cardiovascular: Regular rate and rhythm, 3/6 SEM RSB Pulmonary/Chest: Clear to auscultation bilaterally, no wheezes or rails Abdominal: Soft.  no distension.  no tenderness.  Musculoskeletal: Normal range of motion Neurological:  normal muscle tone. Coordination normal. No atrophy Skin: Skin warm and dry Psychiatric: normal affect, pleasant  Recent Labs: 01/11/2019: ALT 16; BUN 20; Potassium 4.3; Sodium 138; TSH 2.09 01/19/2019: Hemoglobin 12.1; Platelets 316 02/21/2019: Creatinine, Ser 0.90    Lipid Panel Lab Results  Component Value Date   CHOL 177 06/06/2018   HDL 53.40 06/06/2018   LDLCALC 95 06/06/2018   TRIG 142.0 06/06/2018      Wt Readings from Last 3 Encounters:  07/23/19 165 lb (74.8 kg)  02/09/19 163 lb 4.8 oz (74.1 kg)  01/19/19 164 lb 14.4 oz (74.8 kg)      ASSESSMENT AND PLAN:  Aortic valve disease - Plan: EKG 12-Lead Moderate aortic valve stenosis in 2016 echo May 2019, moderate to severe Worsening shortness of breath on today's visit, echocardiogram has been ordered to evaluate aortic valve stenosis  Essential hypertension - Plan: EKG 12-Lead Change lisinopril to losartan given cough, start 25 mg daily  Chronic fatigue Managed by primary care  Mixed hyperlipidemia - Plan: EKG 12-Lead  does not want a cholesterol medication Discussed  Carotid arterial stenosis Mild bilateral disease on study in 2017, less than 39% No bruits on exam  Shortness of breath Long history of smoking, prior lung cancer Echocardiogram ordered to evaluate aortic valve stenosis  Disposition:   F/U  12 months   Total encounter time more than 25 minutes  Greater than 50% was spent in counseling and coordination of care with the patient    No orders of the defined types were placed in this encounter.    Signed, Esmond Plants, M.D., Ph.D. 07/23/2019  [place], [place]

## 2019-07-23 ENCOUNTER — Encounter: Payer: Self-pay | Admitting: Cardiovascular Disease

## 2019-07-23 ENCOUNTER — Other Ambulatory Visit: Payer: Self-pay

## 2019-07-23 ENCOUNTER — Ambulatory Visit (INDEPENDENT_AMBULATORY_CARE_PROVIDER_SITE_OTHER): Payer: Medicare Other | Admitting: Cardiovascular Disease

## 2019-07-23 VITALS — BP 138/74 | HR 69 | Ht 68.5 in | Wt 165.0 lb

## 2019-07-23 DIAGNOSIS — I10 Essential (primary) hypertension: Secondary | ICD-10-CM | POA: Diagnosis not present

## 2019-07-23 DIAGNOSIS — E782 Mixed hyperlipidemia: Secondary | ICD-10-CM | POA: Diagnosis not present

## 2019-07-23 DIAGNOSIS — I350 Nonrheumatic aortic (valve) stenosis: Secondary | ICD-10-CM

## 2019-07-23 MED ORDER — LOSARTAN POTASSIUM 25 MG PO TABS: 25.0000 mg | ORAL_TABLET | Freq: Every day | ORAL | 3 refills | Status: DC

## 2019-07-23 NOTE — Patient Instructions (Addendum)
Medication Instructions:  Your physician has recommended you make the following change in your medication:  1. STOP Lisinopril 2. START Losartan 25 mg once daily   If you need a refill on your cardiac medications before your next appointment, please call your pharmacy.    Lab work: No new labs needed   If you have labs (blood work) drawn today and your tests are completely normal, you will receive your results only by: Marland Kitchen MyChart Message (if you have MyChart) OR . A paper copy in the mail If you have any lab test that is abnormal or we need to change your treatment, we will call you to review the results.   Testing/Procedures: Echo for aortic valve stenosis Your physician has requested that you have an echocardiogram. Echocardiography is a painless test that uses sound waves to create images of your heart. It provides your doctor with information about the size and shape of your heart and how well your heart's chambers and valves are working. This procedure takes approximately one hour. There are no restrictions for this procedure.     Follow-Up: At [facility], you and your health needs are our priority.  As part of our continuing mission to provide you with exceptional heart care, we have created designated Provider Care Teams.  These Care Teams include your primary Cardiologist (physician) and Advanced Practice Providers (APPs -  Physician Assistants and Nurse Practitioners) who all work together to provide you with the care you need, when you need it.  . You will need a follow up appointment in 12 months   . Providers on your designated Care Team:   . Murray Hodgkins, NP . Christell Faith, PA-C . Marrianne Mood, PA-C  Any Other Special Instructions Will Be Listed Below (If Applicable).  COVID-19 Vaccine Information can be found at: ShippingScam.co.uk For questions related to vaccine distribution or appointments,  please email [email or web address] or call [phone].

## 2019-08-06 ENCOUNTER — Telehealth: Payer: Self-pay | Admitting: Cardiovascular Disease

## 2019-08-06 NOTE — Telephone Encounter (Signed)
Patient calling in regarding medication, losartan. Patient states he was told by Dr. [name] he would set up for a company to deliver this medication to this patient for no cost. Patient has not heard anything regarding this.  On 7/12 it looks like this medication was sent to [facility] with a confirmed receipt.      Please advise patient

## 2019-08-06 NOTE — Telephone Encounter (Signed)
To Dr. Donivan Scull nurse to review on her return to clinic as I am not certain as to what the patient is referring to.

## 2019-08-09 NOTE — Telephone Encounter (Signed)
Spoke with patients wife per release form. Reviewed that [facility] needs him to call and set up account with them and provide credit card to keep on file. Also reviewed that it would not cost anything for the losartan and provided her with phone number and information that they will need. She verbalized understanding of our conversation, agreement with plan, and had no further questions at this time.

## 2019-08-09 NOTE — Telephone Encounter (Signed)
Pharmacy states that patient needs to call in to set up account and provide with credit card on file. His prescription will be $0.00 once he calls to get that taken care of. She provided number for him to call [phone] and they are open 24/7. Will reach back out to patients wife and provide her with this information.

## 2019-08-09 NOTE — Telephone Encounter (Signed)
Spoke with patients wife to see what is needed. She states that they have not received medication at this time. Offered to call pharmacy to check on the status of this medication.

## 2019-08-22 ENCOUNTER — Ambulatory Visit (INDEPENDENT_AMBULATORY_CARE_PROVIDER_SITE_OTHER): Payer: Medicare Other

## 2019-08-22 ENCOUNTER — Other Ambulatory Visit: Payer: Self-pay

## 2019-08-22 DIAGNOSIS — I350 Nonrheumatic aortic (valve) stenosis: Secondary | ICD-10-CM

## 2019-08-22 LAB — ECHOCARDIOGRAM COMPLETE
AR max vel: 2.55 cm2
AV Area VTI: 2.98 cm2
AV Area mean vel: 2.82 cm2
AV Mean grad: 43.3 mmHg
AV Peak grad: 82.1 mmHg
Ao pk vel: 4.53 m/s
Area-P 1/2: 1.82 cm2
Calc EF: 55.3 %
S' Lateral: 2.5 cm
Single Plane A2C EF: 56.3 %
Single Plane A4C EF: 53.8 %

## 2019-08-29 ENCOUNTER — Telehealth: Payer: Self-pay | Admitting: *Deleted

## 2019-08-29 NOTE — Telephone Encounter (Signed)
-----   Message from Minna Merritts, MD sent at 08/28/2019  2:04 PM EDT ----- Echocardiogram Worsening of his aortic valve stenosis now severe Based on numbers, something needs to be done to fix the valve, gradient very high Would he like to meet in clinic and talk about the next step We will need cardiac catheterization (results will determine how we fix the valve) This can also be scheduled without a clinic visit but we need lab work

## 2019-08-29 NOTE — Telephone Encounter (Signed)
Spoke with patient about his echocardiogram results and further recommendations by provider. Scheduled him to come in and see Brett Bayley NP this Friday due to no appointments with Dr. [name]. He was agreeable with this and had no further questions at this time.

## 2019-08-31 ENCOUNTER — Other Ambulatory Visit: Payer: Self-pay

## 2019-08-31 ENCOUNTER — Encounter: Payer: Self-pay | Admitting: Nurse Practitioner

## 2019-08-31 ENCOUNTER — Telehealth: Payer: Self-pay | Admitting: Nurse Practitioner

## 2019-08-31 ENCOUNTER — Ambulatory Visit (INDEPENDENT_AMBULATORY_CARE_PROVIDER_SITE_OTHER): Payer: Medicare Other | Admitting: Nurse Practitioner

## 2019-08-31 VITALS — BP 136/70 | HR 70 | Ht 68.5 in | Wt 162.2 lb

## 2019-08-31 DIAGNOSIS — I10 Essential (primary) hypertension: Secondary | ICD-10-CM

## 2019-08-31 DIAGNOSIS — I350 Nonrheumatic aortic (valve) stenosis: Secondary | ICD-10-CM

## 2019-08-31 NOTE — Telephone Encounter (Signed)
Patient calling for Brett Mcguire States that next Thursday for cath would work for him  Please call to discuss/schedule Once completed, scheduling will schedule 2 week follow up

## 2019-08-31 NOTE — Patient Instructions (Addendum)
Medication Instructions:  Your physician recommends that you continue on your current medications as directed. Please refer to the Current Medication list given to you today.  *If you need a refill on your cardiac medications before your next appointment, please call your pharmacy*   Lab Work: You will need lab work and a COVID test prior to the procedure.  If you have labs (blood work) drawn today and your tests are completely normal, you will receive your results only by: Marland Kitchen MyChart Message (if you have MyChart) OR . A paper copy in the mail If you have any lab test that is abnormal or we need to change your treatment, we will call you to review the results.   Testing/Procedures: Your physician has requested that you have a cardiac catheterization. Cardiac catheterization is used to diagnose and/or treat various heart conditions. Doctors may recommend this procedure for a number of different reasons. The most common reason is to evaluate chest pain. Chest pain can be a symptom of coronary artery disease (CAD), and cardiac catheterization can show whether plaque is narrowing or blocking your heart's arteries. This procedure is also used to evaluate the valves, as well as measure the blood flow and oxygen levels in different parts of your heart. For further information please visit [email or web address].[email or web address]. Please follow instruction sheet, as given.     Follow-Up: At [facility], you and your health needs are our priority.  As part of our continuing mission to provide you with exceptional heart care, we have created designated Provider Care Teams.  These Care Teams include your primary Cardiologist (physician) and Advanced Practice Providers (APPs -  Physician Assistants and Nurse Practitioners) who all work together to provide you with the care you need, when you need it.  We recommend signing up for the patient portal called "MyChart".  Sign up information is provided on this After Visit  Summary.  MyChart is used to connect with patients for Virtual Visits (Telemedicine).  Patients are able to view lab/test results, encounter notes, upcoming appointments, etc.  Non-urgent messages can be sent to your provider as well.   To learn more about what you can do with MyChart, go to [email or web address].    Your next appointment:   2 week(s) after the procedure  The format for your next appointment:   In Person  Provider:    You may see Ida Rogue, MD or one of the following Advanced Practice Providers on your designated Care Team:    Murray Hodgkins, NP  Christell Faith, PA-C  Marrianne Mood, PA-C    Other Instructions Call our office when you are ready to schedule the recommended cardiac cath

## 2019-08-31 NOTE — H&P (View-Only) (Signed)
Cardiology Clinic Note   Patient Name: Brett Mcguire Date of Encounter: 08/31/2019  Primary Care Provider:  Tonia Ghent, MD Primary Cardiologist:  Ida Rogue, MD  Patient Profile    74 year old male with a history of aortic stenosis, hypertension, hyperlipidemia, and lung cancer status post right upper lobectomy (1989), who presents for follow-up of aortic stenosis.  Past Medical History    Past Medical History:  Diagnosis Date  . Allergy 1989  . Aortic stenosis   . 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 Diverticulosis 02/2000  . Hyperlipidemia 1989  . Hypertension   . Lung cancer ([place]) 1989   s/p lobectomy, no chemo or rady  . Melanoma of skin ([place])    chest  . Murmur    aortic stenosis   Past Surgical History:  Procedure Laterality Date  . COLONOSCOPY  02/18/2000   Polyp, diverticulosis, repeat in 5 years  . COLONOSCOPY  03/03/2006   Repeat  -  Divertics  . COLONOSCOPY WITH PROPOFOL N/A 12/22/2017   Procedure: COLONOSCOPY WITH PROPOFOL;  Surgeon: Virgel Manifold, MD;  Location: [facility] ENDOSCOPY;  Service: Endoscopy;  Laterality: N/A;  . CYSTOSCOPY WITH INSERTION OF UROLIFT    . Laminectomy/Discectomy  11/03/2005   L5/S1  . Malignant melanoma excision  06/20/2003   Dr. Nehemiah Massed  . MRI  10/09/2005   Lumbar spine, bulging disc L5/S1  . Right lobectomy  1989   Lung cancer  . TONSILLECTOMY AND ADENOIDECTOMY      Allergies  Allergies  Allergen Reactions  . Alfuzosin     Skin rash, burning sensation of skin  . Cialis [Tadalafil] Other (See Comments)    heartburn  . Doxazosin     Skin rash, burning sensation of skin  . Ephedrine Other (See Comments)    BP spikes   . Finasteride     diarrhea  . Flomax [Tamsulosin Hcl]     Rash  . Rapaflo [Silodosin]     rash  . Sulfonamide Derivatives     REACTION: as child    History of Present Illness    74 year old male with a history of aortic stenosis, hypertension, hyperlipidemia,  and lung cancer status post right upper lobectomy (1989).  He has been followed by Dr. [name] in the setting of progressive aortic stenosis.  Echo in May 2019 showed an EF of 60-65% with a mean aortic valve gradient of 27 mmHg, peak gradient 45 mmHg, peak velocity of 335 cm/s.  At last clinic visit in July 2021, he complained of increasing dyspnea on exertion.  Repeat echocardiogram was performed last week and now shows an EF of 16-10%, grade 1 diastolic dysfunction, mean aortic valve gradient of 41 mmHg with a peak gradient of 66 mmHg, V-max of 407 cm/s, and aortic valve area of 1.2 cm (VTI)-all indicative of severe aortic stenosis.  Office follow-up was arranged to discuss diagnostic catheterization.  Since his last visit, he has continued to have dyspnea with minimal activity.  He denies chest pain, palpitations, PND, orthopnea, early satiety, presyncope, or syncope.  We discussed the role of diagnostic catheterization in the evaluation of aortic valve disease and probable replacement and he is willing to proceed.  All questions answered.  Home Medications    Prior to Admission medications   Medication Sig Start Date End Date Taking? Authorizing Provider  losartan (COZAAR) 25 MG tablet Take 1 tablet (25 mg total) by mouth daily. 07/23/19   Minna Merritts,  MD    Family History    Family History  Problem Relation Age of Onset  . Cancer Mother        [place] throat  . Alcohol abuse Mother   . Cancer Father        4 kinds; 5 places, prostate and lung  . Prostate cancer Father        possible prostate cancer  . Cancer Other        Skin, deceased in 01-Apr-1994  . Colon cancer Neg Hx        none known   He indicated that his mother is deceased. He indicated that his father is deceased. He indicated that his son is deceased. He indicated that the status of his neg hx is unknown. He indicated that one of his two others is deceased. He reported the following about his other of unknown status: Son  committed suicide / manic depressive/ auditory dyslexia.  Social History    Social History   Socioeconomic History  . Marital status: Married    Spouse name: Not on file  . Number of children: 2  . Years of education: Not on file  . Highest education level: Not on file  Occupational History  . 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 Smoking status: Former Smoker    Packs/day: 2.00    Years: 17.00    Pack years: 34.00    Types: Cigarettes    Quit date: 02/22/1982    Years since quitting: 37.5  . Smokeless tobacco: Never Used  Vaping Use  . Vaping Use: Never used  Substance and Sexual Activity  . Alcohol use: Yes    Alcohol/week: 10.0 standard drinks    Types: 10 Glasses of wine per week    Comment: wine  . Drug use: No  . Sexual activity: Yes  Other Topics Concern  . 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    Married 40+ years   Social Determinants of Radio broadcast assistant Strain:   . Difficulty of Paying Living Expenses: Not on file  Food Insecurity:   . Worried About Charity fundraiser in the Last Year: Not on file  . Ran Out of Food in the Last Year: Not on file  Transportation Needs:   . Lack of Transportation (Medical): Not on file  . Lack of Transportation (Non-Medical): Not on file  Physical Activity:   . Days of Exercise per Week: Not on file  . Minutes of Exercise per Session: Not on file  Stress:   . Feeling of Stress : Not on file  Social Connections:   . Frequency of Communication with Friends and Family: Not on file  . Frequency of Social Gatherings with Friends and Family: Not on file  . Attends Religious Services: Not on file  . Active Member of Clubs or Organizations: Not on file  . Attends Archivist Meetings: Not on file  . Marital Status: Not on file  Intimate Partner Violence:   .  Fear of Current or Ex-Partner: Not on file  . Emotionally Abused: Not on file  . Physically Abused: Not on file  . Sexually Abused: Not on file     Review of Systems    General:  No chills, fever, night  sweats or weight changes.  Cardiovascular:  No chest pain, +++  dyspnea on exertion, no edema, orthopnea, palpitations, paroxysmal nocturnal dyspnea. Dermatological: No rash, lesions/masses Respiratory: No cough, dyspnea Urologic: No hematuria, dysuria Abdominal:   No nausea, vomiting, diarrhea, bright red blood per rectum, melena, or hematemesis Neurologic:  No visual changes, wkns, changes in mental status. All other systems reviewed and are otherwise negative except as noted above.  Physical Exam    VS:  BP 136/70   Pulse 70   Ht 5' 8.5" (1.74 m)   Wt 162 lb 3.2 oz (73.6 kg)   SpO2 96%   BMI 24.30 kg/m  , BMI Body mass index is 24.3 kg/m. GEN: Well nourished, well developed, in no acute distress. HEENT: normal. Neck: Supple, no JVD, radiated systolic murmur right greater than left.   Cardiac: RRR, 3/6 SEM loudest at the upper sternal borders but heard throughout.  Murmur radiates to bilateral carotids.  No rubs, or gallops. No clubbing, cyanosis, edema.  Radials/PT 2+ and equal bilaterally.  Respiratory:  Respirations regular and unlabored, scattered rhonchi. GI: Soft, nontender, nondistended, BS + x 4. MS: no deformity or atrophy. Skin: warm and dry, no rash. Neuro:  Strength and sensation are intact. Psych: Normal affect.  Accessory Clinical Findings    ECG personally reviewed by me today-regular sinus rhythm, 70- No acute changes  Lab Results  Component Value Date   WBC 5.4 01/19/2019   HGB 12.1 (L) 01/19/2019   HCT 37.8 (L) 01/19/2019   MCV 95.2 01/19/2019   PLT 316 01/19/2019   Lab Results  Component Value Date   CREATININE 0.90 02/21/2019   BUN 20 01/11/2019   NA 138 01/11/2019   K 4.3 01/11/2019   CL 105 01/11/2019   CO2 25 01/11/2019   Lab Results    Component Value Date   ALT 16 01/11/2019   AST 16 01/11/2019   ALKPHOS 48 11/15/2018   BILITOT 0.6 01/11/2019   Lab Results  Component Value Date   CHOL 177 06/06/2018   HDL 53.40 06/06/2018   LDLCALC 95 06/06/2018   LDLDIRECT 120.1 02/07/2006   TRIG 142.0 06/06/2018   CHOLHDL 3 06/06/2018     Assessment & Plan   1.  Severe aortic stenosis: At last visit, patient reported increasing dyspnea on exertion and follow-up echo showed an EF of 27-78%, grade 1 diastolic dysfunction, mean aortic valve gradient of 41 mmHg with a peak gradient of 66 mmHg, V-max of 407 cm/s, and aortic valve area of 1.2 cm (VTI).  In that setting, arrangements were made for follow-up today to discuss diagnostic catheterization and future work-up.  The patient understands that risks include but are not limited to stroke (1 in 1000), death (1 in 78), kidney failure [usually temporary] (1 in 500), bleeding (1 in 200), allergic reaction [possibly serious] (1 in 200), and agrees to proceed.  He needs to check his work schedule but we hope to get this done within the next 2 weeks.    2.  Essential hypertension: Stable on ARB therapy.  3.  Disposition: Arrange for diagnostic catheterization after patient checks his schedule.  Follow-up dependent upon timing of scheduling of cath  and findings.  Murray Hodgkins, NP 08/31/2019, 1:13 PM

## 2019-08-31 NOTE — Progress Notes (Signed)
Cardiology Clinic Note   Patient Name: Brett Mcguire Date of Encounter: 08/31/2019  Primary Care Provider:  Tonia Ghent, MD Primary Cardiologist:  Ida Rogue, MD  Patient Profile    74 year old male with a history of aortic stenosis, hypertension, hyperlipidemia, and lung cancer status post right upper lobectomy (1989), who presents for follow-up of aortic stenosis.  Past Medical History    Past Medical History:  Diagnosis Date  . Allergy 1989  . Aortic stenosis   . 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 Diverticulosis 02/2000  . Hyperlipidemia 1989  . Hypertension   . Lung cancer ([place]) 1989   s/p lobectomy, no chemo or rady  . Melanoma of skin ([place])    chest  . Murmur    aortic stenosis   Past Surgical History:  Procedure Laterality Date  . COLONOSCOPY  02/18/2000   Polyp, diverticulosis, repeat in 5 years  . COLONOSCOPY  03/03/2006   Repeat  -  Divertics  . COLONOSCOPY WITH PROPOFOL N/A 12/22/2017   Procedure: COLONOSCOPY WITH PROPOFOL;  Surgeon: Virgel Manifold, MD;  Location: [facility] ENDOSCOPY;  Service: Endoscopy;  Laterality: N/A;  . CYSTOSCOPY WITH INSERTION OF UROLIFT    . Laminectomy/Discectomy  11/03/2005   L5/S1  . Malignant melanoma excision  06/20/2003   Dr. Nehemiah Massed  . MRI  10/09/2005   Lumbar spine, bulging disc L5/S1  . Right lobectomy  1989   Lung cancer  . TONSILLECTOMY AND ADENOIDECTOMY      Allergies  Allergies  Allergen Reactions  . Alfuzosin     Skin rash, burning sensation of skin  . Cialis [Tadalafil] Other (See Comments)    heartburn  . Doxazosin     Skin rash, burning sensation of skin  . Ephedrine Other (See Comments)    BP spikes   . Finasteride     diarrhea  . Flomax [Tamsulosin Hcl]     Rash  . Rapaflo [Silodosin]     rash  . Sulfonamide Derivatives     REACTION: as child    History of Present Illness    74 year old male with a history of aortic stenosis, hypertension, hyperlipidemia,  and lung cancer status post right upper lobectomy (1989).  He has been followed by Dr. [name] in the setting of progressive aortic stenosis.  Echo in May 2019 showed an EF of 60-65% with a mean aortic valve gradient of 27 mmHg, peak gradient 45 mmHg, peak velocity of 335 cm/s.  At last clinic visit in July 2021, he complained of increasing dyspnea on exertion.  Repeat echocardiogram was performed last week and now shows an EF of 95-63%, grade 1 diastolic dysfunction, mean aortic valve gradient of 41 mmHg with a peak gradient of 66 mmHg, V-max of 407 cm/s, and aortic valve area of 1.2 cm (VTI)-all indicative of severe aortic stenosis.  Office follow-up was arranged to discuss diagnostic catheterization.  Since his last visit, he has continued to have dyspnea with minimal activity.  He denies chest pain, palpitations, PND, orthopnea, early satiety, presyncope, or syncope.  We discussed the role of diagnostic catheterization in the evaluation of aortic valve disease and probable replacement and he is willing to proceed.  All questions answered.  Home Medications    Prior to Admission medications   Medication Sig Start Date End Date Taking? Authorizing Provider  losartan (COZAAR) 25 MG tablet Take 1 tablet (25 mg total) by mouth daily. 07/23/19   Minna Merritts,  MD    Family History    Family History  Problem Relation Age of Onset  . Cancer Mother        [place] throat  . Alcohol abuse Mother   . Cancer Father        4 kinds; 5 places, prostate and lung  . Prostate cancer Father        possible prostate cancer  . Cancer Other        Skin, deceased in Apr 04, 1994  . Colon cancer Neg Hx        none known   He indicated that his mother is deceased. He indicated that his father is deceased. He indicated that his son is deceased. He indicated that the status of his neg hx is unknown. He indicated that one of his two others is deceased. He reported the following about his other of unknown status: Son  committed suicide / manic depressive/ auditory dyslexia.  Social History    Social History   Socioeconomic History  . Marital status: Married    Spouse name: Not on file  . Number of children: 2  . Years of education: Not on file  . Highest education level: Not on file  Occupational History  . 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 Smoking status: Former Smoker    Packs/day: 2.00    Years: 17.00    Pack years: 34.00    Types: Cigarettes    Quit date: 02/22/1982    Years since quitting: 37.5  . Smokeless tobacco: Never Used  Vaping Use  . Vaping Use: Never used  Substance and Sexual Activity  . Alcohol use: Yes    Alcohol/week: 10.0 standard drinks    Types: 10 Glasses of wine per week    Comment: wine  . Drug use: No  . Sexual activity: Yes  Other Topics Concern  . 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    Married 40+ years   Social Determinants of Radio broadcast assistant Strain:   . Difficulty of Paying Living Expenses: Not on file  Food Insecurity:   . Worried About Charity fundraiser in the Last Year: Not on file  . Ran Out of Food in the Last Year: Not on file  Transportation Needs:   . Lack of Transportation (Medical): Not on file  . Lack of Transportation (Non-Medical): Not on file  Physical Activity:   . Days of Exercise per Week: Not on file  . Minutes of Exercise per Session: Not on file  Stress:   . Feeling of Stress : Not on file  Social Connections:   . Frequency of Communication with Friends and Family: Not on file  . Frequency of Social Gatherings with Friends and Family: Not on file  . Attends Religious Services: Not on file  . Active Member of Clubs or Organizations: Not on file  . Attends Archivist Meetings: Not on file  . Marital Status: Not on file  Intimate Partner Violence:   .  Fear of Current or Ex-Partner: Not on file  . Emotionally Abused: Not on file  . Physically Abused: Not on file  . Sexually Abused: Not on file     Review of Systems    General:  No chills, fever, night  sweats or weight changes.  Cardiovascular:  No chest pain, +++  dyspnea on exertion, no edema, orthopnea, palpitations, paroxysmal nocturnal dyspnea. Dermatological: No rash, lesions/masses Respiratory: No cough, dyspnea Urologic: No hematuria, dysuria Abdominal:   No nausea, vomiting, diarrhea, bright red blood per rectum, melena, or hematemesis Neurologic:  No visual changes, wkns, changes in mental status. All other systems reviewed and are otherwise negative except as noted above.  Physical Exam    VS:  BP 136/70   Pulse 70   Ht 5' 8.5" (1.74 m)   Wt 162 lb 3.2 oz (73.6 kg)   SpO2 96%   BMI 24.30 kg/m  , BMI Body mass index is 24.3 kg/m. GEN: Well nourished, well developed, in no acute distress. HEENT: normal. Neck: Supple, no JVD, radiated systolic murmur right greater than left.   Cardiac: RRR, 3/6 SEM loudest at the upper sternal borders but heard throughout.  Murmur radiates to bilateral carotids.  No rubs, or gallops. No clubbing, cyanosis, edema.  Radials/PT 2+ and equal bilaterally.  Respiratory:  Respirations regular and unlabored, scattered rhonchi. GI: Soft, nontender, nondistended, BS + x 4. MS: no deformity or atrophy. Skin: warm and dry, no rash. Neuro:  Strength and sensation are intact. Psych: Normal affect.  Accessory Clinical Findings    ECG personally reviewed by me today-regular sinus rhythm, 70- No acute changes  Lab Results  Component Value Date   WBC 5.4 01/19/2019   HGB 12.1 (L) 01/19/2019   HCT 37.8 (L) 01/19/2019   MCV 95.2 01/19/2019   PLT 316 01/19/2019   Lab Results  Component Value Date   CREATININE 0.90 02/21/2019   BUN 20 01/11/2019   NA 138 01/11/2019   K 4.3 01/11/2019   CL 105 01/11/2019   CO2 25 01/11/2019   Lab Results    Component Value Date   ALT 16 01/11/2019   AST 16 01/11/2019   ALKPHOS 48 11/15/2018   BILITOT 0.6 01/11/2019   Lab Results  Component Value Date   CHOL 177 06/06/2018   HDL 53.40 06/06/2018   LDLCALC 95 06/06/2018   LDLDIRECT 120.1 02/07/2006   TRIG 142.0 06/06/2018   CHOLHDL 3 06/06/2018     Assessment & Plan   1.  Severe aortic stenosis: At last visit, patient reported increasing dyspnea on exertion and follow-up echo showed an EF of 30-07%, grade 1 diastolic dysfunction, mean aortic valve gradient of 41 mmHg with a peak gradient of 66 mmHg, V-max of 407 cm/s, and aortic valve area of 1.2 cm (VTI).  In that setting, arrangements were made for follow-up today to discuss diagnostic catheterization and future work-up.  The patient understands that risks include but are not limited to stroke (1 in 1000), death (1 in 60), kidney failure [usually temporary] (1 in 500), bleeding (1 in 200), allergic reaction [possibly serious] (1 in 200), and agrees to proceed.  He needs to check his work schedule but we hope to get this done within the next 2 weeks.    2.  Essential hypertension: Stable on ARB therapy.  3.  Disposition: Arrange for diagnostic catheterization after patient checks his schedule.  Follow-up dependent upon timing of scheduling of cath  and findings.  Murray Hodgkins, NP 08/31/2019, 1:13 PM

## 2019-08-31 NOTE — Telephone Encounter (Signed)
Pending cath schedule   Will need 2 wk fu

## 2019-09-03 ENCOUNTER — Other Ambulatory Visit: Payer: Self-pay | Admitting: Nurse Practitioner

## 2019-09-03 ENCOUNTER — Encounter: Payer: Self-pay | Admitting: *Deleted

## 2019-09-03 DIAGNOSIS — R0602 Shortness of breath: Secondary | ICD-10-CM

## 2019-09-03 DIAGNOSIS — I350 Nonrheumatic aortic (valve) stenosis: Secondary | ICD-10-CM

## 2019-09-03 NOTE — Telephone Encounter (Signed)
Spoke with patient and reviewed scheduling heart cath for this Thursday. Reviewed time, date, and location for both labs, COVID swab, and procedure date. Letter completed with all information for procedure and he will come by to pick that up today. Letter done with all information to include post follow up appointment. He verbalized of all instructions with no further questions at this time.

## 2019-09-04 ENCOUNTER — Other Ambulatory Visit: Payer: Self-pay

## 2019-09-04 ENCOUNTER — Other Ambulatory Visit
Admission: RE | Admit: 2019-09-04 | Discharge: 2019-09-04 | Disposition: A | Discharge status: Home / Self Care | Payer: Medicare Other | Source: Ambulatory Visit | Attending: Nurse Practitioner | Admitting: Nurse Practitioner

## 2019-09-04 ENCOUNTER — Other Ambulatory Visit
Admission: RE | Admit: 2019-09-04 | Discharge: 2019-09-04 | Disposition: A | Discharge status: Home / Self Care | Payer: Medicare Other | Source: Ambulatory Visit | Attending: Cardiovascular Disease | Admitting: Cardiovascular Disease

## 2019-09-04 DIAGNOSIS — R0602 Shortness of breath: Secondary | ICD-10-CM

## 2019-09-04 DIAGNOSIS — Z01812 Encounter for preprocedural laboratory examination: Secondary | ICD-10-CM | POA: Diagnosis not present

## 2019-09-04 DIAGNOSIS — I350 Nonrheumatic aortic (valve) stenosis: Secondary | ICD-10-CM

## 2019-09-04 DIAGNOSIS — Z20822 Contact with and (suspected) exposure to covid-19: Secondary | ICD-10-CM | POA: Insufficient documentation

## 2019-09-04 LAB — CBC
HCT: 35.5 % — ABNORMAL LOW (ref 39.0–52.0)
Hemoglobin: 12.2 g/dL — ABNORMAL LOW (ref 13.0–17.0)
MCH: 31.5 pg (ref 26.0–34.0)
MCHC: 34.4 g/dL (ref 30.0–36.0)
MCV: 91.7 fL (ref 80.0–100.0)
Platelets: 259 10*3/uL (ref 150–400)
RBC: 3.87 MIL/uL — ABNORMAL LOW (ref 4.22–5.81)
RDW: 13.5 % (ref 11.5–15.5)
WBC: 4 10*3/uL (ref 4.0–10.5)
nRBC: 0 % (ref 0.0–0.2)

## 2019-09-04 LAB — BASIC METABOLIC PANEL
Anion gap: 8 (ref 5–15)
BUN: 21 mg/dL (ref 8–23)
CO2: 23 mmol/L (ref 22–32)
Calcium: 9.1 mg/dL (ref 8.9–10.3)
Chloride: 101 mmol/L (ref 98–111)
Creatinine, Ser: 0.76 mg/dL (ref 0.61–1.24)
GFR calc Af Amer: >60 mL/min (ref >60)
GFR calc non Af Amer: >60 mL/min (ref >60)
Glucose, Bld: 125 mg/dL — ABNORMAL HIGH (ref 70–99)
Potassium: 4.5 mmol/L (ref 3.5–5.1)
Sodium: 132 mmol/L — ABNORMAL LOW (ref 135–145)

## 2019-09-05 LAB — SARS CORONAVIRUS 2 (TAT 6-24 HRS): SARS Coronavirus 2: NEGATIVE

## 2019-09-06 ENCOUNTER — Ambulatory Visit
Admission: RE | Admit: 2019-09-06 | Discharge: 2019-09-06 | Disposition: A | Discharge status: Home / Self Care | Payer: Medicare Other | Attending: Cardiovascular Disease | Admitting: Cardiovascular Disease

## 2019-09-06 ENCOUNTER — Encounter
Admission: RE | Disposition: A | Discharge status: Home / Self Care | Payer: Medicare Other | Source: Home / Self Care | Attending: Cardiovascular Disease

## 2019-09-06 ENCOUNTER — Telehealth: Payer: Self-pay | Admitting: *Deleted

## 2019-09-06 ENCOUNTER — Encounter: Payer: Self-pay | Admitting: Cardiovascular Disease

## 2019-09-06 DIAGNOSIS — I350 Nonrheumatic aortic (valve) stenosis: Secondary | ICD-10-CM | POA: Diagnosis not present

## 2019-09-06 DIAGNOSIS — Z79899 Other long term (current) drug therapy: Secondary | ICD-10-CM | POA: Insufficient documentation

## 2019-09-06 DIAGNOSIS — R0602 Shortness of breath: Secondary | ICD-10-CM | POA: Diagnosis not present

## 2019-09-06 DIAGNOSIS — E785 Hyperlipidemia, unspecified: Secondary | ICD-10-CM | POA: Insufficient documentation

## 2019-09-06 DIAGNOSIS — Z902 Acquired absence of lung [part of]: Secondary | ICD-10-CM | POA: Diagnosis not present

## 2019-09-06 DIAGNOSIS — I359 Nonrheumatic aortic valve disorder, unspecified: Secondary | ICD-10-CM

## 2019-09-06 DIAGNOSIS — Z882 Allergy status to sulfonamides status: Secondary | ICD-10-CM | POA: Diagnosis not present

## 2019-09-06 DIAGNOSIS — Z87891 Personal history of nicotine dependence: Secondary | ICD-10-CM | POA: Insufficient documentation

## 2019-09-06 DIAGNOSIS — I200 Unstable angina: Secondary | ICD-10-CM

## 2019-09-06 DIAGNOSIS — I10 Essential (primary) hypertension: Secondary | ICD-10-CM | POA: Diagnosis not present

## 2019-09-06 DIAGNOSIS — Z85118 Personal history of other malignant neoplasm of bronchus and lung: Secondary | ICD-10-CM | POA: Diagnosis not present

## 2019-09-06 HISTORY — PX: RIGHT/LEFT HEART CATH AND CORONARY ANGIOGRAPHY: CATH118266

## 2019-09-06 SURGERY — RIGHT/LEFT HEART CATH AND CORONARY ANGIOGRAPHY
Anesthesia: Moderate Sedation | Laterality: Bilateral

## 2019-09-06 MED ORDER — FENTANYL CITRATE (PF) 100 MCG/2ML IJ SOLN
INTRAMUSCULAR | Status: AC
  Filled 2019-09-06: qty 2

## 2019-09-06 MED ORDER — SODIUM CHLORIDE 0.9% FLUSH: 3.0000 mL | INTRAVENOUS | Status: DC | PRN

## 2019-09-06 MED ORDER — IOHEXOL 300 MG/ML  SOLN
INTRAMUSCULAR | Status: DC | PRN
  Administered 2019-09-06: 80 mL

## 2019-09-06 MED ORDER — SODIUM CHLORIDE 0.9 % IV SOLN: 250.0000 mL | INTRAVENOUS | Status: DC | PRN

## 2019-09-06 MED ORDER — SODIUM CHLORIDE 0.9 % IV SOLN: INTRAVENOUS | Status: DC

## 2019-09-06 MED ORDER — SODIUM CHLORIDE 0.9% FLUSH: 3.0000 mL | Freq: Two times a day (BID) | INTRAVENOUS | Status: DC

## 2019-09-06 MED ORDER — FENTANYL CITRATE (PF) 100 MCG/2ML IJ SOLN
INTRAMUSCULAR | Status: DC | PRN
Start: 2019-09-06 — End: 2019-09-06
  Administered 2019-09-06: 50 ug via INTRAVENOUS

## 2019-09-06 MED ORDER — MIDAZOLAM HCL 2 MG/2ML IJ SOLN
INTRAMUSCULAR | Status: AC
  Filled 2019-09-06: qty 2

## 2019-09-06 MED ORDER — ASPIRIN 81 MG PO CHEW
CHEWABLE_TABLET | ORAL | Status: AC
  Administered 2019-09-06: 81 mg via ORAL
  Filled 2019-09-06: qty 1

## 2019-09-06 MED ORDER — ASPIRIN 81 MG PO CHEW: 81.0000 mg | CHEWABLE_TABLET | ORAL | Status: AC

## 2019-09-06 MED ORDER — MIDAZOLAM HCL 2 MG/2ML IJ SOLN
INTRAMUSCULAR | Status: DC | PRN
  Administered 2019-09-06: 1 mg via INTRAVENOUS

## 2019-09-06 SURGICAL SUPPLY — 13 items
CATH INFINITI 5FR JL4 (CATHETERS) ×2 IMPLANT
CATH INFINITI JR4 5F (CATHETERS) ×2 IMPLANT
CATH SWANZ 7F THERMO (CATHETERS) ×2 IMPLANT
DEVICE CLOSURE MYNXGRIP 5F (Vascular Products) ×2 IMPLANT
KIT MANI 3VAL PERCEP (MISCELLANEOUS) ×3 IMPLANT
NDL PERC 18GX7CM (NEEDLE) IMPLANT
NEEDLE PERC 18GX7CM (NEEDLE) ×3 IMPLANT
PACK CARDIAC CATH (CUSTOM PROCEDURE TRAY) ×3 IMPLANT
SHEATH AVANTI 5FR X 11CM (SHEATH) ×2 IMPLANT
SHEATH AVANTI 7FRX11 (SHEATH) ×2 IMPLANT
WIRE EMERALD ST .035X150CM (WIRE) ×2 IMPLANT
WIRE GUIDERIGHT .035X150 (WIRE) ×2 IMPLANT
WIRE HITORQ VERSACORE ST 145CM (WIRE) ×2 IMPLANT

## 2019-09-06 NOTE — Telephone Encounter (Signed)
-----   Message from Barkley Boards, RN sent at 09/06/2019 12:19 PM EDT ----- Regarding: RE: TAVR Nope.  I will contact the pt and arrange TAVR consult.  ----- Message ----- From: Emily Filbert, RN Sent: 09/06/2019  11:46 AM EDT To: Barkley Boards, RN Subject: RE: TAVR                                       [place]! I bet!! Is there anything else we need to do at this point?  ----- Message ----- From: Barkley Boards, RN Sent: 09/06/2019  11:36 AM EDT To: Emily Filbert, RN Subject: RE: TAVR                                       Hey,  You do not have to put in a separate referral to structural.  Surgeries are on hold but we are proceeding with evaluating TAVR pt's.  We will have a log jam once surgeries do start back but it will be a okay.   Thanks, Lauren  ----- Message ----- From: Emily Filbert, RN Sent: 09/06/2019  11:31 AM EDT To: Barkley Boards, RN Subject: FW: TAVR                                       Hey Lauren! I haven't had to refer to you all in a while. Do I need to put in an ambulatory referral to ... for that. Are the TAVR's on hold for right now due to COVID?   ----- Message ----- From: Minna Merritts, MD Sent: 09/06/2019  11:17 AM EDT To: [facility] Triage Subject: TAVR                                           Can we place referral to TAVR clinic  in [place] for severe aortic valve stenosis Thx TG

## 2019-09-09 NOTE — H&P (Signed)
H&P Addendum, precardiac catheterization  Patient was seen and evaluated prior to Cardiac catheterization procedure Symptoms, prior testing details again confirmed with the patient Patient examined, no significant change from prior exam Lab work reviewed in detail personally by myself Patient understands risk and benefit of the procedure, willing to proceed  Signed, Tim Quinlyn Tep, MD, Ph.D [facility]    

## 2019-09-09 NOTE — Interval H&P Note (Signed)
History and Physical Interval Note:  09/09/2019 3:18 PM  Brett Mcguire  has presented today for surgery, with the diagnosis of RT and LT Cath    Shortness of breath on exertion  Aortic valve stenosis, unstable angina.  The various methods of treatment have been discussed with the patient and family. After consideration of risks, benefits and other options for treatment, the patient has consented to  Procedure(s): RIGHT/LEFT HEART CATH AND CORONARY ANGIOGRAPHY (Bilateral) as a surgical intervention.  The patient's history has been reviewed, patient examined, no change in status, stable for surgery.  I have reviewed the patient's chart and labs.  Questions were answered to the patient's satisfaction.     Brett Mcguire

## 2019-09-12 ENCOUNTER — Other Ambulatory Visit: Payer: Self-pay | Admitting: Physician Assistant

## 2019-09-12 DIAGNOSIS — I350 Nonrheumatic aortic (valve) stenosis: Secondary | ICD-10-CM

## 2019-09-13 ENCOUNTER — Encounter: Payer: Self-pay | Admitting: Physician Assistant

## 2019-09-13 ENCOUNTER — Other Ambulatory Visit: Payer: Self-pay

## 2019-09-13 ENCOUNTER — Encounter: Payer: Self-pay | Admitting: Cardiovascular Disease

## 2019-09-13 ENCOUNTER — Ambulatory Visit (INDEPENDENT_AMBULATORY_CARE_PROVIDER_SITE_OTHER): Payer: Medicare Other | Admitting: Cardiovascular Disease

## 2019-09-13 VITALS — BP 144/80 | HR 69 | Ht 68.5 in | Wt 164.6 lb

## 2019-09-13 DIAGNOSIS — I350 Nonrheumatic aortic (valve) stenosis: Secondary | ICD-10-CM

## 2019-09-13 NOTE — Patient Instructions (Signed)
See attached for your upcoming appointments!

## 2019-09-13 NOTE — Progress Notes (Signed)
Cardiology Office Note:    Date:  09/13/2019   ID:  Brett Mcguire, DOB [birth year], MRN [id number]  PCP:  Tonia Ghent, MD  [facility] Cardiologist:  Ida Rogue, MD  [place] Electrophysiologist:  None   Referring MD: Tonia Ghent, MD   Chief Complaint  Patient presents with  . Shortness of Breath    History of Present Illness:    Brett Mcguire is a 74 y.o. male referred for evaluation of aortic stenosis by Dr [name].   He is here with his wife today. He has known of a heart murmur for approximately 10 years. He had a right upper lobectomy in 1989 at [facility] for treatment of lung cancer. He's had no recurrence. Surgery was complicated by a 'staph infection' but he ultimately recovered very well. He had no physical limitation for many years and has remained independent and physically active. However, over the past 1-2 years he has developed progressive shortness of breath with activity. He also has fatigue and describes feeling washed out with certain activities. He is short of breath with any moderate level of activity such as taking a dog for a walk. No chest pain or pressure. No orthopnea, PND, or palpitations.   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 They have 2 grown children, both in [place], [place]. He has developed psoriasis and has been on methotrexate for several months.  His skin condition has improved with the regular use of methotrexate.  He has no other complaints at this time.  Past Medical History:  Diagnosis Date  . Allergy 1989  . Aortic stenosis   . 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 Diverticulosis 02/2000  . Hyperlipidemia 1989  . Hypertension   . Lung cancer ([place]) 1989   s/p lobectomy, no chemo or rady  . Melanoma of skin ([place])    chest  . Murmur    aortic stenosis    Past Surgical History:  Procedure Laterality Date  . COLONOSCOPY  02/18/2000   Polyp, diverticulosis,  repeat in 5 years  . COLONOSCOPY  03/03/2006   Repeat  -  Divertics  . COLONOSCOPY WITH PROPOFOL N/A 12/22/2017   Procedure: COLONOSCOPY WITH PROPOFOL;  Surgeon: Virgel Manifold, MD;  Location: [facility] ENDOSCOPY;  Service: Endoscopy;  Laterality: N/A;  . CYSTOSCOPY WITH INSERTION OF UROLIFT    . Laminectomy/Discectomy  11/03/2005   L5/S1  . Malignant melanoma excision  06/20/2003   Dr. Nehemiah Massed  . MRI  10/09/2005   Lumbar spine, bulging disc L5/S1  . Right lobectomy  1989   Lung cancer  . RIGHT/LEFT HEART CATH AND CORONARY ANGIOGRAPHY Bilateral 09/06/2019   Procedure: RIGHT/LEFT HEART CATH AND CORONARY ANGIOGRAPHY;  Surgeon: Minna Merritts, MD;  Location: [facility];  Service: Cardiovascular;  Laterality: Bilateral;  . TONSILLECTOMY AND ADENOIDECTOMY      Current Medications: Current Meds  Medication Sig  . folic acid (FOLVITE) 1 MG tablet Take 1 mg by mouth daily.  Marland Kitchen losartan (COZAAR) 25 MG tablet Take 1 tablet (25 mg total) by mouth daily.  . methotrexate 2.5 MG tablet Take 10 mg by mouth once a week.   Vladimir Faster Glycol-Propyl Glycol (SYSTANE ULTRA) 0.4-0.3 % SOLN Place 1 drop into both eyes daily as needed (Dry eye).  . Skin Protectants, Misc. (EUCERIN) cream Apply 1 application topically daily as needed for dry skin.     Allergies:  Alfuzosin, Cialis [tadalafil], Doxazosin, Ephedrine, Finasteride, Flomax [tamsulosin hcl], Rapaflo [silodosin], and Sulfonamide derivatives   Social History   Socioeconomic History  . Marital status: Married    Spouse name: Tye [place]  . Number of children: 2  . Years of education: Not on file  . Highest education level: Not on file  Occupational History  . 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 Smoking status: Former Smoker    Packs/day: 2.00    Years: 17.00    Pack years: 34.00    Types: Cigarettes    Quit date: 02/22/1982    Years since quitting: 37.5  .  Smokeless tobacco: Never Used  Vaping Use  . Vaping Use: Never used  Substance and Sexual Activity  . Alcohol use: Yes    Alcohol/week: 10.0 standard drinks    Types: 10 Glasses of wine per week    Comment: wine  . Drug use: No  . Sexual activity: Yes  Other Topics Concern  . 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    Married 40+ years   Social Determinants of Radio broadcast assistant Strain:   . Difficulty of Paying Living Expenses: Not on file  Food Insecurity:   . Worried About Charity fundraiser in the Last Year: Not on file  . Ran Out of Food in the Last Year: Not on file  Transportation Needs:   . Lack of Transportation (Medical): Not on file  . Lack of Transportation (Non-Medical): Not on file  Physical Activity:   . Days of Exercise per Week: Not on file  . Minutes of Exercise per Session: Not on file  Stress:   . Feeling of Stress : Not on file  Social Connections:   . Frequency of Communication with Friends and Family: Not on file  . Frequency of Social Gatherings with Friends and Family: Not on file  . Attends Religious Services: Not on file  . Active Member of Clubs or Organizations: Not on file  . Attends Archivist Meetings: Not on file  . Marital Status: Not on file     Family History: The patient's family history includes Alcohol abuse in his mother; Cancer in his father, mother, and another family member; Prostate cancer in his father. There is no history of Colon cancer.  ROS:   Please see the history of present illness.    Urinary hesitancy. All other systems reviewed and are negative.  EKGs/Labs/Other Studies Reviewed:    The following studies were reviewed today: Cardiac Cath 09-06-2019:  Procedural Findings:  Hemodynamics:     AO 141/61/91, additional measurements obtained later in the case with peak systolic pressure 962    LV 219/3/14     Coronary angiography:  Coronary dominance: Right  Left mainstem:   Large vessel that bifurcates into the LAD and left circumflex, no significant disease noted  Left anterior descending (LAD):   Large vessel that extends to the apical region, diagonal branch 2 of moderate size, no significant disease noted  Left circumflex (LCx):  Large vessel with OM branch 2, no significant disease noted  Right coronary artery (RCA):  Right dominant vessel with PL and PDA, no significant disease noted  Left ventriculography: Aortic valve crossed for pressures,  significant aortic valve stenosis Pressure across aortic  valve 50+ mm Hg,  ectopy appreciated LV gram not performed given recent echocardiogram results available  Right heart pressures RA 2/2 RV 17/-1/3 PA 19/1/8 Wedge 5/3/1 Cardiac output 5.34 Cardiac index 2.85    Final Conclusions:   No significant coronary artery disease Severe aortic valve stenosis Valve gradient likely between 50 and 60 mmHg, peak to peak  Recommendations:  We will place referral to TAVR team in [facility] for consideration of surgery for his aortic valve, could consider noninvasive options such as TAVR --Close monitoring of blood pressure as outpatient  Echo 08/22/2019: IMPRESSIONS    1. Left ventricular ejection fraction, by estimation, is 60 to 65%. The  left ventricle has normal function. The left ventricle has no regional  wall motion abnormalities. There is mild left ventricular hypertrophy.  Left ventricular diastolic parameters  are consistent with Grade I diastolic dysfunction (impaired relaxation).  2. Right ventricular systolic function is normal. The right ventricular  size is normal.  3. The mitral valve is normal in structure. Trivial mitral valve  regurgitation.  4. Ao valve Vmax 407cm/s, PG 67mmHg, Mean gradient 64mmHg, AVA per VTI  1.2cm2. The aortic valve has an indeterminant number of cusps. Aortic  valve regurgitation is not  visualized. Severe aortic valve stenosis.  5. The inferior vena cava is normal in size with greater than 50%  respiratory variability, suggesting right atrial pressure of 3 mmHg.   FINDINGS  Left Ventricle: Left ventricular ejection fraction, by estimation, is 60  to 65%. The left ventricle has normal function. The left ventricle has no  regional wall motion abnormalities. The left ventricular internal cavity  size was normal in size. There is  mild left ventricular hypertrophy. Left ventricular diastolic parameters  are consistent with Grade I diastolic dysfunction (impaired relaxation).   Right Ventricle: The right ventricular size is normal. No increase in  right ventricular wall thickness. Right ventricular systolic function is  normal.   Left Atrium: Left atrial size was normal in size.   Right Atrium: Right atrial size was normal in size.   Pericardium: There is no evidence of pericardial effusion.   Mitral Valve: The mitral valve is normal in structure. Trivial mitral  valve regurgitation.   Tricuspid Valve: The tricuspid valve is normal in structure. Tricuspid  valve regurgitation is not demonstrated.   Aortic Valve: Ao valve Vmax 407cm/s, PG 21mmHg, Mean gradient 27mmHg, AVA  per VTI 1.2cm2. The aortic valve has an indeterminant number of cusps.  Aortic valve regurgitation is not visualized. Severe aortic stenosis is  present. Aortic valve mean gradient  measures 43.3 mmHg. Aortic valve peak gradient measures 82.1 mmHg. Aortic  valve area, by VTI measures 2.98 cm.   Pulmonic Valve: The pulmonic valve was not well visualized. Pulmonic valve  regurgitation is not visualized.   Aorta: The aortic root is normal in size and structure.   Venous: The inferior vena cava is normal in size with greater than 50%  respiratory variability, suggesting right atrial pressure of 3 mmHg.   IAS/Shunts: No atrial level shunt detected by color flow Doppler.   LEFT VENTRICLE    PLAX 2D  LVIDd:     4.00 cm   Diastology  LVIDs:     2.50 cm   LV e' lateral:  5.98 cm/s  LV PW:     1.60 cm   LV E/e' lateral: 9.8  LV IVS:    1.70 cm   LV e' medial:  4.46 cm/s  LVOT diam:  2.40 cm   LV E/e' medial: 13.2  LV SV:     287  LV SV Index:  151  LVOT Area:   4.52 cm    LV Volumes (MOD)  LV vol d, MOD A2C: 94.1 ml  LV vol d, MOD A4C: 87.9 ml  LV vol s, MOD A2C: 41.1 ml  LV vol s, MOD A4C: 40.6 ml  LV SV MOD A2C:   53.0 ml  LV SV MOD A4C:   87.9 ml  LV SV MOD BP:   52.0 ml   RIGHT VENTRICLE       IVC  RV S prime:   13.40 cm/s IVC diam: 1.60 cm  TAPSE (M-mode): 2.1 cm   LEFT ATRIUM       Index  LA diam:    3.70 cm 1.95 cm/m  LA Vol (A2C):  48.1 ml 25.40 ml/m  LA Vol (A4C):  47.2 ml 24.93 ml/m  LA Biplane Vol: 49.9 ml 26.35 ml/m  AORTIC VALVE          PULMONIC VALVE  AV Area (Vmax):  2.55 cm   PV Vmax:    0.76 m/s  AV Area (Vmean):  2.82 cm   PV Peak grad: 2.3 mmHg  AV Area (VTI):   2.98 cm  AV Vmax:      453.00 cm/s  AV Vmean:     306.667 cm/s  AV VTI:      0.960 m  AV Peak Grad:   82.1 mmHg  AV Mean Grad:   43.3 mmHg  LVOT Vmax:     255.50 cm/s  LVOT Vmean:    191.500 cm/s  LVOT VTI:     0.634 m  LVOT/AV VTI ratio: 0.66    AORTA  Ao Root diam: 3.05 cm  Ao Asc diam: 2.60 cm  Ao Arch diam: 2.7 cm   MITRAL VALVE  MV Area (PHT): 1.82 cm   SHUNTS  MV Decel Time: 416 msec   Systemic VTI: 0.63 m  MV E velocity: 58.80 cm/s  Systemic Diam: 2.40 cm  MV A velocity: 103.00 cm/s  MV E/A ratio: 0.57   CT Chest: IMPRESSION: 1. No evidence of primary malignancy in the chest, abdomen or pelvis. 2. Scattered peripheral pulmonary nodules measure 3 mm or less in size. No follow-up needed if patient is low-risk (and has no known or suspected primary neoplasm). Non-contrast chest CT can be considered in 12 months if  patient is high-risk. This recommendation follows the consensus statement: Guidelines for Management of Incidental Pulmonary Nodules Detected on CT Images: From the Fleischner Society 2017; Radiology 2017; 284:228-243. 3. Aortic atherosclerosis (ICD10-I70.0). Coronary artery calcification. 4.  Emphysema (ICD10-J43.9).  EKG:  EKG is not ordered today.  The ekg ordered 08-31-2019 demonstrates NSR 70 bpm, within normal limits  Recent Labs: 01/11/2019: ALT 16; TSH 2.09 09/04/2019: BUN 21; Creatinine, Ser 0.76; Hemoglobin 12.2; Platelets 259; Potassium 4.5; Sodium 132  Recent Lipid Panel    Component Value Date/Time   CHOL 177 06/06/2018 1207   TRIG 142.0 06/06/2018 1207   HDL 53.40 06/06/2018 1207   CHOLHDL 3 06/06/2018 1207   VLDL 28.4 06/06/2018 1207   LDLCALC 95 06/06/2018 1207   LDLDIRECT 120.1 02/07/2006 0955    Physical Exam:    VS:  BP (!) 144/80   Pulse 69   Ht 5' 8.5" (1.74 m)   Wt 164 lb 9.6 oz (74.7 kg)   SpO2 96%   BMI 24.66 kg/m  Wt Readings from Last 3 Encounters:  09/13/19 164 lb 9.6 oz (74.7 kg)  09/06/19 162 lb 4.1 oz (73.6 kg)  08/31/19 162 lb 3.2 oz (73.6 kg)     GEN:  Well nourished, well developed in no acute distress HEENT: Normal NECK: No JVD; No carotid bruits LYMPHATICS: No lymphadenopathy CARDIAC: RRR, 3/6 harsh late peaking systolic murmur at the right upper sternal border, A2 present but diminished, no diastolic murmur. RESPIRATORY:  Clear to auscultation without rales, wheezing or rhonchi  ABDOMEN: Soft, non-tender, non-distended MUSCULOSKELETAL:  No edema; No deformity  SKIN: Warm and dry NEUROLOGIC:  Alert and oriented x 3 PSYCHIATRIC:  Normal affect   STS Risk Calculator (Isolated AVR): Risk of Mortality: 1.475% Renal Failure: 1.098% Permanent Stroke: 1.622% Prolonged Ventilation: 4.254% DSW Infection: 0.152% Reoperation: 4.906% Morbidity or Mortality: 8.998% Short Length of Stay: 45.177% Long Length of  Stay: 4.724%  ASSESSMENT:    1. Severe aortic stenosis    PLAN:    In order of problems listed above:  1. I have reviewed the natural history of aortic stenosis with the patient and his wife who is present today. We have discussed the limitations of medical therapy and the poor prognosis associated with symptomatic aortic stenosis. We have reviewed potential treatment options, including palliative medical therapy, conventional surgical aortic valve replacement, and transcatheter aortic valve replacement. We discussed treatment options in the context of the patient's specific comorbid medical conditions.  I have personally reviewed the patient's echo and cardiac catheterization images.  Cardiac catheterization is notable for the absence of significant coronary artery disease.  There is bulky calcification of the aortic valve visualized on plain fluoroscopy.  Hemodynamic data is reviewed with peak to peak pressure gradient of 50 to 60 mm across the aortic valve.  The patient's echocardiogram demonstrates normal LV systolic function with preserved LV function.  The aortic valve is severely thickened and calcified with the appearance of a bicuspid valve.  Doppler data confirm severe aortic stenosis with a mean pressure gradient above 40 mmHg and peak systolic velocity greater than 4 m/s.  I discussed some of the pros and cons related to transcatheter aortic valve replacement versus surgical aortic valve replacement with the patient and his wife today.  Treatment is clearly indicated in this symptomatic patient with severe aortic stenosis.  I demonstrated the TAVR procedural animation with the patient and discussed potential risks and benefits of this procedure.  He understands that it is important to further define anatomic suitability for TAVR.  He also understands that some features of bicuspid aortic valve may predict a better outcome with surgical aortic valve replacement.  Will order a CTA of the chest,  abdomen, and pelvis as well as a gated CTA of the heart to further assess anatomic feasibility of TAVR.  Following his CTA study, he will be referred for formal surgical consultation as part of a multidisciplinary approach to his care.  Until he undergoes aortic valve replacement, he is advised to avoid strenuous physical activity.   Medication Adjustments/Labs and Tests Ordered: Current medicines are reviewed at length with the patient today.  Concerns regarding medicines are outlined above.  No orders of the defined types were placed in this encounter.  No orders of the defined types were placed in this encounter.   Patient Instructions  See attached for your upcoming appointments!    Signed, Sherren Mocha, MD  09/13/2019 1:01 PM    [facility]

## 2019-09-18 ENCOUNTER — Ambulatory Visit (HOSPITAL_BASED_OUTPATIENT_CLINIC_OR_DEPARTMENT_OTHER)
Admission: RE | Admit: 2019-09-18 | Discharge: 2019-09-18 | Disposition: A | Discharge status: Home / Self Care | Payer: Medicare Other | Source: Ambulatory Visit | Attending: Physician Assistant | Admitting: Physician Assistant

## 2019-09-18 ENCOUNTER — Ambulatory Visit: Payer: Medicare Other | Attending: Physician Assistant | Admitting: Physical Therapy

## 2019-09-18 ENCOUNTER — Other Ambulatory Visit: Payer: Self-pay

## 2019-09-18 ENCOUNTER — Encounter: Payer: Self-pay | Admitting: Physician Assistant

## 2019-09-18 ENCOUNTER — Ambulatory Visit (HOSPITAL_COMMUNITY)
Admission: RE | Admit: 2019-09-18 | Discharge: 2019-09-18 | Disposition: A | Discharge status: Home / Self Care | Payer: Medicare Other | Source: Ambulatory Visit | Attending: Physician Assistant | Admitting: Physician Assistant

## 2019-09-18 DIAGNOSIS — R262 Difficulty in walking, not elsewhere classified: Secondary | ICD-10-CM | POA: Diagnosis not present

## 2019-09-18 DIAGNOSIS — I700 Atherosclerosis of aorta: Secondary | ICD-10-CM | POA: Diagnosis not present

## 2019-09-18 DIAGNOSIS — I350 Nonrheumatic aortic (valve) stenosis: Secondary | ICD-10-CM

## 2019-09-18 DIAGNOSIS — Z01818 Encounter for other preprocedural examination: Secondary | ICD-10-CM | POA: Diagnosis not present

## 2019-09-18 DIAGNOSIS — I70203 Unspecified atherosclerosis of native arteries of extremities, bilateral legs: Secondary | ICD-10-CM | POA: Diagnosis not present

## 2019-09-18 DIAGNOSIS — K5730 Diverticulosis of large intestine without perforation or abscess without bleeding: Secondary | ICD-10-CM | POA: Diagnosis not present

## 2019-09-18 MED ORDER — IOHEXOL 350 MG/ML SOLN
100.0000 mL | Freq: Once | INTRAVENOUS | Status: AC | PRN
  Administered 2019-09-18: 100 mL via INTRAVENOUS

## 2019-09-18 NOTE — Therapy (Signed)
[place], [place], [address] Phone: [phone]   Fax:  [phone]  Physical Therapy Evaluation/Pre-TAVR  Patient Details  Name: Brett Mcguire MRN: [id number] Date of Birth: 1945/11/21 Referring Provider (PT): Angelena Form, PA-C   Encounter Date: 09/18/2019   PT End of Session - 09/18/19 1307    Visit Number 1    PT Start Time 4196    PT Stop Time 1331    PT Time Calculation (min) 33 min    Activity Tolerance Patient tolerated treatment well    Behavior During Therapy [facility] for tasks assessed/performed           Past Medical History:  Diagnosis Date  . Allergy 1989  . 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 Carotid artery disease ([place])   . Diverticulosis 02/2000  . Hyperlipidemia 1989  . Hypertension   . Lung cancer ([place]) 1989   s/p lobectomy, no chemo or rady  . Melanoma of skin ([place])    chest  . Severe aortic stenosis     Past Surgical History:  Procedure Laterality Date  . COLONOSCOPY  02/18/2000   Polyp, diverticulosis, repeat in 5 years  . COLONOSCOPY  03/03/2006   Repeat  -  Divertics  . COLONOSCOPY WITH PROPOFOL N/A 12/22/2017   Procedure: COLONOSCOPY WITH PROPOFOL;  Surgeon: Virgel Manifold, MD;  Location: [facility] ENDOSCOPY;  Service: Endoscopy;  Laterality: N/A;  . CYSTOSCOPY WITH INSERTION OF UROLIFT    . Laminectomy/Discectomy  11/03/2005   L5/S1  . Malignant melanoma excision  06/20/2003   Dr. Nehemiah Massed  . MRI  10/09/2005   Lumbar spine, bulging disc L5/S1  . Right lobectomy  1989   Lung cancer  . RIGHT/LEFT HEART CATH AND CORONARY ANGIOGRAPHY Bilateral 09/06/2019   Procedure: RIGHT/LEFT HEART CATH AND CORONARY ANGIOGRAPHY;  Surgeon: Minna Merritts, MD;  Location: [place] [facility];  Service: Cardiovascular;  Laterality: Bilateral;  . TONSILLECTOMY AND ADENOIDECTOMY      There were no vitals filed for this visit.    Subjective Assessment -  09/18/19 1302    Subjective Was told 5-6 years ago by cardiologist I was going to need a valve replacement. About 5 years ago I noticed SOB and progressed gradually. Numbness in UEs at night. HAs frequently from neck pain from moving around so much when sleeping. Last night I noticed a very noisy heartbeat. Now I get SOB just walking the dog across the flat lawn.    Currently in Pain? Yes    Pain Score --   none right now   Pain Location Shoulder    Pain Orientation Right;Left    Pain Descriptors / Indicators Aching    Pain Radiating Towards HA    Aggravating Factors  night pain    Pain Relieving Factors wake up and move around              [facility] PT Assessment - 09/18/19 0001      Assessment   Medical Diagnosis severe aortic stenosis    Referring Provider (PT) Angelena Form, PA-C    Onset Date/Surgical Date --   about 5 years ago   Hand Dominance Right      Precautions   Precautions None      Restrictions   Weight Bearing Restrictions No      Balance Screen   Has the patient fallen in the past 6 months No   stumbles but no  falls     [place] residence    Living Arrangements Spouse/significant other      Prior Function   Level of [place] Retired    Biomedical scientist cares for dogs      Cognition   Overall Cognitive Status Within Functional Limits for tasks assessed      Observation/Other Assessments   Focus on Therapeutic Outcomes (FOTO)  n/a TAVR      Sensation   Additional Comments occasional N/T in UEs      Posture/Postural Control   Posture Comments mild rounded shoulders      ROM / Strength   AROM / PROM / Strength AROM;Strength      AROM   Overall AROM Comments WFL      Strength   Overall Strength Comments gross 4+/5    Strength Assessment Site Hand    Right/Left hand Right;Left    Right Hand Grip (lbs) 55    Left Hand Grip (lbs) 50            [facility] Pre-Surgical  Assessment - 09/18/19 0001    5 Meter Walk Test- trial 1 3 sec    5 Meter Walk Test- trial 2 3 sec.     5 Meter Walk Test- trial 3 4 sec.    5 meter walk test average 3.33 sec    4 Stage Balance Test Position 4    Sit To Stand Test- trial 1 11 sec.    6 Minute Walk- Baseline yes    BP (mmHg) 142/67    HR (bpm) 78    02 Sat (%RA) 96 %    Modified Borg Scale for Dyspnea 0- Nothing at all    Perceived Rate of Exertion (Borg) 6-    6 Minute Walk Post Test yes    BP (mmHg) 132/80    HR (bpm) 83    02 Sat (%RA) 94 %    Modified Borg Scale for Dyspnea 0.5- Very, very slight shortness of breath    Perceived Rate of Exertion (Borg) 11- Fairly light    Aerobic Endurance Distance Walked 1320    Endurance additional comments 24% limitation compared to age-related normative values                    Objective measurements completed on examination: See above findings.                            Plan - 09/18/19 1335    Consulted and Agree with Plan of Care Patient           Clinical Impression Statement: Pt is a 74 yo M presenting to OP PT for evaluation prior to possible TAVR surgery due to severe aortic stenosis. Pt reports onset of SOB beginning about 5 years ago. Symptoms are  limiting functional endurance, especially on pitched yard. Pt presents with WFL ROM and strength and reports night-time musculoskeletal pain in bilateral shoulders.  Pt ambulated a total of 1320 feet in 6 minute walk and reported .5/10 SOB on modified scale for dyspena and 11/20 RPE on Borg's perceived exertion and pain scale at the end of the walk. During the 6 minute walk test, patient's HR increased to 94 BPM and O2 saturation decreased to 91%. Based on the Short Physical Performance Battery, patient has a frailty rating of 12/12 with </= 5/12 considered frail.  Visit  Diagnosis: Difficulty in walking, not elsewhere classified     Problem List Patient Active Problem List    Diagnosis Date Noted  . Unstable angina ([place]) 09/06/2019  . Severe aortic valve stenosis 09/06/2019  . Anemia 01/14/2019  . Healthcare maintenance 06/11/2018  . Shoulder pain 01/19/2018  . Fecal occult blood test positive   . Benign neoplasm of cecum   . Polyp of sigmoid colon   . Internal hemorrhoids   . Diverticulosis of large intestine without diverticulitis   . Diarrhea 12/11/2017  . Easy bruising 11/10/2017  . Fatigue 08/04/2017  . PSA elevation 06/09/2017  . Medicare annual wellness visit, subsequent 05/31/2017  . Bilateral carotid artery stenosis 05/07/2016  . Shortness of breath 05/07/2016  . Medicare annual wellness visit, initial 02/25/2015  . Advance care planning 02/25/2015  . Bilateral carotid bruits 02/18/2015  . Aortic valve disease 02/18/2015  . Vertigo 01/28/2015  . Olecranon bursitis 05/16/2014  . Rash and nonspecific skin eruption 03/20/2014  . Essential hypertension 01/01/2014  . Seasonal allergies 05/30/2013  . Right leg weakness 04/03/2013  . 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 MELANOMA 04/25/2007  . Hyperlipidemia 04/25/2007  . DIVERTICULOSIS, COLON 04/25/2007  . MICROSCOPIC HEMATURIA 04/25/2007  . Prostatitis 04/25/2007  . HEMATOSPERMIA 04/25/2007  . HYPERGLYCEMIA 04/25/2007   Sahaana Weitman C. Emmelina Mcloughlin PT, DPT 09/18/19 1:38 PM   [facility] [address] [place], [place], [address] Phone: [phone]   Fax:  [phone]  Name: Brett Mcguire MRN: [id number] Date of Birth: [birth year]

## 2019-09-19 ENCOUNTER — Telehealth: Payer: Self-pay | Admitting: Cardiovascular Disease

## 2019-09-19 NOTE — Telephone Encounter (Signed)
I will forward to [place] for dispo.

## 2019-09-19 NOTE — Telephone Encounter (Signed)
Upcoming visit cancelled and patient wants to know if no longer needing to fu in [place] since seeing Dr. Burt Knack or what new time frame for fu should be.   Please advise

## 2019-09-20 ENCOUNTER — Ambulatory Visit: Payer: Medicare Other | Admitting: Physician Assistant

## 2019-09-20 NOTE — Telephone Encounter (Signed)
Most of his immediate visits will be in [place] Once they have performed valve surgery, and he has recovered , they may release him He is welcome to follow-up back in [place] with our office or he can continue to follow in [place] if he prefers

## 2019-09-21 NOTE — Telephone Encounter (Signed)
Patient notified and verbalized understanding. He will plan to follow up with Dr [name] in [place] once the TAVR process is completed.

## 2019-09-26 ENCOUNTER — Encounter: Payer: Self-pay | Admitting: Thoracic Surgery (Cardiothoracic Vascular Surgery)

## 2019-09-26 ENCOUNTER — Other Ambulatory Visit: Payer: Self-pay

## 2019-09-26 ENCOUNTER — Institutional Professional Consult (permissible substitution) (INDEPENDENT_AMBULATORY_CARE_PROVIDER_SITE_OTHER): Payer: Medicare Other | Admitting: Thoracic Surgery (Cardiothoracic Vascular Surgery)

## 2019-09-26 VITALS — BP 170/72 | HR 74 | Temp 97.8°F | Resp 20 | Ht 68.5 in | Wt 160.0 lb

## 2019-09-26 DIAGNOSIS — I350 Nonrheumatic aortic (valve) stenosis: Secondary | ICD-10-CM | POA: Diagnosis not present

## 2019-09-26 DIAGNOSIS — R911 Solitary pulmonary nodule: Secondary | ICD-10-CM | POA: Diagnosis not present

## 2019-09-26 HISTORY — DX: Solitary pulmonary nodule: R91.1

## 2019-09-26 NOTE — Patient Instructions (Signed)
   Continue taking all current medications without change through the day before surgery.  Make sure to bring all of your medications with you when you come for your Pre-Admission Testing appointment at [facility]Stay [facility].  Have nothing to eat or drink after midnight the night before surgery.  On the morning of surgery do not take any medications  At your appointment for Pre-Admission Testing at the [facility] you will be asked to sign permission forms for your upcoming surgery.  By definition your signature on these forms implies that you and/or your designee provide full informed consent for your planned surgical procedure(s), that alternative treatment options have been discussed, that you understand and accept any and all potential risks, and that you have some understanding of what to expect for your post-operative convalescence.  For any major cardiac surgical procedure potential operative risks include but are not limited to at least some risk of death, stroke or other neurologic complication, myocardial infarction, congestive heart failure, respiratory failure, renal failure, bleeding requiring blood transfusion and/or reexploration, irregular heart rhythm, heart block or bradycardia requiring permanent pacemaker, pneumonia, pericardial effusion, pleural effusion, wound infection, pulmonary embolus or other thromboembolic complication, chronic pain, or other complications related to the specific procedure(s) performed.  For transcatheter aortic valve replacement additional risks include but are not limited to risk of paravalvular leak, valve embolization, valve thrombosis, aortic dissection, aortic rupture, ventricular septal defect or perforation, pericardial tamponade, injury of the abdominal aorta or its branches, and/or injury or occlusion of the arteries going to your arms or legs.  Please call to schedule a follow-up  appointment in our office prior to surgery if you have any unresolved questions about your planned surgical procedure, the associated risks, alternative treatment options, and/or expectations for your post-operative recovery.

## 2019-09-26 NOTE — Progress Notes (Addendum)
HEART AND [facility] SURGERY CONSULTATION REPORT  Primary Cardiologist is Ida Rogue, MD PCP is Tonia Ghent, MD  Chief Complaint  Patient presents with  . Aortic Stenosis    Surgical eval for TAVR, review all testing    HPI:  Patient is 74 year old male with history of aortic stenosis, hypertension, hyperlipidemia, remote history of lung cancer status post right upper lobectomy in 1989, remote history of tobacco use, and chronic skin disorder on long-term methotrexate who has been referred for surgical consultation to discuss treatment options for management of severe symptomatic aortic stenosis.  Patient states that he was first discovered to have a heart murmur on physical exam approximately 10 years ago.  He was initially followed by his primary care physician and later referred to Dr. [name] who has been following him regularly ever since.  Over the last several years the patient has experienced a gradual decline in his exercise tolerance with progressive exertional shortness of breath and increased fatigue.  Recent follow-up transthoracic echocardiogram performed August 22, 2019 revealed significant progression of disease and severe aortic stenosis.  Peak velocity across the aortic valve measured as high as 4.3 m/s corresponding to mean transvalvular gradient estimated 43 mmHg.  Left ventricular systolic function appeared normal with ejection fraction estimated 60 to 65%.  Diagnostic cardiac catheterization was performed by Dr. [name] September 06, 2019 and revealed normal coronary artery anatomy with no significant coronary artery disease.  Peak to peak and mean transvalvular gradients across the aortic valve were reported 66 and 41 mmHg respectively.  Pulmonary artery pressures were normal.  The patient was referred to the multidisciplinary heart valve clinic and has been evaluated previously by Dr. Burt Knack.  CT angiography  was performed and the patient was referred for surgical consultation.  Patient is married and lives near [place] on [place] acres of property with his wife.  He has been retired for many years having previously worked in [employer] for a book [place].  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  The patient has remained physically active all of his adult life.  He complains of a gradual progression of exertional shortness of breath and fatigue over the last several years.  He now gets short of breath and tired quite easily walking up an incline.  He does better on flat ground.  He denies resting shortness of breath, PND, orthopnea, or lower extremity edema.  He has had occasional mild dizzy spells without any history of syncope.  Past Medical History:  Diagnosis Date  . Allergy 1989  . 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 Carotid artery disease ([place])   . Diverticulosis 02/2000  . Hyperlipidemia 1989  . Hypertension   . Lung cancer ([place]) 1989   s/p lobectomy, no chemo or rady  . Melanoma of skin ([place])    chest  . Severe aortic stenosis     Past Surgical History:  Procedure Laterality Date  . COLONOSCOPY  02/18/2000   Polyp, diverticulosis, repeat in 5 years  . COLONOSCOPY  03/03/2006   Repeat  -  Divertics  . COLONOSCOPY WITH PROPOFOL N/A 12/22/2017   Procedure: COLONOSCOPY WITH PROPOFOL;  Surgeon: Virgel Manifold, MD;  Location: [facility] ENDOSCOPY;  Service: Endoscopy;  Laterality: N/A;  . CYSTOSCOPY WITH INSERTION OF UROLIFT    . Laminectomy/Discectomy  11/03/2005   L5/S1  . Malignant melanoma excision  06/20/2003  Dr. Nehemiah Massed  . MRI  10/09/2005   Lumbar spine, bulging disc L5/S1  . Right lobectomy  1989   Lung cancer  . RIGHT/LEFT HEART CATH AND CORONARY ANGIOGRAPHY Bilateral 09/06/2019   Procedure: RIGHT/LEFT HEART CATH AND CORONARY ANGIOGRAPHY;  Surgeon: Minna Merritts, MD;  Location: [facility];  Service: Cardiovascular;   Laterality: Bilateral;  . TONSILLECTOMY AND ADENOIDECTOMY      Family History  Problem Relation Age of Onset  . Cancer Mother        [place] throat  . Alcohol abuse Mother   . Cancer Father        4 kinds; 5 places, prostate and lung  . Prostate cancer Father        possible prostate cancer  . Cancer Other        Skin, deceased in 03/26/1994  . Colon cancer Neg Hx        none known    Social History   Socioeconomic History  . Marital status: Married    Spouse name: Tye [place]  . Number of children: 2  . Years of education: Not on file  . Highest education level: Not on file  Occupational History  . 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 Smoking status: Former Smoker    Packs/day: 2.00    Years: 17.00    Pack years: 34.00    Types: Cigarettes    Quit date: 02/22/1982    Years since quitting: 37.6  . Smokeless tobacco: Never Used  Vaping Use  . Vaping Use: Never used  Substance and Sexual Activity  . Alcohol use: Yes    Alcohol/week: 10.0 standard drinks    Types: 10 Glasses of wine per week    Comment: wine  . Drug use: No  . Sexual activity: Yes  Other Topics Concern  . 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    Married 40+ years   Social Determinants of Radio broadcast assistant Strain:   . Difficulty of Paying Living Expenses: Not on file  Food Insecurity:   . Worried About Charity fundraiser in the Last Year: Not on file  . Ran Out of Food in the Last Year: Not on file  Transportation Needs:   . Lack of Transportation (Medical): Not on file  . Lack of Transportation (Non-Medical): Not on file  Physical Activity:   . Days of Exercise per Week: Not on file  . Minutes of Exercise per Session: Not on file  Stress:   . Feeling of Stress : Not on file  Social Connections:   . Frequency of Communication with Friends and  Family: Not on file  . Frequency of Social Gatherings with Friends and Family: Not on file  . Attends Religious Services: Not on file  . Active Member of Clubs or Organizations: Not on file  . Attends Archivist Meetings: Not on file  . Marital Status: Not on file  Intimate Partner Violence:   . Fear of Current or Ex-Partner: Not on file  . Emotionally Abused: Not on file  . Physically Abused: Not on file  . Sexually Abused: Not on file    Current Outpatient Medications  Medication Sig Dispense Refill  . folic acid (FOLVITE) 1 MG tablet Take 1  mg by mouth daily.    Marland Kitchen losartan (COZAAR) 25 MG tablet Take 1 tablet (25 mg total) by mouth daily. 90 tablet 3  . methotrexate 2.5 MG tablet Take 10 mg by mouth once a week.     Vladimir Faster Glycol-Propyl Glycol (SYSTANE ULTRA) 0.4-0.3 % SOLN Place 1 drop into both eyes daily as needed (Dry eye).    . Skin Protectants, Misc. (EUCERIN) cream Apply 1 application topically daily as needed for dry skin.     No current facility-administered medications for this visit.    Allergies  Allergen Reactions  . Alfuzosin     Skin rash, burning sensation of skin  . Cialis [Tadalafil] Other (See Comments)    heartburn  . Doxazosin     Skin rash, burning sensation of skin  . Ephedrine Other (See Comments)    BP spikes   . Finasteride     diarrhea  . Flomax [Tamsulosin Hcl]     Rash  . Rapaflo [Silodosin]     rash  . Sulfonamide Derivatives     REACTION: as child      Review of Systems:   General:  normal appetite, decreased energy, no weight gain, no weight loss, no fever  Cardiac:  no chest pain with exertion, no chest pain at rest, +SOB with exertion, no resting SOB, no PND, no orthopnea, no palpitations, no arrhythmia, no atrial fibrillation, no LE edema, + dizzy spells, no syncope  Respiratory:  no shortness of breath, no home oxygen, no productive cough, + chronic dry cough, no bronchitis, no wheezing, no hemoptysis, no asthma,  no pain with inspiration or cough, no sleep apnea, no CPAP at night  GI:   no difficulty swallowing, no reflux, no frequent heartburn, no hiatal hernia, no abdominal pain, no constipation, no diarrhea, no hematochezia, no hematemesis, no melena  GU:   no dysuria,  + frequency, no urinary tract infection, no hematuria, no enlarged prostate, no kidney stones, no kidney disease  Vascular:  no pain suggestive of claudication, no pain in feet, no leg cramps, no varicose veins, no DVT, no non-healing foot ulcer  Neuro:   no stroke, no TIA's, no seizures, no headaches, no temporary blindness one eye,  no slurred speech, + peripheral neuropathy, no chronic pain, no instability of gait, mild memory/cognitive dysfunction  Musculoskeletal: no arthritis, no joint swelling, no myalgias, no difficulty walking, normal mobility   Skin:   + chronic skin condition on methotrexate, no rash, no itching, no skin infections, no pressure sores or ulcerations  Psych:   no anxiety, no depression, no nervousness, no unusual recent stress  Eyes:   no blurry vision, + floaters, no recent vision changes, does not wear glasses or contacts  ENT:   no hearing loss, no loose or painful teeth, no dentures, last saw dentist March 2021  Hematologic:  + easy bruising, no abnormal bleeding, no clotting disorder, no frequent epistaxis  Endocrine:  no diabetes, does not check CBG's at home           Physical Exam:   BP (!) 170/72   Pulse 74   Temp 97.8 F (36.6 C) (Skin)   Resp 20   Ht 5' 8.5" (1.74 m)   Wt 160 lb (72.6 kg)   SpO2 95% Comment: RA  BMI 23.97 kg/m   General:  Thin,  well-appearing  HEENT:  Unremarkable   Neck:   no JVD, no bruits, no adenopathy   Chest:   clear to auscultation,  symmetrical breath sounds, no wheezes, no rhonchi   CV:   RRR, grade IV/VI crescendo/decrescendo murmur heard best at LLSB,  no diastolic murmur  Abdomen:  soft, non-tender, no masses   Extremities:  warm, well-perfused, pulses  palpable, no LE edema  Rectal/GU  Deferred  Neuro:   Grossly non-focal and symmetrical throughout  Skin:   Clean and dry, no rashes, no breakdown   Diagnostic Tests:  ECHOCARDIOGRAM REPORT       Patient Name:  Brett Mcguire Date of Exam: 08/22/2019  Medical Rec #: [id number]    Height:    68.5 in  Accession #:  [id number]   Weight:    165.0 lb  Date of Birth: 1945-09-06   BSA:     1.894 m  Patient Age:  7 years    BP:      138/74 mmHg  Patient Gender: M        HR:      64 bpm.  Exam Location: [facility]   Procedure: 2D Echo, Cardiac Doppler and Color Doppler                  MODIFIED REPORT:   This report was modified by Kate Sable MD on 08/22/2019 due to  Aov                hemodynamics missing.  Indications:   I35.0 Nonrheumatic aortic (valve) stenosis    History:     Patient has prior history of Echocardiogram examinations,  most          recent 06/08/2017. Carotid Disease, Aortic Valve Disease,          Signs/Symptoms:Shortness of Breath and Fatigue; Risk          Factors:Hypertension, Dyslipidemia and Former Smoker.    Sonographer:   Pilar Jarvis RDMS, RVT, RDCS  Referring Phys: [place]  Diagnosing Phys: Kate Sable MD     Sonographer Comments: Very limited acoustic windows  Patient eventually became intolerant to transducer pressure   Supine 5-chamber AV Doppler envelope was suboptimal but was considered  acceptable  IMPRESSIONS    1. Left ventricular ejection fraction, by estimation, is 60 to 65%. The  left ventricle has normal function. The left ventricle has no regional  wall motion abnormalities. There is mild left ventricular hypertrophy.  Left ventricular diastolic parameters  are consistent with Grade I diastolic dysfunction (impaired relaxation).  2. Right ventricular systolic function is normal. The  right ventricular  size is normal.  3. The mitral valve is normal in structure. Trivial mitral valve  regurgitation.  4. Ao valve Vmax 407cm/s, PG 62mmHg, Mean gradient 84mmHg, AVA per VTI  1.2cm2. The aortic valve has an indeterminant number of cusps. Aortic  valve regurgitation is not visualized. Severe aortic valve stenosis.  5. The inferior vena cava is normal in size with greater than 50%  respiratory variability, suggesting right atrial pressure of 3 mmHg.   FINDINGS  Left Ventricle: Left ventricular ejection fraction, by estimation, is 60  to 65%. The left ventricle has normal function. The left ventricle has no  regional wall motion abnormalities. The left ventricular internal cavity  size was normal in size. There is  mild left ventricular hypertrophy. Left ventricular diastolic parameters  are consistent with Grade I diastolic dysfunction (impaired relaxation).   Right Ventricle: The right ventricular size is normal. No increase in  right ventricular wall thickness. Right ventricular systolic function is  normal.   Left Atrium: Left  atrial size was normal in size.   Right Atrium: Right atrial size was normal in size.   Pericardium: There is no evidence of pericardial effusion.   Mitral Valve: The mitral valve is normal in structure. Trivial mitral  valve regurgitation.   Tricuspid Valve: The tricuspid valve is normal in structure. Tricuspid  valve regurgitation is not demonstrated.   Aortic Valve: Ao valve Vmax 407cm/s, PG 31mmHg, Mean gradient 38mmHg, AVA  per VTI 1.2cm2. The aortic valve has an indeterminant number of cusps.  Aortic valve regurgitation is not visualized. Severe aortic stenosis is  present. Aortic valve mean gradient  measures 43.3 mmHg. Aortic valve peak gradient measures 82.1 mmHg. Aortic  valve area, by VTI measures 2.98 cm.   Pulmonic Valve: The pulmonic valve was not well visualized. Pulmonic valve  regurgitation is not visualized.    Aorta: The aortic root is normal in size and structure.   Venous: The inferior vena cava is normal in size with greater than 50%  respiratory variability, suggesting right atrial pressure of 3 mmHg.   IAS/Shunts: No atrial level shunt detected by color flow Doppler.     LEFT VENTRICLE  PLAX 2D  LVIDd:     4.00 cm   Diastology  LVIDs:     2.50 cm   LV e' lateral:  5.98 cm/s  LV PW:     1.60 cm   LV E/e' lateral: 9.8  LV IVS:    1.70 cm   LV e' medial:  4.46 cm/s  LVOT diam:   2.40 cm   LV E/e' medial: 13.2  LV SV:     287  LV SV Index:  151  LVOT Area:   4.52 cm    LV Volumes (MOD)  LV vol d, MOD A2C: 94.1 ml  LV vol d, MOD A4C: 87.9 ml  LV vol s, MOD A2C: 41.1 ml  LV vol s, MOD A4C: 40.6 ml  LV SV MOD A2C:   53.0 ml  LV SV MOD A4C:   87.9 ml  LV SV MOD BP:   52.0 ml   RIGHT VENTRICLE       IVC  RV S prime:   13.40 cm/s IVC diam: 1.60 cm  TAPSE (M-mode): 2.1 cm   LEFT ATRIUM       Index  LA diam:    3.70 cm 1.95 cm/m  LA Vol (A2C):  48.1 ml 25.40 ml/m  LA Vol (A4C):  47.2 ml 24.93 ml/m  LA Biplane Vol: 49.9 ml 26.35 ml/m  AORTIC VALVE          PULMONIC VALVE  AV Area (Vmax):  2.55 cm   PV Vmax:    0.76 m/s  AV Area (Vmean):  2.82 cm   PV Peak grad: 2.3 mmHg  AV Area (VTI):   2.98 cm  AV Vmax:      453.00 cm/s  AV Vmean:     306.667 cm/s  AV VTI:      0.960 m  AV Peak Grad:   82.1 mmHg  AV Mean Grad:   43.3 mmHg  LVOT Vmax:     255.50 cm/s  LVOT Vmean:    191.500 cm/s  LVOT VTI:     0.634 m  LVOT/AV VTI ratio: 0.66    AORTA  Ao Root diam: 3.05 cm  Ao Asc diam: 2.60 cm  Ao Arch diam: 2.7 cm   MITRAL VALVE  MV Area (PHT): 1.82 cm   SHUNTS  MV Decel  Time: 416 msec   Systemic VTI: 0.63 m  MV E velocity: 58.80 cm/s  Systemic Diam: 2.40 cm  MV A velocity: 103.00 cm/s  MV E/A ratio: 0.57   Kate Sable MD   Electronically signed by Kate Sable MD  Signature Date/Time: 08/22/2019/3:47:35 PM      RIGHT/LEFT HEART CATH AND CORONARY ANGIOGRAPHY  Indications  Shortness of breath on exertion [R06.02 (ICD-10-CM)]  Procedural Details  Technical Details Cardiac Catheterization Procedure Note  Name: MACHI WHITTAKER MRN: [id number] DOB: 1945/11/15  Procedure: Left Heart Cath, Selective Coronary Angiography, LV angiography, right heart catheterization  Indication:   Brett Mcguire is a very pleasant 74 year old gentleman with prior history of  smoking for 17 years,  17 years of secondhand exposure per the patient,  lung cancer with resection of the right upper lobe in 1989,  quit smoking in 1984,  chronic mild shortness of breath  Echo: Ao valve Vmax 407cm/s, PG 48mmHg, Mean gradient 34mmHg, AVA per VTI  1.2cm2. The aortic valve has an indeterminant number of cusps. Aortic  valve regurgitation is not visualized. Severe aortic valve stenosis.  Cardiac catheterization scheduled for preop evaluation for aortic valve disease, shortness of breath/unstable angina  Procedural details: The right groin was prepped, draped, and anesthetized with 1% lidocaine. Using modified Seldinger technique, a 5 French sheath was introduced into the right femoral artery.  7 French introducer sheath placed into the right femoral vein, Swan-Ganz catheter used to obtain right heart pressures.  standard Judkins catheters (JL 4, JR 4 and pigtail catheter) were used for coronary angiography and left ventriculography. Catheter exchanges were performed over a guidewire. There were no immediate procedural complications. The patient was transferred to the post catheterization recovery area for further monitoring.  Moderate sedation: 1. Sedation used: 1 mg Versed, 50 mcg fentanyl 2. Time of administration:       10 AM     Time patient left for recovery 10:40 AM Total sedation time 40 minutes 3. I was Face to Face with the  patient during this time: (code: [phone])   Procedural Findings:  Hemodynamics:     AO 141/61/91, additional measurements obtained later in the case with peak systolic pressure 176    LV 219/3/14   Coronary angiography:  Coronary dominance: Right  Left mainstem:   Large vessel that bifurcates into the LAD and left circumflex, no significant disease noted  Left anterior descending (LAD):   Large vessel that extends to the apical region, diagonal branch 2 of moderate size, no significant disease noted  Left circumflex (LCx):  Large vessel with OM branch 2, no significant disease noted  Right coronary artery (RCA):  Right dominant vessel with PL and PDA, no significant disease noted  Left ventriculography: Aortic valve crossed for pressures,  significant aortic valve stenosis Pressure across aortic valve 50+ mm Hg,  ectopy appreciated LV gram not performed given recent echocardiogram results available  Right heart pressures RA 2/2 RV 17/-1/3 PA 19/1/8 Wedge 5/3/1 Cardiac output 5.34 Cardiac index 2.85    Final Conclusions:   No significant coronary artery disease Severe aortic valve stenosis Valve gradient likely between 50 and 60 mmHg, peak to peak  Recommendations:  We will place referral to TAVR team in [facility] for consideration of surgery for his aortic valve, could consider noninvasive options such as TAVR --Close monitoring of blood pressure as outpatient  Ida Rogue 09/06/2019, 10:59 AM  Estimated blood loss <50 mL.   During this procedure medications were administered to achieve  and maintain moderate conscious sedation while the patient's heart rate, blood pressure, and oxygen saturation were continuously monitored and I was present face-to-face 100% of this time.  Medications (Filter: Administrations occurring from 1006 to 1106 on 09/06/19) (important) Continuous medications are totaled by the amount administered until 09/06/19 1106.  fentaNYL  (SUBLIMAZE) injection (mcg) Total dose:  50 mcg Date/Time  Rate/Dose/Volume Action  09/06/19 1010  50 mcg Given    midazolam (VERSED) injection (mg) Total dose:  1 mg Date/Time  Rate/Dose/Volume Action  09/06/19 1010  1 mg Given    iohexol (OMNIPAQUE) 300 MG/ML solution (mL) Total volume:  80 mL Date/Time  Rate/Dose/Volume Action  09/06/19 1059  80 mL Given    Sedation Time  Sedation Time Physician-1: 38 minutes 4 seconds  Contrast  Medication Name Total Dose  iohexol (OMNIPAQUE) 300 MG/ML solution 80 mL    Radiation/Fluoro  Fluoro time: 4.5 (min) DAP: 17.5 (Gycm2) Cumulative Air Kerma: 217 (mGy)  Coronary Findings  Diagnostic Dominance: Right No diagnostic findings have been documented. Intervention  No interventions have been documented. Coronary Diagrams  Diagnostic Dominance: Right  Intervention  Implants   Vascular Products  Device Closure Mynxgrip 71f [phone] - Implanted Inventory item: DEVICE CLOSURE MYNXGRIP 63F Model/Cat number: [id number]  Manufacturer: [employer] Lot number: [id number]  Device identifier: [id number] Device identifier type: GS1  GUDID Information  Request status Successful    Brand name: [facility] Version/Model: VV6160  Company name: [employer] MRI safety info as of 09/06/19: MR Safe  Contains dry or latex rubber: No    GMDN P.T. name: Wound hydrogel dressing, non-antimicrobial    As of 09/06/2019  Status: Implanted      Syngo Images  Show images for CARDIAC CATHETERIZATION Images on Long Term Storage  Show images for [place], [place] to Procedure Log  Procedure Log    Hemo Data (last day) before discharge   AO Systolic Cath Pressure  AO Diastolic Cath Pressure  AO Mean Cath Pressure  LV Systolic Cath Pressure  LV End Diastolic  LV Systolic  LV End Diastolic  LV dP/dt  PA Systolic Cath Pressure  PA Diastolic Cath Pressure  PA Mean Cath Pressure  RA Wedge A Wave  RA Wedge V Wave  RV Systolic Cath  Pressure  RV Diastolic Cath Pressure  RV End Diastolic  RV Systolic  RV End Diastolic  RV dP/dt  PCW A Wave  PCW V Wave  PCW Mean  AO O2 Sat  PA O2 Sat  AO O2 Sat  Fick C.O.  Fick C.I.   --  --  --  --  --  216 mmHg  14 mmHg  1872 mmHg/sec  --  --  --  2 mmHg  2 mmHg  --  --  --  17 mmHg  3 mmHg  240 mmHg/sec  4 mmHg  2 mmHg  1 mmHg  --  --  --  5.34 L/min  2.85 L/min/m2   --  --  --  --  --  --  --  --  19 mmHg  1 mmHg  8 mmHg  --  --  --  --  --  --  --  --  --  --  --  --  --  --  --  --   --  --  --  --  --  --  --  --  --  --  --  --  --  17 mmHg  -1  mmHg  3 mmHg  --  --  --  --  --  --  --  --  --  --  --   --  --  --  --  --  --  --  --  --  --  --  --  --  --  --  --  --  --  --  --  --  --  94.8 %  --  SA  --  --   --  --  --  --  --  --  --  --  --  --  --  --  --  --  --  --  --  --  --  --  --  --  --  PA  --  --  --   141  61 mmHg  91 mmHg  --  --  --  --  --  --  --  --  --  --  --  --  --  --  --  --  --  --  --  --  --  --  --  --   --  --  --  223 mmHg  16 mmHg  --  --  --  --  --  --  --  --  --  --  --  --  --  --  --  --  --  --  --  --  --  --   --  --  --  227 mmHg  16 mmHg  --  --  --  --  --  --  --  --  --  --  --  --  --  --  --  --  --  --  --  --  --  --   --  --  --  209 mmHg  16 mmHg  --  --  --  --  --  --  --  --  --  --  --  --  --  --  --  --  --  --  --  --  --  --   --  --  --  219 mmHg  14 mmHg  --  --  --  --  --  --  --  --  --  --  --  --  --  --  --  --  --  --  --  --  --  --   170  72 mmHg  112 mmHg                                                     EKG: NSR w/out significant AV conduction delay (08/31/2019)     Cardiac TAVR CT  TECHNIQUE: The patient was scanned on a Siemens Force 381 slice scanner. A 120 kV retrospective scan was triggered in the descending thoracic aorta at 111 HU's. Gantry rotation speed was 270 msecs and collimation was .9 mm. No beta blockade or nitro were given. The 3D data set was reconstructed in 5% intervals of the R-R  cycle. Systolic and diastolic phases were analyzed on a dedicated work station using MPR, MIP and VRT modes. The patient received 80 cc of contrast.  FINDINGS: Aortic Valve: Tri leaflet with heavily calcified non coronary cusp and restricted motion  Aorta: Normal  aortic root with no aneurysm Normal arch vessels no Coarctation and moderate calcific atherosclerosis  Sinotubular Junction: 26 mm  Ascending Thoracic Aorta: 34 mm  Aortic Arch: 28 mm  Descending Thoracic Aorta: 26 mm  Sinus of Valsalva Measurements:  Non-coronary: 33.4 mm  Right - coronary: 30.6 mm  Left - coronary: 34.8 mm  Coronary Artery Height above Annulus:  Left Main: 13.9 mm above annulus  Right Coronary: 16.8 mm above annulus  Virtual Basal Annulus Measurements:  Maximum/Minimum Diameter: 28.8 mm x 23.8 mm  Perimeter: 83.2 mm cc  Area: 531 mm 2  Coronary Arteries: Sufficient height above annulus for deployment  Optimum Fluoroscopic Angle for Delivery: LAO 1 Caudal 3 degrees  IMPRESSION: 1. Tri leaflet AV with calcium score of 3591 and annular area of 531 which is upper range limit for 26 mm Sapien 3 valve  2.  Coronary arteries sufficient height above annulus for deployment  3.  Optimum angiographic angle for deployment LAO 1 Caudal 3 degrees  4.  Normal ascending aortic root 3.4 cm  Jenkins Rouge   Electronically Signed   By: Jenkins Rouge M.D.   On: 09/18/2019 12:26    CT ANGIOGRAPHY CHEST, ABDOMEN AND PELVIS  TECHNIQUE: Non-contrast CT of the chest was initially obtained.  Multidetector CT imaging through the chest, abdomen and pelvis was performed using the standard protocol during bolus administration of intravenous contrast. Multiplanar reconstructed images and MIPs were obtained and reviewed to evaluate the vascular anatomy.  CONTRAST:  171mL OMNIPAQUE IOHEXOL 350 MG/ML SOLN  COMPARISON:  CT of the chest, abdomen and pelvis  02/21/2019.  FINDINGS: CTA CHEST FINDINGS  Cardiovascular: Heart size is borderline enlarged with severe concentric left ventricular hypertrophy. There is no significant pericardial fluid, thickening or pericardial calcification. There is aortic atherosclerosis, as well as atherosclerosis of the great vessels of the mediastinum and the coronary arteries, including calcified atherosclerotic plaque in the left main, left anterior descending, left circumflex and right coronary arteries. Severe thickening calcification of the aortic valve.  Mediastinum/Lymph Nodes: No pathologically enlarged mediastinal or hilar lymph nodes. There are no aggressive appearing lytic or blastic lesions noted in the visualized portions of the skeleton. No axillary lymphadenopathy.  Lungs/Pleura: Status post right upper lobectomy with compensatory hyperexpansion of the right middle and lower lobes. Mild scattered scarring in the right lung, most evident basal segments of the right lower lobe. No acute consolidative airspace disease. No pleural effusions. A few scattered tiny pulmonary nodules are noted in the lungs bilaterally measuring 3 mm or less in size, similar to prior examinations. Diffuse bronchial wall thickening with mild centrilobular and paraseptal emphysema  Musculoskeletal/Soft Tissues: There are no aggressive appearing lytic or blastic lesions noted in the visualized portions of the skeleton.  CTA ABDOMEN AND PELVIS FINDINGS  Hepatobiliary: Well-defined 3.9 x 3.2 cm low-attenuation lesion in segment 7 of the liver, compatible with a simple cyst. No suspicious hepatic lesions. No intra or extrahepatic biliary ductal dilatation. Gallbladder is normal in appearance.  Pancreas: No pancreatic mass. No pancreatic ductal dilatation. No pancreatic or peripancreatic fluid collections or inflammatory changes.  Spleen: Unremarkable.  Adrenals/Urinary Tract: Low-attenuation lesions in  both kidneys compatible with simple cysts, largest of which is exophytic in the lower pole of the left kidney measuring 3.3 cm in diameter. Other subcentimeter low-attenuation lesions in both kidneys, too small to definitively characterize, but statistically likely to represent tiny cysts. Bilateral adrenal glands are normal in appearance. No hydroureteronephrosis. Urinary bladder is unremarkable in appearance.  Stomach/Bowel: Normal appearance of the stomach. No pathologic dilatation of small bowel or colon. Numerous colonic diverticulae are noted, particularly in the descending colon and sigmoid colon, without surrounding inflammatory changes to suggest an acute diverticulitis at this time. Normal appendix.  Vascular/Lymphatic: Aortic atherosclerosis, without evidence of aneurysm or dissection in the abdominal or pelvic vasculature. Vascular findings and measurements pertinent to potential TAVR procedure, as detailed below. No lymphadenopathy noted in the abdomen or pelvis.  Reproductive: Brachytherapy implants within the prostate gland. Seminal vesicles are unremarkable in appearance.  Other: No significant volume of ascites.  No pneumoperitoneum.  Musculoskeletal: There are no aggressive appearing lytic or blastic lesions noted in the visualized portions of the skeleton.  VASCULAR MEASUREMENTS PERTINENT TO TAVR:  AORTA:  Minimal Aortic Diameter-15 x 14 mm  Severity of Aortic Calcification-moderate  RIGHT PELVIS:  Right Common Iliac Artery -  Minimal Diameter-7.9 x 9.6 mm  Tortuosity-mild-to-moderate  Calcification-mild-to-moderate  Right External Iliac Artery -  Minimal Diameter-8.3 x 7.9 mm  Tortuosity-severe  Calcification-mild  Right Common Femoral Artery -  Minimal Diameter-9.2 x 8.8 mm  Tortuosity-mild  Calcification-mild  LEFT PELVIS:  Left Common Iliac Artery -  Minimal Diameter-9.9 x 8.5  mm  Tortuosity-mild  Calcification-mild  Left External Iliac Artery -  Minimal Diameter-8.1 x 6.8 mm  Tortuosity-moderate to severe  Calcification-mild  Left Common Femoral Artery -  Minimal Diameter-8.2 x 7.4 mm  Tortuosity-mild  Calcification-moderate  Review of the MIP images confirms the above findings.  IMPRESSION: 1. Vascular findings and measurements pertinent to potential TAVR procedure, as detailed above. 2. Severe thickening calcification of the aortic valve, compatible with the reported clinical history of severe aortic stenosis. 3. Aortic atherosclerosis, in addition to left main and 3 vessel coronary artery disease. 4. Tiny pulmonary nodules measuring 3 mm or less in size, stable compared to the prior study from February 2021, strongly favored to be benign. No follow-up needed if patient is low-risk (and has no known or suspected primary neoplasm). Non-contrast chest CT can be considered in 12 months if patient is high-risk. This recommendation follows the consensus statement: Guidelines for Management of Incidental Pulmonary Nodules Detected on CT Images: From the Fleischner Society 2017; Radiology 2017; 284:228-243. 5. Colonic diverticulosis without evidence of acute diverticulitis at this time. 6. Additional incidental findings, as above.   Electronically Signed   By: Vinnie Langton M.D.   On: 09/18/2019 10:53   Impression:  Patient has stage D1 severe symptomatic aortic stenosis.  He describes gradual progression of symptoms of exertional shortness of breath and fatigue consistent with chronic diastolic congestive heart failure, [place] Heart Association functional class II.  I have personally reviewed the patient's recent transthoracic echocardiogram, diagnostic cardiac catheterization, CT angiograms, and EKG.  Echocardiogram reveals normal left ventricular systolic function with severe aortic stenosis.  There is severe  thickening, calcification, and restricted leaflet mobility involving all 3 leaflets of the patient's aortic valve.  Peak velocity across aortic valve measured between 4.1 and 4.3 m/s corresponding to mean transvalvular gradient estimated greater than 40 mmHg.  Left ventricular function appears normal with ejection fraction estimated 60 to 65%.  Diagnostic cardiac catheterization confirmed the presence of severe aortic stenosis and revealed mild nonobstructive coronary artery disease.  Right heart pressures were normal.  Cardiac-gated CTA of the heart reveals anatomical characteristics consistent with aortic stenosis suitable for treatment by transcatheter aortic valve replacement without any significant complicating features and CTA of the aorta and iliac vessels demonstrate what appears to be  adequate pelvic vascular access to facilitate a transfemoral approach.  EKG reveals sinus rhythm without significant AV conduction delay.  CT angiogram of the chest also revealed tiny pulmonary nodules measuring 3 mm or less which appear benign but probably require follow-up given the patient's history of lung cancer in the remote past.    Plan:  The patient and his wife were counseled at length regarding treatment alternatives for management of severe symptomatic aortic stenosis. Alternative approaches such as conventional aortic valve replacement, transcatheter aortic valve replacement, and continued medical therapy without intervention were compared and contrasted at length.  The risks associated with conventional surgical aortic valve replacement were discussed in detail, as were expectations for post-operative convalescence, alternative surgical approaches, prosthetic valve choices, and the presence or absence of other concomitant conditions which might require intervention.  Issues specific to transcatheter aortic valve replacement were discussed including questions about long term valve durability, the potential  for paravalvular leak, possible increased risk of need for permanent pacemaker placement, the suitability of the patient's surgical anatomy, and other potential technical complications related to the procedure itself.  Long-term prognosis without surgical intervention was discussed.  All treatment options were discussed in the context of the patient's own specific clinical presentation, past medical history, long-term prognosis and life expectancy.  All of their questions have been addressed.  The patient desires to proceed with transcatheter aortic valve replacement as soon as practical.  We tentatively plan for surgery on October 02, 2019.  The patient and his wife were also notified regarding the tiny lung nodule seen on CT imaging and the recommendation that he undergo follow-up CT scan in 6 to 12 months.  Following the decision to proceed with transcatheter aortic valve replacement, a discussion has been held regarding what types of management strategies would be attempted intraoperatively in the event of life-threatening complications, including whether or not the patient would be considered a candidate for the use of cardiopulmonary bypass and/or conversion to open sternotomy for attempted surgical intervention.  The patient specifically requests that should a potentially life-threatening complication develop we would attempt emergency median sternotomy and/or other aggressive surgical procedures.  The patient has been advised of a variety of complications that might develop including but not limited to risks of death, stroke, paravalvular leak, aortic dissection or other major vascular complications, aortic annulus rupture, device embolization, cardiac rupture or perforation, mitral regurgitation, acute myocardial infarction, arrhythmia, heart block or bradycardia requiring permanent pacemaker placement, congestive heart failure, respiratory failure, renal failure, pneumonia, infection, other late  complications related to structural valve deterioration or migration, or other complications that might ultimately cause a temporary or permanent loss of functional independence or other long term morbidity.  The patient provides full informed consent for the procedure as described and all questions were answered.    I spent in excess of 90 minutes during the conduct of this office consultation and >50% of this time involved direct face-to-face encounter with the patient for counseling and/or coordination of their care.     Valentina Gu. Roxy Manns, MD 09/26/2019 10:59 AM

## 2019-09-26 NOTE — H&P (View-Only) (Signed)
HEART AND [facility] SURGERY CONSULTATION REPORT  Primary Cardiologist is Ida Rogue, MD PCP is Tonia Ghent, MD  Chief Complaint  Patient presents with  . Aortic Stenosis    Surgical eval for TAVR, review all testing    HPI:  Patient is 74 year old male with history of aortic stenosis, hypertension, hyperlipidemia, remote history of lung cancer status post right upper lobectomy in 1989, remote history of tobacco use, and chronic skin disorder on long-term methotrexate who has been referred for surgical consultation to discuss treatment options for management of severe symptomatic aortic stenosis.  Patient states that he was first discovered to have a heart murmur on physical exam approximately 10 years ago.  He was initially followed by his primary care physician and later referred to Dr. [name] who has been following him regularly ever since.  Over the last several years the patient has experienced a gradual decline in his exercise tolerance with progressive exertional shortness of breath and increased fatigue.  Recent follow-up transthoracic echocardiogram performed August 22, 2019 revealed significant progression of disease and severe aortic stenosis.  Peak velocity across the aortic valve measured as high as 4.3 m/s corresponding to mean transvalvular gradient estimated 43 mmHg.  Left ventricular systolic function appeared normal with ejection fraction estimated 60 to 65%.  Diagnostic cardiac catheterization was performed by Dr. [name] September 06, 2019 and revealed normal coronary artery anatomy with no significant coronary artery disease.  Peak to peak and mean transvalvular gradients across the aortic valve were reported 66 and 41 mmHg respectively.  Pulmonary artery pressures were normal.  The patient was referred to the multidisciplinary heart valve clinic and has been evaluated previously by Dr. Burt Knack.  CT angiography  was performed and the patient was referred for surgical consultation.  Patient is married and lives near [place] on [place] acres of property with his wife.  He has been retired for many years having previously worked in [employer] for a book [place].  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  The patient has remained physically active all of his adult life.  He complains of a gradual progression of exertional shortness of breath and fatigue over the last several years.  He now gets short of breath and tired quite easily walking up an incline.  He does better on flat ground.  He denies resting shortness of breath, PND, orthopnea, or lower extremity edema.  He has had occasional mild dizzy spells without any history of syncope.  Past Medical History:  Diagnosis Date  . Allergy 1989  . 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 Carotid artery disease ([place])   . Diverticulosis 02/2000  . Hyperlipidemia 1989  . Hypertension   . Lung cancer ([place]) 1989   s/p lobectomy, no chemo or rady  . Melanoma of skin ([place])    chest  . Severe aortic stenosis     Past Surgical History:  Procedure Laterality Date  . COLONOSCOPY  02/18/2000   Polyp, diverticulosis, repeat in 5 years  . COLONOSCOPY  03/03/2006   Repeat  -  Divertics  . COLONOSCOPY WITH PROPOFOL N/A 12/22/2017   Procedure: COLONOSCOPY WITH PROPOFOL;  Surgeon: Virgel Manifold, MD;  Location: [facility] ENDOSCOPY;  Service: Endoscopy;  Laterality: N/A;  . CYSTOSCOPY WITH INSERTION OF UROLIFT    . Laminectomy/Discectomy  11/03/2005   L5/S1  . Malignant melanoma excision  06/20/2003  Dr. Nehemiah Massed  . MRI  10/09/2005   Lumbar spine, bulging disc L5/S1  . Right lobectomy  1989   Lung cancer  . RIGHT/LEFT HEART CATH AND CORONARY ANGIOGRAPHY Bilateral 09/06/2019   Procedure: RIGHT/LEFT HEART CATH AND CORONARY ANGIOGRAPHY;  Surgeon: Minna Merritts, MD;  Location: [facility];  Service: Cardiovascular;   Laterality: Bilateral;  . TONSILLECTOMY AND ADENOIDECTOMY      Family History  Problem Relation Age of Onset  . Cancer Mother        [place] throat  . Alcohol abuse Mother   . Cancer Father        4 kinds; 5 places, prostate and lung  . Prostate cancer Father        possible prostate cancer  . Cancer Other        Skin, deceased in 04-03-1994  . Colon cancer Neg Hx        none known    Social History   Socioeconomic History  . Marital status: Married    Spouse name: Tye [place]  . Number of children: 2  . Years of education: Not on file  . Highest education level: Not on file  Occupational History  . 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 Smoking status: Former Smoker    Packs/day: 2.00    Years: 17.00    Pack years: 34.00    Types: Cigarettes    Quit date: 02/22/1982    Years since quitting: 37.6  . Smokeless tobacco: Never Used  Vaping Use  . Vaping Use: Never used  Substance and Sexual Activity  . Alcohol use: Yes    Alcohol/week: 10.0 standard drinks    Types: 10 Glasses of wine per week    Comment: wine  . Drug use: No  . Sexual activity: Yes  Other Topics Concern  . 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    Married 40+ years   Social Determinants of Radio broadcast assistant Strain:   . Difficulty of Paying Living Expenses: Not on file  Food Insecurity:   . Worried About Charity fundraiser in the Last Year: Not on file  . Ran Out of Food in the Last Year: Not on file  Transportation Needs:   . Lack of Transportation (Medical): Not on file  . Lack of Transportation (Non-Medical): Not on file  Physical Activity:   . Days of Exercise per Week: Not on file  . Minutes of Exercise per Session: Not on file  Stress:   . Feeling of Stress : Not on file  Social Connections:   . Frequency of Communication with Friends and  Family: Not on file  . Frequency of Social Gatherings with Friends and Family: Not on file  . Attends Religious Services: Not on file  . Active Member of Clubs or Organizations: Not on file  . Attends Archivist Meetings: Not on file  . Marital Status: Not on file  Intimate Partner Violence:   . Fear of Current or Ex-Partner: Not on file  . Emotionally Abused: Not on file  . Physically Abused: Not on file  . Sexually Abused: Not on file    Current Outpatient Medications  Medication Sig Dispense Refill  . folic acid (FOLVITE) 1 MG tablet Take 1  mg by mouth daily.    Marland Kitchen losartan (COZAAR) 25 MG tablet Take 1 tablet (25 mg total) by mouth daily. 90 tablet 3  . methotrexate 2.5 MG tablet Take 10 mg by mouth once a week.     Vladimir Faster Glycol-Propyl Glycol (SYSTANE ULTRA) 0.4-0.3 % SOLN Place 1 drop into both eyes daily as needed (Dry eye).    . Skin Protectants, Misc. (EUCERIN) cream Apply 1 application topically daily as needed for dry skin.     No current facility-administered medications for this visit.    Allergies  Allergen Reactions  . Alfuzosin     Skin rash, burning sensation of skin  . Cialis [Tadalafil] Other (See Comments)    heartburn  . Doxazosin     Skin rash, burning sensation of skin  . Ephedrine Other (See Comments)    BP spikes   . Finasteride     diarrhea  . Flomax [Tamsulosin Hcl]     Rash  . Rapaflo [Silodosin]     rash  . Sulfonamide Derivatives     REACTION: as child      Review of Systems:   General:  normal appetite, decreased energy, no weight gain, no weight loss, no fever  Cardiac:  no chest pain with exertion, no chest pain at rest, +SOB with exertion, no resting SOB, no PND, no orthopnea, no palpitations, no arrhythmia, no atrial fibrillation, no LE edema, + dizzy spells, no syncope  Respiratory:  no shortness of breath, no home oxygen, no productive cough, + chronic dry cough, no bronchitis, no wheezing, no hemoptysis, no asthma,  no pain with inspiration or cough, no sleep apnea, no CPAP at night  GI:   no difficulty swallowing, no reflux, no frequent heartburn, no hiatal hernia, no abdominal pain, no constipation, no diarrhea, no hematochezia, no hematemesis, no melena  GU:   no dysuria,  + frequency, no urinary tract infection, no hematuria, no enlarged prostate, no kidney stones, no kidney disease  Vascular:  no pain suggestive of claudication, no pain in feet, no leg cramps, no varicose veins, no DVT, no non-healing foot ulcer  Neuro:   no stroke, no TIA's, no seizures, no headaches, no temporary blindness one eye,  no slurred speech, + peripheral neuropathy, no chronic pain, no instability of gait, mild memory/cognitive dysfunction  Musculoskeletal: no arthritis, no joint swelling, no myalgias, no difficulty walking, normal mobility   Skin:   + chronic skin condition on methotrexate, no rash, no itching, no skin infections, no pressure sores or ulcerations  Psych:   no anxiety, no depression, no nervousness, no unusual recent stress  Eyes:   no blurry vision, + floaters, no recent vision changes, does not wear glasses or contacts  ENT:   no hearing loss, no loose or painful teeth, no dentures, last saw dentist March 2021  Hematologic:  + easy bruising, no abnormal bleeding, no clotting disorder, no frequent epistaxis  Endocrine:  no diabetes, does not check CBG's at home           Physical Exam:   BP (!) 170/72   Pulse 74   Temp 97.8 F (36.6 C) (Skin)   Resp 20   Ht 5' 8.5" (1.74 m)   Wt 160 lb (72.6 kg)   SpO2 95% Comment: RA  BMI 23.97 kg/m   General:  Thin,  well-appearing  HEENT:  Unremarkable   Neck:   no JVD, no bruits, no adenopathy   Chest:   clear to auscultation,  symmetrical breath sounds, no wheezes, no rhonchi   CV:   RRR, grade IV/VI crescendo/decrescendo murmur heard best at LLSB,  no diastolic murmur  Abdomen:  soft, non-tender, no masses   Extremities:  warm, well-perfused, pulses  palpable, no LE edema  Rectal/GU  Deferred  Neuro:   Grossly non-focal and symmetrical throughout  Skin:   Clean and dry, no rashes, no breakdown   Diagnostic Tests:  ECHOCARDIOGRAM REPORT       Patient Name:  TAEDEN GELLER Date of Exam: 08/22/2019  Medical Rec #: [id number]    Height:    68.5 in  Accession #:  [id number]   Weight:    165.0 lb  Date of Birth: October 18, 1945   BSA:     1.894 m  Patient Age:  38 years    BP:      138/74 mmHg  Patient Gender: M        HR:      64 bpm.  Exam Location: [facility]   Procedure: 2D Echo, Cardiac Doppler and Color Doppler                  MODIFIED REPORT:   This report was modified by Kate Sable MD on 08/22/2019 due to  Aov                hemodynamics missing.  Indications:   I35.0 Nonrheumatic aortic (valve) stenosis    History:     Patient has prior history of Echocardiogram examinations,  most          recent 06/08/2017. Carotid Disease, Aortic Valve Disease,          Signs/Symptoms:Shortness of Breath and Fatigue; Risk          Factors:Hypertension, Dyslipidemia and Former Smoker.    Sonographer:   Pilar Jarvis RDMS, RVT, RDCS  Referring Phys: [place]  Diagnosing Phys: Kate Sable MD     Sonographer Comments: Very limited acoustic windows  Patient eventually became intolerant to transducer pressure   Supine 5-chamber AV Doppler envelope was suboptimal but was considered  acceptable  IMPRESSIONS    1. Left ventricular ejection fraction, by estimation, is 60 to 65%. The  left ventricle has normal function. The left ventricle has no regional  wall motion abnormalities. There is mild left ventricular hypertrophy.  Left ventricular diastolic parameters  are consistent with Grade I diastolic dysfunction (impaired relaxation).  2. Right ventricular systolic function is normal. The  right ventricular  size is normal.  3. The mitral valve is normal in structure. Trivial mitral valve  regurgitation.  4. Ao valve Vmax 407cm/s, PG 6mmHg, Mean gradient 77mmHg, AVA per VTI  1.2cm2. The aortic valve has an indeterminant number of cusps. Aortic  valve regurgitation is not visualized. Severe aortic valve stenosis.  5. The inferior vena cava is normal in size with greater than 50%  respiratory variability, suggesting right atrial pressure of 3 mmHg.   FINDINGS  Left Ventricle: Left ventricular ejection fraction, by estimation, is 60  to 65%. The left ventricle has normal function. The left ventricle has no  regional wall motion abnormalities. The left ventricular internal cavity  size was normal in size. There is  mild left ventricular hypertrophy. Left ventricular diastolic parameters  are consistent with Grade I diastolic dysfunction (impaired relaxation).   Right Ventricle: The right ventricular size is normal. No increase in  right ventricular wall thickness. Right ventricular systolic function is  normal.   Left Atrium: Left  atrial size was normal in size.   Right Atrium: Right atrial size was normal in size.   Pericardium: There is no evidence of pericardial effusion.   Mitral Valve: The mitral valve is normal in structure. Trivial mitral  valve regurgitation.   Tricuspid Valve: The tricuspid valve is normal in structure. Tricuspid  valve regurgitation is not demonstrated.   Aortic Valve: Ao valve Vmax 407cm/s, PG 20mmHg, Mean gradient 91mmHg, AVA  per VTI 1.2cm2. The aortic valve has an indeterminant number of cusps.  Aortic valve regurgitation is not visualized. Severe aortic stenosis is  present. Aortic valve mean gradient  measures 43.3 mmHg. Aortic valve peak gradient measures 82.1 mmHg. Aortic  valve area, by VTI measures 2.98 cm.   Pulmonic Valve: The pulmonic valve was not well visualized. Pulmonic valve  regurgitation is not visualized.    Aorta: The aortic root is normal in size and structure.   Venous: The inferior vena cava is normal in size with greater than 50%  respiratory variability, suggesting right atrial pressure of 3 mmHg.   IAS/Shunts: No atrial level shunt detected by color flow Doppler.     LEFT VENTRICLE  PLAX 2D  LVIDd:     4.00 cm   Diastology  LVIDs:     2.50 cm   LV e' lateral:  5.98 cm/s  LV PW:     1.60 cm   LV E/e' lateral: 9.8  LV IVS:    1.70 cm   LV e' medial:  4.46 cm/s  LVOT diam:   2.40 cm   LV E/e' medial: 13.2  LV SV:     287  LV SV Index:  151  LVOT Area:   4.52 cm    LV Volumes (MOD)  LV vol d, MOD A2C: 94.1 ml  LV vol d, MOD A4C: 87.9 ml  LV vol s, MOD A2C: 41.1 ml  LV vol s, MOD A4C: 40.6 ml  LV SV MOD A2C:   53.0 ml  LV SV MOD A4C:   87.9 ml  LV SV MOD BP:   52.0 ml   RIGHT VENTRICLE       IVC  RV S prime:   13.40 cm/s IVC diam: 1.60 cm  TAPSE (M-mode): 2.1 cm   LEFT ATRIUM       Index  LA diam:    3.70 cm 1.95 cm/m  LA Vol (A2C):  48.1 ml 25.40 ml/m  LA Vol (A4C):  47.2 ml 24.93 ml/m  LA Biplane Vol: 49.9 ml 26.35 ml/m  AORTIC VALVE          PULMONIC VALVE  AV Area (Vmax):  2.55 cm   PV Vmax:    0.76 m/s  AV Area (Vmean):  2.82 cm   PV Peak grad: 2.3 mmHg  AV Area (VTI):   2.98 cm  AV Vmax:      453.00 cm/s  AV Vmean:     306.667 cm/s  AV VTI:      0.960 m  AV Peak Grad:   82.1 mmHg  AV Mean Grad:   43.3 mmHg  LVOT Vmax:     255.50 cm/s  LVOT Vmean:    191.500 cm/s  LVOT VTI:     0.634 m  LVOT/AV VTI ratio: 0.66    AORTA  Ao Root diam: 3.05 cm  Ao Asc diam: 2.60 cm  Ao Arch diam: 2.7 cm   MITRAL VALVE  MV Area (PHT): 1.82 cm   SHUNTS  MV Decel  Time: 416 msec   Systemic VTI: 0.63 m  MV E velocity: 58.80 cm/s  Systemic Diam: 2.40 cm  MV A velocity: 103.00 cm/s  MV E/A ratio: 0.57   Kate Sable MD   Electronically signed by Kate Sable MD  Signature Date/Time: 08/22/2019/3:47:35 PM      RIGHT/LEFT HEART CATH AND CORONARY ANGIOGRAPHY  Indications  Shortness of breath on exertion [R06.02 (ICD-10-CM)]  Procedural Details  Technical Details Cardiac Catheterization Procedure Note  Name: JAHMARI ESBENSHADE MRN: [id number] DOB: Aug 10, 1945  Procedure: Left Heart Cath, Selective Coronary Angiography, LV angiography, right heart catheterization  Indication:   Mr. Huesman is a very pleasant 74 year old gentleman with prior history of  smoking for 17 years,  17 years of secondhand exposure per the patient,  lung cancer with resection of the right upper lobe in 1989,  quit smoking in 1984,  chronic mild shortness of breath  Echo: Ao valve Vmax 407cm/s, PG 48mmHg, Mean gradient 29mmHg, AVA per VTI  1.2cm2. The aortic valve has an indeterminant number of cusps. Aortic  valve regurgitation is not visualized. Severe aortic valve stenosis.  Cardiac catheterization scheduled for preop evaluation for aortic valve disease, shortness of breath/unstable angina  Procedural details: The right groin was prepped, draped, and anesthetized with 1% lidocaine. Using modified Seldinger technique, a 5 French sheath was introduced into the right femoral artery.  7 French introducer sheath placed into the right femoral vein, Swan-Ganz catheter used to obtain right heart pressures.  standard Judkins catheters (JL 4, JR 4 and pigtail catheter) were used for coronary angiography and left ventriculography. Catheter exchanges were performed over a guidewire. There were no immediate procedural complications. The patient was transferred to the post catheterization recovery area for further monitoring.  Moderate sedation: 1. Sedation used: 1 mg Versed, 50 mcg fentanyl 2. Time of administration:       10 AM     Time patient left for recovery 10:40 AM Total sedation time 40 minutes 3. I was Face to Face with the  patient during this time: (code: [phone])   Procedural Findings:  Hemodynamics:     AO 141/61/91, additional measurements obtained later in the case with peak systolic pressure 277    LV 219/3/14   Coronary angiography:  Coronary dominance: Right  Left mainstem:   Large vessel that bifurcates into the LAD and left circumflex, no significant disease noted  Left anterior descending (LAD):   Large vessel that extends to the apical region, diagonal branch 2 of moderate size, no significant disease noted  Left circumflex (LCx):  Large vessel with OM branch 2, no significant disease noted  Right coronary artery (RCA):  Right dominant vessel with PL and PDA, no significant disease noted  Left ventriculography: Aortic valve crossed for pressures,  significant aortic valve stenosis Pressure across aortic valve 50+ mm Hg,  ectopy appreciated LV gram not performed given recent echocardiogram results available  Right heart pressures RA 2/2 RV 17/-1/3 PA 19/1/8 Wedge 5/3/1 Cardiac output 5.34 Cardiac index 2.85    Final Conclusions:   No significant coronary artery disease Severe aortic valve stenosis Valve gradient likely between 50 and 60 mmHg, peak to peak  Recommendations:  We will place referral to TAVR team in [facility] for consideration of surgery for his aortic valve, could consider noninvasive options such as TAVR --Close monitoring of blood pressure as outpatient  Ida Rogue 09/06/2019, 10:59 AM  Estimated blood loss <50 mL.   During this procedure medications were administered to achieve  and maintain moderate conscious sedation while the patient's heart rate, blood pressure, and oxygen saturation were continuously monitored and I was present face-to-face 100% of this time.  Medications (Filter: Administrations occurring from 1006 to 1106 on 09/06/19) (important) Continuous medications are totaled by the amount administered until 09/06/19 1106.  fentaNYL  (SUBLIMAZE) injection (mcg) Total dose:  50 mcg Date/Time  Rate/Dose/Volume Action  09/06/19 1010  50 mcg Given    midazolam (VERSED) injection (mg) Total dose:  1 mg Date/Time  Rate/Dose/Volume Action  09/06/19 1010  1 mg Given    iohexol (OMNIPAQUE) 300 MG/ML solution (mL) Total volume:  80 mL Date/Time  Rate/Dose/Volume Action  09/06/19 1059  80 mL Given    Sedation Time  Sedation Time Physician-1: 38 minutes 4 seconds  Contrast  Medication Name Total Dose  iohexol (OMNIPAQUE) 300 MG/ML solution 80 mL    Radiation/Fluoro  Fluoro time: 4.5 (min) DAP: 17.5 (Gycm2) Cumulative Air Kerma: 217 (mGy)  Coronary Findings  Diagnostic Dominance: Right No diagnostic findings have been documented. Intervention  No interventions have been documented. Coronary Diagrams  Diagnostic Dominance: Right  Intervention  Implants   Vascular Products  Device Closure Mynxgrip 54f [phone] - Implanted Inventory item: DEVICE CLOSURE MYNXGRIP 86F Model/Cat number: [id number]  Manufacturer: [employer] Lot number: [id number]  Device identifier: [id number] Device identifier type: GS1  GUDID Information  Request status Successful    Brand name: [facility] Version/Model: OE4235  Company name: [employer] MRI safety info as of 09/06/19: MR Safe  Contains dry or latex rubber: No    GMDN P.T. name: Wound hydrogel dressing, non-antimicrobial    As of 09/06/2019  Status: Implanted      Syngo Images  Show images for CARDIAC CATHETERIZATION Images on Long Term Storage  Show images for [place], [place] to Procedure Log  Procedure Log    Hemo Data (last day) before discharge   AO Systolic Cath Pressure  AO Diastolic Cath Pressure  AO Mean Cath Pressure  LV Systolic Cath Pressure  LV End Diastolic  LV Systolic  LV End Diastolic  LV dP/dt  PA Systolic Cath Pressure  PA Diastolic Cath Pressure  PA Mean Cath Pressure  RA Wedge A Wave  RA Wedge V Wave  RV Systolic Cath  Pressure  RV Diastolic Cath Pressure  RV End Diastolic  RV Systolic  RV End Diastolic  RV dP/dt  PCW A Wave  PCW V Wave  PCW Mean  AO O2 Sat  PA O2 Sat  AO O2 Sat  Fick C.O.  Fick C.I.   --  --  --  --  --  216 mmHg  14 mmHg  1872 mmHg/sec  --  --  --  2 mmHg  2 mmHg  --  --  --  17 mmHg  3 mmHg  240 mmHg/sec  4 mmHg  2 mmHg  1 mmHg  --  --  --  5.34 L/min  2.85 L/min/m2   --  --  --  --  --  --  --  --  19 mmHg  1 mmHg  8 mmHg  --  --  --  --  --  --  --  --  --  --  --  --  --  --  --  --   --  --  --  --  --  --  --  --  --  --  --  --  --  17 mmHg  -1  mmHg  3 mmHg  --  --  --  --  --  --  --  --  --  --  --   --  --  --  --  --  --  --  --  --  --  --  --  --  --  --  --  --  --  --  --  --  --  94.8 %  --  SA  --  --   --  --  --  --  --  --  --  --  --  --  --  --  --  --  --  --  --  --  --  --  --  --  --  PA  --  --  --   141  61 mmHg  91 mmHg  --  --  --  --  --  --  --  --  --  --  --  --  --  --  --  --  --  --  --  --  --  --  --  --   --  --  --  223 mmHg  16 mmHg  --  --  --  --  --  --  --  --  --  --  --  --  --  --  --  --  --  --  --  --  --  --   --  --  --  227 mmHg  16 mmHg  --  --  --  --  --  --  --  --  --  --  --  --  --  --  --  --  --  --  --  --  --  --   --  --  --  209 mmHg  16 mmHg  --  --  --  --  --  --  --  --  --  --  --  --  --  --  --  --  --  --  --  --  --  --   --  --  --  219 mmHg  14 mmHg  --  --  --  --  --  --  --  --  --  --  --  --  --  --  --  --  --  --  --  --  --  --   170  72 mmHg  112 mmHg                                                     EKG: NSR w/out significant AV conduction delay (08/31/2019)     Cardiac TAVR CT  TECHNIQUE: The patient was scanned on a Siemens Force 248 slice scanner. A 120 kV retrospective scan was triggered in the descending thoracic aorta at 111 HU's. Gantry rotation speed was 270 msecs and collimation was .9 mm. No beta blockade or nitro were given. The 3D data set was reconstructed in 5% intervals of the R-R  cycle. Systolic and diastolic phases were analyzed on a dedicated work station using MPR, MIP and VRT modes. The patient received 80 cc of contrast.  FINDINGS: Aortic Valve: Tri leaflet with heavily calcified non coronary cusp and restricted motion  Aorta: Normal  aortic root with no aneurysm Normal arch vessels no Coarctation and moderate calcific atherosclerosis  Sinotubular Junction: 26 mm  Ascending Thoracic Aorta: 34 mm  Aortic Arch: 28 mm  Descending Thoracic Aorta: 26 mm  Sinus of Valsalva Measurements:  Non-coronary: 33.4 mm  Right - coronary: 30.6 mm  Left - coronary: 34.8 mm  Coronary Artery Height above Annulus:  Left Main: 13.9 mm above annulus  Right Coronary: 16.8 mm above annulus  Virtual Basal Annulus Measurements:  Maximum/Minimum Diameter: 28.8 mm x 23.8 mm  Perimeter: 83.2 mm cc  Area: 531 mm 2  Coronary Arteries: Sufficient height above annulus for deployment  Optimum Fluoroscopic Angle for Delivery: LAO 1 Caudal 3 degrees  IMPRESSION: 1. Tri leaflet AV with calcium score of 3591 and annular area of 531 which is upper range limit for 26 mm Sapien 3 valve  2.  Coronary arteries sufficient height above annulus for deployment  3.  Optimum angiographic angle for deployment LAO 1 Caudal 3 degrees  4.  Normal ascending aortic root 3.4 cm  Jenkins Rouge   Electronically Signed   By: Jenkins Rouge M.D.   On: 09/18/2019 12:26    CT ANGIOGRAPHY CHEST, ABDOMEN AND PELVIS  TECHNIQUE: Non-contrast CT of the chest was initially obtained.  Multidetector CT imaging through the chest, abdomen and pelvis was performed using the standard protocol during bolus administration of intravenous contrast. Multiplanar reconstructed images and MIPs were obtained and reviewed to evaluate the vascular anatomy.  CONTRAST:  130mL OMNIPAQUE IOHEXOL 350 MG/ML SOLN  COMPARISON:  CT of the chest, abdomen and pelvis  02/21/2019.  FINDINGS: CTA CHEST FINDINGS  Cardiovascular: Heart size is borderline enlarged with severe concentric left ventricular hypertrophy. There is no significant pericardial fluid, thickening or pericardial calcification. There is aortic atherosclerosis, as well as atherosclerosis of the great vessels of the mediastinum and the coronary arteries, including calcified atherosclerotic plaque in the left main, left anterior descending, left circumflex and right coronary arteries. Severe thickening calcification of the aortic valve.  Mediastinum/Lymph Nodes: No pathologically enlarged mediastinal or hilar lymph nodes. There are no aggressive appearing lytic or blastic lesions noted in the visualized portions of the skeleton. No axillary lymphadenopathy.  Lungs/Pleura: Status post right upper lobectomy with compensatory hyperexpansion of the right middle and lower lobes. Mild scattered scarring in the right lung, most evident basal segments of the right lower lobe. No acute consolidative airspace disease. No pleural effusions. A few scattered tiny pulmonary nodules are noted in the lungs bilaterally measuring 3 mm or less in size, similar to prior examinations. Diffuse bronchial wall thickening with mild centrilobular and paraseptal emphysema  Musculoskeletal/Soft Tissues: There are no aggressive appearing lytic or blastic lesions noted in the visualized portions of the skeleton.  CTA ABDOMEN AND PELVIS FINDINGS  Hepatobiliary: Well-defined 3.9 x 3.2 cm low-attenuation lesion in segment 7 of the liver, compatible with a simple cyst. No suspicious hepatic lesions. No intra or extrahepatic biliary ductal dilatation. Gallbladder is normal in appearance.  Pancreas: No pancreatic mass. No pancreatic ductal dilatation. No pancreatic or peripancreatic fluid collections or inflammatory changes.  Spleen: Unremarkable.  Adrenals/Urinary Tract: Low-attenuation lesions in  both kidneys compatible with simple cysts, largest of which is exophytic in the lower pole of the left kidney measuring 3.3 cm in diameter. Other subcentimeter low-attenuation lesions in both kidneys, too small to definitively characterize, but statistically likely to represent tiny cysts. Bilateral adrenal glands are normal in appearance. No hydroureteronephrosis. Urinary bladder is unremarkable in appearance.  Stomach/Bowel: Normal appearance of the stomach. No pathologic dilatation of small bowel or colon. Numerous colonic diverticulae are noted, particularly in the descending colon and sigmoid colon, without surrounding inflammatory changes to suggest an acute diverticulitis at this time. Normal appendix.  Vascular/Lymphatic: Aortic atherosclerosis, without evidence of aneurysm or dissection in the abdominal or pelvic vasculature. Vascular findings and measurements pertinent to potential TAVR procedure, as detailed below. No lymphadenopathy noted in the abdomen or pelvis.  Reproductive: Brachytherapy implants within the prostate gland. Seminal vesicles are unremarkable in appearance.  Other: No significant volume of ascites.  No pneumoperitoneum.  Musculoskeletal: There are no aggressive appearing lytic or blastic lesions noted in the visualized portions of the skeleton.  VASCULAR MEASUREMENTS PERTINENT TO TAVR:  AORTA:  Minimal Aortic Diameter-15 x 14 mm  Severity of Aortic Calcification-moderate  RIGHT PELVIS:  Right Common Iliac Artery -  Minimal Diameter-7.9 x 9.6 mm  Tortuosity-mild-to-moderate  Calcification-mild-to-moderate  Right External Iliac Artery -  Minimal Diameter-8.3 x 7.9 mm  Tortuosity-severe  Calcification-mild  Right Common Femoral Artery -  Minimal Diameter-9.2 x 8.8 mm  Tortuosity-mild  Calcification-mild  LEFT PELVIS:  Left Common Iliac Artery -  Minimal Diameter-9.9 x 8.5  mm  Tortuosity-mild  Calcification-mild  Left External Iliac Artery -  Minimal Diameter-8.1 x 6.8 mm  Tortuosity-moderate to severe  Calcification-mild  Left Common Femoral Artery -  Minimal Diameter-8.2 x 7.4 mm  Tortuosity-mild  Calcification-moderate  Review of the MIP images confirms the above findings.  IMPRESSION: 1. Vascular findings and measurements pertinent to potential TAVR procedure, as detailed above. 2. Severe thickening calcification of the aortic valve, compatible with the reported clinical history of severe aortic stenosis. 3. Aortic atherosclerosis, in addition to left main and 3 vessel coronary artery disease. 4. Tiny pulmonary nodules measuring 3 mm or less in size, stable compared to the prior study from February 2021, strongly favored to be benign. No follow-up needed if patient is low-risk (and has no known or suspected primary neoplasm). Non-contrast chest CT can be considered in 12 months if patient is high-risk. This recommendation follows the consensus statement: Guidelines for Management of Incidental Pulmonary Nodules Detected on CT Images: From the Fleischner Society 2017; Radiology 2017; 284:228-243. 5. Colonic diverticulosis without evidence of acute diverticulitis at this time. 6. Additional incidental findings, as above.   Electronically Signed   By: Vinnie Langton M.D.   On: 09/18/2019 10:53   Impression:  Patient has stage D1 severe symptomatic aortic stenosis.  He describes gradual progression of symptoms of exertional shortness of breath and fatigue consistent with chronic diastolic congestive heart failure, New York Heart Association functional class II.  I have personally reviewed the patient's recent transthoracic echocardiogram, diagnostic cardiac catheterization, CT angiograms, and EKG.  Echocardiogram reveals normal left ventricular systolic function with severe aortic stenosis.  There is severe  thickening, calcification, and restricted leaflet mobility involving all 3 leaflets of the patient's aortic valve.  Peak velocity across aortic valve measured between 4.1 and 4.3 m/s corresponding to mean transvalvular gradient estimated greater than 40 mmHg.  Left ventricular function appears normal with ejection fraction estimated 60 to 65%.  Diagnostic cardiac catheterization confirmed the presence of severe aortic stenosis and revealed mild nonobstructive coronary artery disease.  Right heart pressures were normal.  Cardiac-gated CTA of the heart reveals anatomical characteristics consistent with aortic stenosis suitable for treatment by transcatheter aortic valve replacement without any significant complicating features and CTA of the aorta and iliac vessels demonstrate what appears to be  adequate pelvic vascular access to facilitate a transfemoral approach.  EKG reveals sinus rhythm without significant AV conduction delay.  CT angiogram of the chest also revealed tiny pulmonary nodules measuring 3 mm or less which appear benign but probably require follow-up given the patient's history of lung cancer in the remote past.    Plan:  The patient and his wife were counseled at length regarding treatment alternatives for management of severe symptomatic aortic stenosis. Alternative approaches such as conventional aortic valve replacement, transcatheter aortic valve replacement, and continued medical therapy without intervention were compared and contrasted at length.  The risks associated with conventional surgical aortic valve replacement were discussed in detail, as were expectations for post-operative convalescence, alternative surgical approaches, prosthetic valve choices, and the presence or absence of other concomitant conditions which might require intervention.  Issues specific to transcatheter aortic valve replacement were discussed including questions about long term valve durability, the potential  for paravalvular leak, possible increased risk of need for permanent pacemaker placement, the suitability of the patient's surgical anatomy, and other potential technical complications related to the procedure itself.  Long-term prognosis without surgical intervention was discussed.  All treatment options were discussed in the context of the patient's own specific clinical presentation, past medical history, long-term prognosis and life expectancy.  All of their questions have been addressed.  The patient desires to proceed with transcatheter aortic valve replacement as soon as practical.  We tentatively plan for surgery on October 02, 2019.  The patient and his wife were also notified regarding the tiny lung nodule seen on CT imaging and the recommendation that he undergo follow-up CT scan in 6 to 12 months.  Following the decision to proceed with transcatheter aortic valve replacement, a discussion has been held regarding what types of management strategies would be attempted intraoperatively in the event of life-threatening complications, including whether or not the patient would be considered a candidate for the use of cardiopulmonary bypass and/or conversion to open sternotomy for attempted surgical intervention.  The patient specifically requests that should a potentially life-threatening complication develop we would attempt emergency median sternotomy and/or other aggressive surgical procedures.  The patient has been advised of a variety of complications that might develop including but not limited to risks of death, stroke, paravalvular leak, aortic dissection or other major vascular complications, aortic annulus rupture, device embolization, cardiac rupture or perforation, mitral regurgitation, acute myocardial infarction, arrhythmia, heart block or bradycardia requiring permanent pacemaker placement, congestive heart failure, respiratory failure, renal failure, pneumonia, infection, other late  complications related to structural valve deterioration or migration, or other complications that might ultimately cause a temporary or permanent loss of functional independence or other long term morbidity.  The patient provides full informed consent for the procedure as described and all questions were answered.    I spent in excess of 90 minutes during the conduct of this office consultation and >50% of this time involved direct face-to-face encounter with the patient for counseling and/or coordination of their care.     Valentina Gu. Roxy Manns, MD 09/26/2019 10:59 AM

## 2019-09-27 ENCOUNTER — Encounter: Payer: Medicare Other | Admitting: Thoracic Surgery (Cardiothoracic Vascular Surgery)

## 2019-09-27 NOTE — Progress Notes (Signed)
[facility] - [place], [place] - [address] [place] [address] Phone: [phone] Fax: [phone]      Your procedure is scheduled on September 21  Report to [facility] Main Entrance "A" at 0530 A.M., and check in at the Admitting office.  Call this number if you have problems the morning of surgery:  [phone]  Call [phone] if you have any questions prior to your surgery date Monday-Friday 8am-4pm    Remember:  Do not eat or drink after midnight the night before your surgery  There are No medications that you need to take the morning of surgery  As of today, STOP taking any Aspirin (unless otherwise instructed by your surgeon) Aleve, Naproxen, Ibuprofen, Motrin, Advil, Goody's, BC's, all herbal medications, fish oil, and all vitamins.                      Do not wear jewelry            Do not wear lotions, powders, colognes, or deodorant.            Men may shave face and neck.            Do not bring valuables to the hospital.            [facility] is not responsible for any belongings or valuables.  Do NOT Smoke (Tobacco/Vaping) or drink Alcohol 24 hours prior to your procedure If you use a CPAP at night, you may bring all equipment for your overnight stay.   Contacts, glasses, dentures or bridgework may not be worn into surgery.      For patients admitted to the hospital, discharge time will be determined by your treatment team.   Patients discharged the day of surgery will not be allowed to drive home, and someone needs to stay with them for 24 hours.    Special instructions:   [facility]- Preparing For Surgery  Before surgery, you can play an important role. Because skin is not sterile, your skin needs to be as free of germs as possible. You can reduce the number of germs on your skin by washing with CHG (chlorahexidine gluconate) Soap before surgery.  CHG is an antiseptic cleaner which kills germs and bonds with the skin  to continue killing germs even after washing.    Oral Hygiene is also important to reduce your risk of infection.  Remember - BRUSH YOUR TEETH THE MORNING OF SURGERY WITH YOUR REGULAR TOOTHPASTE  Please do not use if you have an allergy to CHG or antibacterial soaps. If your skin becomes reddened/irritated stop using the CHG.  Do not shave (including legs and underarms) for at least 48 hours prior to first CHG shower. It is OK to shave your face.  Please follow these instructions carefully.   1. Shower the NIGHT BEFORE SURGERY and the MORNING OF SURGERY with CHG Soap.   2. If you chose to wash your hair, wash your hair first as usual with your normal shampoo.  3. After you shampoo, rinse your hair and body thoroughly to remove the shampoo.  4. Use CHG as you would any other liquid soap. You can apply CHG directly to the skin and wash gently with a scrungie or a clean washcloth.   5. Apply the CHG Soap to your body ONLY FROM THE NECK DOWN.  Do not use on open wounds or open sores. Avoid contact with your eyes,  ears, mouth and genitals (private parts). Wash Face and genitals (private parts)  with your normal soap.   6. Wash thoroughly, paying special attention to the area where your surgery will be performed.  7. Thoroughly rinse your body with warm water from the neck down.  8. DO NOT shower/wash with your normal soap after using and rinsing off the CHG Soap.  9. Pat yourself dry with a CLEAN TOWEL.  10. Wear CLEAN PAJAMAS to bed the night before surgery  11. Place CLEAN SHEETS on your bed the night of your first shower and DO NOT SLEEP WITH PETS.   Day of Surgery: Wear Clean/Comfortable clothing the morning of surgery Do not apply any deodorants/lotions.   Remember to brush your teeth WITH YOUR REGULAR TOOTHPASTE.   Please read over the following fact sheets that you were given.

## 2019-09-28 ENCOUNTER — Other Ambulatory Visit: Payer: Self-pay

## 2019-09-28 ENCOUNTER — Encounter (HOSPITAL_COMMUNITY): Payer: Self-pay

## 2019-09-28 ENCOUNTER — Other Ambulatory Visit (HOSPITAL_COMMUNITY)
Admission: RE | Admit: 2019-09-28 | Discharge: 2019-09-28 | Disposition: A | Discharge status: Home / Self Care | Payer: Medicare Other | Source: Ambulatory Visit | Attending: Cardiovascular Disease | Admitting: Cardiovascular Disease

## 2019-09-28 ENCOUNTER — Encounter (HOSPITAL_COMMUNITY)
Admission: RE | Admit: 2019-09-28 | Discharge: 2019-09-28 | Disposition: A | Discharge status: Home / Self Care | Payer: Medicare Other | Source: Ambulatory Visit | Attending: Cardiovascular Disease | Admitting: Cardiovascular Disease

## 2019-09-28 DIAGNOSIS — I350 Nonrheumatic aortic (valve) stenosis: Secondary | ICD-10-CM | POA: Insufficient documentation

## 2019-09-28 DIAGNOSIS — Z01818 Encounter for other preprocedural examination: Secondary | ICD-10-CM | POA: Diagnosis not present

## 2019-09-28 DIAGNOSIS — C3490 Malignant neoplasm of unspecified part of unspecified bronchus or lung: Secondary | ICD-10-CM | POA: Diagnosis not present

## 2019-09-28 DIAGNOSIS — Z20822 Contact with and (suspected) exposure to covid-19: Secondary | ICD-10-CM | POA: Diagnosis not present

## 2019-09-28 HISTORY — DX: Benign prostatic hyperplasia without lower urinary tract symptoms: N40.0

## 2019-09-28 HISTORY — DX: Psoriasis vulgaris: L40.0

## 2019-09-28 LAB — SURGICAL PCR SCREEN
MRSA, PCR: NEGATIVE
Staphylococcus aureus: NEGATIVE

## 2019-09-28 LAB — HEMOGLOBIN A1C
Hgb A1c MFr Bld: 5.9 % — ABNORMAL HIGH (ref 4.8–5.6)
Mean Plasma Glucose: 122.63 mg/dL

## 2019-09-28 LAB — COMPREHENSIVE METABOLIC PANEL
ALT: 16 U/L (ref 0–44)
AST: 20 U/L (ref 15–41)
Albumin: 3.9 g/dL (ref 3.5–5.0)
Alkaline Phosphatase: 57 U/L (ref 38–126)
Anion gap: 9 (ref 5–15)
BUN: 16 mg/dL (ref 8–23)
CO2: 21 mmol/L — ABNORMAL LOW (ref 22–32)
Calcium: 9.1 mg/dL (ref 8.9–10.3)
Chloride: 101 mmol/L (ref 98–111)
Creatinine, Ser: 0.95 mg/dL (ref 0.61–1.24)
GFR calc Af Amer: >60 mL/min (ref >60)
GFR calc non Af Amer: >60 mL/min (ref >60)
Glucose, Bld: 104 mg/dL — ABNORMAL HIGH (ref 70–99)
Potassium: 4.1 mmol/L (ref 3.5–5.1)
Sodium: 131 mmol/L — ABNORMAL LOW (ref 135–145)
Total Bilirubin: 0.6 mg/dL (ref 0.3–1.2)
Total Protein: 6.7 g/dL (ref 6.5–8.1)

## 2019-09-28 LAB — SARS CORONAVIRUS 2 (TAT 6-24 HRS): SARS Coronavirus 2: NEGATIVE

## 2019-09-28 LAB — CBC
HCT: 36.7 % — ABNORMAL LOW (ref 39.0–52.0)
Hemoglobin: 12.5 g/dL — ABNORMAL LOW (ref 13.0–17.0)
MCH: 32.2 pg (ref 26.0–34.0)
MCHC: 34.1 g/dL (ref 30.0–36.0)
MCV: 94.6 fL (ref 80.0–100.0)
Platelets: 303 10*3/uL (ref 150–400)
RBC: 3.88 MIL/uL — ABNORMAL LOW (ref 4.22–5.81)
RDW: 14.1 % (ref 11.5–15.5)
WBC: 4.9 10*3/uL (ref 4.0–10.5)
nRBC: 0 % (ref 0.0–0.2)

## 2019-09-28 LAB — URINALYSIS, ROUTINE W REFLEX MICROSCOPIC
Bacteria, UA: NONE SEEN
Bilirubin Urine: NEGATIVE
Glucose, UA: NEGATIVE mg/dL
Ketones, ur: NEGATIVE mg/dL
Leukocytes,Ua: NEGATIVE
Nitrite: NEGATIVE
Protein, ur: NEGATIVE mg/dL
Specific Gravity, Urine: 1.019 (ref 1.005–1.030)
pH: 5 (ref 5.0–8.0)

## 2019-09-28 LAB — BLOOD GAS, ARTERIAL
Acid-base deficit: 0.1 mmol/L (ref 0.0–2.0)
Bicarbonate: 24 mmol/L (ref 20.0–28.0)
Drawn by: 58793
FIO2: 21
O2 Saturation: 97.5 %
Patient temperature: 37
pCO2 arterial: 38.7 mmHg (ref 32.0–48.0)
pH, Arterial: 7.409 (ref 7.350–7.450)
pO2, Arterial: 92.6 mmHg (ref 83.0–108.0)

## 2019-09-28 LAB — PROTIME-INR
INR: 1 (ref 0.8–1.2)
Prothrombin Time: 12.6 seconds (ref 11.4–15.2)

## 2019-09-28 LAB — TYPE AND SCREEN
ABO/RH(D): B POS
Antibody Screen: NEGATIVE

## 2019-09-28 LAB — BRAIN NATRIURETIC PEPTIDE: B Natriuretic Peptide: 64.8 pg/mL (ref 0.0–100.0)

## 2019-09-28 LAB — APTT: aPTT: 35 seconds (ref 24–36)

## 2019-09-28 NOTE — Progress Notes (Signed)
PCP - Dr. Damita Dunnings Cardiologist - Dr. [name]  PPM/ICD - n/a Device Orders -  Rep Notified -   Chest x-ray - 09/28/2019 EKG - 09/28/2019 Stress Test - patient states he has but cannot remember where or which doctor. ECHO - 08/22/2019 Cardiac Cath - 09/06/2019  Sleep Study - n/a CPAP -   Fasting Blood Sugar -  n/a Checks Blood Sugar _____ times a day  Blood Thinner Instructions: n/a Aspirin Instructions: n/a  ERAS Protcol - n/a PRE-SURGERY Ensure or G2-   COVID TEST- after PAT appointment 09/28/2019   Anesthesia review: recent cardiac testing  Patient denies shortness of breath, fever, cough and chest pain at PAT appointment   All instructions explained to the patient, with a verbal understanding of the material. Patient agrees to go over the instructions while at home for a better understanding. Patient also instructed to self quarantine after being tested for COVID-19. The opportunity to ask questions was provided.

## 2019-10-01 MED ORDER — SODIUM CHLORIDE 0.9 % IV SOLN
INTRAVENOUS | Status: DC
  Filled 2019-10-01: qty 30

## 2019-10-01 MED ORDER — DEXMEDETOMIDINE HCL IN NACL 400 MCG/100ML IV SOLN
0.1000 ug/kg/h | INTRAVENOUS | Status: AC
  Administered 2019-10-02: 74.32 ug via INTRAVENOUS
  Administered 2019-10-02: 1 ug/kg/h via INTRAVENOUS
  Filled 2019-10-01: qty 100

## 2019-10-01 MED ORDER — SODIUM CHLORIDE 0.9 % IV SOLN
1.5000 g | INTRAVENOUS | Status: AC
  Administered 2019-10-02: 1.5 g via INTRAVENOUS
  Filled 2019-10-01: qty 1.5

## 2019-10-01 MED ORDER — POTASSIUM CHLORIDE 2 MEQ/ML IV SOLN
80.0000 meq | INTRAVENOUS | Status: DC
  Filled 2019-10-01: qty 40

## 2019-10-01 MED ORDER — NOREPINEPHRINE 4 MG/250ML-% IV SOLN
0.0000 ug/min | INTRAVENOUS | Status: DC
  Filled 2019-10-01: qty 250

## 2019-10-01 MED ORDER — MAGNESIUM SULFATE 50 % IJ SOLN
40.0000 meq | INTRAMUSCULAR | Status: DC
  Filled 2019-10-01: qty 9.85

## 2019-10-01 MED ORDER — VANCOMYCIN HCL 1250 MG/250ML IV SOLN
1250.0000 mg | INTRAVENOUS | Status: AC
  Administered 2019-10-02: 1250 mg via INTRAVENOUS
  Filled 2019-10-01: qty 250

## 2019-10-01 NOTE — Anesthesia Preprocedure Evaluation (Addendum)
Anesthesia Evaluation  Patient identified by MRN, date of birth, ID band Patient awake    Reviewed: Allergy & Precautions, NPO status , Patient's Chart, lab work & pertinent test results  Airway Mallampati: II  TM Distance: >3 FB     Dental   Pulmonary former smoker,    breath sounds clear to auscultation       Cardiovascular hypertension, + angina  Rhythm:Regular Rate:Normal + Systolic murmurs History noted CG   Neuro/Psych    GI/Hepatic negative GI ROS,   Endo/Other  negative endocrine ROS  Renal/GU negative Renal ROS     Musculoskeletal   Abdominal   Peds  Hematology   Anesthesia Other Findings   Reproductive/Obstetrics                            Anesthesia Physical Anesthesia Plan  ASA: III  Anesthesia Plan: General   Post-op Pain Management:    Induction: Intravenous  PONV Risk Score and Plan: 2 and Ondansetron and Midazolam  Airway Management Planned: Oral ETT  Additional Equipment: Arterial line  Intra-op Plan:   Post-operative Plan: Possible Post-op intubation/ventilation  Informed Consent: I have reviewed the patients History and Physical, chart, labs and discussed the procedure including the risks, benefits and alternatives for the proposed anesthesia with the patient or authorized representative who has indicated his/her understanding and acceptance.     Dental advisory given  Plan Discussed with: CRNA and Anesthesiologist  Anesthesia Plan Comments:        Anesthesia Quick Evaluation

## 2019-10-02 ENCOUNTER — Encounter (HOSPITAL_COMMUNITY): Payer: Self-pay | Admitting: Cardiovascular Disease

## 2019-10-02 ENCOUNTER — Other Ambulatory Visit: Payer: Self-pay

## 2019-10-02 ENCOUNTER — Inpatient Hospital Stay (HOSPITAL_COMMUNITY)
Admission: RE | Admit: 2019-10-02 | Discharge: 2019-10-03 | DRG: 267 | Disposition: A | Discharge status: Home / Self Care | Payer: Medicare Other | Attending: Cardiovascular Disease | Admitting: Cardiovascular Disease

## 2019-10-02 ENCOUNTER — Other Ambulatory Visit: Payer: Self-pay | Admitting: Physician Assistant

## 2019-10-02 ENCOUNTER — Inpatient Hospital Stay (HOSPITAL_COMMUNITY): Payer: Medicare Other

## 2019-10-02 ENCOUNTER — Encounter (HOSPITAL_COMMUNITY)
Admission: RE | Disposition: A | Discharge status: Home / Self Care | Payer: Self-pay | Source: Home / Self Care | Attending: Cardiovascular Disease

## 2019-10-02 ENCOUNTER — Inpatient Hospital Stay (HOSPITAL_COMMUNITY): Payer: Medicare Other | Admitting: Certified Registered"

## 2019-10-02 ENCOUNTER — Inpatient Hospital Stay (HOSPITAL_COMMUNITY): Payer: Medicare Other | Admitting: Physician Assistant

## 2019-10-02 DIAGNOSIS — D649 Anemia, unspecified: Secondary | ICD-10-CM | POA: Diagnosis present

## 2019-10-02 DIAGNOSIS — E785 Hyperlipidemia, unspecified: Secondary | ICD-10-CM | POA: Diagnosis present

## 2019-10-02 DIAGNOSIS — I350 Nonrheumatic aortic (valve) stenosis: Secondary | ICD-10-CM

## 2019-10-02 DIAGNOSIS — I6523 Occlusion and stenosis of bilateral carotid arteries: Secondary | ICD-10-CM | POA: Diagnosis present

## 2019-10-02 DIAGNOSIS — I10 Essential (primary) hypertension: Secondary | ICD-10-CM | POA: Diagnosis present

## 2019-10-02 DIAGNOSIS — C3490 Malignant neoplasm of unspecified part of unspecified bronchus or lung: Secondary | ICD-10-CM | POA: Diagnosis present

## 2019-10-02 DIAGNOSIS — Z87891 Personal history of nicotine dependence: Secondary | ICD-10-CM | POA: Diagnosis not present

## 2019-10-02 DIAGNOSIS — Z952 Presence of prosthetic heart valve: Secondary | ICD-10-CM | POA: Diagnosis not present

## 2019-10-02 DIAGNOSIS — R42 Dizziness and giddiness: Secondary | ICD-10-CM

## 2019-10-02 DIAGNOSIS — Z902 Acquired absence of lung [part of]: Secondary | ICD-10-CM | POA: Diagnosis not present

## 2019-10-02 DIAGNOSIS — R339 Retention of urine, unspecified: Secondary | ICD-10-CM | POA: Diagnosis present

## 2019-10-02 DIAGNOSIS — Z85820 Personal history of malignant melanoma of skin: Secondary | ICD-10-CM

## 2019-10-02 DIAGNOSIS — Z79899 Other long term (current) drug therapy: Secondary | ICD-10-CM | POA: Diagnosis not present

## 2019-10-02 DIAGNOSIS — Z85118 Personal history of other malignant neoplasm of bronchus and lung: Secondary | ICD-10-CM

## 2019-10-02 DIAGNOSIS — R911 Solitary pulmonary nodule: Secondary | ICD-10-CM | POA: Diagnosis present

## 2019-10-02 DIAGNOSIS — L409 Psoriasis, unspecified: Secondary | ICD-10-CM | POA: Diagnosis present

## 2019-10-02 DIAGNOSIS — Z006 Encounter for examination for normal comparison and control in clinical research program: Secondary | ICD-10-CM | POA: Diagnosis not present

## 2019-10-02 DIAGNOSIS — C3491 Malignant neoplasm of unspecified part of right bronchus or lung: Secondary | ICD-10-CM | POA: Diagnosis not present

## 2019-10-02 HISTORY — PX: TRANSCATHETER AORTIC VALVE REPLACEMENT, TRANSFEMORAL: SHX6400

## 2019-10-02 HISTORY — PX: TEE WITHOUT CARDIOVERSION: SHX5443

## 2019-10-02 HISTORY — DX: Presence of prosthetic heart valve: Z95.2

## 2019-10-02 LAB — POCT I-STAT, CHEM 8
BUN: 15 mg/dL (ref 8–23)
BUN: 15 mg/dL (ref 8–23)
Calcium, Ion: 1.23 mmol/L (ref 1.15–1.40)
Calcium, Ion: 1.26 mmol/L (ref 1.15–1.40)
Chloride: 99 mmol/L (ref 98–111)
Chloride: 98 mmol/L (ref 98–111)
Creatinine, Ser: 0.6 mg/dL — ABNORMAL LOW (ref 0.61–1.24)
Creatinine, Ser: 0.7 mg/dL (ref 0.61–1.24)
Glucose, Bld: 120 mg/dL — ABNORMAL HIGH (ref 70–99)
Glucose, Bld: 123 mg/dL — ABNORMAL HIGH (ref 70–99)
HCT: 33 % — ABNORMAL LOW (ref 39.0–52.0)
HCT: 31 % — ABNORMAL LOW (ref 39.0–52.0)
Hemoglobin: 11.2 g/dL — ABNORMAL LOW (ref 13.0–17.0)
Hemoglobin: 10.5 g/dL — ABNORMAL LOW (ref 13.0–17.0)
Potassium: 3.9 mmol/L (ref 3.5–5.1)
Potassium: 4.2 mmol/L (ref 3.5–5.1)
Sodium: 135 mmol/L (ref 135–145)
Sodium: 135 mmol/L (ref 135–145)
TCO2: 24 mmol/L (ref 22–32)
TCO2: 24 mmol/L (ref 22–32)

## 2019-10-02 LAB — POCT I-STAT 7, (LYTES, BLD GAS, ICA,H+H)
Acid-Base Excess: 2 mmol/L (ref 0.0–2.0)
Bicarbonate: 28.2 mmol/L — ABNORMAL HIGH (ref 20.0–28.0)
Calcium, Ion: 1.24 mmol/L (ref 1.15–1.40)
HCT: 51 % (ref 39.0–52.0)
Hemoglobin: 17.3 g/dL — ABNORMAL HIGH (ref 13.0–17.0)
O2 Saturation: 100 %
Potassium: 3.8 mmol/L (ref 3.5–5.1)
Sodium: 136 mmol/L (ref 135–145)
TCO2: 30 mmol/L (ref 22–32)
pCO2 arterial: 46.2 mmHg (ref 32.0–48.0)
pH, Arterial: 7.393 (ref 7.350–7.450)
pO2, Arterial: 193 mmHg — ABNORMAL HIGH (ref 83.0–108.0)

## 2019-10-02 LAB — ECHOCARDIOGRAM LIMITED
AR max vel: 2.39 cm2
AV Area VTI: 3.06 cm2
AV Area mean vel: 2.35 cm2
AV Mean grad: 4 mmHg
AV Peak grad: 8.6 mmHg
Ao pk vel: 1.47 m/s

## 2019-10-02 LAB — ABO/RH: ABO/RH(D): B POS

## 2019-10-02 SURGERY — IMPLANTATION, AORTIC VALVE, TRANSCATHETER, FEMORAL APPROACH
Anesthesia: General | Site: Chest

## 2019-10-02 MED ORDER — NITROGLYCERIN IN D5W 200-5 MCG/ML-% IV SOLN: 0.0000 ug/min | INTRAVENOUS | Status: DC

## 2019-10-02 MED ORDER — TRAMADOL HCL 50 MG PO TABS: 50.0000 mg | ORAL_TABLET | ORAL | Status: DC | PRN

## 2019-10-02 MED ORDER — SODIUM CHLORIDE 0.9 % IV SOLN
1.5000 g | Freq: Two times a day (BID) | INTRAVENOUS | Status: DC
  Administered 2019-10-02 – 2019-10-03 (×2): 1.5 g via INTRAVENOUS
  Filled 2019-10-02 (×3): qty 1.5

## 2019-10-02 MED ORDER — SODIUM CHLORIDE 0.9 % IV SOLN: INTRAVENOUS | Status: DC

## 2019-10-02 MED ORDER — PHENYLEPHRINE HCL (PRESSORS) 10 MG/ML IV SOLN
INTRAVENOUS | Status: DC | PRN
  Administered 2019-10-02: 80 mg via INTRAVENOUS
  Administered 2019-10-02: 120 mg via INTRAVENOUS

## 2019-10-02 MED ORDER — LOSARTAN POTASSIUM 25 MG PO TABS
25.0000 mg | ORAL_TABLET | Freq: Every evening | ORAL | Status: DC
  Filled 2019-10-02: qty 1

## 2019-10-02 MED ORDER — HEPARIN SODIUM (PORCINE) 1000 UNIT/ML IJ SOLN
INTRAMUSCULAR | Status: DC | PRN
  Administered 2019-10-02: 12000 [IU] via INTRAVENOUS

## 2019-10-02 MED ORDER — MIDAZOLAM HCL 2 MG/2ML IJ SOLN
INTRAMUSCULAR | Status: AC
  Filled 2019-10-02: qty 2

## 2019-10-02 MED ORDER — LIDOCAINE HCL 1 % IJ SOLN
INTRAMUSCULAR | Status: AC
  Filled 2019-10-02: qty 20

## 2019-10-02 MED ORDER — VANCOMYCIN HCL IN DEXTROSE 1-5 GM/200ML-% IV SOLN
1000.0000 mg | Freq: Once | INTRAVENOUS | Status: AC
  Administered 2019-10-02: 1000 mg via INTRAVENOUS
  Filled 2019-10-02: qty 200

## 2019-10-02 MED ORDER — SODIUM CHLORIDE 0.9 % IV SOLN: 250.0000 mL | INTRAVENOUS | Status: DC | PRN

## 2019-10-02 MED ORDER — LACTATED RINGERS IV SOLN: INTRAVENOUS | Status: DC | PRN

## 2019-10-02 MED ORDER — SODIUM CHLORIDE 0.9% FLUSH
3.0000 mL | Freq: Two times a day (BID) | INTRAVENOUS | Status: DC
  Administered 2019-10-03: 3 mL via INTRAVENOUS

## 2019-10-02 MED ORDER — LACTATED RINGERS IV SOLN: INTRAVENOUS | Status: DC

## 2019-10-02 MED ORDER — FENTANYL CITRATE (PF) 250 MCG/5ML IJ SOLN
INTRAMUSCULAR | Status: AC
  Filled 2019-10-02: qty 5

## 2019-10-02 MED ORDER — CHLORHEXIDINE GLUCONATE 0.12 % MT SOLN
15.0000 mL | Freq: Once | OROMUCOSAL | Status: DC
  Filled 2019-10-02: qty 15

## 2019-10-02 MED ORDER — CHLORHEXIDINE GLUCONATE 4 % EX LIQD: 60.0000 mL | Freq: Once | CUTANEOUS | Status: DC

## 2019-10-02 MED ORDER — HYDRALAZINE HCL 50 MG PO TABS
50.0000 mg | ORAL_TABLET | Freq: Once | ORAL | Status: AC
  Administered 2019-10-02: 50 mg via ORAL
  Filled 2019-10-02: qty 1

## 2019-10-02 MED ORDER — ASPIRIN 81 MG PO CHEW
81.0000 mg | CHEWABLE_TABLET | Freq: Every day | ORAL | Status: DC
  Administered 2019-10-03: 81 mg via ORAL
  Filled 2019-10-02: qty 1

## 2019-10-02 MED ORDER — SODIUM CHLORIDE 0.9 % IV SOLN: INTRAVENOUS | Status: AC

## 2019-10-02 MED ORDER — OXYCODONE HCL 5 MG PO TABS: 5.0000 mg | ORAL_TABLET | ORAL | Status: DC | PRN

## 2019-10-02 MED ORDER — FOLIC ACID 1 MG PO TABS: 1.0000 mg | ORAL_TABLET | ORAL | Status: DC

## 2019-10-02 MED ORDER — CLOPIDOGREL BISULFATE 75 MG PO TABS
75.0000 mg | ORAL_TABLET | Freq: Every day | ORAL | Status: DC
  Administered 2019-10-03: 75 mg via ORAL
  Filled 2019-10-02: qty 1

## 2019-10-02 MED ORDER — SODIUM CHLORIDE 0.9% FLUSH: 3.0000 mL | INTRAVENOUS | Status: DC | PRN

## 2019-10-02 MED ORDER — ACETAMINOPHEN 650 MG RE SUPP: 650.0000 mg | Freq: Four times a day (QID) | RECTAL | Status: DC | PRN

## 2019-10-02 MED ORDER — ACETAMINOPHEN 325 MG PO TABS
650.0000 mg | ORAL_TABLET | Freq: Four times a day (QID) | ORAL | Status: DC | PRN
  Administered 2019-10-02: 650 mg via ORAL
  Filled 2019-10-02: qty 2

## 2019-10-02 MED ORDER — LIDOCAINE HCL 1 % IJ SOLN
INTRAMUSCULAR | Status: DC | PRN
  Administered 2019-10-02: 20 mL

## 2019-10-02 MED ORDER — ONDANSETRON HCL 4 MG/2ML IJ SOLN: 4.0000 mg | Freq: Four times a day (QID) | INTRAMUSCULAR | Status: DC | PRN

## 2019-10-02 MED ORDER — SODIUM CHLORIDE 0.9 % IV SOLN
INTRAVENOUS | Status: AC
  Filled 2019-10-02 (×3): qty 1.2

## 2019-10-02 MED ORDER — PROTAMINE SULFATE 10 MG/ML IV SOLN
INTRAVENOUS | Status: DC | PRN
  Administered 2019-10-02: 120 mg via INTRAVENOUS

## 2019-10-02 MED ORDER — IODIXANOL 320 MG/ML IV SOLN
INTRAVENOUS | Status: DC | PRN
  Administered 2019-10-02: 150 mL

## 2019-10-02 MED ORDER — PHENYLEPHRINE HCL-NACL 20-0.9 MG/250ML-% IV SOLN: 0.0000 ug/min | INTRAVENOUS | Status: DC

## 2019-10-02 MED ORDER — HYDRALAZINE HCL 50 MG PO TABS
50.0000 mg | ORAL_TABLET | ORAL | Status: DC | PRN
  Administered 2019-10-03: 50 mg via ORAL
  Filled 2019-10-02: qty 1

## 2019-10-02 MED ORDER — ORAL CARE MOUTH RINSE: 15.0000 mL | Freq: Once | OROMUCOSAL | Status: DC

## 2019-10-02 MED ORDER — PROPOFOL 500 MG/50ML IV EMUL
INTRAVENOUS | Status: DC | PRN
  Administered 2019-10-02: 50 ug/kg/min via INTRAVENOUS

## 2019-10-02 MED ORDER — MORPHINE SULFATE (PF) 4 MG/ML IV SOLN: 1.0000 mg | INTRAVENOUS | Status: DC | PRN

## 2019-10-02 MED ORDER — 0.9 % SODIUM CHLORIDE (POUR BTL) OPTIME
TOPICAL | Status: DC | PRN
  Administered 2019-10-02: 1000 mL

## 2019-10-02 MED ORDER — SODIUM CHLORIDE 0.9 % IV SOLN
INTRAVENOUS | Status: DC | PRN
  Administered 2019-10-02: 500 mL

## 2019-10-02 MED ORDER — CHLORHEXIDINE GLUCONATE 4 % EX LIQD: 30.0000 mL | CUTANEOUS | Status: DC

## 2019-10-02 MED ORDER — MIDAZOLAM HCL 2 MG/2ML IJ SOLN
INTRAMUSCULAR | Status: DC | PRN
  Administered 2019-10-02: 1 mg via INTRAVENOUS

## 2019-10-02 MED ORDER — PROPOFOL 10 MG/ML IV BOLUS
INTRAVENOUS | Status: DC | PRN
  Administered 2019-10-02: 20 mg via INTRAVENOUS

## 2019-10-02 MED ORDER — CHLORHEXIDINE GLUCONATE 0.12 % MT SOLN: 15.0000 mL | Freq: Once | OROMUCOSAL | Status: DC

## 2019-10-02 SURGICAL SUPPLY — 96 items
ADH SKN CLS APL DERMABOND .7 (GAUZE/BANDAGES/DRESSINGS) ×2
APL PRP STRL LF DISP 70% ISPRP (MISCELLANEOUS) ×2
BAG DECANTER FOR FLEXI CONT (MISCELLANEOUS) ×2 IMPLANT
BAG SNAP BAND KOVER 36X36 (MISCELLANEOUS) ×4 IMPLANT
BLADE CLIPPER SURG (BLADE) IMPLANT
BLADE STERNUM SYSTEM 6 (BLADE) IMPLANT
BLADE SURG 10 STRL SS (BLADE) IMPLANT
CABLE ADAPT CONN TEMP 6FT (ADAPTER) ×4 IMPLANT
CANISTER SUCT 3000ML PPV (MISCELLANEOUS) ×2 IMPLANT
CATH DIAG EXPO 6F AL1 (CATHETERS) IMPLANT
CATH DIAG EXPO 6F VENT PIG 145 (CATHETERS) ×8 IMPLANT
CATH EXTERNAL FEMALE PUREWICK (CATHETERS) IMPLANT
CATH INFINITI 6F AL2 (CATHETERS) ×2 IMPLANT
CATH S G BIP PACING (CATHETERS) ×4 IMPLANT
CHLORAPREP W/TINT 26 (MISCELLANEOUS) ×4 IMPLANT
CLIP VESOCCLUDE MED 24/CT (CLIP) IMPLANT
CLIP VESOCCLUDE SM WIDE 24/CT (CLIP) IMPLANT
CLOSURE MYNX CONTROL 6F/7F (Vascular Products) ×2 IMPLANT
CNTNR URN SCR LID CUP LEK RST (MISCELLANEOUS) ×4 IMPLANT
CONT SPEC 4OZ STRL OR WHT (MISCELLANEOUS) ×8
COVER BACK TABLE 80X110 HD (DRAPES) IMPLANT
COVER WAND RF STERILE (DRAPES) ×2 IMPLANT
DECANTER SPIKE VIAL GLASS SM (MISCELLANEOUS) ×4 IMPLANT
DERMABOND ADVANCED (GAUZE/BANDAGES/DRESSINGS) ×2
DERMABOND ADVANCED .7 DNX12 (GAUZE/BANDAGES/DRESSINGS) ×2 IMPLANT
DEVICE CLOSURE PERCLS PRGLD 6F (VASCULAR PRODUCTS) ×4 IMPLANT
DRAPE INCISE IOBAN 66X45 STRL (DRAPES) IMPLANT
DRSG TEGADERM 4X10 (GAUZE/BANDAGES/DRESSINGS) ×2 IMPLANT
DRSG TEGADERM 4X4.75 (GAUZE/BANDAGES/DRESSINGS) ×8 IMPLANT
ELECT CAUTERY BLADE 6.4 (BLADE) IMPLANT
ELECT REM PT RETURN 9FT ADLT (ELECTROSURGICAL) ×8
ELECTRODE REM PT RTRN 9FT ADLT (ELECTROSURGICAL) ×4 IMPLANT
FELT TEFLON 6X6 (MISCELLANEOUS) ×4 IMPLANT
GAUZE SPONGE 4X4 12PLY STRL (GAUZE/BANDAGES/DRESSINGS) ×4 IMPLANT
GAUZE SPONGE 4X4 12PLY STRL LF (GAUZE/BANDAGES/DRESSINGS) ×2 IMPLANT
GLOVE BIO SURGEON STRL SZ7.5 (GLOVE) IMPLANT
GLOVE BIO SURGEON STRL SZ8 (GLOVE) IMPLANT
GLOVE BIOGEL PI IND STRL 6.5 (GLOVE) IMPLANT
GLOVE BIOGEL PI IND STRL 7.5 (GLOVE) IMPLANT
GLOVE BIOGEL PI INDICATOR 6.5 (GLOVE) ×4
GLOVE BIOGEL PI INDICATOR 7.5 (GLOVE) ×2
GLOVE EUDERMIC 7 POWDERFREE (GLOVE) IMPLANT
GLOVE ORTHO TXT STRL SZ7.5 (GLOVE) IMPLANT
GOWN STRL REUS W/ TWL LRG LVL3 (GOWN DISPOSABLE) IMPLANT
GOWN STRL REUS W/ TWL XL LVL3 (GOWN DISPOSABLE) ×2 IMPLANT
GOWN STRL REUS W/TWL LRG LVL3 (GOWN DISPOSABLE) ×4
GOWN STRL REUS W/TWL XL LVL3 (GOWN DISPOSABLE) ×12
GUIDEWIRE SAF TJ AMPL .035X180 (WIRE) ×4 IMPLANT
GUIDEWIRE SAFE TJ AMPLATZ EXST (WIRE) ×4 IMPLANT
INSERT FOGARTY SM (MISCELLANEOUS) IMPLANT
KIT BASIN OR (CUSTOM PROCEDURE TRAY) ×4 IMPLANT
KIT HEART LEFT (KITS) ×4 IMPLANT
KIT SUCTION CATH 14FR (SUCTIONS) IMPLANT
KIT TURNOVER KIT B (KITS) ×4 IMPLANT
LOOP VESSEL MAXI BLUE (MISCELLANEOUS) IMPLANT
LOOP VESSEL MINI RED (MISCELLANEOUS) IMPLANT
NS IRRIG 1000ML POUR BTL (IV SOLUTION) ×4 IMPLANT
PACK ENDO MINOR (CUSTOM PROCEDURE TRAY) ×4 IMPLANT
PAD ARMBOARD 7.5X6 YLW CONV (MISCELLANEOUS) ×8 IMPLANT
PAD ELECT DEFIB RADIOL ZOLL (MISCELLANEOUS) ×4 IMPLANT
PENCIL BUTTON HOLSTER BLD 10FT (ELECTRODE) IMPLANT
PERCLOSE PROGLIDE 6F (VASCULAR PRODUCTS) ×8
POSITIONER HEAD DONUT 9IN (MISCELLANEOUS) ×4 IMPLANT
SET MICROPUNCTURE 5F STIFF (MISCELLANEOUS) ×4 IMPLANT
SHEATH BRITE TIP 7FR 35CM (SHEATH) ×4 IMPLANT
SHEATH PINNACLE 6F 10CM (SHEATH) ×4 IMPLANT
SHEATH PINNACLE 8F 10CM (SHEATH) ×4 IMPLANT
SLEEVE REPOSITIONING LENGTH 30 (MISCELLANEOUS) ×4 IMPLANT
SPONGE LAP 18X18 RF (DISPOSABLE) ×2 IMPLANT
STOPCOCK MORSE 400PSI 3WAY (MISCELLANEOUS) ×8 IMPLANT
SUT ETHIBOND X763 2 0 SH 1 (SUTURE) IMPLANT
SUT GORETEX CV 4 TH 22 36 (SUTURE) IMPLANT
SUT GORETEX CV4 TH-18 (SUTURE) IMPLANT
SUT MNCRL AB 3-0 PS2 18 (SUTURE) IMPLANT
SUT PROLENE 5 0 C 1 36 (SUTURE) IMPLANT
SUT PROLENE 6 0 C 1 30 (SUTURE) IMPLANT
SUT SILK  1 MH (SUTURE) ×4
SUT SILK 1 MH (SUTURE) ×2 IMPLANT
SUT VIC AB 2-0 CT1 27 (SUTURE)
SUT VIC AB 2-0 CT1 TAPERPNT 27 (SUTURE) IMPLANT
SUT VIC AB 2-0 CTX 36 (SUTURE) IMPLANT
SUT VIC AB 3-0 SH 8-18 (SUTURE) IMPLANT
SYR 50ML LL SCALE MARK (SYRINGE) ×4 IMPLANT
SYR BULB IRRIG 60ML STRL (SYRINGE) IMPLANT
SYR MEDRAD MARK V 150ML (SYRINGE) ×4 IMPLANT
TOWEL GREEN STERILE (TOWEL DISPOSABLE) ×8 IMPLANT
TRANSDUCER W/STOPCOCK (MISCELLANEOUS) ×8 IMPLANT
TRAY FOLEY SLVR 14FR TEMP STAT (SET/KITS/TRAYS/PACK) IMPLANT
TUBE SUCT INTRACARD DLP 20F (MISCELLANEOUS) IMPLANT
TUBING ART PRESS 72  MALE/FEM (TUBING) ×4
TUBING ART PRESS 72 MALE/FEM (TUBING) IMPLANT
VALVE 26 ULTRA SAPIEN KIT (Valve) ×2 IMPLANT
WIRE AMPLATZ SSTIFF .035X260CM (WIRE) ×2 IMPLANT
WIRE EMERALD 3MM-J .035X150CM (WIRE) ×4 IMPLANT
WIRE EMERALD 3MM-J .035X260CM (WIRE) ×4 IMPLANT
WIRE EMERALD ST .035X260CM (WIRE) ×2 IMPLANT

## 2019-10-02 NOTE — Op Note (Signed)
HEART AND VASCULAR CENTER   MULTIDISCIPLINARY HEART VALVE TEAM   TAVR OPERATIVE NOTE   Date of Procedure:  10/02/2019  Preoperative Diagnosis: Severe Aortic Stenosis   Postoperative Diagnosis: Same   Procedure:    Transcatheter Aortic Valve Replacement - Percutaneous Left Transfemoral Approach  Edwards Sapien 3 Ultra THV (size 26 mm, model # 9750TFX, serial # [id number])   Co-Surgeons:  Valentina Gu. 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  Patient states that he was first discovered to have a heart murmur on physical exam approximately 10 years ago.  He was initially followed by his primary care physician and later referred to Dr. [name] who has been following him regularly ever since.  Over the last several years the patient has experienced a gradual decline in his exercise tolerance with progressive exertional shortness of breath and increased fatigue.  Recent follow-up transthoracic echocardiogram performed August 22, 2019 revealed significant progression of disease and severe aortic stenosis.  Peak velocity across the aortic valve measured as high as 4.3 m/s corresponding to mean transvalvular gradient estimated 43 mmHg.  Left ventricular systolic function appeared normal with ejection  fraction estimated 60 to 65%.  Diagnostic cardiac catheterization was performed by Dr. [name] September 06, 2019 and revealed normal coronary artery anatomy with no significant coronary artery disease.  Peak to peak and mean transvalvular gradients across the aortic valve were reported 66 and 41 mmHg respectively.  Pulmonary artery pressures were normal.  The patient was referred to the multidisciplinary heart valve clinic and has been evaluated previously by Dr. Burt Knack.  CT angiography was performed and the patient was referred for surgical consultation.  During the course of the patient's preoperative work up they have been evaluated comprehensively by a multidisciplinary team of specialists coordinated through the [facility] in the [facility].  They have been demonstrated to suffer from symptomatic severe aortic stenosis as noted above. The patient has been counseled extensively as to the relative risks and benefits of all options for the treatment of severe aortic stenosis including long term medical therapy, conventional surgery for aortic valve replacement, and transcatheter aortic valve replacement.  All questions have been answered, and the patient provides full informed consent for the operation as described.   DETAILS OF THE OPERATIVE PROCEDURE  PREPARATION:    The patient is brought to the operating room on the above mentioned date and appropriate monitoring was established by the anesthesia team. The patient is placed in the supine position on the operating table.  Intravenous antibiotics are administered. The patient is monitored closely throughout the procedure under conscious sedation.  Baseline transthoracic echocardiogram was performed. The patient's chest, abdomen, both groins, and both lower extremities are prepared and draped in a sterile manner. A time out procedure is performed.   PERIPHERAL ACCESS:    Using the modified  Seldinger technique, femoral arterial and venous  access was obtained with placement of 6 Fr sheaths on the right side.  A pigtail diagnostic catheter was passed through the right arterial sheath under fluoroscopic guidance into the aortic root.  A temporary transvenous pacemaker catheter was passed through the right femoral venous sheath under fluoroscopic guidance into the right ventricle.  The pacemaker was tested to ensure stable lead placement and pacemaker capture. Aortic root angiography was performed in order to determine the optimal angiographic angle for valve deployment.   TRANSFEMORAL ACCESS:   Percutaneous transfemoral access and sheath placement was performed using ultrasound guidance.  The left common femoral artery was cannulated using a micropuncture needle and appropriate location was verified using hand injection angiogram.  A pair of Abbott Perclose percutaneous closure devices were placed and a 6 French sheath replaced into the femoral artery.  The patient was heparinized systemically and ACT verified > 250 seconds.    A 14 Fr transfemoral E-sheath was introduced into the left common femoral artery after progressively dilating over an Amplatz superstiff wire. An AL-2 catheter was used to direct a straight-tip exchange length wire across the native aortic valve into the left ventricle. This was exchanged out for a pigtail catheter and position was confirmed in the LV apex. Simultaneous LV and Ao pressures were recorded.  The pigtail catheter was exchanged for an Amplatz Extra-stiff wire in the LV apex.  Echocardiography was utilized to confirm appropriate wire position and no sign of entanglement in the mitral subvalvular apparatus.   TRANSCATHETER HEART VALVE DEPLOYMENT:   An Edwards Sapien 3 Ultra transcatheter heart valve (size 26 mm, model #9750TFX, serial #[id number]) was prepared and crimped per manufacturer's guidelines, and the proper orientation of the valve is confirmed on the  [employer] delivery system. The valve was advanced through the introducer sheath using normal technique until in an appropriate position in the abdominal aorta beyond the sheath tip. The balloon was then retracted and using the fine-tuning wheel was centered on the valve. The valve was then advanced across the aortic arch using appropriate flexion of the catheter. The valve was carefully positioned across the aortic valve annulus. The Commander catheter was retracted using normal technique. Once final position of the valve has been confirmed by angiographic assessment, the valve is deployed while temporarily holding ventilation and during rapid ventricular pacing to maintain systolic blood pressure < 50 mmHg and pulse pressure < 10 mmHg. The balloon inflation is held for >3 seconds after reaching full deployment volume. Once the balloon has fully deflated the balloon is retracted into the ascending aorta and valve function is assessed using echocardiography. There is felt to be trivial paravalvular leak and no central aortic insufficiency.  The patient's hemodynamic recovery following valve deployment is good.  The deployment balloon and guidewire are both removed.    PROCEDURE COMPLETION:   The sheath was removed and femoral artery closure performed.  Protamine was administered once femoral arterial repair was complete. The temporary pacemaker, pigtail catheters and femoral sheaths were removed with manual pressure used for hemostasis.  A Mynx femoral closure device was utilized following removal of the diagnostic sheath in the right femoral artery.  The patient tolerated the procedure well and is transported to the surgical intensive care in stable condition. There were no immediate intraoperative complications. All sponge instrument and needle counts are verified correct at completion of the operation.   No blood products were administered during the operation.  The patient received a total of  60 mL of intravenous contrast  during the procedure.   Rexene Alberts, MD 10/02/2019 9:48 AM

## 2019-10-02 NOTE — Progress Notes (Signed)
@  2009 Dr. Kalman Shan, on-call for Attending Service, paged regarding pt's elevated SBPs (170s-180s). Page promptly returned and order received for a single repeat dose of PO hydralazine. Medication administered @2048 .  @2224  Dr. Kalman Shan paged regarding pt's SBPs remaining elevated (170s) despite administration of PO hydralazine. Page promptly returned and PRN hydralazine order placed for sustained SBP>160. Will continue to monitor.

## 2019-10-02 NOTE — Discharge Instructions (Signed)
ACTIVITY AND EXERCISE °• Daily activity and exercise are an important part of your recovery. People recover at different rates depending on their general health and type of valve procedure. °• Most people recovering from TAVR feel better relatively quickly  °• No lifting, pushing, pulling more than 10 pounds (examples to avoid: groceries, vacuuming, gardening, golfing): °            - For one week with a procedure through the groin. °            - For six weeks for procedures through the chest wall or neck. °NOTE: You will typically see one of our providers 7-14 days after your procedure to discuss WHEN TO RESUME the above activities.  °  °  °DRIVING °• Do not drive until you are seen for follow up and cleared by a provider. Generally, we ask patient to not drive for 1 week after their procedure. °• If you have been told by your doctor in the past that you may not drive, you must talk with him/her before you begin driving again. °  °DRESSING °• Groin site: you may leave the clear dressing over the site for up to one week or until it falls off. °  °HYGIENE °• If you had a femoral (leg) procedure, you may take a shower when you return home. After the shower, pat the site dry. Do NOT use powder, oils or lotions in your groin area until the site has completely healed. °• If you had a chest procedure, you may shower when you return home unless specifically instructed not to by your discharging practitioner. °            - DO NOT scrub incision; pat dry with a towel. °            - DO NOT apply any lotions, oils, powders to the incision. °            - No tub baths / swimming for at least 2 weeks. °• If you notice any fevers, chills, increased pain, swelling, bleeding or pus, please contact your doctor. °  °ADDITIONAL INFORMATION °• If you are going to have an upcoming dental procedure, please contact our office as you will require antibiotics ahead of time to prevent infection on your heart valve.  ° ° °If you have any  questions or concerns you can call the structural heart phone during normal business hours 8am-4pm. If you have an urgent need after hours or weekends please call [phone] to talk to the on call provider for general cardiology. If you have an emergency that requires immediate attention, please call 911.  ° ° °After TAVR Checklist ° °Check  Test Description  ° Follow up appointment in 1-2 weeks  You will see our structural heart physician assistant, Katie Cera Rorke. Your incision sites will be checked and you will be cleared to drive and resume all normal activities if you are doing well.    ° 1 month echo and follow up  You will have an echo to check on your new heart valve and be seen back in the office by Katie Myleka Moncure. Many times the echo is not read by your appointment time, but Katie will call you later that day or the following day to report your results.  ° Follow up with your primary cardiologist You will need to be seen by your primary cardiologist in the following 3-6 months after your 1 month appointment in the valve   clinic. Often times your Plavix or Aspirin will be discontinued during this time, but this is decided on a case by case basis.   ° 1 year echo and follow up You will have another echo to check on your heart valve after 1 year and be seen back in the office by Katie Lizbett Garciagarcia. This your last structural heart visit.  ° Bacterial endocarditis prophylaxis  You will have to take antibiotics for the rest of your life before all dental procedures (even teeth cleanings) to protect your heart valve. Antibiotics are also required before some surgeries. Please check with your cardiologist before scheduling any surgeries. Also, please make sure to tell us if you have a penicillin allergy as you will require an alternative antibiotic.   ° ° °

## 2019-10-02 NOTE — Transfer of Care (Signed)
Immediate Anesthesia Transfer of Care Note  Patient: Brett Mcguire  Procedure(s) Performed: TRANSCATHETER AORTIC VALVE REPLACEMENT, TRANSFEMORAL USING EDWARDS SAPIEN 3 ULTRA 26 MM THV. (N/A Chest) TRANSESOPHAGEAL ECHOCARDIOGRAM (TEE) (N/A )  Patient Location: Cath Lab  Anesthesia Type:MAC  Level of Consciousness: awake, alert  and oriented  Airway & Oxygen Therapy: Patient Spontanous Breathing and Patient connected to nasal cannula oxygen  Post-op Assessment: Report given to RN, Post -op Vital signs reviewed and stable and Patient moving all extremities  Post vital signs: Reviewed and stable  Last Vitals:  Vitals Value Taken Time  BP    Temp    Pulse    Resp    SpO2      Last Pain:  Vitals:   10/02/19 0616  TempSrc:   PainSc: 0-No pain         Complications: No complications documented.

## 2019-10-02 NOTE — Anesthesia Procedure Notes (Signed)
Arterial Line Insertion Start/End9/21/2021 6:50 AM, 10/02/2019 6:55 AM Performed by: Amadeo Garnet, CRNA, CRNA  Patient location: Pre-op. Preanesthetic checklist: patient identified, IV checked, site marked, risks and benefits discussed, surgical consent, monitors and equipment checked, pre-op evaluation, timeout performed and anesthesia consent Lidocaine 1% used for infiltration Right, radial was placed Catheter size: 20 G Hand hygiene performed  and maximum sterile barriers used   Attempts: 1 Procedure performed without using ultrasound guided technique. Following insertion, Biopatch and dressing applied. Post procedure assessment: normal  Patient tolerated the procedure well with no immediate complications.

## 2019-10-02 NOTE — Progress Notes (Signed)
Patient arrived from cath lab.  Alert & oriented x 3 (unsure of what procedure he had today).  Reoriented w/o difficulty.   CCMD notified.  Vital signs taken & monitoring Q 1 hour.  Oriented to room.  Wife at bedside.

## 2019-10-02 NOTE — Op Note (Signed)
HEART AND VASCULAR CENTER   MULTIDISCIPLINARY HEART VALVE TEAM   TAVR OPERATIVE NOTE   Date of Procedure:  10/02/2019  Preoperative Diagnosis: Severe Aortic Stenosis   Postoperative Diagnosis: Same   Procedure:    Transcatheter Aortic Valve Replacement - Percutaneous Transfemoral Approach  Edwards Sapien 3 UltraTHV (size 26 mm, model # 9750TFX, serial # [id number])   Co-Surgeons:  Valentina Gu. Roxy Manns, MD and Sherren Mocha, MD  Anesthesiologist:  Belinda Block, MD  Echocardiographer:  Sanda Klein, MD  Pre-operative Echo Findings:  Severe aortic stenosis  Normal left ventricular systolic function  Post-operative Echo Findings:  Trivial paravalvular leak  Normal left ventricular systolic function  BRIEF CLINICAL NOTE AND INDICATIONS FOR SURGERY  74 year old male with remote history of tobacco abuse and lung cancer who underwent right upper lobectomy in 1989.  He has developed symptoms of severe aortic stenosis and has undergone multidisciplinary heart team evaluation.  He is now referred for TAVR.  During the course of the patient's preoperative work up they have been evaluated comprehensively by a multidisciplinary team of specialists coordinated through the [facility] in the [facility].  They have been demonstrated to suffer from symptomatic severe aortic stenosis as noted above. The patient has been counseled extensively as to the relative risks and benefits of all options for the treatment of severe aortic stenosis including long term medical therapy, conventional surgery for aortic valve replacement, and transcatheter aortic valve replacement.  The patient has been independently evaluated in formal cardiac surgical consultation by Dr Roxy Manns, who deemed the patient appropriate for TAVR. Based upon review of all of the patient's preoperative diagnostic tests they are felt to be candidate for transcatheter aortic valve  replacement using the transfemoral approach as an alternative to conventional surgery.    Following the decision to proceed with transcatheter aortic valve replacement, a discussion has been held regarding what types of management strategies would be attempted intraoperatively in the event of life-threatening complications, including whether or not the patient would be considered a candidate for the use of cardiopulmonary bypass and/or conversion to open sternotomy for attempted surgical intervention.  The patient has been advised of a variety of complications that might develop peculiar to this approach including but not limited to risks of death, stroke, paravalvular leak, aortic dissection or other major vascular complications, aortic annulus rupture, device embolization, cardiac rupture or perforation, acute myocardial infarction, arrhythmia, heart block or bradycardia requiring permanent pacemaker placement, congestive heart failure, respiratory failure, renal failure, pneumonia, infection, other late complications related to structural valve deterioration or migration, or other complications that might ultimately cause a temporary or permanent loss of functional independence or other long term morbidity.  The patient provides full informed consent for the procedure as described and all questions were answered preoperatively.  DETAILS OF THE OPERATIVE PROCEDURE  PREPARATION:   The patient is brought to the operating room on the above mentioned date and central monitoring was established by the anesthesia team including placement of a central venous catheter and radial arterial line. The patient is placed in the supine position on the operating table.  Intravenous antibiotics are administered. The patient is monitored closely throughout the procedure under conscious sedation.  Baseline transthoracic echocardiogram is performed. The patient's chest, abdomen, both groins, and both lower extremities are  prepared and draped in a sterile manner. A time out procedure is performed.   PERIPHERAL ACCESS:   Using ultrasound guidance, femoral arterial and venous access is obtained  with placement of 6 Fr sheaths on the right side.  A pigtail diagnostic catheter was passed through the femoral arterial sheath under fluoroscopic guidance into the aortic root.  A temporary transvenous pacemaker catheter was passed through the femoral venous sheath under fluoroscopic guidance into the right ventricle.  The pacemaker was tested to ensure stable lead placement and pacemaker capture. Aortic root angiography was performed in order to determine the optimal angiographic angle for valve deployment.  TRANSFEMORAL ACCESS:  A micropuncture technique is used to access the left femoral artery under fluoroscopic and ultrasound guidance.  2 Perclose devices are deployed at 10' and 2' positions to 'PreClose' the femoral artery. An 8 French sheath is placed and then an Amplatz Superstiff wire is advanced through the sheath. This is changed out for a 14 French transfemoral E-Sheath after progressively dilating over the Superstiff wire.  An AL-2 catheter was used to direct a straight-tip exchange length wire across the native aortic valve into the left ventricle. This was exchanged out for a pigtail catheter and position was confirmed in the LV apex. Simultaneous LV and Ao pressures were recorded.  The pigtail catheter was exchanged for an Amplatz Extra-stiff wire in the LV apex.    BALLOON AORTIC VALVULOPLASTY:  Not performed  TRANSCATHETER HEART VALVE DEPLOYMENT:  An Edwards Sapien 3 transcatheter heart valve (size 26 mm) was prepared and crimped per manufacturer's guidelines, and the proper orientation of the valve is confirmed on the [employer] delivery system. The valve was advanced through the introducer sheath using normal technique until in an appropriate position in the abdominal aorta beyond the sheath tip. The  balloon was then retracted and using the fine-tuning wheel was centered on the valve. The valve was then advanced across the aortic arch using appropriate flexion of the catheter. The valve was carefully positioned across the aortic valve annulus. The Commander catheter was retracted using normal technique. Once final position of the valve has been confirmed by angiographic assessment, the valve is deployed while temporarily holding ventilation and during rapid ventricular pacing to maintain systolic blood pressure < 50 mmHg and pulse pressure < 10 mmHg. The balloon inflation is held for >3 seconds after reaching full deployment volume. Once the balloon has fully deflated the balloon is retracted into the ascending aorta and valve function is assessed using echocardiography. The patient's hemodynamic recovery following valve deployment is good.  The deployment balloon and guidewire are both removed. Echo demostrated acceptable post-procedural gradients, stable mitral valve function, and trace aortic insufficiency.    PROCEDURE COMPLETION:  The sheath was removed and femoral artery closure is performed using the 2 previously deployed Perclose devices.  Protamine is administered once femoral arterial repair was complete. The site is clear with no evidence of bleeding or hematoma after the sutures are tightened. The temporary pacemaker and pigtail catheters are removed. Mynx closure is used for contralateral femoral arterial hemostasis for the 6 Fr sheath.  The patient tolerated the procedure well and is transported to the recovery area in stable condition. There were no immediate intraoperative complications. All sponge instrument and needle counts are verified correct at completion of the operation.   The patient received a total of 60 mL of intravenous contrast during the procedure.   Sherren Mocha, MD 10/02/2019 11:32 AM

## 2019-10-02 NOTE — Progress Notes (Signed)
Pt c/o shortness of breath partially upright in bed and just finished dinner.  Patient assisted to a sitting position on the side of the bed w/o difficulty.   Patient now reports no SOB.  Patient then assisted back into bed per request and resting comfortably.

## 2019-10-02 NOTE — Progress Notes (Signed)
Pt reports multiple symptoms of headache which got better, pressure below diaphragm then states "oh its just gas", groin pain then reports no pain if he is laying down, and shoulder pain which resolved.   Noted anxious with staff & elevated BP while staff in room.  Patient reassured his procedure went well, that his EKG and vital signs were stable.   Ambulated with RN at 625 pm x 100.  Tolerated well.

## 2019-10-02 NOTE — Progress Notes (Signed)
  Echocardiogram 2D Echocardiogram has been performed.  Brett Mcguire 10/02/2019, 9:32 AM

## 2019-10-02 NOTE — Progress Notes (Signed)
  [facility] VALVE TEAM  Patient doing well s/p TAVR. He is hemodynamically stable. Groin sites stable. ECG with sinus andd no high grade block. Arterial line discontinued and transferred  to 4E. Plan for early ambulation after bedrest completed and hopeful discharge over the next 24-48 hours. He has had some urinary retention and will plan to do a bladder scan +/- in and out cath if necessary.  Angelena Form PA-C  [facility]  Pager [phone]

## 2019-10-02 NOTE — Anesthesia Procedure Notes (Signed)
Procedure Name: MAC Date/Time: 10/02/2019 7:34 AM Performed by: Amadeo Garnet, CRNA Pre-anesthesia Checklist: Patient identified, Emergency Drugs available, Suction available and Patient being monitored Patient Re-evaluated:Patient Re-evaluated prior to induction Oxygen Delivery Method: Simple face mask Preoxygenation: Pre-oxygenation with 100% oxygen Induction Type: IV induction Placement Confirmation: positive ETCO2 Dental Injury: Teeth and Oropharynx as per pre-operative assessment

## 2019-10-02 NOTE — Progress Notes (Addendum)
Mobility Specialist: Progress Note   10/02/19 1723  Mobility  Activity Contraindicated/medical hold (Cancel)   Instructed by RN to not work with pt today due to high BP.   [facility] Jessicah Croll Mobility Specialist

## 2019-10-02 NOTE — Progress Notes (Signed)
Elevated BP's, Dr. Nyoka Cowden notified, no orders received. Will continue to monitor. Patient has no c/o dizziness, headache, or blurry vision.

## 2019-10-02 NOTE — Anesthesia Postprocedure Evaluation (Signed)
Anesthesia Post Note  Patient: Brett Mcguire  Procedure(s) Performed: TRANSCATHETER AORTIC VALVE REPLACEMENT, TRANSFEMORAL USING EDWARDS SAPIEN 3 ULTRA 26 MM THV. (N/A Chest) TRANSESOPHAGEAL ECHOCARDIOGRAM (TEE) (N/A )     Patient location during evaluation: PACU Anesthesia Type: General Level of consciousness: awake Pain management: pain level controlled Vital Signs Assessment: post-procedure vital signs reviewed and stable Respiratory status: spontaneous breathing Cardiovascular status: stable Postop Assessment: no apparent nausea or vomiting Anesthetic complications: no   No complications documented.  Last Vitals:  Vitals:   10/02/19 1330 10/02/19 1400  BP: (!) 152/75 (!) 163/78  Pulse: 64 66  Resp: 16 16  Temp:    SpO2: 97% 100%    Last Pain:  Vitals:   10/02/19 1300  TempSrc:   PainSc: 0-No pain                 Shayli Altemose

## 2019-10-02 NOTE — Progress Notes (Signed)
Pt c/o slight pressure in chest.  Denies pain and reports solely discomfort.  BP elevated.  EKG performed.   PA notified. And new orders for hydralazine received.   BP med and tylenol given.

## 2019-10-02 NOTE — Interval H&P Note (Signed)
History and Physical Interval Note:  10/02/2019 6:28 AM  Brett Mcguire  has presented today for surgery, with the diagnosis of Severe Aortic Stenosis.  The various methods of treatment have been discussed with the patient and family. After consideration of risks, benefits and other options for treatment, the patient has consented to  Procedure(s): TRANSCATHETER AORTIC VALVE REPLACEMENT, TRANSFEMORAL (N/A) TRANSESOPHAGEAL ECHOCARDIOGRAM (TEE) (N/A) as a surgical intervention.  The patient's history has been reviewed, patient examined, no change in status, stable for surgery.  I have reviewed the patient's chart and labs.  Questions were answered to the patient's satisfaction.     Rexene Alberts

## 2019-10-03 ENCOUNTER — Inpatient Hospital Stay (HOSPITAL_COMMUNITY): Payer: Medicare Other

## 2019-10-03 ENCOUNTER — Encounter (HOSPITAL_COMMUNITY): Payer: Self-pay | Admitting: Cardiovascular Disease

## 2019-10-03 DIAGNOSIS — Z952 Presence of prosthetic heart valve: Secondary | ICD-10-CM

## 2019-10-03 DIAGNOSIS — I350 Nonrheumatic aortic (valve) stenosis: Principal | ICD-10-CM

## 2019-10-03 LAB — BASIC METABOLIC PANEL
Anion gap: 9 (ref 5–15)
BUN: 13 mg/dL (ref 8–23)
CO2: 23 mmol/L (ref 22–32)
Calcium: 8.9 mg/dL (ref 8.9–10.3)
Chloride: 98 mmol/L (ref 98–111)
Creatinine, Ser: 0.82 mg/dL (ref 0.61–1.24)
GFR calc Af Amer: >60 mL/min (ref >60)
GFR calc non Af Amer: >60 mL/min (ref >60)
Glucose, Bld: 138 mg/dL — ABNORMAL HIGH (ref 70–99)
Potassium: 3.6 mmol/L (ref 3.5–5.1)
Sodium: 130 mmol/L — ABNORMAL LOW (ref 135–145)

## 2019-10-03 LAB — CBC
HCT: 34.5 % — ABNORMAL LOW (ref 39.0–52.0)
Hemoglobin: 11.7 g/dL — ABNORMAL LOW (ref 13.0–17.0)
MCH: 30.9 pg (ref 26.0–34.0)
MCHC: 33.9 g/dL (ref 30.0–36.0)
MCV: 91 fL (ref 80.0–100.0)
Platelets: 205 10*3/uL (ref 150–400)
RBC: 3.79 MIL/uL — ABNORMAL LOW (ref 4.22–5.81)
RDW: 13.9 % (ref 11.5–15.5)
WBC: 9.1 10*3/uL (ref 4.0–10.5)
nRBC: 0 % (ref 0.0–0.2)

## 2019-10-03 LAB — POCT I-STAT, CHEM 8
BUN: 15 mg/dL (ref 8–23)
Calcium, Ion: 1.3 mmol/L (ref 1.15–1.40)
Chloride: 98 mmol/L (ref 98–111)
Creatinine, Ser: 0.7 mg/dL (ref 0.61–1.24)
Glucose, Bld: 123 mg/dL — ABNORMAL HIGH (ref 70–99)
HCT: 33 % — ABNORMAL LOW (ref 39.0–52.0)
Hemoglobin: 11.2 g/dL — ABNORMAL LOW (ref 13.0–17.0)
Potassium: 4.3 mmol/L (ref 3.5–5.1)
Sodium: 134 mmol/L — ABNORMAL LOW (ref 135–145)
TCO2: 27 mmol/L (ref 22–32)

## 2019-10-03 LAB — ECHOCARDIOGRAM COMPLETE
AR max vel: 1.79 cm2
AV Area VTI: 2.24 cm2
AV Area mean vel: 1.91 cm2
AV Mean grad: 16.5 mmHg
AV Peak grad: 31.7 mmHg
Ao pk vel: 2.82 m/s
Area-P 1/2: 2.37 cm2
Height: 68.5 in
S' Lateral: 2.6 cm
Weight: 2543.23 oz

## 2019-10-03 LAB — MAGNESIUM: Magnesium: 1.7 mg/dL (ref 1.7–2.4)

## 2019-10-03 MED ORDER — LOSARTAN POTASSIUM 25 MG PO TABS
25.0000 mg | ORAL_TABLET | Freq: Every day | ORAL | Status: DC
  Administered 2019-10-03: 25 mg via ORAL
  Filled 2019-10-03: qty 1

## 2019-10-03 MED ORDER — LOSARTAN POTASSIUM 25 MG PO TABS: 25.0000 mg | ORAL_TABLET | Freq: Every evening | ORAL | Status: DC

## 2019-10-03 MED ORDER — CLOPIDOGREL BISULFATE 75 MG PO TABS: 75.0000 mg | ORAL_TABLET | Freq: Every day | ORAL | 1 refills | Status: DC

## 2019-10-03 MED ORDER — ASPIRIN 81 MG PO CHEW: 81.0000 mg | CHEWABLE_TABLET | Freq: Every day | ORAL | Status: AC

## 2019-10-03 MED ORDER — LOSARTAN POTASSIUM 25 MG PO TABS: 25.0000 mg | ORAL_TABLET | Freq: Once | ORAL | Status: AC

## 2019-10-03 NOTE — Progress Notes (Signed)
CARDIAC REHAB PHASE I   Offered to walk with pt. Pt states he ambulated this am with some SOB. Currently getting IV's d/c'd. Reviewed restrictions, site care, and  Exercise guidelines. Pt declines CRP II at this time.   [id number] Rufina Falco, RN BSN 10/03/2019 11:37 AM

## 2019-10-03 NOTE — Progress Notes (Signed)
Echocardiogram 2D Echocardiogram has been performed.  Oneal Deputy Chany Woolworth 10/03/2019, 10:58 AM

## 2019-10-03 NOTE — Progress Notes (Signed)
Discharge instructions given to patient. IV removed, clean and intact. Medications and wound care reviewed. All questions answered. Pt escorted home with wife.  Arletta Bale, RN

## 2019-10-03 NOTE — Discharge Summary (Signed)
Brett Mcguire  Discharge Summary    Patient ID: Brett Mcguire MRN: [id number]; DOB: [birth year]  Admit date: 10/02/2019 Discharge date: 10/03/2019  Primary Care Provider: Tonia Ghent, MD  Primary Cardiologist: Ida Rogue, MD / Dr. Burt Knack & Dr. 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 Alfuzosin     Skin rash, burning sensation of skin  . Cialis [Tadalafil] Other (See Comments)    heartburn  . Doxazosin     Skin rash, burning sensation of skin  . Ephedrine Other (See Comments)    BP spikes   . Finasteride     diarrhea  . Flomax [Tamsulosin Hcl]     Rash  . Rapaflo [Silodosin]     rash  . Sulfonamide Derivatives     REACTION: as child    Diagnostic Studies/Procedures    TAVR OPERATIVE NOTE   Date of Procedure:                10/02/2019  Preoperative Diagnosis:      Severe Aortic Stenosis   Postoperative Diagnosis:    Same   Procedure:        Transcatheter Aortic Valve Replacement - Percutaneous Left Transfemoral Approach             Edwards Sapien 3 Ultra THV (size 26 mm, model # 9750TFX, serial # [id number])              Co-Surgeons:                        Valentina Gu. Roxy Manns, MD and Sherren Mocha, MD  Anesthesiologist:                  Belinda Block, MD  Echocardiographer:              Sanda Klein, MD  Pre-operative Echo Findings: ? Severe aortic stenosis ? Normal left ventricular systolic function  Post-operative Echo Findings: ? Trivial paravalvular leak ? Normal left ventricular systolic function  _____________   Echo  10/03/19: complete but pending formal read at the time of discharge    History of Present Illness     Brett Mcguire is a 74 y.o. male with a history of HTN, HLD, moderate carotid disease (40-59% bilaterally), psoriasis on MTX and lung cancer status post right upper lobectomy (1989) and severe AS who presented to [facility] on 10/02/19 for planned TAVR.  Patient states that he was first discovered to have a heart murmur on physical exam approximately 10 years ago.  He was initially followed by his primary care physician and later referred to Dr. [name] who has been following him regularly ever since.  Over the last several years the patient has experienced a gradual decline in his exercise tolerance with progressive exertional shortness of breath and increased fatigue.  Recent follow-up transthoracic echocardiogram performed August 22, 2019 revealed significant progression of disease and severe aortic stenosis.  Peak velocity across the aortic valve measured as high as 4.3 m/s corresponding to mean transvalvular gradient estimated 43 mmHg.  Left  ventricular systolic function appeared normal with ejection fraction estimated 60 to 65%. Diagnostic cardiac catheterization was performed by Dr. [name] September 06, 2019 and revealed normal coronary artery anatomy with no significant coronary artery disease.  Peak to peak and mean transvalvular gradients across the aortic valve were reported 66 and 41 mmHg respectively.  Pulmonary artery pressures were normal.    The patient has been evaluated by the multidisciplinary valve Mcguire and felt to have severe, symptomatic aortic stenosis and to be a suitable candidate for TAVR, which was set up for 10/02/19.    Hospital Course     Consultants: none  Severe AS: s/p successful TAVR with a 26 mm Edwards Sapien 3 THV via the TF approach on 10/01/20. Post operative echo completed but pending formal read. Groin sites are stable. ECG with sinus and no high grade heart block. Continue  Asprin and Plavix 75mg  daily.   HTN: BP has been elevated. Resumed on home Losartan. Will follow in the outpatient setting.   Carotid disease: pre TAVR dopplers showed 40-59% stenosis bilaterally. Consider adding statin therapy.   Pulmonary nodule: pre TAVR CT showed a tiny pulmonary nodules measuring 3 mm or less in size, stable compared to the prior study from February 2021, strongly favored to be benign. No follow-up needed if patient is low-risk (and has no known or suspected primary neoplasm). Non-contrast chest CT can be considered in 12 months if patient is high-risk. Pt is high-risk with Hx of lung CA and this will be arranged for 12 months.    _____________  Discharge Vitals Blood pressure (!) 163/78, pulse 93, temperature 99.1 F (37.3 C), temperature source Oral, resp. rate 17, height 5' 8.5" (1.74 m), weight 72.1 kg, SpO2 95 %.  Filed Weights   10/02/19 0610 10/03/19 0500  Weight: 72.6 kg 72.1 kg    GEN: Well nourished, well developed, in no acute distress HEENT: normal Neck: no JVD or masses Cardiac: RRR; no murmurs, rubs, or gallops,no edema  Respiratory:  clear to auscultation bilaterally, normal work of breathing GI: soft, nontender, nondistended, + BS MS: no deformity or atrophy Skin: warm and dry, no rash.  Groin sites clear without hematoma or ecchymosis  Neuro:  Alert and Oriented x 3, Strength and sensation are intact Psych: euthymic mood, full affect  Labs & Radiologic Studies    CBC Recent Labs    10/02/19 0958 10/03/19 0346  WBC  --  9.1  HGB 10.5* 11.7*  HCT 31.0* 34.5*  MCV  --  91.0  PLT  --  124   Basic Metabolic Panel Recent Labs    10/02/19 0958 10/03/19 0346  NA 135 130*  K 4.2 3.6  CL 98 98  CO2  --  23  GLUCOSE 123* 138*  BUN 15 13  CREATININE 0.70 0.82  CALCIUM  --  8.9  MG  --  1.7   Liver Function Tests No results for input(s): AST, ALT, ALKPHOS, BILITOT, PROT, ALBUMIN in the last 72 hours. No results for input(s):  LIPASE, AMYLASE in the last 72 hours. Cardiac Enzymes No results for input(s): CKTOTAL, CKMB, CKMBINDEX, TROPONINI in the last 72 hours. BNP Invalid input(s): POCBNP D-Dimer No results for input(s): DDIMER in the last 72 hours. Hemoglobin A1C No results for input(s): HGBA1C in the last 72 hours. Fasting Lipid Panel No results for input(s): CHOL, HDL, LDLCALC, TRIG, CHOLHDL, LDLDIRECT in the last 72 hours. Thyroid Function Tests No results for input(s): TSH, T4TOTAL, T3FREE, THYROIDAB in the last  72 hours.  Invalid input(s): FREET3 _____________  DG Chest 2 View  Result Date: 09/28/2019 CLINICAL DATA:  Preoperative evaluation for aortic valve replacement, history of right lung cancer status post partial right pneumonectomy EXAM: CHEST - 2 VIEW COMPARISON:  11/16/2026 FINDINGS: Frontal and lateral views of the chest demonstrates stable postsurgical changes in the right hemithorax from partial right pneumonectomy. No airspace disease, effusion, or pneumothorax. The cardiac silhouette is stable. No acute bony abnormalities. IMPRESSION: 1. Stable exam, no acute process. Electronically Signed   By: Randa Ngo M.D.   On: 09/28/2019 23:23   CT CORONARY MORPH W/CTA COR W/SCORE W/CA W/CM &/OR WO/CM  Addendum Date: 09/18/2019   ADDENDUM REPORT: 09/18/2019 12:26 CLINICAL DATA:  Aortic stenosis EXAM: Cardiac TAVR CT TECHNIQUE: The patient was scanned on a Siemens Force 938 slice scanner. A 120 kV retrospective scan was triggered in the descending thoracic aorta at 111 HU's. Gantry rotation speed was 270 msecs and collimation was .9 mm. No beta blockade or nitro were given. The 3D data set was reconstructed in 5% intervals of the R-R cycle. Systolic and diastolic phases were analyzed on a dedicated work station using MPR, MIP and VRT modes. The patient received 80 cc of contrast. FINDINGS: Aortic Valve: Tri leaflet with heavily calcified non coronary cusp and restricted motion Aorta: Normal aortic root  with no aneurysm Normal arch vessels no Coarctation and moderate calcific atherosclerosis Sinotubular Junction: 26 mm Ascending Thoracic Aorta: 34 mm Aortic Arch: 28 mm Descending Thoracic Aorta: 26 mm Sinus of Valsalva Measurements: Non-coronary: 33.4 mm Right - coronary: 30.6 mm Left - coronary: 34.8 mm Coronary Artery Height above Annulus: Left Main: 13.9 mm above annulus Right Coronary: 16.8 mm above annulus Virtual Basal Annulus Measurements: Maximum/Minimum Diameter: 28.8 mm x 23.8 mm Perimeter: 83.2 mm cc Area: 531 mm 2 Coronary Arteries: Sufficient height above annulus for deployment Optimum Fluoroscopic Angle for Delivery: LAO 1 Caudal 3 degrees IMPRESSION: 1. Tri leaflet AV with calcium score of 3591 and annular area of 531 which is upper range limit for 26 mm Sapien 3 valve 2.  Coronary arteries sufficient height above annulus for deployment 3.  Optimum angiographic angle for deployment LAO 1 Caudal 3 degrees 4.  Normal ascending aortic root 3.4 cm Jenkins Rouge Electronically Signed   By: Jenkins Rouge M.D.   On: 09/18/2019 12:26   Result Date: 09/18/2019 EXAM: OVER-READ INTERPRETATION  CT CHEST The following report is an over-read performed by radiologist Dr. Vinnie Langton of [facility], [place] on 09/18/2019. This over-read does not include interpretation of cardiac or coronary anatomy or pathology. The coronary calcium score/coronary CTA interpretation by the cardiologist is attached. COMPARISON:  None. FINDINGS: Extracardiac findings will be described separately under dictation for contemporaneously obtained CTA chest, abdomen and pelvis. IMPRESSION: Please see separate dictation for contemporaneously obtained CTA chest, abdomen and pelvis dated 09/18/2019 for full description of relevant extracardiac findings. Electronically Signed: By: Vinnie Langton M.D. On: 09/18/2019 09:32   CT ANGIO CHEST AORTA W/CM & OR WO/CM  Result Date: 09/18/2019 CLINICAL DATA:  74 year old male with history of  severe aortic stenosis. Preprocedural study prior to potential transcatheter aortic valve replacement (TAVR) procedure. EXAM: CT ANGIOGRAPHY CHEST, ABDOMEN AND PELVIS TECHNIQUE: Non-contrast CT of the chest was initially obtained. Multidetector CT imaging through the chest, abdomen and pelvis was performed using the standard protocol during bolus administration of intravenous contrast. Multiplanar reconstructed images and MIPs were obtained and reviewed to evaluate the vascular anatomy. CONTRAST:  176mL  OMNIPAQUE IOHEXOL 350 MG/ML SOLN COMPARISON:  CT of the chest, abdomen and pelvis 02/21/2019. FINDINGS: CTA CHEST FINDINGS Cardiovascular: Heart size is borderline enlarged with severe concentric left ventricular hypertrophy. There is no significant pericardial fluid, thickening or pericardial calcification. There is aortic atherosclerosis, as well as atherosclerosis of the great vessels of the mediastinum and the coronary arteries, including calcified atherosclerotic plaque in the left main, left anterior descending, left circumflex and right coronary arteries. Severe thickening calcification of the aortic valve. Mediastinum/Lymph Nodes: No pathologically enlarged mediastinal or hilar lymph nodes. There are no aggressive appearing lytic or blastic lesions noted in the visualized portions of the skeleton. No axillary lymphadenopathy. Lungs/Pleura: Status post right upper lobectomy with compensatory hyperexpansion of the right middle and lower lobes. Mild scattered scarring in the right lung, most evident basal segments of the right lower lobe. No acute consolidative airspace disease. No pleural effusions. A few scattered tiny pulmonary nodules are noted in the lungs bilaterally measuring 3 mm or less in size, similar to prior examinations. Diffuse bronchial wall thickening with mild centrilobular and paraseptal emphysema Musculoskeletal/Soft Tissues: There are no aggressive appearing lytic or blastic lesions noted in  the visualized portions of the skeleton. CTA ABDOMEN AND PELVIS FINDINGS Hepatobiliary: Well-defined 3.9 x 3.2 cm low-attenuation lesion in segment 7 of the liver, compatible with a simple cyst. No suspicious hepatic lesions. No intra or extrahepatic biliary ductal dilatation. Gallbladder is normal in appearance. Pancreas: No pancreatic mass. No pancreatic ductal dilatation. No pancreatic or peripancreatic fluid collections or inflammatory changes. Spleen: Unremarkable. Adrenals/Urinary Tract: Low-attenuation lesions in both kidneys compatible with simple cysts, largest of which is exophytic in the lower pole of the left kidney measuring 3.3 cm in diameter. Other subcentimeter low-attenuation lesions in both kidneys, too small to definitively characterize, but statistically likely to represent tiny cysts. Bilateral adrenal glands are normal in appearance. No hydroureteronephrosis. Urinary bladder is unremarkable in appearance. Stomach/Bowel: Normal appearance of the stomach. No pathologic dilatation of small bowel or colon. Numerous colonic diverticulae are noted, particularly in the descending colon and sigmoid colon, without surrounding inflammatory changes to suggest an acute diverticulitis at this time. Normal appendix. Vascular/Lymphatic: Aortic atherosclerosis, without evidence of aneurysm or dissection in the abdominal or pelvic vasculature. Vascular findings and measurements pertinent to potential TAVR procedure, as detailed below. No lymphadenopathy noted in the abdomen or pelvis. Reproductive: Brachytherapy implants within the prostate gland. Seminal vesicles are unremarkable in appearance. Other: No significant volume of ascites.  No pneumoperitoneum. Musculoskeletal: There are no aggressive appearing lytic or blastic lesions noted in the visualized portions of the skeleton. VASCULAR MEASUREMENTS PERTINENT TO TAVR: AORTA: Minimal Aortic Diameter-15 x 14 mm Severity of Aortic Calcification-moderate RIGHT  PELVIS: Right Common Iliac Artery - Minimal Diameter-7.9 x 9.6 mm Tortuosity-mild-to-moderate Calcification-mild-to-moderate Right External Iliac Artery - Minimal Diameter-8.3 x 7.9 mm Tortuosity-severe Calcification-mild Right Common Femoral Artery - Minimal Diameter-9.2 x 8.8 mm Tortuosity-mild Calcification-mild LEFT PELVIS: Left Common Iliac Artery - Minimal Diameter-9.9 x 8.5 mm Tortuosity-mild Calcification-mild Left External Iliac Artery - Minimal Diameter-8.1 x 6.8 mm Tortuosity-moderate to severe Calcification-mild Left Common Femoral Artery - Minimal Diameter-8.2 x 7.4 mm Tortuosity-mild Calcification-moderate Review of the MIP images confirms the above findings. IMPRESSION: 1. Vascular findings and measurements pertinent to potential TAVR procedure, as detailed above. 2. Severe thickening calcification of the aortic valve, compatible with the reported clinical history of severe aortic stenosis. 3. Aortic atherosclerosis, in addition to left main and 3 vessel coronary artery disease. 4. Tiny pulmonary nodules measuring  3 mm or less in size, stable compared to the prior study from February 2021, strongly favored to be benign. No follow-up needed if patient is low-risk (and has no known or suspected primary neoplasm). Non-contrast chest CT can be considered in 12 months if patient is high-risk. This recommendation follows the consensus statement: Guidelines for Management of Incidental Pulmonary Nodules Detected on CT Images: From the Fleischner Society 2017; Radiology 2017; 284:228-243. 5. Colonic diverticulosis without evidence of acute diverticulitis at this time. 6. Additional incidental findings, as above. Electronically Signed   By: Vinnie Langton M.D.   On: 09/18/2019 10:53   VAS US CAROTID  Result Date: 09/18/2019 Carotid Arterial Duplex Study Indications:       Severe aortic stenosis - preop for MVR. Risk Factors:      Hypertension, hyperlipidemia, past history of smoking,                     coronary artery disease. Comparison Study:  Carotid 02/26/2015 - Bilateral 1-39% Performing Technologist: Velva Harman Sturdivant RDMS, RVT  Examination Guidelines: A complete evaluation includes B-mode imaging, spectral Doppler, color Doppler, and power Doppler as needed of all accessible portions of each vessel. Bilateral testing is considered an integral part of a complete examination. Limited examinations for reoccurring indications may be performed as noted.  Right Carotid Findings: +----------+--------+--------+--------+------------------+------------------+           PSV cm/sEDV cm/sStenosisPlaque DescriptionComments           +----------+--------+--------+--------+------------------+------------------+ CCA Prox  59      19                                                   +----------+--------+--------+--------+------------------+------------------+ CCA Distal56      15                                intimal thickening +----------+--------+--------+--------+------------------+------------------+ ICA Prox  50      22                                                   +----------+--------+--------+--------+------------------+------------------+ ICA Mid   130     48      40-59%  hypoechoic                           +----------+--------+--------+--------+------------------+------------------+ ICA Distal136     46                                                   +----------+--------+--------+--------+------------------+------------------+ ECA       86      17                                                   +----------+--------+--------+--------+------------------+------------------+ +----------+--------+-------+----------------+-------------------+  PSV cm/sEDV cmsDescribe        Arm Pressure (mmHG) +----------+--------+-------+----------------+-------------------+ Subclavian114            Multiphasic, WNL                     +----------+--------+-------+----------------+-------------------+ +---------+--------+---+--------+--+---------+ VertebralPSV cm/s109EDV cm/s30Antegrade +---------+--------+---+--------+--+---------+ Elevated velocities in bilateral mid to distal ICAs suggestive of 40-59% stenosis without significant plaque visualized. Left Carotid Findings: +----------+--------+--------+--------+------------------+------------------+           PSV cm/sEDV cm/sStenosisPlaque DescriptionComments           +----------+--------+--------+--------+------------------+------------------+ CCA Prox  72      24                                                   +----------+--------+--------+--------+------------------+------------------+ CCA Distal66      22                                intimal thickening +----------+--------+--------+--------+------------------+------------------+ ICA Prox  64      27      1-39%   calcific                             +----------+--------+--------+--------+------------------+------------------+ ICA Mid   155     56      40-59%  homogeneous                          +----------+--------+--------+--------+------------------+------------------+ ICA Distal120     45                                                   +----------+--------+--------+--------+------------------+------------------+ ECA       57      10                                                   +----------+--------+--------+--------+------------------+------------------+ +----------+--------+--------+--------+-------------------+           PSV cm/sEDV cm/sDescribeArm Pressure (mmHG) +----------+--------+--------+--------+-------------------+ Subclavian105                                         +----------+--------+--------+--------+-------------------+ +---------+--------+--+--------+--+ VertebralPSV cm/s70EDV cm/s22 +---------+--------+--+--------+--+   Summary: Right  Carotid: Velocities in the right ICA are consistent with a 40-59%                stenosis. Left Carotid: Velocities in the left ICA are consistent with a 40-59% stenosis. Vertebrals:  Bilateral vertebral arteries demonstrate antegrade flow. Subclavians: Normal flow hemodynamics were seen in bilateral subclavian              arteries. *See table(s) above for measurements and observations.  Electronically signed by Deitra Mayo MD on 09/18/2019 at 12:56:55 PM.    Final    ECHOCARDIOGRAM LIMITED  Result Date: 10/02/2019    ECHOCARDIOGRAM LIMITED REPORT   Patient Name:  Brett Mcguire Date of Exam: 10/02/2019 Medical Rec #:  [id number]       Height:       68.5 in Accession #:    [id number]      Weight:       160.0 lb Date of Birth:  October 20, 1945      BSA:          1.869 m Patient Age:    74 years        BP:           170/72 mmHg Patient Gender: M               HR:           64 bpm. Exam Location:  Inpatient Procedure: Limited Echo, Echo Assisted Procedure, Color Doppler and Cardiac            Doppler Indications:     TAVR Procedure; Aortic Stenosis 135.0  History:         Patient has prior history of Echocardiogram examinations, most                  recent 08/22/2019. CAD; Risk Factors:Hypertension and                  Dyslipidemia.                  Aortic Valve: 26 mm Sapien prosthetic, stented (TAVR) valve is                  present in the aortic position. Procedure Date: 10/02/2019.  Sonographer:     Mikki Santee RDCS (AE) Referring Phys:  [id number] Anniece Bleiler Diagnosing Phys: Sanda Klein MD                   PRE-PROCEDURE FINDINGS                   Normal left ventricular systolic function, estimated EF 65%.                  Moderate to severe calcific aortic stenosis. No aortic                  insufficiency.                  Peak aortic gradient 61 mm Hg, mean gradient 35 mm Hg,                  dimensionless index 0.19, calculated valve area 0.66 cm2.                  Mild mitral insufficiency.                   No pericardial effusion.                   POST-PROCEDURE FINDINGS                   Hyperdynamic left ventricular systolic function, estimated EF                  70-75%.                  Well seated Sapien3 26 mm stent-valve (TAVR)                  There is trivial medial perivalvular leak.  Peak aortic gradient 9 mm Hg, mean gradient 4 mm Hg,                  dimensionless index 0.8, calculated valve area 3.0 cm2.                  Mild mitral insufficiency.                  No pericardial effusion. IMPRESSIONS  1. Left ventricular ejection fraction, by estimation, is 60 to 65%. The left ventricle has normal function. The left ventricle has no regional wall motion abnormalities. There is mild concentric left ventricular hypertrophy.  2. Right ventricular systolic function is normal. The right ventricular size is normal.  3. The mitral valve is normal in structure. Mild mitral valve regurgitation.  4. There is a 26 mm Sapien prosthetic (TAVR) valve present in the aortic position. Procedure Date: 10/02/2019. FINDINGS  Left Ventricle: Left ventricular ejection fraction, by estimation, is 60 to 65%. The left ventricle has normal function. The left ventricle has no regional wall motion abnormalities. The left ventricular internal cavity size was normal in size. There is  mild concentric left ventricular hypertrophy. Right Ventricle: The right ventricular size is normal. No increase in right ventricular wall thickness. Right ventricular systolic function is normal. Left Atrium: Left atrial size was normal in size. Right Atrium: Right atrial size was normal in size. Pericardium: There is no evidence of pericardial effusion. Mitral Valve: The mitral valve is normal in structure. Mild mitral valve regurgitation, with centrally-directed jet. Tricuspid Valve: The tricuspid valve is normal in structure. Tricuspid valve regurgitation is not demonstrated. Aortic Valve: Aortic valve mean gradient  measures 4.0 mmHg. Aortic valve peak gradient measures 8.6 mmHg. Aortic valve area, by VTI measures 3.06 cm. There is a 26 mm Sapien prosthetic, stented (TAVR) valve present in the aortic position. Procedure Date: 10/02/2019. Pulmonic Valve: The pulmonic valve was not assessed. Aorta: The aortic root is normal in size and structure. IAS/Shunts: The interatrial septum was not assessed. LEFT VENTRICLE PLAX 2D LVOT diam:     2.20 cm LV SV:         98 LV SV Index:   53 LVOT Area:     3.80 cm  AORTIC VALVE AV Area (Vmax):    2.39 cm AV Area (Vmean):   2.35 cm AV Area (VTI):     3.06 cm AV Vmax:           147.00 cm/s AV Vmean:          94.800 cm/s AV VTI:            0.322 m AV Peak Grad:      8.6 mmHg AV Mean Grad:      4.0 mmHg LVOT Vmax:         92.40 cm/s LVOT Vmean:        58.500 cm/s LVOT VTI:          0.259 m LVOT/AV VTI ratio: 0.80  SHUNTS Systemic VTI:  0.26 m Systemic Diam: 2.20 cm Sanda Klein MD Electronically signed by Sanda Klein MD Signature Date/Time: 10/02/2019/5:59:13 PM    Final    Structural Heart Procedure  Result Date: 10/02/2019 See surgical note for result.  HYBRID OR IMAGING ([facility] ONLY)  Result Date: 10/02/2019 There is no interpretation for this exam.  This order is for images obtained during a surgical procedure.  Please See "Surgeries" Tab for more information regarding the procedure.  CT ANGIO ABDOMEN PELVIS  W &/OR WO CONTRAST  Result Date: 09/18/2019 CLINICAL DATA:  74 year old male with history of severe aortic stenosis. Preprocedural study prior to potential transcatheter aortic valve replacement (TAVR) procedure. EXAM: CT ANGIOGRAPHY CHEST, ABDOMEN AND PELVIS TECHNIQUE: Non-contrast CT of the chest was initially obtained. Multidetector CT imaging through the chest, abdomen and pelvis was performed using the standard protocol during bolus administration of intravenous contrast. Multiplanar reconstructed images and MIPs were obtained and reviewed to evaluate the vascular  anatomy. CONTRAST:  163mL OMNIPAQUE IOHEXOL 350 MG/ML SOLN COMPARISON:  CT of the chest, abdomen and pelvis 02/21/2019. FINDINGS: CTA CHEST FINDINGS Cardiovascular: Heart size is borderline enlarged with severe concentric left ventricular hypertrophy. There is no significant pericardial fluid, thickening or pericardial calcification. There is aortic atherosclerosis, as well as atherosclerosis of the great vessels of the mediastinum and the coronary arteries, including calcified atherosclerotic plaque in the left main, left anterior descending, left circumflex and right coronary arteries. Severe thickening calcification of the aortic valve. Mediastinum/Lymph Nodes: No pathologically enlarged mediastinal or hilar lymph nodes. There are no aggressive appearing lytic or blastic lesions noted in the visualized portions of the skeleton. No axillary lymphadenopathy. Lungs/Pleura: Status post right upper lobectomy with compensatory hyperexpansion of the right middle and lower lobes. Mild scattered scarring in the right lung, most evident basal segments of the right lower lobe. No acute consolidative airspace disease. No pleural effusions. A few scattered tiny pulmonary nodules are noted in the lungs bilaterally measuring 3 mm or less in size, similar to prior examinations. Diffuse bronchial wall thickening with mild centrilobular and paraseptal emphysema Musculoskeletal/Soft Tissues: There are no aggressive appearing lytic or blastic lesions noted in the visualized portions of the skeleton. CTA ABDOMEN AND PELVIS FINDINGS Hepatobiliary: Well-defined 3.9 x 3.2 cm low-attenuation lesion in segment 7 of the liver, compatible with a simple cyst. No suspicious hepatic lesions. No intra or extrahepatic biliary ductal dilatation. Gallbladder is normal in appearance. Pancreas: No pancreatic mass. No pancreatic ductal dilatation. No pancreatic or peripancreatic fluid collections or inflammatory changes. Spleen: Unremarkable.  Adrenals/Urinary Tract: Low-attenuation lesions in both kidneys compatible with simple cysts, largest of which is exophytic in the lower pole of the left kidney measuring 3.3 cm in diameter. Other subcentimeter low-attenuation lesions in both kidneys, too small to definitively characterize, but statistically likely to represent tiny cysts. Bilateral adrenal glands are normal in appearance. No hydroureteronephrosis. Urinary bladder is unremarkable in appearance. Stomach/Bowel: Normal appearance of the stomach. No pathologic dilatation of small bowel or colon. Numerous colonic diverticulae are noted, particularly in the descending colon and sigmoid colon, without surrounding inflammatory changes to suggest an acute diverticulitis at this time. Normal appendix. Vascular/Lymphatic: Aortic atherosclerosis, without evidence of aneurysm or dissection in the abdominal or pelvic vasculature. Vascular findings and measurements pertinent to potential TAVR procedure, as detailed below. No lymphadenopathy noted in the abdomen or pelvis. Reproductive: Brachytherapy implants within the prostate gland. Seminal vesicles are unremarkable in appearance. Other: No significant volume of ascites.  No pneumoperitoneum. Musculoskeletal: There are no aggressive appearing lytic or blastic lesions noted in the visualized portions of the skeleton. VASCULAR MEASUREMENTS PERTINENT TO TAVR: AORTA: Minimal Aortic Diameter-15 x 14 mm Severity of Aortic Calcification-moderate RIGHT PELVIS: Right Common Iliac Artery - Minimal Diameter-7.9 x 9.6 mm Tortuosity-mild-to-moderate Calcification-mild-to-moderate Right External Iliac Artery - Minimal Diameter-8.3 x 7.9 mm Tortuosity-severe Calcification-mild Right Common Femoral Artery - Minimal Diameter-9.2 x 8.8 mm Tortuosity-mild Calcification-mild LEFT PELVIS: Left Common Iliac Artery - Minimal Diameter-9.9 x 8.5  mm Tortuosity-mild Calcification-mild Left External Iliac Artery - Minimal Diameter-8.1 x  6.8 mm Tortuosity-moderate to severe Calcification-mild Left Common Femoral Artery - Minimal Diameter-8.2 x 7.4 mm Tortuosity-mild Calcification-moderate Review of the MIP images confirms the above findings. IMPRESSION: 1. Vascular findings and measurements pertinent to potential TAVR procedure, as detailed above. 2. Severe thickening calcification of the aortic valve, compatible with the reported clinical history of severe aortic stenosis. 3. Aortic atherosclerosis, in addition to left main and 3 vessel coronary artery disease. 4. Tiny pulmonary nodules measuring 3 mm or less in size, stable compared to the prior study from February 2021, strongly favored to be benign. No follow-up needed if patient is low-risk (and has no known or suspected primary neoplasm). Non-contrast chest CT can be considered in 12 months if patient is high-risk. This recommendation follows the consensus statement: Guidelines for Management of Incidental Pulmonary Nodules Detected on CT Images: From the Fleischner Society 2017; Radiology 2017; 284:228-243. 5. Colonic diverticulosis without evidence of acute diverticulitis at this time. 6. Additional incidental findings, as above. Electronically Signed   By: Vinnie Langton M.D.   On: 09/18/2019 10:53   Disposition   Pt is being discharged home today in good condition.  Follow-up Plans & Appointments     Follow-up Information    Eileen Stanford, PA-C. Go on 10/10/2019.   Specialties: Cardiology, Radiology Why: @ 2:30pm, please arrive at least 10 minutes early.  Contact information: [address] [place] [id number] [phone]                Discharge Medications   Allergies as of 10/03/2019      Reactions   Alfuzosin    Skin rash, burning sensation of skin   Cialis [tadalafil] Other (See Comments)   heartburn   Doxazosin    Skin rash, burning sensation of skin   Ephedrine Other (See Comments)   BP spikes   Finasteride    diarrhea     Flomax [tamsulosin Hcl]    Rash   Rapaflo [silodosin]    rash   Sulfonamide Derivatives    REACTION: as child      Medication List    TAKE these medications   aspirin 81 MG chewable tablet Chew 1 tablet (81 mg total) by mouth daily. Start taking on: October 04, 2019   clopidogrel 75 MG tablet Commonly known as: PLAVIX Take 1 tablet (75 mg total) by mouth daily with breakfast. Start taking on: October 04, 2019   eucerin cream Apply 1 application topically daily as needed for dry skin.   folic acid 1 MG tablet Commonly known as: FOLVITE Take 1 mg by mouth See admin instructions. Take 1 tablet (1 mg) by mouth on 6 days in the week, do NOT take on Mondays.   losartan 25 MG tablet Commonly known as: COZAAR Take 1 tablet (25 mg total) by mouth daily. What changed: when to take this   methotrexate 2.5 MG tablet Take 12.5 mg by mouth every Monday.   Systane Ultra 0.4-0.3 % Soln Generic drug: Polyethyl Glycol-Propyl Glycol Place 1 drop into both eyes daily as needed (dry/irritated eyes.).           Outstanding Labs/Studies   None   Duration of Discharge Encounter   Greater than 30 minutes including physician time.  Mable Fill, PA-C 10/03/2019, 9:35 AM [phone]  Patient seen, examined. Available data reviewed. Agree with findings, assessment, and plan as outlined by Nell Range, PA-C.  Patient  is doing very well.  Echo is currently being done at the bedside.  I have personally reviewed the images which show normal LV function, normal function of his TAVR prosthesis with no paravalvular leak and normal transvalvular gradients.  On exam, he is alert and oriented in no distress, JVP is normal, lungs are clear, heart is regular rate and rhythm with a 2/6 systolic murmur at the right upper sternal border, bilateral groin sites are clear, extremities have no edema.  Telemetry shows normal sinus rhythm without arrhythmia.  His blood pressure has been  running high overnight.  We will give him 25 mg of losartan this morning (home dose) and have him take an additional 25 mg losartan tonight which is his normal dosing schedule.  Follow-up next week as outlined above.  Medically stable for discharge today on dual antiplatelet therapy with aspirin and clopidogrel.  Sherren Mocha, M.D. 10/03/2019 10:43 AM

## 2019-10-03 NOTE — Progress Notes (Signed)
Mobility Specialist: Progress Note   10/03/19 1313  Mobility  Activity  (Cancel)   Pt prepping for discharge.   [facility] Nitara Szczerba Mobility Specialist

## 2019-10-04 ENCOUNTER — Telehealth: Payer: Self-pay | Admitting: Physician Assistant

## 2019-10-04 MED FILL — Magnesium Sulfate Inj 50%: INTRAMUSCULAR | Qty: 10 | Status: AC

## 2019-10-04 MED FILL — Potassium Chloride Inj 2 mEq/ML: INTRAVENOUS | Qty: 40 | Status: AC

## 2019-10-04 MED FILL — Heparin Sodium (Porcine) Inj 1000 Unit/ML: INTRAMUSCULAR | Qty: 30 | Status: AC

## 2019-10-04 NOTE — Telephone Encounter (Signed)
  [place] [facility] VALVE TEAM   Patient contacted regarding discharge from [facility] on 9/22  Patient understands to follow up with provider Nell Range on 9/29 at [facility].  Patient understands discharge instructions? yes Patient understands medications and regimen? yes Patient understands to bring all medications to this visit? Yes  Having some intermittent dizziness that preceded TAVR. Otherwise no complaints.   Angelena Form PA-C  [facility]

## 2019-10-06 ENCOUNTER — Telehealth: Payer: Self-pay | Admitting: Medical

## 2019-10-06 NOTE — Telephone Encounter (Signed)
Patient called to report he ahd recent [facility] 9/21 and was discharged 9/22 and is now having bruising at the left access site. It started last night and this morning was progressing into the penis. He says its about 2 inches around. Not firm or swollen. No pain. Denies back pain, CP, SOB, fever, chills. Has chronic lightheadedness that is unchanged. Right side is stable. I recommended he place pressure dressing on it. Also if it becomes swollen or painful to go to the ER. Will let Dr. Burt Knack know.   Britteny Fiebelkorn Kathlen Mody, PA-C

## 2019-10-10 ENCOUNTER — Encounter: Payer: Self-pay | Admitting: Physician Assistant

## 2019-10-10 ENCOUNTER — Other Ambulatory Visit: Payer: Self-pay

## 2019-10-10 ENCOUNTER — Ambulatory Visit (INDEPENDENT_AMBULATORY_CARE_PROVIDER_SITE_OTHER): Payer: Medicare Other | Admitting: Physician Assistant

## 2019-10-10 ENCOUNTER — Other Ambulatory Visit: Payer: Self-pay | Admitting: Physician Assistant

## 2019-10-10 VITALS — BP 140/80 | HR 64 | Ht 68.5 in | Wt 165.0 lb

## 2019-10-10 DIAGNOSIS — I779 Disorder of arteries and arterioles, unspecified: Secondary | ICD-10-CM | POA: Diagnosis not present

## 2019-10-10 DIAGNOSIS — I10 Essential (primary) hypertension: Secondary | ICD-10-CM | POA: Diagnosis not present

## 2019-10-10 DIAGNOSIS — R911 Solitary pulmonary nodule: Secondary | ICD-10-CM | POA: Diagnosis not present

## 2019-10-10 DIAGNOSIS — Z952 Presence of prosthetic heart valve: Secondary | ICD-10-CM | POA: Diagnosis not present

## 2019-10-10 MED ORDER — ATORVASTATIN CALCIUM 10 MG PO TABS: 10.0000 mg | ORAL_TABLET | Freq: Every day | ORAL | 3 refills | Status: DC

## 2019-10-10 NOTE — Patient Instructions (Signed)
Medication Instructions:  1) START ATORVASTATIN 10 mg daily *If you need a refill on your cardiac medications before your next appointment, please call your pharmacy*   Follow-Up: Please keep your upcoming appointments as scheduled!

## 2019-10-10 NOTE — Progress Notes (Signed)
[facility]                                       Cardiology Office Note    Date:  10/10/2019   ID:  Brett Mcguire, DOB Oct 14, 1945, MRN [id number]  PCP:  Tonia Ghent, MD  Cardiologist: Ida Rogue, MD / Dr. Burt Knack & Dr. Roxy Manns (TAVR)  CC: TOC s/p TAVR  History of Present Illness:  Brett Mcguire is a 74 y.o. 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   Patient states that he was first discovered to have a heart murmur on physical exam approximately 10 years ago. He was initially followed by his primary care physician and later referred to Dr. [name] who has been following him regularly ever since. Over the last several years the patient has experienced a gradual decline in his exercise tolerance with progressive exertional shortness of breath and increased fatigue. Recent follow-up transthoracic echocardiogram performed August 22, 2019 revealed significant progression of disease and severe aortic stenosis. Peak velocity across the aortic valve measured as high as 4.64m/s corresponding to mean transvalvular gradient estimated 66mmHg.Left ventricular systolic function appeared normal with ejection fraction estimated 60 to 65%. Diagnostic cardiac catheterization was performed by Dr. [name] September 06, 2019 and revealed normal coronary artery anatomy with no significant coronary artery disease. Peak to peak and mean transvalvular gradients across the aortic valve were reported 66 and 41 mmHg respectively. Pulmonary artery pressures were normal.   He was evaluated by the multidisciplinary valve team and underwent successful TAVR with a 26 mm Edwards Sapien 3 THV via the TF approach on 10/01/20. Post operative echo today shows EF 70%, normally functioning  TAVR with a mean gradient of 16 mm hg and no PVL. He was discharged on aspirin and plavix.   Today he presents to clinic for follow up. Doing well. Still has some dyspnea that he thinks might be related to his lung lobectomy. No CP. No LE edema, orthopnea or PND. He has occasional dizziness but no syncope. Dizziness preceded TAVR. No blood in stool or urine. No palpitations.    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 Burt Knack and Dr. Roxy Manns   Severe aortic stenosis     Past Surgical History:  Procedure Laterality Date   COLONOSCOPY  02/18/2000   Polyp, diverticulosis, repeat in 5 years   COLONOSCOPY  03/03/2006   Repeat  -  Divertics   COLONOSCOPY WITH PROPOFOL N/A 12/22/2017   Procedure: COLONOSCOPY WITH PROPOFOL;  Surgeon: Virgel Manifold, MD;  Location: [facility];  Service: Endoscopy;  Laterality: N/A;   CYSTOSCOPY WITH INSERTION OF UROLIFT     Laminectomy/Discectomy  11/03/2005   L5/S1   Malignant melanoma excision  06/20/2003   Dr. Nehemiah Massed   MRI  10/09/2005   Lumbar spine, bulging disc L5/S1   Right lobectomy  1989   Lung cancer   RIGHT/LEFT HEART CATH AND CORONARY ANGIOGRAPHY Bilateral 09/06/2019   Procedure: RIGHT/LEFT HEART CATH AND CORONARY ANGIOGRAPHY;  Surgeon: Minna Merritts, MD;  Location: [facility];  Service: Cardiovascular;  Laterality: Bilateral;   TEE WITHOUT CARDIOVERSION N/A 10/02/2019   Procedure: TRANSESOPHAGEAL  ECHOCARDIOGRAM (TEE);  Surgeon: Sherren Mocha, MD;  Location: [facility];  Service: Open Heart Surgery;  Laterality: N/A;   TONSILLECTOMY AND ADENOIDECTOMY     TRANSCATHETER AORTIC VALVE REPLACEMENT, TRANSFEMORAL  10/02/2019   Transcatheter Aortic Valve Replacement - Percutaneous Left Transfemoral Approach   TRANSCATHETER AORTIC VALVE REPLACEMENT, TRANSFEMORAL N/A 10/02/2019   Procedure: TRANSCATHETER AORTIC VALVE REPLACEMENT, TRANSFEMORAL USING EDWARDS SAPIEN 3 ULTRA 26 MM THV.;  Surgeon: Sherren Mocha, MD;  Location: [facility];  Service: Open Heart Surgery;  Laterality: N/A;    Current Medications: Outpatient Medications Prior to Visit  Medication Sig Dispense Refill   amoxicillin (AMOXIL) 500 MG capsule Take 2,000 mg by mouth as directed. Take 2,000 mg one hour prior to all dental visits.     aspirin 81 MG chewable tablet Chew 1 tablet (81 mg total) by mouth daily.     clopidogrel (PLAVIX) 75 MG tablet Take 1 tablet (75 mg total) by mouth daily with breakfast. 90 tablet 1   folic acid (FOLVITE) 1 MG tablet Take 1 mg by mouth See admin instructions. Take 1 tablet (1 mg) by mouth on 6 days in the week, do NOT take on Mondays.     losartan (COZAAR) 25 MG tablet Take 1 tablet (25 mg total) by mouth daily. 90 tablet 3   methotrexate 2.5 MG tablet Take 12.5 mg by mouth every Monday.      Polyethyl Glycol-Propyl Glycol (SYSTANE ULTRA) 0.4-0.3 % SOLN Place 1 drop into both eyes daily as needed (dry/irritated eyes.).      Skin Protectants, Misc. (EUCERIN) cream Apply 1 application topically daily as needed for dry skin.     No facility-administered medications prior to visit.     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 [place]    Comment: [employer]  Tobacco Use   Smoking status: Former Smoker    Packs/day: 2.00    Years: 17.00    Pack years: 34.00    Types: Cigarettes    Quit date: 02/22/1982    Years since quitting: 37.6   Smokeless tobacco: Never Used  Vaping Use   Vaping Use: Never used  Substance and Sexual Activity   Alcohol use: Not Currently    Alcohol/week: 10.0 standard drinks    Types: 10 Glasses of wine per week    Comment: wine   Drug use: No   Sexual activity: Yes  Other Topics Concern   Not on file  Social History Narrative   From [place]  Retired to [employer], ran a Arboriculturist.    Married 40+ years   Social Determinants of Radio broadcast assistant Strain:    Difficulty of Paying Living Expenses: Not on file  Food Insecurity:    Worried About Charity fundraiser in the Last Year: Not on file   [employer] of Food in the Last Year: Not on file  Transportation Needs:    Lack of Transportation (Medical): Not on file   Lack of Transportation (Non-Medical): Not on file  Physical Activity:    Days of Exercise per Week: Not on file   Minutes of Exercise per Session: Not on file  Stress:    Feeling of Stress : Not on file  Social Connections:    Frequency of Communication with Friends and Family: Not on file   Frequency of Social Gatherings with Friends and Family: Not on file   Attends Religious Services: Not on file   Active Member of Clubs or Organizations: Not on file   Attends Archivist Meetings: Not on file   Marital Status: Not on file     Family History:  The patient's family history includes Alcohol abuse in his mother; Cancer in his father, mother, and another family member; Prostate cancer in his father.     ROS:   Please see the history of present illness.    ROS All other systems  reviewed and are negative.   PHYSICAL EXAM:   VS:  BP 140/80    Pulse 64    Ht 5' 8.5" (1.74 m)    Wt 165 lb (74.8 kg)    SpO2 96%    BMI 24.72 kg/m    GEN: Well nourished, well developed, in no acute distress HEENT: normal Neck: no JVD or masses Cardiac: RRR; soft flow murmur @ RUSB. No rubs, or gallops,no edema  Respiratory:  clear to auscultation bilaterally, normal work of breathing GI: soft, nontender, nondistended, + BS MS: no deformity or atrophy Skin: warm and dry, no rash.  Groin sites clear without hematoma. Mild ecchymosis s Neuro:  Alert and Oriented x 3, Strength and sensation are intact Psych: euthymic mood, full affect   Wt Readings from Last 3 Encounters:  10/10/19 165 lb (74.8 kg)  10/03/19 158 lb 15.2 oz (72.1 kg)  09/28/19 163 lb 11.2 oz (74.3 kg)      Studies/Labs Reviewed:   EKG:  EKG is ordered today.  The ekg ordered today demonstrates sinus with HR 64  Recent Labs: 01/11/2019: TSH 2.09 09/28/2019: ALT 16; B Natriuretic Peptide 64.8 10/03/2019: BUN 13; Creatinine, Ser 0.82; Hemoglobin 11.7; Magnesium 1.7; Platelets 205; Potassium 3.6; Sodium 130   Lipid Panel    Component Value Date/Time   CHOL 177 06/06/2018 1207   TRIG 142.0 06/06/2018 1207   HDL 53.40 06/06/2018 1207   CHOLHDL 3 06/06/2018 1207   VLDL 28.4 06/06/2018 1207   [place] 95 06/06/2018 1207   LDLDIRECT 120.1 02/07/2006 0955    Additional studies/ records that were reviewed today include:  TAVR OPERATIVE NOTE   Date of Procedure:10/02/2019  Preoperative Diagnosis:Severe Aortic Stenosis   Postoperative Diagnosis:Same   Procedure:   Transcatheter Aortic Valve Replacement - PercutaneousLeftTransfemoral Approach Edwards Sapien 3 Ultra THV (size 69mm, model # 9750TFX, serial # [id number])  Co-Surgeons:Clarence H. Roxy Manns, MD and Sherren Mocha, MD  Anesthesiologist:Charlene  Nyoka Cowden, MD  Echocardiographer:Mihai Croitoru, MD  Pre-operative Echo Findings: ? Severe aortic stenosis ? Normalleft ventricular systolic  function  Post-operative Echo Findings: ? Trivialparavalvular leak ? Normalleft ventricular systolic function  _____________    Echo 10/03/19:  IMPRESSIONS  1. Left ventricular ejection fraction, by estimation, is 70 to 75%. The left ventricle has hyperdynamic function. The left ventricle has no  regional wall motion abnormalities. Left ventricular diastolic parameters  are consistent with Grade I diastolic  dysfunction (impaired relaxation).  2. Right ventricular systolic function is normal. The right ventricular  size is normal.  3. Left atrial size was mildly dilated.  4. The mitral valve is normal in structure. No evidence of mitral valve  regurgitation. No evidence of mitral stenosis.  5. The aortic valve has been repaired/replaced. Aortic valve  regurgitation is not visualized. No aortic stenosis is present. There is a  Sapien prosthetic (TAVR) valve present in the aortic position. Procedure  Date: 10/02/2019. Aortic valve mean gradient  measures 16.5 mmHg. Aortic valve Vmax measures 2.82 m/s.  6. The inferior vena cava is normal in size with greater than 50%  respiratory variability, suggesting right atrial pressure of 3 mmHg.   ASSESSMENT & PLAN:   Severe AS s/p TAVR:doing well. Groin sites healing well. ECG with no HAVB. SBE prophylaxis discussed; he gets amoxicillin from his dentist. Continue on aspirin and plavix. I will see him back next month for follow up and echo.    HTN: BP borderline elevated today. No changes made.   Carotid disease: pre TAVR dopplers showed 40-59% stenosis bilaterally. I have added atova 10mg  daily.   Pulmonary nodule: pre TAVR CT showed a tiny pulmonary nodules measuring 3 mm or less in size, stable compared to the prior study from February 2021, strongly favored to be  benign. No follow-up needed if patient is low-risk (and has no known or suspected primary neoplasm). Non-contrast chest CT can be considered in 12 months if patient is high-risk. Pt is high-risk with Hx of lung CA and this will be arranged for 12 months.    Medication Adjustments/Labs and Tests Ordered: Current medicines are reviewed at length with the patient today.  Concerns regarding medicines are outlined above.  Medication changes, Labs and Tests ordered today are listed in the Patient Instructions below. Patient Instructions  Medication Instructions:  1) START ATORVASTATIN 10 mg daily *If you need a refill on your cardiac medications before your next appointment, please call your pharmacy*   Follow-Up: Please keep your upcoming appointments as scheduled!    Signed, Angelena Form, PA-C  10/10/2019 3:47 PM    [facility] [address], [place], [place]  [address] Phone: [phone]; Fax: [phone]

## 2019-10-12 IMAGING — DX RIGHT SHOULDER - 2+ VIEW
3 series · 3 of 3 positions shown · non-contrast
Comparison: None.

CLINICAL DATA: Right shoulder pain

EXAM:
RIGHT SHOULDER - 2+ VIEW

[shoulder axial]
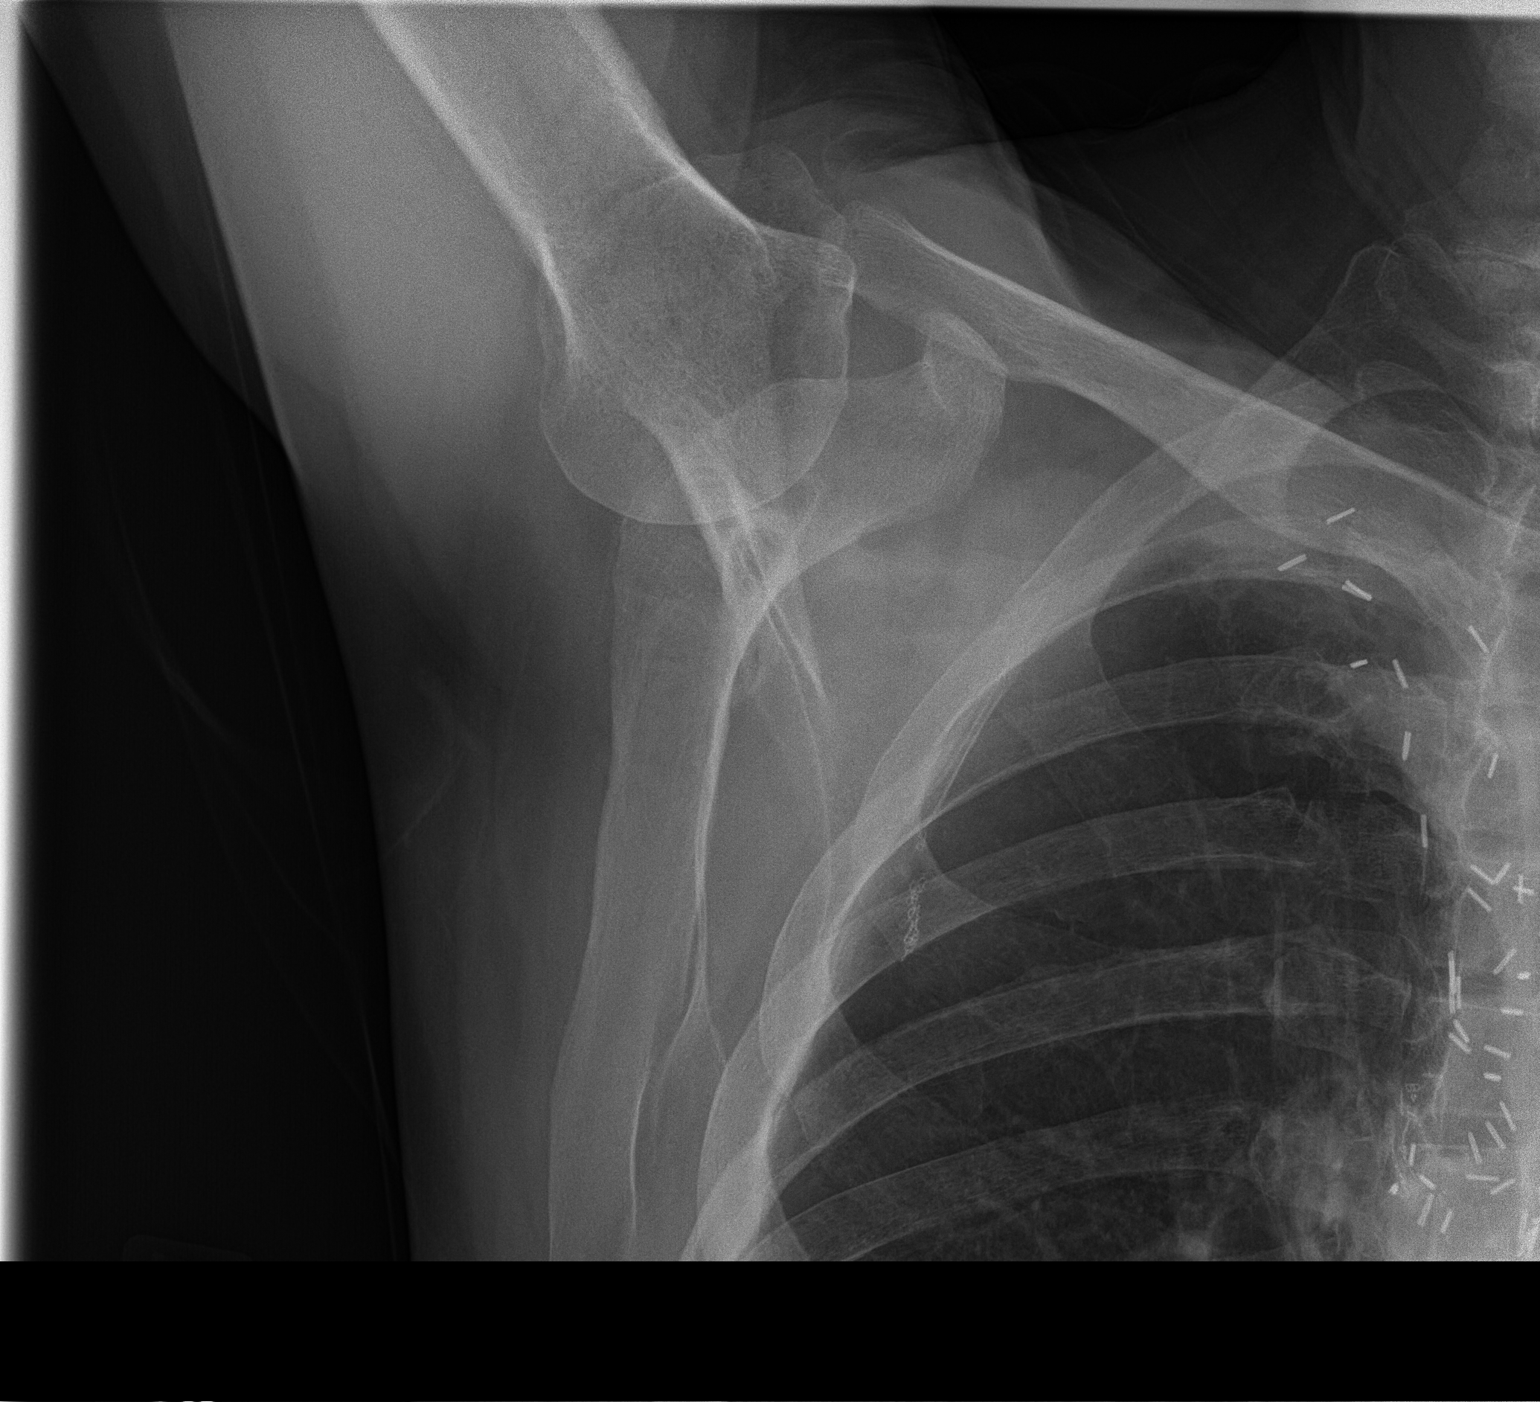

[shoulder ap]
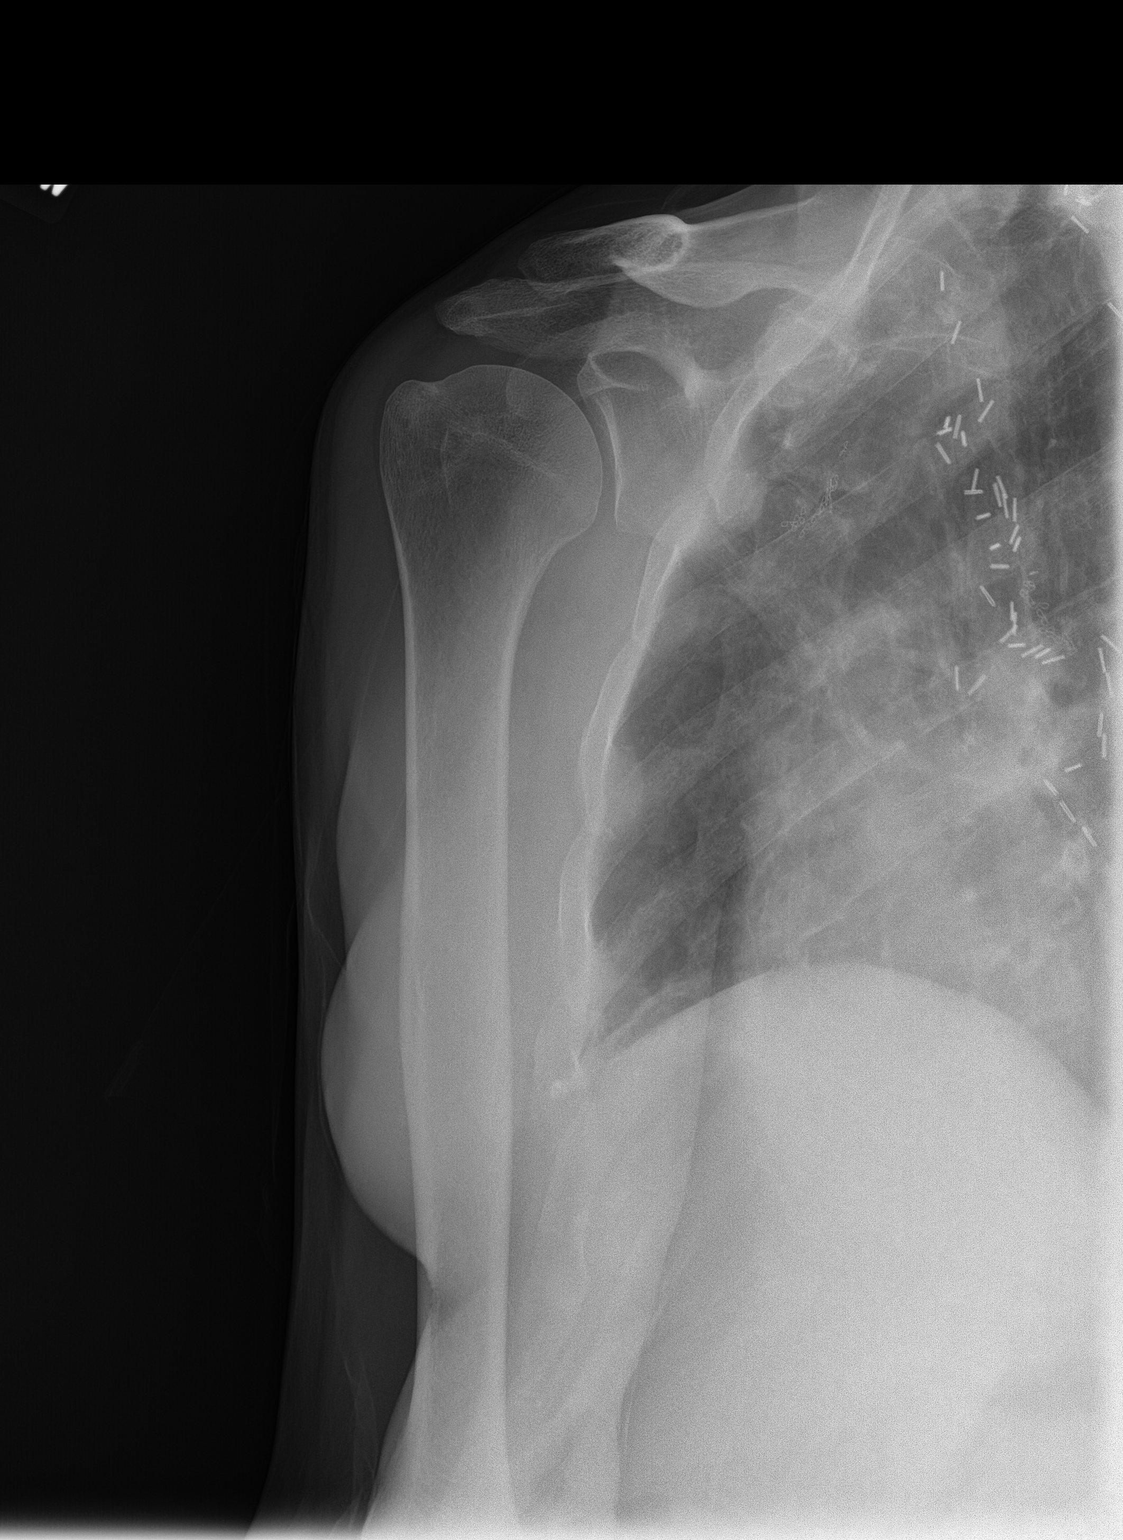

[shoulder y-view]
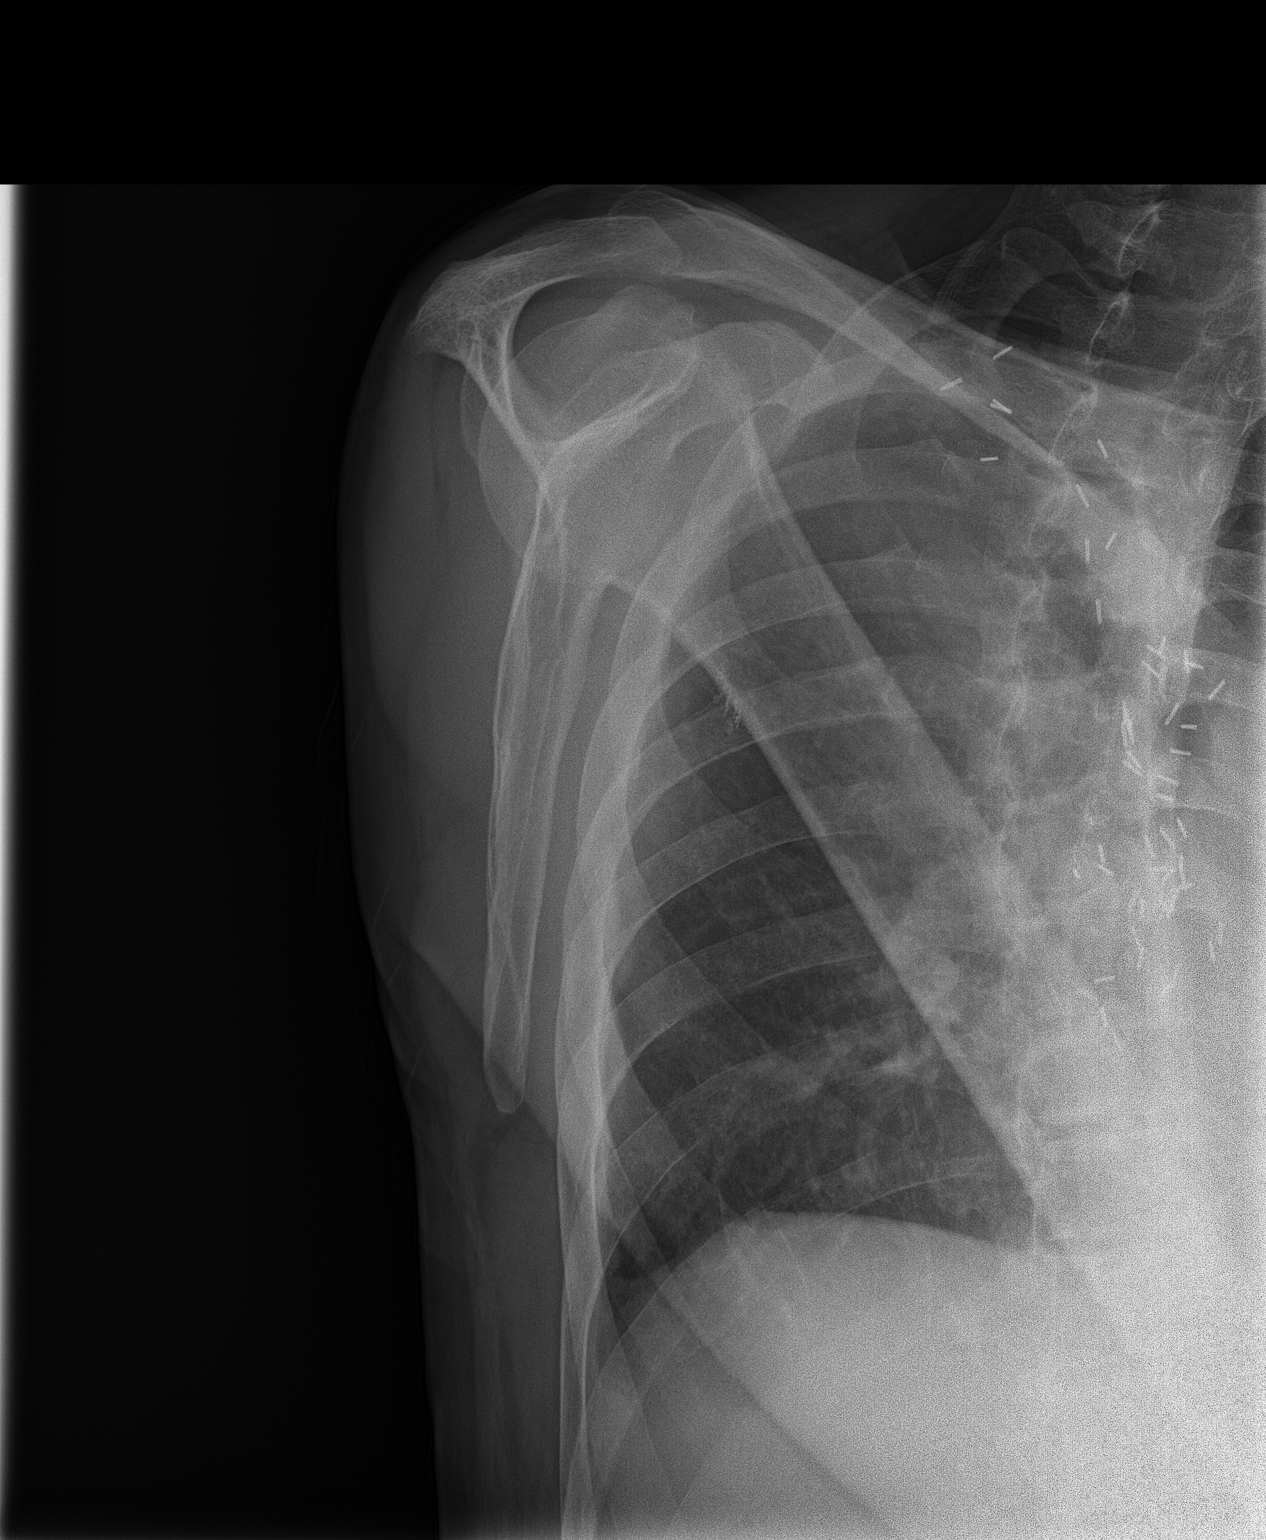

[3 of 3 positions shown; findings below may reference images not displayed]

FINDINGS: Early osteoarthritis in the right AC joint with joint space
narrowing. Glenohumeral joint is maintained. No acute bony
abnormality. Specifically, no fracture, subluxation, or dislocation.
IMPRESSION: Early arthritic changes in the right AC joint. No acute bony
abnormality.

## 2019-10-18 DIAGNOSIS — N401 Enlarged prostate with lower urinary tract symptoms: Secondary | ICD-10-CM | POA: Diagnosis not present

## 2019-10-19 DIAGNOSIS — N401 Enlarged prostate with lower urinary tract symptoms: Secondary | ICD-10-CM | POA: Diagnosis not present

## 2019-10-19 DIAGNOSIS — R972 Elevated prostate specific antigen [PSA]: Secondary | ICD-10-CM | POA: Diagnosis not present

## 2019-10-23 DIAGNOSIS — R351 Nocturia: Secondary | ICD-10-CM | POA: Diagnosis not present

## 2019-10-23 DIAGNOSIS — N401 Enlarged prostate with lower urinary tract symptoms: Secondary | ICD-10-CM | POA: Diagnosis not present

## 2019-10-23 DIAGNOSIS — D400 Neoplasm of uncertain behavior of prostate: Secondary | ICD-10-CM | POA: Diagnosis not present

## 2019-10-23 DIAGNOSIS — R972 Elevated prostate specific antigen [PSA]: Secondary | ICD-10-CM | POA: Diagnosis not present

## 2019-11-07 DIAGNOSIS — D2261 Melanocytic nevi of right upper limb, including shoulder: Secondary | ICD-10-CM | POA: Diagnosis not present

## 2019-11-07 DIAGNOSIS — D2262 Melanocytic nevi of left upper limb, including shoulder: Secondary | ICD-10-CM | POA: Diagnosis not present

## 2019-11-07 DIAGNOSIS — L2084 Intrinsic (allergic) eczema: Secondary | ICD-10-CM | POA: Diagnosis not present

## 2019-11-07 DIAGNOSIS — Z85820 Personal history of malignant melanoma of skin: Secondary | ICD-10-CM | POA: Diagnosis not present

## 2019-11-07 DIAGNOSIS — L538 Other specified erythematous conditions: Secondary | ICD-10-CM | POA: Diagnosis not present

## 2019-11-07 DIAGNOSIS — B078 Other viral warts: Secondary | ICD-10-CM | POA: Diagnosis not present

## 2019-11-07 DIAGNOSIS — D485 Neoplasm of uncertain behavior of skin: Secondary | ICD-10-CM | POA: Diagnosis not present

## 2019-11-07 DIAGNOSIS — L820 Inflamed seborrheic keratosis: Secondary | ICD-10-CM | POA: Diagnosis not present

## 2019-11-07 DIAGNOSIS — L821 Other seborrheic keratosis: Secondary | ICD-10-CM | POA: Diagnosis not present

## 2019-11-07 DIAGNOSIS — D225 Melanocytic nevi of trunk: Secondary | ICD-10-CM | POA: Diagnosis not present

## 2019-11-07 DIAGNOSIS — Z79899 Other long term (current) drug therapy: Secondary | ICD-10-CM | POA: Diagnosis not present

## 2019-11-08 ENCOUNTER — Other Ambulatory Visit: Payer: Self-pay

## 2019-11-08 ENCOUNTER — Other Ambulatory Visit (HOSPITAL_COMMUNITY): Payer: Medicare Other

## 2019-11-08 ENCOUNTER — Emergency Department: Payer: Medicare Other

## 2019-11-08 ENCOUNTER — Observation Stay: Payer: Medicare Other

## 2019-11-08 ENCOUNTER — Ambulatory Visit: Payer: Medicare Other | Admitting: Physician Assistant

## 2019-11-08 ENCOUNTER — Observation Stay
Admission: EM | Admit: 2019-11-08 | Discharge: 2019-11-09 | Disposition: A | Discharge status: Home / Self Care | Payer: Medicare Other | Attending: Hospitalist | Admitting: Hospitalist

## 2019-11-08 DIAGNOSIS — E871 Hypo-osmolality and hyponatremia: Secondary | ICD-10-CM | POA: Diagnosis not present

## 2019-11-08 DIAGNOSIS — E782 Mixed hyperlipidemia: Secondary | ICD-10-CM | POA: Diagnosis not present

## 2019-11-08 DIAGNOSIS — Z20822 Contact with and (suspected) exposure to covid-19: Secondary | ICD-10-CM | POA: Insufficient documentation

## 2019-11-08 DIAGNOSIS — R823 Hemoglobinuria: Secondary | ICD-10-CM

## 2019-11-08 DIAGNOSIS — R519 Headache, unspecified: Secondary | ICD-10-CM | POA: Diagnosis not present

## 2019-11-08 DIAGNOSIS — M25511 Pain in right shoulder: Secondary | ICD-10-CM | POA: Diagnosis not present

## 2019-11-08 DIAGNOSIS — I6523 Occlusion and stenosis of bilateral carotid arteries: Secondary | ICD-10-CM | POA: Diagnosis not present

## 2019-11-08 DIAGNOSIS — Z79899 Other long term (current) drug therapy: Secondary | ICD-10-CM | POA: Insufficient documentation

## 2019-11-08 DIAGNOSIS — R4781 Slurred speech: Secondary | ICD-10-CM | POA: Diagnosis not present

## 2019-11-08 DIAGNOSIS — I10 Essential (primary) hypertension: Secondary | ICD-10-CM | POA: Diagnosis not present

## 2019-11-08 DIAGNOSIS — G459 Transient cerebral ischemic attack, unspecified: Secondary | ICD-10-CM | POA: Diagnosis not present

## 2019-11-08 DIAGNOSIS — Z87891 Personal history of nicotine dependence: Secondary | ICD-10-CM | POA: Insufficient documentation

## 2019-11-08 DIAGNOSIS — I350 Nonrheumatic aortic (valve) stenosis: Secondary | ICD-10-CM

## 2019-11-08 DIAGNOSIS — R410 Disorientation, unspecified: Secondary | ICD-10-CM | POA: Diagnosis not present

## 2019-11-08 DIAGNOSIS — Z7982 Long term (current) use of aspirin: Secondary | ICD-10-CM | POA: Insufficient documentation

## 2019-11-08 DIAGNOSIS — E785 Hyperlipidemia, unspecified: Secondary | ICD-10-CM | POA: Diagnosis present

## 2019-11-08 LAB — URINALYSIS, COMPLETE (UACMP) WITH MICROSCOPIC
Bacteria, UA: NONE SEEN
Bilirubin Urine: NEGATIVE
Glucose, UA: NEGATIVE mg/dL
Ketones, ur: NEGATIVE mg/dL
Leukocytes,Ua: NEGATIVE
Nitrite: NEGATIVE
Protein, ur: NEGATIVE mg/dL
Specific Gravity, Urine: 1.017 (ref 1.005–1.030)
Squamous Epithelial / HPF: NONE SEEN (ref 0–5)
pH: 5 (ref 5.0–8.0)

## 2019-11-08 LAB — COMPREHENSIVE METABOLIC PANEL
ALT: 19 U/L (ref 0–44)
AST: 22 U/L (ref 15–41)
Albumin: 4 g/dL (ref 3.5–5.0)
Alkaline Phosphatase: 61 U/L (ref 38–126)
Anion gap: 8 (ref 5–15)
BUN: 21 mg/dL (ref 8–23)
CO2: 24 mmol/L (ref 22–32)
Calcium: 9.2 mg/dL (ref 8.9–10.3)
Chloride: 99 mmol/L (ref 98–111)
Creatinine, Ser: 1.01 mg/dL (ref 0.61–1.24)
GFR, Estimated: >60 mL/min (ref >60)
Glucose, Bld: 113 mg/dL — ABNORMAL HIGH (ref 70–99)
Potassium: 4.5 mmol/L (ref 3.5–5.1)
Sodium: 131 mmol/L — ABNORMAL LOW (ref 135–145)
Total Bilirubin: 0.7 mg/dL (ref 0.3–1.2)
Total Protein: 7 g/dL (ref 6.5–8.1)

## 2019-11-08 LAB — RESPIRATORY PANEL BY RT PCR (FLU A&B, COVID)
Influenza A by PCR: NEGATIVE
Influenza B by PCR: NEGATIVE
SARS Coronavirus 2 by RT PCR: NEGATIVE

## 2019-11-08 LAB — CBC WITH DIFFERENTIAL/PLATELET
Abs Immature Granulocytes: 0.01 10*3/uL (ref 0.00–0.07)
Basophils Absolute: 0 10*3/uL (ref 0.0–0.1)
Basophils Relative: 1 %
Eosinophils Absolute: 0.1 10*3/uL (ref 0.0–0.5)
Eosinophils Relative: 1 %
HCT: 34.6 % — ABNORMAL LOW (ref 39.0–52.0)
Hemoglobin: 12 g/dL — ABNORMAL LOW (ref 13.0–17.0)
Immature Granulocytes: 0 %
Lymphocytes Relative: 23 %
Lymphs Abs: 1 10*3/uL (ref 0.7–4.0)
MCH: 31.8 pg (ref 26.0–34.0)
MCHC: 34.7 g/dL (ref 30.0–36.0)
MCV: 91.8 fL (ref 80.0–100.0)
Monocytes Absolute: 0.6 10*3/uL (ref 0.1–1.0)
Monocytes Relative: 13 %
Neutro Abs: 2.6 10*3/uL (ref 1.7–7.7)
Neutrophils Relative %: 62 %
Platelets: 253 10*3/uL (ref 150–400)
RBC: 3.77 MIL/uL — ABNORMAL LOW (ref 4.22–5.81)
RDW: 13.9 % (ref 11.5–15.5)
WBC: 4.3 10*3/uL (ref 4.0–10.5)
nRBC: 0 % (ref 0.0–0.2)

## 2019-11-08 LAB — LIPID PANEL
Cholesterol: 152 mg/dL (ref 0–200)
HDL: 48 mg/dL (ref >40)
Total CHOL/HDL Ratio: 3.2 RATIO
Triglycerides: 117 mg/dL (ref <150)
VLDL: 23 mg/dL (ref 0–40)

## 2019-11-08 LAB — TSH: TSH: 1.626 u[IU]/mL (ref 0.350–4.500)

## 2019-11-08 MED ORDER — ONDANSETRON HCL 4 MG/2ML IJ SOLN: 4.0000 mg | Freq: Four times a day (QID) | INTRAMUSCULAR | Status: DC | PRN

## 2019-11-08 MED ORDER — PROCHLORPERAZINE EDISYLATE 10 MG/2ML IJ SOLN
10.0000 mg | Freq: Once | INTRAMUSCULAR | Status: AC
  Administered 2019-11-08: 10 mg via INTRAVENOUS
  Filled 2019-11-08: qty 2

## 2019-11-08 MED ORDER — ACETAMINOPHEN 325 MG PO TABS: 650.0000 mg | ORAL_TABLET | Freq: Four times a day (QID) | ORAL | Status: DC | PRN

## 2019-11-08 MED ORDER — ACETAMINOPHEN 500 MG PO TABS
1000.0000 mg | ORAL_TABLET | Freq: Once | ORAL | Status: AC
  Administered 2019-11-08: 1000 mg via ORAL
  Filled 2019-11-08: qty 2

## 2019-11-08 MED ORDER — STROKE: EARLY STAGES OF RECOVERY BOOK: Freq: Once | Status: DC

## 2019-11-08 MED ORDER — LABETALOL HCL 5 MG/ML IV SOLN: 10.0000 mg | INTRAVENOUS | Status: DC | PRN

## 2019-11-08 MED ORDER — CLOPIDOGREL BISULFATE 75 MG PO TABS
75.0000 mg | ORAL_TABLET | Freq: Every day | ORAL | Status: DC
  Administered 2019-11-09: 75 mg via ORAL
  Filled 2019-11-08: qty 1

## 2019-11-08 MED ORDER — MELATONIN 5 MG PO TABS
2.5000 mg | ORAL_TABLET | Freq: Every evening | ORAL | Status: DC | PRN
  Filled 2019-11-08: qty 0.5

## 2019-11-08 MED ORDER — LACTATED RINGERS IV BOLUS: 1000.0000 mL | Freq: Once | INTRAVENOUS | Status: DC

## 2019-11-08 MED ORDER — ASPIRIN 81 MG PO CHEW
81.0000 mg | CHEWABLE_TABLET | Freq: Every day | ORAL | Status: DC
  Administered 2019-11-09: 81 mg via ORAL
  Filled 2019-11-08: qty 1

## 2019-11-08 MED ORDER — ASPIRIN 81 MG PO CHEW: 324.0000 mg | CHEWABLE_TABLET | Freq: Once | ORAL | Status: DC

## 2019-11-08 MED ORDER — ACETAMINOPHEN 650 MG RE SUPP: 650.0000 mg | Freq: Four times a day (QID) | RECTAL | Status: DC | PRN

## 2019-11-08 MED ORDER — LABETALOL HCL 5 MG/ML IV SOLN
10.0000 mg | Freq: Once | INTRAVENOUS | Status: AC
  Administered 2019-11-08: 10 mg via INTRAVENOUS
  Filled 2019-11-08: qty 4

## 2019-11-08 MED ORDER — ATORVASTATIN CALCIUM 10 MG PO TABS
10.0000 mg | ORAL_TABLET | Freq: Every day | ORAL | Status: DC
  Administered 2019-11-09: 10 mg via ORAL
  Filled 2019-11-08: qty 1

## 2019-11-08 NOTE — ED Notes (Signed)
Pt ambulatory to desk with wife reports altered speech and confusion that lasted approx 20 minutes. LNW 115PM today wife reports he had a type of lisping sound when speaking and forgetful. Speech resolved after 20 minutes and is back to normal at this time. Pt alert and oriented at this time and speaking in clear and complete sentences without difficulty.
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
## 2019-11-08 NOTE — ED Provider Notes (Signed)
[facility] Emergency Department Provider Note  ____________________________________________   First MD Initiated Contact with Patient 11/08/19 1745     (approximate)  I have reviewed the triage vital signs and the nursing notes.   HISTORY  Chief Complaint Dizziness and Aphasia   HPI Brett Mcguire is a 74 y.o. male with a past medical history of lung cancer status post lumpectomy, adenoma, HTN, HDL, BPH, and aortic stenosis status post TAVR 10/02/2019 who presents for assessment of an episode of headache associated with slurred speech, forgetfulness, and pain in his right shoulder that occurred around 115 today while he was shopping.  Patient states initially had a headache which felt similar to headaches he has had in the past although his headaches are typically in the left with this one was on the right while he was shopping he forgot to check out some items in his cart and shortly thereafter could not speak properly and had slurred speech for approximately 20 minutes.  This was witnessed by his wife.  He states this resolved but he still has a little bit of a headache.  He states that during this time he had some pain in his right arm this is also better.  No similar prior episodes.  No clear alleviating aggravating or precipitating events.  Patient states he otherwise is in usual state of health without any recent fevers, chills, cough, chest pain, abdominal pain, back pain, nausea, vomiting, diarrhea, dysuria, rash, focal extremity weakness numbness or tingling or other acute complaints.  States he is currently on 31 of ASA daily and Plavix daily for his recent TAVR 2.  No history of CVA.  He is a remote tobacco abuse history and is not a daily drinker and denies illicit drug use.         Past Medical History:  Diagnosis Date  . Allergy 1989  . 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 BPH (benign prostatic hyperplasia)   . Carotid artery disease  ([place])   . Diverticulosis 02/2000  . Hyperlipidemia 1989  . Hypertension   . Incidental lung nodule, less than or equal to 15mm 09/26/2019  . Lung cancer ([place]) 1989   s/p lobectomy, no chemo or rady  . Melanoma of skin ([place])    chest  . Plaque psoriasis   . 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 Burt Knack and Dr. Roxy Manns  . Severe aortic stenosis     Patient Active Problem List   Diagnosis Date Noted  . 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 Incidental lung nodule, less than or equal to 79mm 09/26/2019  . Anemia 01/14/2019  . Benign neoplasm of cecum   . Polyp of sigmoid colon   . Internal hemorrhoids   . Bilateral carotid artery stenosis 05/07/2016  . Severe aortic stenosis 02/18/2015  . Vertigo 01/28/2015  . Olecranon bursitis 05/16/2014  . Essential hypertension 01/01/2014  . Malignant neoplasm of bronchus and lung ([place]) 04/25/2007  . MELANOMA 04/25/2007  . Hyperlipidemia 04/25/2007  . DIVERTICULOSIS, COLON 04/25/2007  . MICROSCOPIC HEMATURIA 04/25/2007  . HEMATOSPERMIA 04/25/2007    Past Surgical History:  Procedure Laterality Date  . COLONOSCOPY  02/18/2000   Polyp, diverticulosis, repeat in 5 years  . COLONOSCOPY  03/03/2006   Repeat  -  Divertics  . COLONOSCOPY WITH PROPOFOL N/A 12/22/2017   Procedure: COLONOSCOPY  WITH PROPOFOL;  Surgeon: Virgel Manifold, MD;  Location: [facility] ENDOSCOPY;  Service: Endoscopy;  Laterality: N/A;  . CYSTOSCOPY WITH INSERTION OF UROLIFT    . Laminectomy/Discectomy  11/03/2005   L5/S1  . Malignant melanoma excision  06/20/2003   Dr. Nehemiah Massed  . MRI  10/09/2005   Lumbar spine, bulging disc L5/S1  . Right lobectomy  1989   Lung cancer  . RIGHT/LEFT HEART CATH AND CORONARY ANGIOGRAPHY Bilateral 09/06/2019   Procedure: RIGHT/LEFT HEART CATH AND CORONARY ANGIOGRAPHY;  Surgeon: Minna Merritts, MD;  Location: [place] [facility];   Service: Cardiovascular;  Laterality: Bilateral;  . TEE WITHOUT CARDIOVERSION N/A 10/02/2019   Procedure: TRANSESOPHAGEAL ECHOCARDIOGRAM (TEE);  Surgeon: Sherren Mocha, MD;  Location: [place];  Service: Open Heart Surgery;  Laterality: N/A;  . TONSILLECTOMY AND ADENOIDECTOMY    . TRANSCATHETER AORTIC VALVE REPLACEMENT, TRANSFEMORAL  10/02/2019   Transcatheter Aortic Valve Replacement - Percutaneous Left Transfemoral Approach  . TRANSCATHETER AORTIC VALVE REPLACEMENT, TRANSFEMORAL N/A 10/02/2019   Procedure: TRANSCATHETER AORTIC VALVE REPLACEMENT, TRANSFEMORAL USING EDWARDS SAPIEN 3 ULTRA 26 MM THV.;  Surgeon: Sherren Mocha, MD;  Location: [place];  Service: Open Heart Surgery;  Laterality: N/A;    Prior to Admission medications   Medication Sig Start Date End Date Taking? Authorizing Provider  amoxicillin (AMOXIL) 500 MG capsule Take 2,000 mg by mouth as directed. Take 2,000 mg one hour prior to all dental visits.    [provider]  aspirin 81 MG chewable tablet Chew 1 tablet (81 mg total) by mouth daily. 10/04/19   Eileen Stanford, PA-C  atorvastatin (LIPITOR) 10 MG tablet Take 1 tablet (10 mg total) by mouth daily. 10/10/19 10/04/20  Eileen Stanford, PA-C  clopidogrel (PLAVIX) 75 MG tablet Take 1 tablet (75 mg total) by mouth daily with breakfast. 10/04/19   Eileen Stanford, PA-C  folic acid (FOLVITE) 1 MG tablet Take 1 mg by mouth See admin instructions. Take 1 tablet (1 mg) by mouth on 6 days in the week, do NOT take on Mondays. 08/25/19   [provider]  losartan (COZAAR) 25 MG tablet Take 1 tablet (25 mg total) by mouth daily. 07/23/19   Minna Merritts, MD  methotrexate 2.5 MG tablet Take 12.5 mg by mouth every Monday.  08/25/19   [provider]  Polyethyl Glycol-Propyl Glycol (SYSTANE ULTRA) 0.4-0.3 % SOLN Place 1 drop into both eyes daily as needed (dry/irritated eyes.).     [provider]  Skin Protectants, Misc. (EUCERIN) cream Apply 1  application topically daily as needed for dry skin.    [provider]    Allergies Alfuzosin, Cialis [tadalafil], Doxazosin, Ephedrine, Finasteride, Flomax [tamsulosin hcl], Rapaflo [silodosin], and Sulfonamide derivatives  Family History  Problem Relation Age of Onset  . Cancer Mother        [place] throat  . Alcohol abuse Mother   . Cancer Father        4 kinds; 5 places, prostate and lung  . Prostate cancer Father        possible prostate cancer  . Cancer Other        Skin, deceased in 03-25-1994  . Colon cancer Neg Hx        none known    Social History Social History   Tobacco Use  . Smoking status: Former Smoker    Packs/day: 2.00    Years: 17.00    Pack years: 34.00    Types: Cigarettes  Quit date: 02/22/1982    Years since quitting: 37.7  . Smokeless tobacco: Never Used  Vaping Use  . Vaping Use: Never used  Substance Use Topics  . Alcohol use: Not Currently    Alcohol/week: 10.0 standard drinks    Types: 10 Glasses of wine per week    Comment: wine  . Drug use: No    Review of Systems  Review of Systems  Constitutional: Negative for chills and fever.  HENT: Negative for sore throat.   Eyes: Negative for pain.  Respiratory: Negative for cough and stridor.   Cardiovascular: Negative for chest pain.  Gastrointestinal: Negative for vomiting.  Genitourinary: Negative for dysuria.  Musculoskeletal: Negative for myalgias.  Skin: Negative for rash.  Neurological: Negative for seizures, loss of consciousness and headaches.  Psychiatric/Behavioral: Negative for suicidal ideas.  All other systems reviewed and are negative.     ____________________________________________   PHYSICAL EXAM:  VITAL SIGNS: ED Triage Vitals  Enc Vitals Group     BP 11/08/19 1355 (!) 177/87     Pulse Rate 11/08/19 1355 75     Resp 11/08/19 1355 16     Temp 11/08/19 1355 98.6 F (37 C)     Temp Source 11/08/19 1355 Oral     SpO2 11/08/19 1355 98 %     Weight 11/08/19  1353 160 lb (72.6 kg)     Height 11/08/19 1353 5\' 9"  (1.753 m)     Head Circumference --      Peak Flow --      Pain Score 11/08/19 1353 2     Pain Loc --      Pain Edu? --      Excl. in [place]? --    Vitals:   11/08/19 1355 11/08/19 1748  BP: (!) 177/87 (!) 233/96  Pulse: 75 66  Resp: 16 18  Temp: 98.6 F (37 C) 97.8 F (36.6 C)  SpO2: 98% 99%   Physical Exam Vitals and nursing note reviewed.  Constitutional:      Appearance: He is well-developed.  HENT:     Head: Normocephalic and atraumatic.     Right Ear: External ear normal.     Left Ear: External ear normal.     Nose: Nose normal.  Eyes:     Conjunctiva/sclera: Conjunctivae normal.  Cardiovascular:     Rate and Rhythm: Normal rate and regular rhythm.     Heart sounds: No murmur heard.   Pulmonary:     Effort: Pulmonary effort is normal. No respiratory distress.     Breath sounds: Normal breath sounds.  Abdominal:     Palpations: Abdomen is soft.     Tenderness: There is no abdominal tenderness.  Musculoskeletal:     Cervical back: Neck supple.     Right lower leg: No edema.     Left lower leg: No edema.  Skin:    General: Skin is warm and dry.     Capillary Refill: Capillary refill takes less than 2 seconds.  Neurological:     Mental Status: He is alert and oriented to person, place, and time.  Psychiatric:        Mood and Affect: Mood normal.   Cranial nerves II through XII grossly intact.  No pronator drift.  No finger dysmetria.  Patient has full and symmetric strength on his bilateral upper and lower extremities.  Sensation is intact to light touch all extremities.  Patient is oriented x3.  ____________________________________________   LABS (all labs  ordered are listed, but only abnormal results are displayed)  Labs Reviewed  URINALYSIS, COMPLETE (UACMP) WITH MICROSCOPIC - Abnormal; Notable for the following components:      Result Value   Color, Urine YELLOW (*)    APPearance HAZY (*)    Hgb  urine dipstick LARGE (*)    All other components within normal limits  COMPREHENSIVE METABOLIC PANEL - Abnormal; Notable for the following components:   Sodium 131 (*)    Glucose, Bld 113 (*)    All other components within normal limits  CBC WITH DIFFERENTIAL/PLATELET - Abnormal; Notable for the following components:   RBC 3.77 (*)    Hemoglobin 12.0 (*)    HCT 34.6 (*)    All other components within normal limits  RESPIRATORY PANEL BY RT PCR (FLU A&B, COVID)  HEMOGLOBIN A1C  LIPID PANEL  TSH   ____________________________________________  EKG  Sinus rhythm with a ventricular rate of 68, normal axis, unremarkable intervals, and no evidence of acute ischemia or other significant underlying arrhythmia. ____________________________________________  RADIOLOGY  ED MD interpretation: No evidence of CVA, intracranial hemorrhage, or other acute intracranial process.  Official radiology report(s): CT Head Wo Contrast  Result Date: 11/08/2019 CLINICAL DATA:  Altered speech and confusion which lasted 20 minutes EXAM: CT HEAD WITHOUT CONTRAST TECHNIQUE: Contiguous axial images were obtained from the base of the skull through the vertex without intravenous contrast. COMPARISON:  CT 12/28/2013 FINDINGS: Brain: No evidence of acute infarction, hemorrhage, hydrocephalus, extra-axial collection, visible mass lesion or mass effect. Symmetric prominence of the ventricles, cisterns and sulci compatible with parenchymal volume loss. Patchy areas of white matter hypoattenuation are most compatible with chronic microvascular angiopathy. Vascular: Atherosclerotic calcification of the carotid siphons. No hyperdense vessel. Skull: No calvarial fracture or suspicious osseous lesion. No scalp swelling or hematoma. Sinuses/Orbits: Paranasal sinuses and mastoid air cells are predominantly clear. Included orbital structures are unremarkable. Other: Debris in the left external auditory canal. IMPRESSION: 1. No acute  intracranial findings. If there is persisting clinical concern for infarct, MRI is more sensitive and specific for early changes of ischemia. 2. Chronic microvascular angiopathy and parenchymal volume loss. 3. Debris in the left external auditory canal, correlate for cerumen impaction. Electronically Signed   By: Lovena Le M.D.   On: 11/08/2019 15:08    ____________________________________________   PROCEDURES  Procedure(s) performed (including Critical Care):  .1-3 Lead EKG Interpretation Performed by: Lucrezia Starch, MD Authorized by: Lucrezia Starch, MD     Interpretation: normal     ECG rate assessment: normal     Rhythm: sinus rhythm     Ectopy: none     Conduction: normal       ____________________________________________   INITIAL IMPRESSION / ASSESSMENT AND PLAN / ED COURSE        Patient presents with left to history exam for assessment of headache associated with slurred speech and some pain in his right shoulder and subjective foggy sensation.  Arrival patient is hypertensive with a BP of 177/87 otherwise stable vital signs on room air.  Exam as above remarkable for nonfocal neuro exam without any evidence of slurred speech or other neurological abnormalities.  Differential includes but is not limited to TIA, intracranial emergency, hypertensive emergency, metabolic derangements, and complex migraine.  No history of trauma is low suspicion for toxic ingestion or acute infectious process.  No focal deficits to suggest an acute CVA.  CT head shows no evidence of intracranial hemorrhage.  CMP remarkable for mild  hyponatremia with a NA of 131 otherwise no significant electrolyte or metabolic derangements.  CBC is unremarkable.  UA obtained shows some hemoglobin which is abnormal but no evidence of infection.  This will need to be followed up on a nonemergent basis.  Always possible patient is having hypertensive urgency at this time he has no symptoms and so I do not  suspect he is having any neurological deficits related to high blood pressure.  I did order labetalol as well as Compazine and Tylenol.  I will plan to admit to medicine service for full TIA work-up.  ____________________________________________   FINAL CLINICAL IMPRESSION(S) / ED DIAGNOSES  Final diagnoses:  Slurred speech  Nonintractable headache, unspecified chronicity pattern, unspecified headache type  Hypertension, unspecified type  Hemoglobinuria    Medications  labetalol (NORMODYNE) injection 10 mg (has no administration in time range)  acetaminophen (TYLENOL) tablet 1,000 mg (has no administration in time range)  prochlorperazine (COMPAZINE) injection 10 mg (has no administration in time range)     ED Discharge Orders    None       Note:  This document was prepared using Dragon voice recognition software and may include unintentional dictation errors.   Lucrezia Starch, MD 11/08/19 [phone]

## 2019-11-08 NOTE — ED Notes (Signed)
Pt to MRI

## 2019-11-08 NOTE — ED Triage Notes (Signed)
Pt states he started having a foggy headache and some dizziness and then noticed slurred speech around 1315- pt symptoms have resolved now- pt states he still feels a little foggy

## 2019-11-09 ENCOUNTER — Observation Stay (HOSPITAL_BASED_OUTPATIENT_CLINIC_OR_DEPARTMENT_OTHER)
Admit: 2019-11-09 | Discharge: 2019-11-09 | Disposition: A | Discharge status: Home / Self Care | Payer: Medicare Other | Attending: Internal Medicine | Admitting: Internal Medicine

## 2019-11-09 DIAGNOSIS — G459 Transient cerebral ischemic attack, unspecified: Secondary | ICD-10-CM

## 2019-11-09 LAB — CBC
HCT: 35.3 % — ABNORMAL LOW (ref 39.0–52.0)
Hemoglobin: 12.1 g/dL — ABNORMAL LOW (ref 13.0–17.0)
MCH: 31.8 pg (ref 26.0–34.0)
MCHC: 34.3 g/dL (ref 30.0–36.0)
MCV: 92.7 fL (ref 80.0–100.0)
Platelets: 253 10*3/uL (ref 150–400)
RBC: 3.81 MIL/uL — ABNORMAL LOW (ref 4.22–5.81)
RDW: 13.9 % (ref 11.5–15.5)
WBC: 4.5 10*3/uL (ref 4.0–10.5)
nRBC: 0 % (ref 0.0–0.2)

## 2019-11-09 LAB — BASIC METABOLIC PANEL
Anion gap: 9 (ref 5–15)
BUN: 18 mg/dL (ref 8–23)
CO2: 24 mmol/L (ref 22–32)
Calcium: 9.3 mg/dL (ref 8.9–10.3)
Chloride: 100 mmol/L (ref 98–111)
Creatinine, Ser: 0.78 mg/dL (ref 0.61–1.24)
GFR, Estimated: >60 mL/min (ref >60)
Glucose, Bld: 114 mg/dL — ABNORMAL HIGH (ref 70–99)
Potassium: 4.1 mmol/L (ref 3.5–5.1)
Sodium: 133 mmol/L — ABNORMAL LOW (ref 135–145)

## 2019-11-09 LAB — ECHOCARDIOGRAM COMPLETE BUBBLE STUDY
AR max vel: 1.35 cm2
AV Area VTI: 1.7 cm2
AV Area mean vel: 1.63 cm2
AV Mean grad: 9 mmHg
AV Peak grad: 25 mmHg
Ao pk vel: 2.5 m/s
Area-P 1/2: 3.72 cm2
S' Lateral: 2.57 cm

## 2019-11-09 LAB — MAGNESIUM: Magnesium: 2.3 mg/dL (ref 1.7–2.4)

## 2019-11-09 LAB — HEMOGLOBIN A1C
Hgb A1c MFr Bld: 5.7 % — ABNORMAL HIGH (ref 4.8–5.6)
Hgb A1c MFr Bld: 5.8 % — ABNORMAL HIGH (ref 4.8–5.6)
Mean Plasma Glucose: 117 mg/dL
Mean Plasma Glucose: 120 mg/dL

## 2019-11-09 MED ORDER — LOSARTAN POTASSIUM 50 MG PO TABS: 25.0000 mg | ORAL_TABLET | Freq: Every day | ORAL | Status: DC

## 2019-11-09 NOTE — Evaluation (Signed)
Occupational Therapy Evaluation Patient Details Name: Brett Mcguire MRN: [id number] DOB: Aug 09, 1945 Today's Date: 11/09/2019    History of Present Illness Brett Mcguire is a 74 y.o. male with medical history significant for severe aortic stenosis status post TAVR in September 2021, hypertension, hyperlipidemia, chronic hyponatremia. Presented to [facility] ED d/t slurred speech. Pt with suspected TIA and MRI negative.   Clinical Impression   Pt seen for OT evaluation this date after presenting to hospuial d/t slurred speech. Pt feels his symptoms have subsided at this time. Pt reports INDEP at baseline. On assessment, he is able to perform all self care and ADL mobility with no assist and good safety awareness. OT assesses strength, vision, and coordination and pt is [facility] for all and reports he feels this is his baseline. OT educates pt re: role of OT. Pt with good understanding and is in agreement that he likely does not require OT f/u in acute setting or upon d/c. Will complete order at this time.     Follow Up Recommendations  No OT follow up    Equipment Recommendations  None recommended by OT    Recommendations for Other Services       Precautions / Restrictions Precautions Precautions: None Restrictions Weight Bearing Restrictions: No      Mobility Bed Mobility Overal bed mobility: Independent                  Transfers Overall transfer level: Independent                    Balance Overall balance assessment: Independent                                         ADL either performed or assessed with clinical judgement   ADL Overall ADL's : Independent                                       General ADL Comments: Pt demos performing all assessed ADLs at his reported baseline of INDEP     Vision Baseline Vision/History: Wears glasses Patient Visual Report: No change from baseline       Perception     Praxis       Pertinent Vitals/Pain Pain Assessment: No/denies pain     Hand Dominance     Extremity/Trunk Assessment Upper Extremity Assessment Upper Extremity Assessment: Overall WFL for tasks assessed (sensation appropriate, no presence of dysdiadochokinesia, apppriate and equal strength.)   Lower Extremity Assessment Lower Extremity Assessment: Overall WFL for tasks assessed       Communication Communication Communication: No difficulties   Cognition Arousal/Alertness: Awake/alert Behavior During Therapy: WFL for tasks assessed/performed Overall Cognitive Status: Within Functional Limits for tasks assessed                                     General Comments       Exercises Other Exercises Other Exercises: OT faciltiates ed re: role of OT, pt with good understanding.   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 INDEP with all I/ADLs and ADL mobility with no AD.        OT Problem List: Decreased activity tolerance      OT Treatment/Interventions: Self-care/ADL training;Therapeutic activities    OT Goals(Current goals can be found in the care plan section) Acute Rehab OT Goals Patient Stated Goal: to go home OT Goal Formulation: All assessment and education complete, DC therapy  OT Frequency:     Barriers to D/C:            Co-evaluation              AM-PAC OT "6 Clicks" Daily Activity     Outcome  Measure Help from another person eating meals?: None Help from another person taking care of personal grooming?: None Help from another person toileting, which includes using toliet, bedpan, or urinal?: None Help from another person bathing (including washing, rinsing, drying)?: None Help from another person to put on and taking off regular upper body clothing?: None Help from another person to put on and taking off regular lower body clothing?: None 6 Click Score: 24   End of Session Equipment Utilized During Treatment: Gait belt Nurse Communication: Mobility status  Activity Tolerance: Patient tolerated treatment well Patient left: in chair;with call bell/phone within reach  OT Visit Diagnosis: Muscle weakness (generalized) (M62.81)                Time: 6063-0160 OT Time Calculation (min): 23 min Charges:  OT General Charges $OT Visit: 1 Visit OT Evaluation $OT Eval Low Complexity: 1 Low OT Treatments $Self Care/Home Management : 8-22 mins  Gerrianne Scale, [place], OTR/L ascom [phone] 11/09/19, 3:17 PM

## 2019-11-09 NOTE — Discharge Summary (Signed)
Physician Discharge Summary   Brett Mcguire  male DOB: November 09, 1945  OIN:[id number]  PCP: Tonia Ghent, MD  Admit date: 11/08/2019 Discharge date: 11/09/2019  Admitted From: home Disposition:  home CODE STATUS: Full code   Hospital Course:  For full details, please see H&P, progress notes, consult notes and ancillary notes.  Briefly,  Brett Mcguire is a 74 y.o. male with medical history significant for severe aortic stenosis status post TAVR in September 2021, hypertension, hyperlipidemia, chronic hyponatremia with baseline serum sodium range of 129-135, who presented with complaint of slurred speech.   #) TIA Acute onset of slurred speech at 1330 on the afternoon of 11/08/2019, with spontaneous complete resolution of the symptoms within 20 minutes without subsequent recurrence or development of additional acute focal neurologic deficit.  Presenting CT head unremarkable.  MRI brain showed no stroke.  MRA head wnl.  US carotid no significant stenosis.  Echo showed no shunt.  Patient is currently on dual antiplatelet therapy via aspirin 81 mg PO qday and Plavix.  Pt was discharged on home ASA, plavix and statin.  #) Severe aortic stenosis: Status post TAVR in 06/2019.   Continued dual antiplatelet per instruction of outpatient cardiology documentation.  #) Essential hypertension:  On losartan as his sole outpatient antihypertensive medication.  Presenting systolic blood pressure noted to be 177.  Home losartan held for permissive HTN and resumed at discharge.  #) Hyperlipidemia Continued home statin  #) Chronic hyponatremia:  Dating back to May 2018 the patient's baseline serum sodium range appears to be 129-135.  Presenting serum sodium found to be within this range at 131, stable.  No intervention necessary.    Discharge Diagnoses:  Principal Problem:   TIA (transient ischemic attack) Active Problems:   Hyperlipidemia   Essential hypertension   Severe aortic  stenosis   Slurred speech   Chronic hyponatremia    Discharge Instructions:  Allergies as of 11/09/2019      Reactions   Cialis [tadalafil] Other (See Comments)   heartburn   Ephedrine Other (See Comments)   BP spikes   Finasteride Diarrhea   diarrhea   Sulfonamide Derivatives Other (See Comments)   REACTION: as child   Alfuzosin Rash, Other (See Comments)   Skin rash, burning sensation of skin   Doxazosin Rash, Other (See Comments)   Skin rash, burning sensation of skin   Flomax [tamsulosin Hcl] Rash   Rash   Rapaflo [silodosin] Rash   rash      Medication List    TAKE these medications   amoxicillin 500 MG capsule Commonly known as: AMOXIL Take 2,000 mg by mouth as directed. Take 2,000 mg one hour prior to all dental visits.   aspirin 81 MG chewable tablet Chew 1 tablet (81 mg total) by mouth daily.   atorvastatin 10 MG tablet Commonly known as: LIPITOR Take 1 tablet (10 mg total) by mouth daily.   clopidogrel 75 MG tablet Commonly known as: PLAVIX Take 1 tablet (75 mg total) by mouth daily with breakfast.   eucerin cream Apply 1 application topically daily as needed for dry skin.   folic acid 1 MG tablet Commonly known as: FOLVITE Take 1 mg by mouth See admin instructions. Take 1 tablet (1 mg) by mouth on 6 days in the week, do NOT take on Mondays.   losartan 25 MG tablet Commonly known as: COZAAR Take 1 tablet (25 mg total) by mouth daily.   methotrexate 2.5 MG tablet Take 12.5 mg  by mouth every Monday.   Systane Ultra 0.4-0.3 % Soln Generic drug: Polyethyl Glycol-Propyl Glycol Place 1 drop into both eyes daily as needed (dry/irritated eyes.).        Follow-up Information    Tonia Ghent, MD. Schedule an appointment as soon as possible for a visit in 1 week(s).   Specialty: Family Medicine Contact information: [place] [address] [phone]        Minna Merritts, MD .   Specialty: Cardiology Contact  information: [address] [place] [address] [phone]               Allergies  Allergen Reactions  . Cialis [Tadalafil] Other (See Comments)    heartburn  . Ephedrine Other (See Comments)    BP spikes   . Finasteride Diarrhea    diarrhea  . Sulfonamide Derivatives Other (See Comments)    REACTION: as child  . Alfuzosin Rash and Other (See Comments)    Skin rash, burning sensation of skin  . Doxazosin Rash and Other (See Comments)    Skin rash, burning sensation of skin  . Flomax [Tamsulosin Hcl] Rash    Rash  . Rapaflo [Silodosin] Rash    rash     The results of significant diagnostics from this hospitalization (including imaging, microbiology, ancillary and laboratory) are listed below for reference.   Consultations:   Procedures/Studies: DG Shoulder Right  Result Date: 11/08/2019 CLINICAL DATA:  Shoulder pain EXAM: RIGHT SHOULDER - 2+ VIEW COMPARISON:  None. FINDINGS: Glenohumeral joint is intact. No evidence of scapular fracture or humeral fracture. The acromioclavicular joint is intact. IMPRESSION: No fracture or dislocation. Electronically Signed   By: Suzy Bouchard M.D.   On: 11/08/2019 18:54   CT Head Wo Contrast  Result Date: 11/08/2019 CLINICAL DATA:  Altered speech and confusion which lasted 20 minutes EXAM: CT HEAD WITHOUT CONTRAST TECHNIQUE: Contiguous axial images were obtained from the base of the skull through the vertex without intravenous contrast. COMPARISON:  CT 12/28/2013 FINDINGS: Brain: No evidence of acute infarction, hemorrhage, hydrocephalus, extra-axial collection, visible mass lesion or mass effect. Symmetric prominence of the ventricles, cisterns and sulci compatible with parenchymal volume loss. Patchy areas of white matter hypoattenuation are most compatible with chronic microvascular angiopathy. Vascular: Atherosclerotic calcification of the carotid siphons. No hyperdense vessel. Skull: No calvarial fracture or  suspicious osseous lesion. No scalp swelling or hematoma. Sinuses/Orbits: Paranasal sinuses and mastoid air cells are predominantly clear. Included orbital structures are unremarkable. Other: Debris in the left external auditory canal. IMPRESSION: 1. No acute intracranial findings. If there is persisting clinical concern for infarct, MRI is more sensitive and specific for early changes of ischemia. 2. Chronic microvascular angiopathy and parenchymal volume loss. 3. Debris in the left external auditory canal, correlate for cerumen impaction. Electronically Signed   By: Lovena Le M.D.   On: 11/08/2019 15:08   MR ANGIO HEAD WO CONTRAST  Result Date: 11/08/2019 CLINICAL DATA:  Transient ischemic attack EXAM: MRI HEAD WITHOUT CONTRAST MRA HEAD WITHOUT CONTRAST TECHNIQUE: Multiplanar, multiecho pulse sequences of the brain and surrounding structures were obtained without intravenous contrast. Angiographic images of the head were obtained using MRA technique without contrast. COMPARISON:  Head CT 11/08/2019 FINDINGS: MRI HEAD FINDINGS Brain: No acute infarct, acute hemorrhage or extra-axial collection. Multifocal hyperintense T2-weighted signal within the white matter. Normal volume of CSF spaces. No chronic microhemorrhage. A partially empty sella is incidentally noted. Vascular: Normal flow voids. Skull  and upper cervical spine: Normal marrow signal. Sinuses/Orbits: Negative. Other: None. MRA HEAD FINDINGS POSTERIOR CIRCULATION: --Vertebral arteries: Normal --Inferior cerebellar arteries: Normal. --Basilar artery: Normal. --Superior cerebellar arteries: Normal. --Posterior cerebral arteries: Normal. ANTERIOR CIRCULATION: --Intracranial internal carotid arteries: Normal. --Anterior cerebral arteries (ACA): Normal. --Middle cerebral arteries (MCA): Normal. ANATOMIC VARIANTS: None IMPRESSION: 1. No acute intracranial abnormality. 2. Normal intracranial MRA. 3. Mild chronic small vessel ischemic changes.  Electronically Signed   By: Ulyses Jarred M.D.   On: 11/08/2019 21:26   MR BRAIN WO CONTRAST  Result Date: 11/08/2019 CLINICAL DATA:  Transient ischemic attack EXAM: MRI HEAD WITHOUT CONTRAST MRA HEAD WITHOUT CONTRAST TECHNIQUE: Multiplanar, multiecho pulse sequences of the brain and surrounding structures were obtained without intravenous contrast. Angiographic images of the head were obtained using MRA technique without contrast. COMPARISON:  Head CT 11/08/2019 FINDINGS: MRI HEAD FINDINGS Brain: No acute infarct, acute hemorrhage or extra-axial collection. Multifocal hyperintense T2-weighted signal within the white matter. Normal volume of CSF spaces. No chronic microhemorrhage. A partially empty sella is incidentally noted. Vascular: Normal flow voids. Skull and upper cervical spine: Normal marrow signal. Sinuses/Orbits: Negative. Other: None. MRA HEAD FINDINGS POSTERIOR CIRCULATION: --Vertebral arteries: Normal --Inferior cerebellar arteries: Normal. --Basilar artery: Normal. --Superior cerebellar arteries: Normal. --Posterior cerebral arteries: Normal. ANTERIOR CIRCULATION: --Intracranial internal carotid arteries: Normal. --Anterior cerebral arteries (ACA): Normal. --Middle cerebral arteries (MCA): Normal. ANATOMIC VARIANTS: None IMPRESSION: 1. No acute intracranial abnormality. 2. Normal intracranial MRA. 3. Mild chronic small vessel ischemic changes. Electronically Signed   By: Ulyses Jarred M.D.   On: 11/08/2019 21:26   US Carotid Duplex Bilateral  Result Date: 11/09/2019 CLINICAL DATA:  74 year old male with history of TIA EXAM: BILATERAL CAROTID DUPLEX ULTRASOUND TECHNIQUE: Pearline Cables scale imaging, color Doppler and duplex ultrasound were performed of bilateral carotid and vertebral arteries in the neck. COMPARISON:  None. FINDINGS: Criteria: Quantification of carotid stenosis is based on velocity parameters that correlate the residual internal carotid diameter with NASCET-based stenosis levels,  using the diameter of the distal internal carotid lumen as the denominator for stenosis measurement. The following velocity measurements were obtained: RIGHT ICA: Peak systolic velocity 51 cm/sec, End diastolic velocity 14 cm/sec CCA: Peak systolic velocity 67 cm/sec SYSTOLIC ICA/CCA RATIO:  0.8 ECA: Peak systolic velocity 992 cm/sec LEFT ICA: Peak systolic velocity 77 cm/sec, End diastolic velocity 22 cm/sec CCA: 58 cm/sec SYSTOLIC ICA/CCA RATIO:  1.3 ECA: 85 cm/sec RIGHT CAROTID ARTERY: Mild multifocal atherosclerotic plaque formation at the carotid bifurcation. No significant tortuosity. Normal low resistance waveforms. RIGHT VERTEBRAL ARTERY:  Antegrade flow. LEFT CAROTID ARTERY: Mild multifocal atherosclerotic plaque formation at the carotid bifurcation and bulb. No significant tortuosity. Normal low resistance waveforms. LEFT VERTEBRAL ARTERY:  Antegrade flow. Upper extremity non-invasive blood pressures: Not obtained. IMPRESSION: 1. Right carotid artery system: Less than 50% stenosis secondary to mild atherosclerotic plaque formation. 2. Left carotid artery system: Less than 50% stenosis secondary to mild atherosclerotic plaque formation. 3.  Vertebral artery system: Patent with antegrade flow bilaterally. Ruthann Cancer, MD [facility] Electronically Signed   By: Ruthann Cancer MD   On: 11/09/2019 08:09      Labs: BNP (last 3 results) Recent Labs    09/28/19 1042  BNP 42.6   Basic Metabolic Panel: Recent Labs  Lab 11/08/19 1408 11/09/19 0259  NA 131* 133*  K 4.5 4.1  CL 99 100  CO2 24 24  GLUCOSE 113* 114*  BUN 21 18  CREATININE 1.01 0.78  CALCIUM 9.2 9.3  MG  --  2.3   Liver Function Tests: Recent Labs  Lab 11/08/19 1408  AST 22  ALT 19  ALKPHOS 61  BILITOT 0.7  PROT 7.0  ALBUMIN 4.0   No results for input(s): LIPASE, AMYLASE in the last 168 hours. No results for input(s): AMMONIA in the last 168  hours. CBC: Recent Labs  Lab 11/08/19 1408 11/09/19 0259  WBC 4.3 4.5  NEUTROABS 2.6  --   HGB 12.0* 12.1*  HCT 34.6* 35.3*  MCV 91.8 92.7  PLT 253 253   Cardiac Enzymes: No results for input(s): CKTOTAL, CKMB, CKMBINDEX, TROPONINI in the last 168 hours. BNP: Invalid input(s): POCBNP CBG: No results for input(s): GLUCAP in the last 168 hours. D-Dimer No results for input(s): DDIMER in the last 72 hours. Hgb A1c No results for input(s): HGBA1C in the last 72 hours. Lipid Profile Recent Labs    11/08/19 1757  CHOL 152  HDL 48  LDLCALC NOT CALCULATED  TRIG 117  CHOLHDL 3.2   Thyroid function studies Recent Labs    11/08/19 1757  TSH 1.626   Anemia work up No results for input(s): VITAMINB12, FOLATE, FERRITIN, TIBC, IRON, RETICCTPCT in the last 72 hours. Urinalysis    Component Value Date/Time   COLORURINE YELLOW (A) 11/08/2019 1408   APPEARANCEUR HAZY (A) 11/08/2019 1408   APPEARANCEUR Hazy 03/10/2013 0959   LABSPEC 1.017 11/08/2019 1408   LABSPEC 1.016 03/10/2013 0959   PHURINE 5.0 11/08/2019 1408   GLUCOSEU NEGATIVE 11/08/2019 1408   GLUCOSEU Negative 03/10/2013 0959   HGBUR LARGE (A) 11/08/2019 1408   BILIRUBINUR NEGATIVE 11/08/2019 1408   BILIRUBINUR Negative 03/10/2013 0959   KETONESUR NEGATIVE 11/08/2019 1408   PROTEINUR NEGATIVE 11/08/2019 1408   NITRITE NEGATIVE 11/08/2019 1408   LEUKOCYTESUR NEGATIVE 11/08/2019 1408   LEUKOCYTESUR 3+ 03/10/2013 0959   Sepsis Labs Invalid input(s): PROCALCITONIN,  WBC,  LACTICIDVEN Microbiology Recent Results (from the past 240 hour(s))  Respiratory Panel by RT PCR (Flu A&B, Covid) - Nasopharyngeal Swab     Status: None   Collection Time: 11/08/19  6:24 PM   Specimen: Nasopharyngeal Swab  Result Value Ref Range Status   SARS Coronavirus 2 by RT PCR NEGATIVE NEGATIVE Final    Comment: (NOTE) SARS-CoV-2 target nucleic acids are NOT DETECTED.  The SARS-CoV-2 RNA is generally detectable in upper  respiratoy specimens during the acute phase of infection. The lowest concentration of SARS-CoV-2 viral copies this assay can detect is 131 copies/mL. A negative result does not preclude SARS-Cov-2 infection and should not be used as the sole basis for treatment or other patient management decisions. A negative result may occur with  improper specimen collection/handling, submission of specimen other than nasopharyngeal swab, presence of viral mutation(s) within the areas targeted by this assay, and inadequate number of viral copies (<131 copies/mL). A negative result must be combined with clinical observations, patient history, and epidemiological information. The expected result is Negative.  Fact Sheet for Patients:  PinkCheek.be  Fact Sheet for Healthcare Providers:  GravelBags.it  This test is no t yet approved or cleared by the [place] FDA and  has been authorized for detection and/or diagnosis of SARS-CoV-2 by FDA under an Emergency Use Authorization (EUA). This EUA will remain  in effect (meaning this test can be used) for the duration of the COVID-19 declaration under Section 564(b)(1) of the Act, 21 U.S.C. section 360bbb-3(b)(1), unless the authorization is terminated or revoked sooner.     Influenza  A by PCR NEGATIVE NEGATIVE Final   Influenza B by PCR NEGATIVE NEGATIVE Final    Comment: (NOTE) The Xpert Xpress SARS-CoV-2/FLU/RSV assay is intended as an aid in  the diagnosis of influenza from Nasopharyngeal swab specimens and  should not be used as a sole basis for treatment. Nasal washings and  aspirates are unacceptable for Xpert Xpress SARS-CoV-2/FLU/RSV  testing.  Fact Sheet for Patients: PinkCheek.be  Fact Sheet for Healthcare Providers: GravelBags.it  This test is not yet approved or cleared by the [place] FDA and  has been  authorized for detection and/or diagnosis of SARS-CoV-2 by  FDA under an Emergency Use Authorization (EUA). This EUA will remain  in effect (meaning this test can be used) for the duration of the  Covid-19 declaration under Section 564(b)(1) of the Act, 21  U.S.C. section 360bbb-3(b)(1), unless the authorization is  terminated or revoked. Performed at [facility], [address] [place], [place] [address]      Total time spend on discharging this patient, including the last patient exam, discussing the hospital stay, instructions for ongoing care as it relates to all pertinent caregivers, as well as preparing the medical discharge records, prescriptions, and/or referrals as applicable, is 45 minutes.    Enzo Bi, MD  [facility] 11/09/2019, 1:10 PM  If 7PM-7AM, please contact night-coverage

## 2019-11-09 NOTE — Progress Notes (Signed)
AVS printed and gone over with patient, all questions answered. Pt ambulated out to ED lobby with no assistance

## 2019-11-09 NOTE — Progress Notes (Signed)
   11/08/19 0300  Clinical Encounter Type  Visited With Patient and family together  Visit Type Initial;Spiritual support;Social support  Referral From Nurse  Consult/Referral To Chaplain  Visited with Pt and family per OR in the ED. Asked how everything was going. They said, they were doing fine. I let them know if they need me to let the nurse know and I will come back.

## 2019-11-09 NOTE — Progress Notes (Signed)
*  PRELIMINARY RESULTS* Echocardiogram 2D Echocardiogram has been performed.  Brett Mcguire 11/09/2019, 11:34 AM

## 2019-11-09 NOTE — Progress Notes (Signed)
PT Cancellation Note  Patient Details Name: Brett Mcguire MRN: [id number] DOB: 30-Mar-1945   Cancelled Treatment:    Reason Eval/Treat Not Completed: PT screened, no needs identified, will sign off (Consult received and chart reviewed.  Per patient, symptoms fully resolved and now at baseline (indep) level of functional ability; declines need for formal PT evaluation.  Verified with OT (visualized/assessed mobility during evaluation).  Will complete initial order at this time; please re-consult as medically appropriate should needs change.)   Shritha Bresee H. Owens Shark, PT, DPT, NCS 11/09/19, 2:03 PM [phone]

## 2019-11-14 DIAGNOSIS — Z79899 Other long term (current) drug therapy: Secondary | ICD-10-CM | POA: Diagnosis not present

## 2019-11-15 ENCOUNTER — Encounter: Payer: Self-pay | Admitting: Physician Assistant

## 2019-11-15 ENCOUNTER — Ambulatory Visit (INDEPENDENT_AMBULATORY_CARE_PROVIDER_SITE_OTHER): Payer: Medicare Other | Admitting: Physician Assistant

## 2019-11-15 ENCOUNTER — Ambulatory Visit: Payer: Medicare Other | Admitting: Physician Assistant

## 2019-11-15 ENCOUNTER — Other Ambulatory Visit (HOSPITAL_COMMUNITY): Payer: Medicare Other

## 2019-11-15 ENCOUNTER — Other Ambulatory Visit: Payer: Self-pay

## 2019-11-15 VITALS — BP 146/80 | HR 71 | Ht 69.0 in | Wt 167.0 lb

## 2019-11-15 DIAGNOSIS — Z952 Presence of prosthetic heart valve: Secondary | ICD-10-CM | POA: Diagnosis not present

## 2019-11-15 DIAGNOSIS — I779 Disorder of arteries and arterioles, unspecified: Secondary | ICD-10-CM | POA: Diagnosis not present

## 2019-11-15 DIAGNOSIS — I10 Essential (primary) hypertension: Secondary | ICD-10-CM

## 2019-11-15 DIAGNOSIS — R911 Solitary pulmonary nodule: Secondary | ICD-10-CM | POA: Diagnosis not present

## 2019-11-15 MED ORDER — ATORVASTATIN CALCIUM 40 MG PO TABS: 40.0000 mg | ORAL_TABLET | Freq: Every day | ORAL | 3 refills | Status: DC

## 2019-11-15 MED ORDER — LOSARTAN POTASSIUM 50 MG PO TABS: 50.0000 mg | ORAL_TABLET | Freq: Every day | ORAL | 3 refills | Status: DC

## 2019-11-15 NOTE — Patient Instructions (Signed)
Medication Instructions:  1) INCREASE LIPITOR to 40 mg daily 2) INCREASE LOSARTAN to 50 mg daily *If you need a refill on your cardiac medications before your next appointment, please call your pharmacy*  Lab Work: Please proceed to [facility] in 2 weeks for repeat blood work. You do not need to be fasting. If you have labs (blood work) drawn today and your tests are completely normal, you will receive your results only by: Marland Kitchen MyChart Message (if you have MyChart) OR . A paper copy in the mail If you have any lab test that is abnormal or we need to change your treatment, we will call you to review the results.  Follow-Up: At [facility], you and your health needs are our priority.  As part of our continuing mission to provide you with exceptional heart care, we have created designated Provider Care Teams.  These Care Teams include your primary Cardiologist (physician) and Advanced Practice Providers (APPs -  Physician Assistants and Nurse Practitioners) who all work together to provide you with the care you need, when you need it. Your next appointment:   4 month(s) The format for your next appointment:   In Person Provider:   Ida Rogue, MD

## 2019-11-15 NOTE — Progress Notes (Signed)
[facility] [place]                                       Cardiology Office Note    Date:  11/15/2019   ID:  Brett Mcguire, DOB February 01, 1945, MRN [id number]  PCP:  Brett Ghent, MD  Cardiologist: Brett Rogue, MD / Dr. Burt Mcguire & Dr. Roxy Mcguire (TAVR)  CC: 1 month s/p TAVR  History of Present Illness:  Brett Mcguire is a 74 y.o. 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   Patient states that he was first discovered to have a heart murmur on physical exam approximately 10 years ago. He was initially followed by his primary care physician and later referred to Brett Mcguire who has been following him regularly ever since. Over the last several years the patient has experienced a gradual decline in his exercise tolerance with progressive exertional shortness of breath and increased fatigue. Recent follow-up transthoracic echocardiogram performed August 22, 2019 revealed significant progression of disease and severe aortic stenosis. Peak velocity across the aortic valve measured as high as 4.33m/s corresponding to mean transvalvular gradient estimated 69mmHg.Left ventricular systolic function appeared normal with ejection fraction estimated 60 to 65%. Diagnostic cardiac catheterization was performed by Brett Mcguire September 06, 2019 and revealed normal coronary artery anatomy with no significant coronary artery disease. Peak to peak and mean transvalvular gradients across the aortic valve were reported 66 and 41 mmHg respectively. Pulmonary artery pressures were normal.   He was evaluated by the multidisciplinary valve team and underwent successful TAVR with a 26 mm Edwards Sapien 3 THV via the TF approach on 10/01/20. Post operative echo showed EF 70%, normally functioning  TAVR with a mean gradient of 16 mm hg and no PVL. He was discharged on aspirin and plavix.   He was admitted 10/28-10/29/21 for TIA. Echo was done during this admission which showed EF 60%, normally functioning TAVR with a mean gradient of 9 mmHg and no PVL. Bubble study was also performed which was negative.   Today he presents to clinic for follow up. Here with his wife. No complaints. He is a little disappointed he doesn't feel better but does still see some improvement. No CP. No LE edema, orthopnea or PND. No dizziness or syncope. No blood in stool or urine. No palpitations. No new neurologic symptoms. Has been spooked by recent TIA     Past Medical History:  Diagnosis Date  . Allergy 1989  . 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 BPH (benign prostatic hyperplasia)   . Carotid artery disease ([place])   . Diverticulosis 02/2000  . Hyperlipidemia 1989  . Hypertension   . Incidental lung nodule, less than or equal to 85mm 09/26/2019  . Lung cancer ([place]) 1989   s/p lobectomy, no chemo or rady  . Melanoma of skin ([place])    chest  . Plaque psoriasis   . 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 Burt Mcguire and Dr. Roxy Mcguire  . Severe aortic stenosis     Past Surgical History:  Procedure Laterality Date  .  COLONOSCOPY  02/18/2000   Polyp, diverticulosis, repeat in 5 years  . COLONOSCOPY  03/03/2006   Repeat  -  Divertics  . COLONOSCOPY WITH PROPOFOL N/A 12/22/2017   Procedure: COLONOSCOPY WITH PROPOFOL;  Surgeon: Brett Manifold, MD;  Location: [facility];  Service: Endoscopy;  Laterality: N/A;  . CYSTOSCOPY WITH INSERTION OF UROLIFT    . Laminectomy/Discectomy  11/03/2005   L5/S1  . Malignant melanoma excision  06/20/2003   Dr. Nehemiah Mcguire  . MRI  10/09/2005   Lumbar spine, bulging disc L5/S1  . Right lobectomy  1989   Lung cancer  . RIGHT/LEFT HEART CATH AND CORONARY ANGIOGRAPHY Bilateral  09/06/2019   Procedure: RIGHT/LEFT HEART CATH AND CORONARY ANGIOGRAPHY;  Surgeon: Brett Merritts, MD;  Location: [facility];  Service: Cardiovascular;  Laterality: Bilateral;  . TEE WITHOUT CARDIOVERSION N/A 10/02/2019   Procedure: TRANSESOPHAGEAL ECHOCARDIOGRAM (TEE);  Surgeon: Brett Mocha, MD;  Location: [facility];  Service: Open Heart Surgery;  Laterality: N/A;  . TONSILLECTOMY AND ADENOIDECTOMY    . TRANSCATHETER AORTIC VALVE REPLACEMENT, TRANSFEMORAL  10/02/2019   Transcatheter Aortic Valve Replacement - Percutaneous Left Transfemoral Approach  . TRANSCATHETER AORTIC VALVE REPLACEMENT, TRANSFEMORAL N/A 10/02/2019   Procedure: TRANSCATHETER AORTIC VALVE REPLACEMENT, TRANSFEMORAL USING EDWARDS SAPIEN 3 ULTRA 26 MM THV.;  Surgeon: Brett Mocha, MD;  Location: [facility];  Service: Open Heart Surgery;  Laterality: N/A;    Current Medications: Outpatient Medications Prior to Visit  Medication Sig Dispense Refill  . amoxicillin (AMOXIL) 500 MG capsule Take 2,000 mg by mouth as directed. Take 2,000 mg one hour prior to all dental visits.    Marland Kitchen aspirin 81 MG chewable tablet Chew 1 tablet (81 mg total) by mouth daily.    . clopidogrel (PLAVIX) 75 MG tablet Take 1 tablet (75 mg total) by mouth daily with breakfast. 90 tablet 1  . folic acid (FOLVITE) 1 MG tablet Take 1 mg by mouth See admin instructions. Take 1 tablet (1 mg) by mouth on 6 days in the week, do NOT take on Mondays.    . methotrexate 2.5 MG tablet Take 10 mg by mouth every Monday.     Brett Mcguire Glycol-Propyl Glycol (SYSTANE ULTRA) 0.4-0.3 % SOLN Place 1 drop into both eyes daily as needed (dry/irritated eyes.).     Marland Kitchen Skin Protectants, Misc. (EUCERIN) cream Apply 1 application topically daily as needed for dry skin.    Marland Kitchen atorvastatin (LIPITOR) 10 MG tablet Take 1 tablet (10 mg total) by mouth daily. 90 tablet 3  . losartan (COZAAR) 25 MG tablet Take 1 tablet (25 mg total) by mouth daily. 90 tablet 3   No facility-administered  medications prior to visit.     Allergies:   Cialis [tadalafil], Ephedrine, Finasteride, Sulfonamide derivatives, Alfuzosin, Doxazosin, Flomax [tamsulosin hcl], and Rapaflo [silodosin]   Social History   Socioeconomic History  . Marital status: Married    Spouse name: Brett Mcguire  . Number of children: 2  . Years of education: Not on file  . Highest education level: Not on file  Occupational History  . 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 Smoking status: Former Smoker    Packs/day: 2.00    Years: 17.00    Pack years: 34.00    Types: Cigarettes    Quit date: 02/22/1982    Years since quitting: 37.7  . Smokeless tobacco: Never Used  Vaping Use  . Vaping Use:  Never used  Substance and Sexual Activity  . Alcohol use: Not Currently    Alcohol/week: 10.0 standard drinks    Types: 10 Glasses of wine per week    Comment: wine  . Drug use: No  . Sexual activity: Yes  Other Topics Concern  . 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    Married 40+ years   Social Determinants of Radio broadcast assistant Strain:   . Difficulty of Paying Living Expenses: Not on file  Food Insecurity:   . Worried About Charity fundraiser in the Last Year: Not on file  . Ran Out of Food in the Last Year: Not on file  Transportation Needs:   . Lack of Transportation (Medical): Not on file  . Lack of Transportation (Non-Medical): Not on file  Physical Activity:   . Days of Exercise per Week: Not on file  . Minutes of Exercise per Session: Not on file  Stress:   . Feeling of Stress : Not on file  Social Connections:   . Frequency of Communication with Friends and Family: Not on file  . Frequency of Social Gatherings with Friends and Family: Not on file  . Attends Religious Services: Not on file  . Active Member of Clubs or Organizations: Not on file  .  Attends Archivist Meetings: Not on file  . Marital Status: Not on file     Family History:  The patient's family history includes Alcohol abuse in his mother; Cancer in his father, mother, and another family member; Prostate cancer in his father.     ROS:   Please see the history of present illness.    ROS All other systems reviewed and are negative.   PHYSICAL EXAM:   VS:  BP (!) 146/80   Pulse 71   Ht 5\' 9"  (1.753 m)   Wt 167 lb (75.8 kg)   SpO2 96%   BMI 24.66 kg/m    GEN: Well nourished, well developed, in no acute distress HEENT: normal Neck: no JVD or masses Cardiac: RRR; soft flow murmur @ RUSB. No rubs, or gallops,no edema  Respiratory:  clear to auscultation bilaterally, normal work of breathing GI: soft, nontender, nondistended, + BS MS: no deformity or atrophy Skin: warm and dry, no rash. Neuro:  Alert and Oriented x 3, Strength and sensation are intact Psych: euthymic mood, full affect   Wt Readings from Last 3 Encounters:  11/15/19 167 lb (75.8 kg)  11/09/19 160 lb 0.9 oz (72.6 kg)  10/10/19 165 lb (74.8 kg)      Studies/Labs Reviewed:   EKG:  EKG is NOT ordered today.    Recent Labs: 09/28/2019: B Natriuretic Peptide 64.8 11/08/2019: ALT 19; TSH 1.626 11/09/2019: BUN 18; Creatinine, Ser 0.78; Hemoglobin 12.1; Magnesium 2.3; Platelets 253; Potassium 4.1; Sodium 133   Lipid Panel    Component Value Date/Time   CHOL 152 11/08/2019 1757   TRIG 117 11/08/2019 1757   HDL 48 11/08/2019 1757   CHOLHDL 3.2 11/08/2019 1757   VLDL 23 11/08/2019 1757   LDLCALC NOT CALCULATED 11/08/2019 1757   LDLDIRECT 120.1 02/07/2006 0955    Additional studies/ records that were reviewed today include:  TAVR OPERATIVE NOTE   Date of Procedure:10/02/2019  Preoperative Diagnosis:Severe Aortic Stenosis   Postoperative Diagnosis:Same   Procedure:   Transcatheter Aortic Valve Replacement -  PercutaneousLeftTransfemoral Approach Edwards Sapien 3 Ultra THV (size 67mm, model # L4387844, serial # [id number])  Co-Surgeons:Clarence H. Brett Manns, MD and Brett Mocha, MD  Anesthesiologist:Charlene Nyoka Cowden, MD  Echocardiographer:Mihai Croitoru, MD  Pre-operative Echo Findings: ? Severe aortic stenosis ? Normalleft ventricular systolic function  Post-operative Echo Findings: ? Trivialparavalvular leak ? Normalleft ventricular systolic function  _____________    Echo 10/03/19:  IMPRESSIONS  1. Left ventricular ejection fraction, by estimation, is 70 to 75%. The left ventricle has hyperdynamic function. The left ventricle has no  regional wall motion abnormalities. Left ventricular diastolic parameters  are consistent with Grade I diastolic  dysfunction (impaired relaxation).  2. Right ventricular systolic function is normal. The right ventricular  size is normal.  3. Left atrial size was mildly dilated.  4. The mitral valve is normal in structure. No evidence of mitral valve  regurgitation. No evidence of mitral stenosis.  5. The aortic valve has been repaired/replaced. Aortic valve  regurgitation is not visualized. No aortic stenosis is present. There is a  Sapien prosthetic (TAVR) valve present in the aortic position. Procedure  Date: 10/02/2019. Aortic valve mean gradient  measures 16.5 mmHg. Aortic valve Vmax measures 2.82 m/s.  6. The inferior vena cava is normal in size with greater than 50%  respiratory variability, suggesting right atrial pressure of 3 mmHg.   _______________  Echo 11/09/19 IMPRESSIONS  1. Left ventricular ejection fraction, by estimation, is 60 to 65%. The  left ventricle has normal function. The left ventricle has no regional  wall motion abnormalities. Left ventricular diastolic parameters are  consistent with Grade I diastolic  dysfunction (impaired  relaxation).  2. Right ventricular systolic function is normal. The right ventricular  size is normal. Tricuspid regurgitation signal is inadequate for assessing  PA pressure.  3. Left atrial size was mildly dilated.  4. The mitral valve is normal in structure. No evidence of mitral valve  regurgitation. No evidence of mitral stenosis.  5. The aortic valve has been repaired/replaced. Aortic valve  regurgitation is not visualized. No aortic stenosis is present. Aortic  valve mean gradient measures 9.0 mmHg. Normal TAVR prosthesis.  6. The inferior vena cava is normal in size with greater than 50%  respiratory variability, suggesting right atrial pressure of 3 mmHg.  7. Agitated saline contrast bubble study was negative, with no evidence of any interatrial shunt.  ASSESSMENT & PLAN:   Severe AS s/p TAVR: echo 10/29 showed EF 60%, normally functioning TAVR with a mean gradient of 9 mmHg and no PVL. He has NYHA class II symptoms with multifactorial dyspnea. SBE prophylaxis discussed; he has amoxicillin. Continue ASA and Plavix. Plavix can be discontinued after 6 months of therapy (03/2020)  HTN: Bp a little elevated today. He woud like to be more aggressive with RF modification given recent TIA. Will increase Losartan from 25mg  to 50mg  daily. BMET in 2 weeks  Carotid disease: pre TAVR dopplers showed 40-59% stenosis bilaterally. Continue aspirin and statin. I started atorva at last visit and he is tolerating it well. Will increase dose from 10mg  to 40mg  daily. Will repeat carotid dopplers at 1 year visit.   Pulmonary nodule: pre TAVR CT showed a tiny pulmonary nodules measuring 3 mm or less in size, stable compared to the prior study from February 2021, strongly favored to be benign. No follow-up needed if patient is low-risk (and has no known or suspected primary neoplasm). Non-contrast chest CT can be considered in 12 months if patient is high-risk. Pt is high-risk  with hx of lung CA and  this will be arranged for 12 months.    Medication Adjustments/Labs and Tests Ordered: Current medicines are reviewed at length with the patient today.  Concerns regarding medicines are outlined above.  Medication changes, Labs and Tests ordered today are listed in the Patient Instructions below. Patient Instructions  Medication Instructions:  1) INCREASE LIPITOR to 40 mg daily 2) INCREASE LOSARTAN to 50 mg daily *If you need a refill on your cardiac medications before your next appointment, please call your pharmacy*  Lab Work: Please proceed to [facility] in 2 weeks for repeat blood work. You do not need to be fasting. If you have labs (blood work) drawn today and your tests are completely normal, you will receive your results only by: Marland Kitchen MyChart Message (if you have MyChart) OR . A paper copy in the mail If you have any lab test that is abnormal or we need to change your treatment, we will call you to review the results.  Follow-Up: At [facility], you and your health needs are our priority.  As part of our continuing mission to provide you with exceptional heart care, we have created designated Provider Care Teams.  These Care Teams include your primary Cardiologist (physician) and Advanced Practice Providers (APPs -  Physician Assistants and Nurse Practitioners) who all work together to provide you with the care you need, when you need it. Your next appointment:   4 month(s) The format for your next appointment:   In Person Provider:   Ida Rogue, MD     Signed, Angelena Form, PA-C  11/15/2019 8:50 PM    [facility] [place], [place], [place]  [address] Phone: [phone]; Fax: [phone]

## 2019-11-21 ENCOUNTER — Telehealth: Payer: Self-pay | Admitting: Family Medicine

## 2019-11-21 NOTE — Telephone Encounter (Addendum)
Please check with patient.  I know he had TAVR recently.  I saw the inpatient notes.  Please let me know how he is feeling.  I hope he is doing well.   He also had some blood in his urine-this was reported with his labs.  Is he having any symptoms?  Does he have follow-up set about that, either here or elsewhere?  Please let me know.  Thanks.

## 2019-11-22 NOTE — Telephone Encounter (Signed)
Called and spoke with patient per Dr. Damita Dunnings. Patient stated that he is doing okay after his TAVR and TIA, but he continues to be a little "loopy" sometimes, so his wife does not want him driving. Patient stated that he is following up with urology regarding the blood in his urine. Patient reports that he has burning and frequency with urination, especially at night. Patient stated, "My urologist just shrugs his shoulders because he doesn't know what is wrong." Patient reports that he is following up with the urologist regularly, about every two months. Patient states that he would like to schedule a follow up with Dr.Duncan. Attempted to schedule an appointment for him, but got disconnected. Called patient back and left voicemail for patient to call back and schedule an appointment.

## 2019-11-22 NOTE — Telephone Encounter (Signed)
Noted. Thanks.

## 2019-11-29 DIAGNOSIS — I10 Essential (primary) hypertension: Secondary | ICD-10-CM | POA: Diagnosis not present

## 2019-11-30 ENCOUNTER — Telehealth: Payer: Self-pay | Admitting: *Deleted

## 2019-11-30 DIAGNOSIS — Z79899 Other long term (current) drug therapy: Secondary | ICD-10-CM

## 2019-11-30 DIAGNOSIS — Z952 Presence of prosthetic heart valve: Secondary | ICD-10-CM

## 2019-11-30 DIAGNOSIS — I10 Essential (primary) hypertension: Secondary | ICD-10-CM

## 2019-11-30 LAB — BASIC METABOLIC PANEL
BUN/Creatinine Ratio: 21 (ref 10–24)
BUN: 21 mg/dL (ref 8–27)
CO2: 23 mmol/L (ref 20–29)
Calcium: 9.2 mg/dL (ref 8.6–10.2)
Chloride: 99 mmol/L (ref 96–106)
Creatinine, Ser: 1.01 mg/dL (ref 0.76–1.27)
GFR calc Af Amer: 85 mL/min/{1.73_m2} (ref >59)
GFR calc non Af Amer: 73 mL/min/{1.73_m2} (ref >59)
Glucose: 107 mg/dL — ABNORMAL HIGH (ref 65–99)
Potassium: 4.5 mmol/L (ref 3.5–5.2)
Sodium: 134 mmol/L (ref 134–144)

## 2019-11-30 MED ORDER — SPIRONOLACTONE 25 MG PO TABS: 12.5000 mg | ORAL_TABLET | Freq: Every day | ORAL | 1 refills | Status: DC

## 2019-11-30 NOTE — Telephone Encounter (Signed)
Spoke with the pt and went over medications instructions and needed lab appt, as Nell Range PA-C discussed with him earlier on the phone.  Informed the pt that Joellen Jersey would like for him to add spironolactone 12.5 mg po daily to his regimen, continue monitoring his BPs, and call us back if he continues having elevated readings.  Also informed the pt that Joellen Jersey would like for him to come into the office to have lab (BMET) checked in 2 weeks. Confirmed the pharmacy of choice with the pt.  Scheduled the pt to come in for lab in 2 weeks on 12/14/19.  Pt verbalized understanding and agrees with this plan.

## 2019-11-30 NOTE — Telephone Encounter (Signed)
-----   Message from Eileen Stanford, PA-C sent at 11/30/2019  9:52 AM EST ----- Called pt and he does not want to increase Losartan. He does not want to take a strong diuretic because he already suffers from urinary frequency. He has not tolerated amlodipine in the past. Will plan to start spiro 12.5mg  daily and he will continue to watch his BP and call us if it remains elevated. Can you help me get him set up for a BMET in 2 weeks. Thanks!

## 2019-12-02 ENCOUNTER — Other Ambulatory Visit: Payer: Self-pay | Admitting: Family Medicine

## 2019-12-02 DIAGNOSIS — D649 Anemia, unspecified: Secondary | ICD-10-CM

## 2019-12-03 ENCOUNTER — Other Ambulatory Visit (INDEPENDENT_AMBULATORY_CARE_PROVIDER_SITE_OTHER): Payer: Medicare Other

## 2019-12-03 ENCOUNTER — Other Ambulatory Visit: Payer: Self-pay

## 2019-12-03 DIAGNOSIS — D649 Anemia, unspecified: Secondary | ICD-10-CM

## 2019-12-03 LAB — CBC WITH DIFFERENTIAL/PLATELET
Basophils Absolute: 0 10*3/uL (ref 0.0–0.1)
Basophils Relative: 0.9 % (ref 0.0–3.0)
Eosinophils Absolute: 0.1 10*3/uL (ref 0.0–0.7)
Eosinophils Relative: 1.9 % (ref 0.0–5.0)
HCT: 37.1 % — ABNORMAL LOW (ref 39.0–52.0)
Hemoglobin: 12.7 g/dL — ABNORMAL LOW (ref 13.0–17.0)
Lymphocytes Relative: 20.3 % (ref 12.0–46.0)
Lymphs Abs: 0.9 10*3/uL (ref 0.7–4.0)
MCHC: 34.3 g/dL (ref 30.0–36.0)
MCV: 94 fl (ref 78.0–100.0)
Monocytes Absolute: 0.6 10*3/uL (ref 0.1–1.0)
Monocytes Relative: 13.3 % — ABNORMAL HIGH (ref 3.0–12.0)
Neutro Abs: 2.8 10*3/uL (ref 1.4–7.7)
Neutrophils Relative %: 63.6 % (ref 43.0–77.0)
Platelets: 282 10*3/uL (ref 150.0–400.0)
RBC: 3.94 Mil/uL — ABNORMAL LOW (ref 4.22–5.81)
RDW: 14.5 % (ref 11.5–15.5)
WBC: 4.4 10*3/uL (ref 4.0–10.5)

## 2019-12-11 ENCOUNTER — Encounter: Payer: Self-pay | Admitting: Family Medicine

## 2019-12-11 ENCOUNTER — Other Ambulatory Visit: Payer: Self-pay

## 2019-12-11 ENCOUNTER — Ambulatory Visit (INDEPENDENT_AMBULATORY_CARE_PROVIDER_SITE_OTHER): Payer: Medicare Other | Admitting: Family Medicine

## 2019-12-11 VITALS — BP 134/62 | HR 76 | Temp 97.6°F | Ht 69.0 in | Wt 167.0 lb

## 2019-12-11 DIAGNOSIS — I10 Essential (primary) hypertension: Secondary | ICD-10-CM

## 2019-12-11 DIAGNOSIS — E782 Mixed hyperlipidemia: Secondary | ICD-10-CM | POA: Diagnosis not present

## 2019-12-11 DIAGNOSIS — Z0000 Encounter for general adult medical examination without abnormal findings: Secondary | ICD-10-CM | POA: Diagnosis not present

## 2019-12-11 DIAGNOSIS — Z23 Encounter for immunization: Secondary | ICD-10-CM | POA: Diagnosis not present

## 2019-12-11 DIAGNOSIS — Z7189 Other specified counseling: Secondary | ICD-10-CM

## 2019-12-11 DIAGNOSIS — G459 Transient cerebral ischemic attack, unspecified: Secondary | ICD-10-CM | POA: Diagnosis not present

## 2019-12-11 DIAGNOSIS — R911 Solitary pulmonary nodule: Secondary | ICD-10-CM | POA: Diagnosis not present

## 2019-12-11 NOTE — Patient Instructions (Signed)
Try using nasal saline at night and see if that helps with the cough.   Update me as needed.  Don't change your meds for now.  Take care.  Glad to see you.

## 2019-12-11 NOTE — Progress Notes (Signed)
This visit occurred during the SARS-CoV-2 public health emergency.  Safety protocols were in place, including screening questions prior to the visit, additional usage of staff PPE, and extensive cleaning of exam room while observing appropriate contact time as indicated for disinfecting solutions.  I have personally reviewed the Medicare Annual Wellness questionnaire and have noted 1. The patient's medical and social history 2. Their use of alcohol, tobacco or illicit drugs 3. Their current medications and supplements 4. The patient's functional ability including ADL's, fall risks, home safety risks and hearing or visual             impairment. 5. Diet and physical activities 6. Evidence for depression or mood disorders  The patients weight, height, BMI have been recorded in the chart and visual acuity is per eye clinic.  I have made referrals, counseling and provided education to the patient based review of the above and I have provided the pt with a written personalized care plan for preventive services.  Provider list updated- see scanned forms.  Routine anticipatory guidance given to patient.  See health maintenance. The possibility exists that previously documented standard health maintenance information may have been brought forward from a previous encounter into this note.  If needed, that same information has been updated to reflect the current situation based on today's encounter.    Flu 2021 Shingles 08/2013 out of clinic.  PNA 2014, updated 2017 Tetanus 2016 covid 2021 Colonoscopy 2019-Repeat colonoscopy in 3 years. PSA deferred to uro.   Advance directive- wife designated if patient were incapacitated.  Cognitive function addressed- see scanned forms- and if abnormal then additional documentation follows.   History of anemia.  His hemoglobin has improved over the last month.  Discussed with patient.  TIA d/w pt.  We can follow memory changes here.  Sx likely worse after TIA.   Normal brief testing today.  No red flag events.  No new sx.    MTX per dermatology.  I'll defer.  He agreed.    Hypertension:    Using medication without problems or lightheadedness: yes Chest pain with exertion: no Edema:no Short of breath: not SOB "but I can't go as hard as I would expect."  Unclear how much is due to relative deconditioning with prev valve replacement.    Elevated Cholesterol: Using medications without problems: yes Muscle aches: no Diet compliance: yes Exercise: walking for exercise, can walk about 1/4 mile before resting.    Carotid artery u/s 10/2019.  Discussed with patient. 1. Right carotid artery system: Less than 50% stenosis secondary to mild atherosclerotic plaque formation.  2. Left carotid artery system: Less than 50% stenosis secondary to mild atherosclerotic plaque formation.  3.  Vertebral artery system: Patent with antegrade flow bilaterally.  H/o pulmonary nodules.  Discussed with patient. A few scattered tiny pulmonary nodules are noted in the lungs bilaterally measuring 3 mm or less in size, similar to prior Examinations.  Non-contrast chest CT can be considered in 12 months if patient is high-risk. Due 09/2020.    Some sputum production in early AM, unclear if this is from allergy sx, seasonal.  His lungs are clear and we discussed using nasal saline at night to see if that helps.  PMH and SH reviewed  Meds, vitals, and allergies reviewed.   ROS: Per HPI.  Unless specifically indicated otherwise in HPI, the patient denies:  General: fever. Eyes: acute vision changes ENT: sore throat Cardiovascular: chest pain Respiratory: SOB GI: vomiting GU: dysuria Musculoskeletal: acute  back pain Derm: acute rash Neuro: acute motor dysfunction Psych: worsening mood Endocrine: polydipsia Heme: bleeding Allergy: hayfever  GEN: nad, alert and oriented HEENT: ncat NECK: supple w/o LA CV: rrr. PULM: ctab, no inc wob ABD: soft, +bs EXT: no  edema SKIN: no acute rash

## 2019-12-12 NOTE — Assessment & Plan Note (Signed)
Advance directive- wife designated if patient were incapacitated.  

## 2019-12-12 NOTE — Assessment & Plan Note (Addendum)
A few scattered tiny pulmonary nodules are noted in the lungs bilaterally measuring 3 mm or less in size, similar to prior Examinations.  Non-contrast chest CT can be considered in 12 months if patient is high-risk. Due 09/2020.  Discussed.

## 2019-12-12 NOTE — Assessment & Plan Note (Signed)
We can follow memory changes here.  Sx likely worse after TIA.  Normal brief testing today.  No red flag events.  No new sx.   speech is back to baseline.  Continue aspirin Plavix Lipitor losartan and spironolactone.

## 2019-12-12 NOTE — Assessment & Plan Note (Signed)
Flu 2021 Shingles 08/2013 out of clinic.  PNA 2014, updated 2017 Tetanus 2016 covid 2021 Colonoscopy 2019-Repeat colonoscopy in 3 years. PSA deferred to uro.   Advance directive- wife designated if patient were incapacitated.  Cognitive function addressed- see scanned forms- and if abnormal then additional documentation follows.

## 2019-12-12 NOTE — Assessment & Plan Note (Signed)
Continue aspirin Plavix Lipitor losartan and spironolactone.  Labs discussed with patient.  He agrees.

## 2019-12-12 NOTE — Assessment & Plan Note (Signed)
Continue aspirin Plavix Lipitor losartan and spironolactone.  We talked about his exertion.  It is unclear how much is due to his relative deconditioning prior to his valve replacement versus what his current true exertional capacity should be at age 74.  I would still expect him to make some progress after the valve replacement and we agreed to follow this clinically.

## 2019-12-13 ENCOUNTER — Telehealth: Payer: Self-pay | Admitting: Physician Assistant

## 2019-12-13 DIAGNOSIS — Z952 Presence of prosthetic heart valve: Secondary | ICD-10-CM

## 2019-12-13 NOTE — Telephone Encounter (Signed)
Patient would like to know if he can have his lab work done in [employer] instead of [place].

## 2019-12-13 NOTE — Telephone Encounter (Signed)
Informed the patient he may have his labs drawn at [facility] in [place].  He was grateful for call and agrees with plan.   New BMET order placed and released.

## 2019-12-14 ENCOUNTER — Other Ambulatory Visit: Payer: Medicare Other

## 2019-12-14 DIAGNOSIS — Z952 Presence of prosthetic heart valve: Secondary | ICD-10-CM | POA: Diagnosis not present

## 2019-12-15 LAB — BASIC METABOLIC PANEL
BUN/Creatinine Ratio: 22 (ref 10–24)
BUN: 20 mg/dL (ref 8–27)
CO2: 22 mmol/L (ref 20–29)
Calcium: 9.6 mg/dL (ref 8.6–10.2)
Chloride: 98 mmol/L (ref 96–106)
Creatinine, Ser: 0.91 mg/dL (ref 0.76–1.27)
GFR calc Af Amer: 96 mL/min/{1.73_m2} (ref >59)
GFR calc non Af Amer: 83 mL/min/{1.73_m2} (ref >59)
Glucose: 117 mg/dL — ABNORMAL HIGH (ref 65–99)
Potassium: 4.8 mmol/L (ref 3.5–5.2)
Sodium: 134 mmol/L (ref 134–144)

## 2020-01-28 ENCOUNTER — Other Ambulatory Visit: Payer: Self-pay | Admitting: Physician Assistant

## 2020-01-28 DIAGNOSIS — Z952 Presence of prosthetic heart valve: Secondary | ICD-10-CM

## 2020-01-28 DIAGNOSIS — I10 Essential (primary) hypertension: Secondary | ICD-10-CM

## 2020-01-28 DIAGNOSIS — Z79899 Other long term (current) drug therapy: Secondary | ICD-10-CM

## 2020-02-13 ENCOUNTER — Telehealth: Payer: Self-pay | Admitting: Family Medicine

## 2020-02-13 DIAGNOSIS — IMO0001 Reserved for inherently not codable concepts without codable children: Secondary | ICD-10-CM

## 2020-02-13 DIAGNOSIS — R911 Solitary pulmonary nodule: Secondary | ICD-10-CM

## 2020-02-13 NOTE — Telephone Encounter (Signed)
Due for follow-up CT chest.  I put in the order.  I had talked with patient about getting this follow-up scan, so he should be expecting a phone call.  Thanks.

## 2020-02-15 NOTE — Telephone Encounter (Signed)
Noted  

## 2020-02-21 ENCOUNTER — Other Ambulatory Visit: Payer: Medicare Other

## 2020-03-03 ENCOUNTER — Ambulatory Visit: Admission: RE | Admit: 2020-03-03 | Payer: Medicare Other | Source: Ambulatory Visit

## 2020-03-04 ENCOUNTER — Ambulatory Visit
Admission: RE | Admit: 2020-03-04 | Discharge: 2020-03-04 | Disposition: A | Discharge status: Home / Self Care | Payer: Medicare Other | Source: Ambulatory Visit | Attending: Family Medicine | Admitting: Family Medicine

## 2020-03-04 ENCOUNTER — Other Ambulatory Visit: Payer: Self-pay

## 2020-03-04 DIAGNOSIS — I2510 Atherosclerotic heart disease of native coronary artery without angina pectoris: Secondary | ICD-10-CM | POA: Diagnosis not present

## 2020-03-04 DIAGNOSIS — J929 Pleural plaque without asbestos: Secondary | ICD-10-CM | POA: Diagnosis not present

## 2020-03-04 DIAGNOSIS — IMO0001 Reserved for inherently not codable concepts without codable children: Secondary | ICD-10-CM

## 2020-03-04 DIAGNOSIS — J984 Other disorders of lung: Secondary | ICD-10-CM | POA: Diagnosis not present

## 2020-03-04 DIAGNOSIS — R911 Solitary pulmonary nodule: Secondary | ICD-10-CM | POA: Diagnosis not present

## 2020-03-04 DIAGNOSIS — J438 Other emphysema: Secondary | ICD-10-CM | POA: Diagnosis not present

## 2020-03-09 DIAGNOSIS — H00014 Hordeolum externum left upper eyelid: Secondary | ICD-10-CM | POA: Diagnosis not present

## 2020-03-10 IMAGING — US US SCROTUM W/ DOPPLER COMPLETE
1 series · 13 of 25 positions shown · non-contrast
Comparison: None.

CLINICAL DATA: Left testicular pain

EXAM:
SCROTAL ULTRASOUND
DOPPLER ULTRASOUND OF THE TESTICLES
TECHNIQUE: Complete ultrasound examination of the testicles, epididymis, and
other scrotal structures was performed. Color and spectral Doppler
ultrasound were also utilized to evaluate blood flow to the
testicles.

[Series 1: us scrotum w/ doppler complete · 13 of 89 slices shown]
[im 1/89]
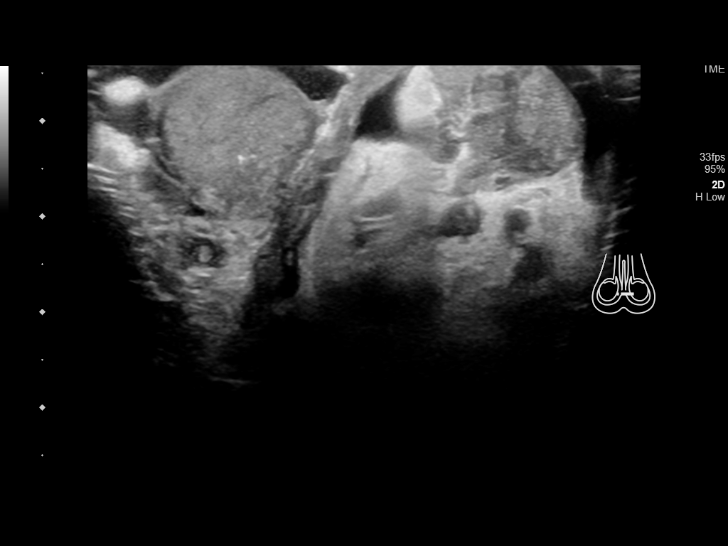
[im 8/89]
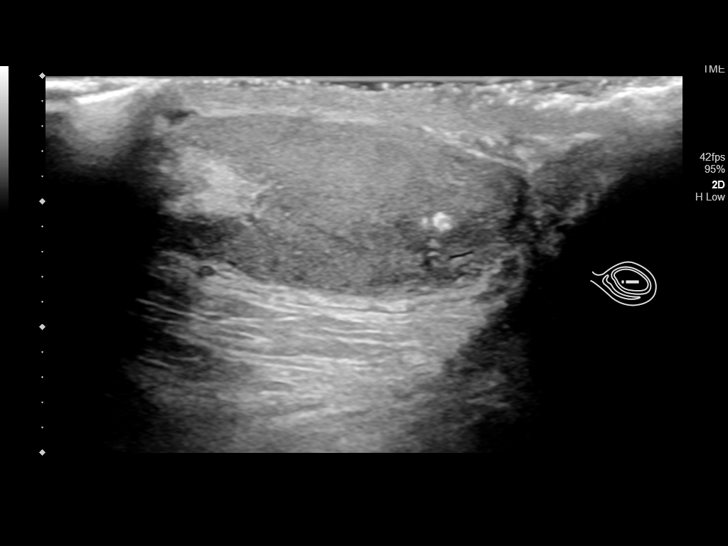
[im 15/89]
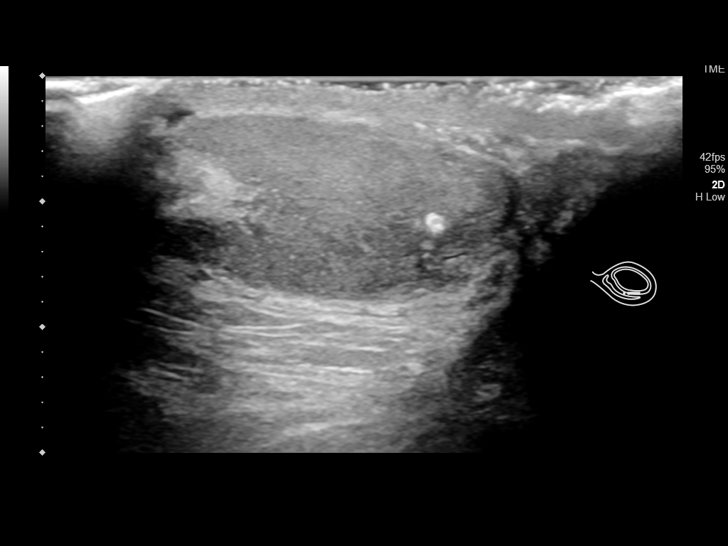
[im 23/89]
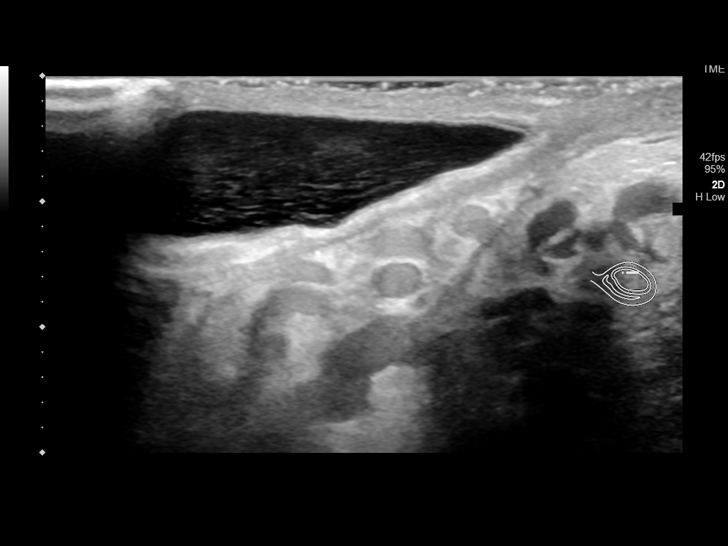
[im 30/89]
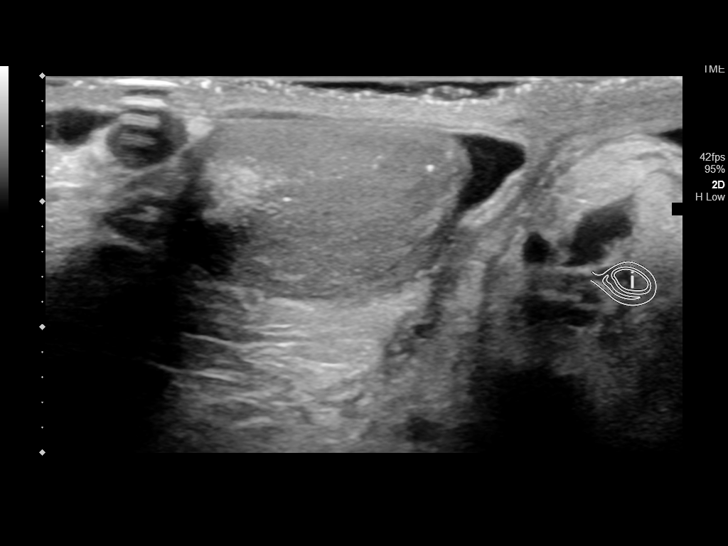
[im 37/89]
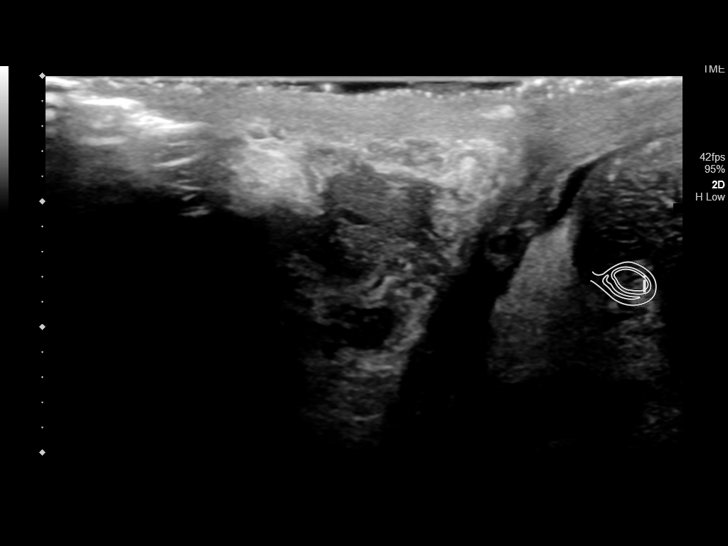
[im 45/89]
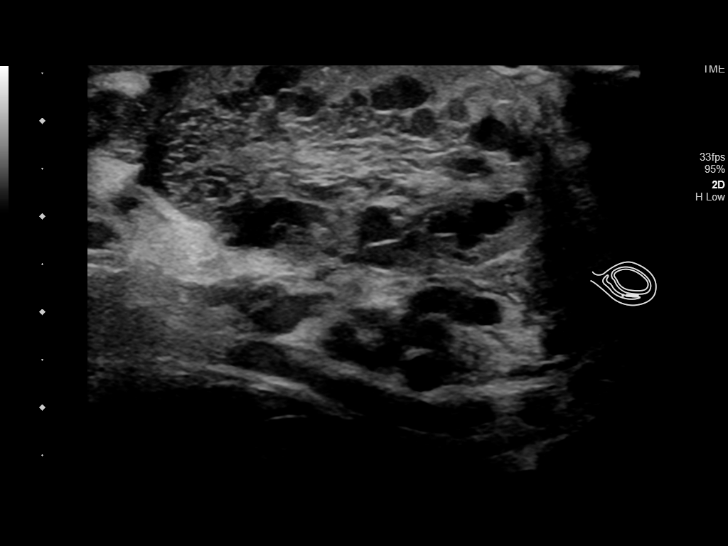
[im 52/89]
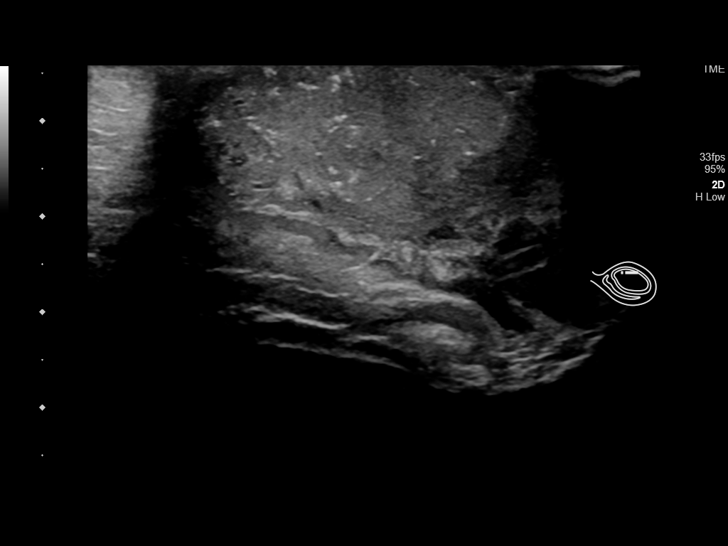
[im 59/89]
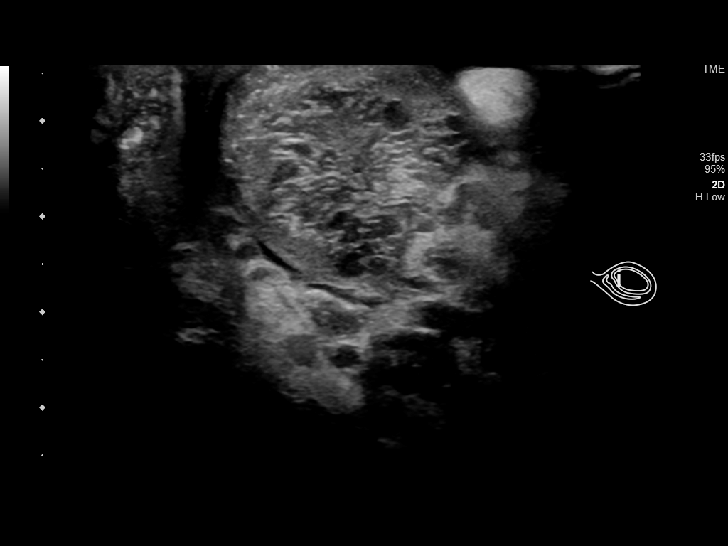
[im 67/89]
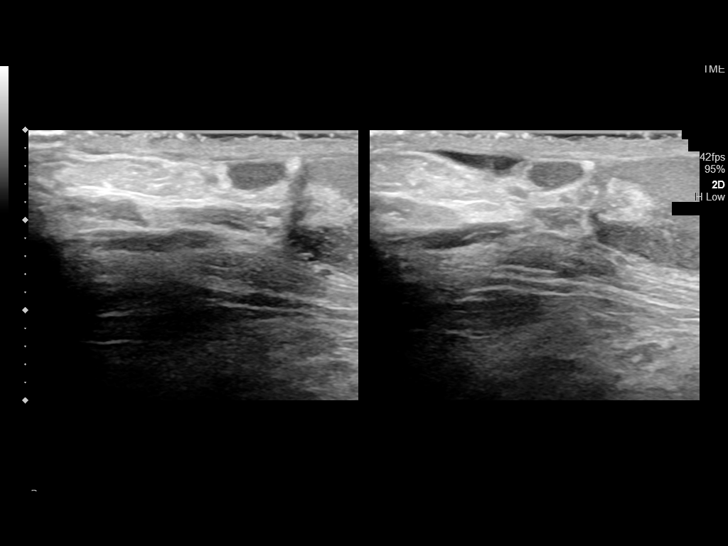
[im 74/89]
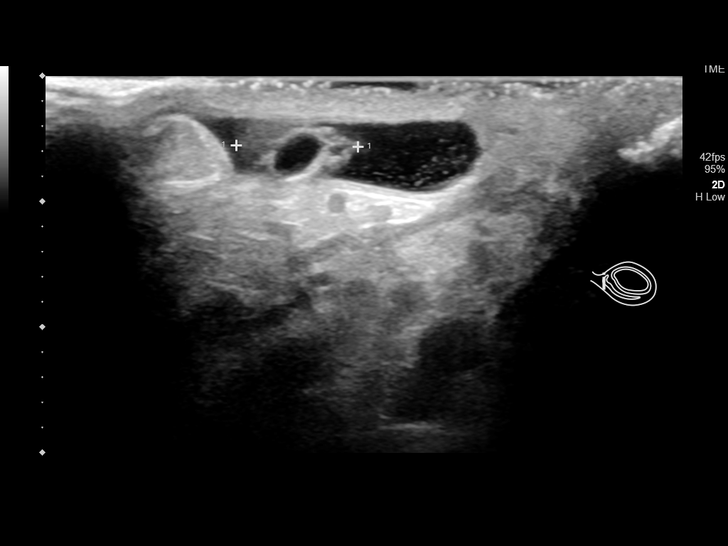
[im 81/89]
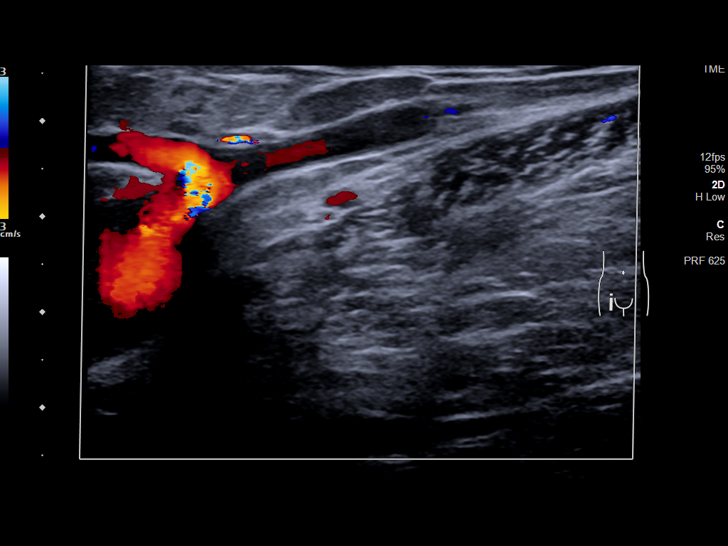
[im 89/89]
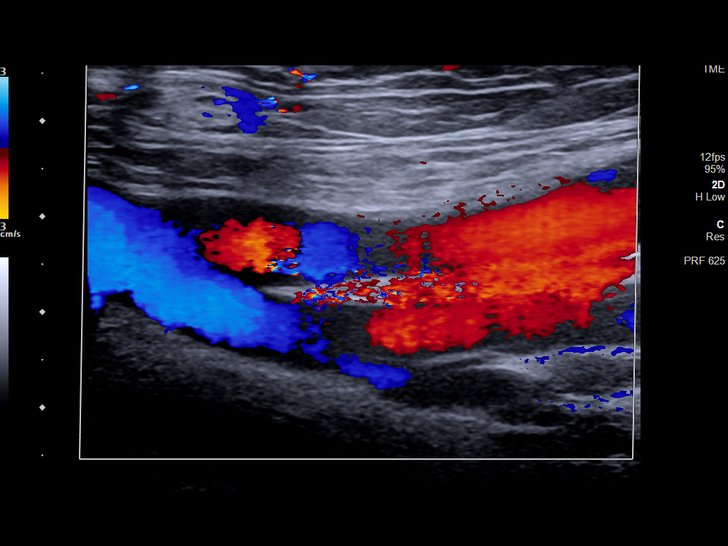

[13 of 25 positions shown; findings below may reference images not displayed]

FINDINGS: Right testicle

Measurements: 2.9 x 1.9 x 2.2 cm. There is scattered
microcalcifications and at least 2 dominant coarse calcifications
within the testicular parenchyma. There is no worrisome focal mass.

Left testicle

Measurements: 4.3 x 2.6 x 2.8 cm. There is tubular ectasia of the
rete testis. There are few small intraparenchymal cysts.
Microcalcifications are noted.

Right epididymis:  A 3.1 mm epididymal head cyst is noted.

Left epididymis: The left-sided epididymis is hypervascular and
enlarged in comparison to the right.

Hydrocele:  There is small bilateral hydroceles.

Varicocele:  There is a left-sided varicocele.

Pulsed Doppler interrogation of both testes demonstrates normal low
resistance arterial and venous waveforms bilaterally.
IMPRESSION: 1. No evidence for testicular torsion.
2. Hypervascular and enlarge left epididymis is suspicious for
left-sided epididymitis in the appropriate clinical setting.
3. There is a left-sided varicocele.
4. Tubular ectasia of the left rete testis.

## 2020-03-15 IMAGING — DX DG CHEST 1V PORT
2 series · 2 of 2 positions shown · non-contrast
Comparison: December 13, 2013

CLINICAL DATA: Palpitations

EXAM:
PORTABLE CHEST 1 VIEW

[chest ap (1 of 2)]
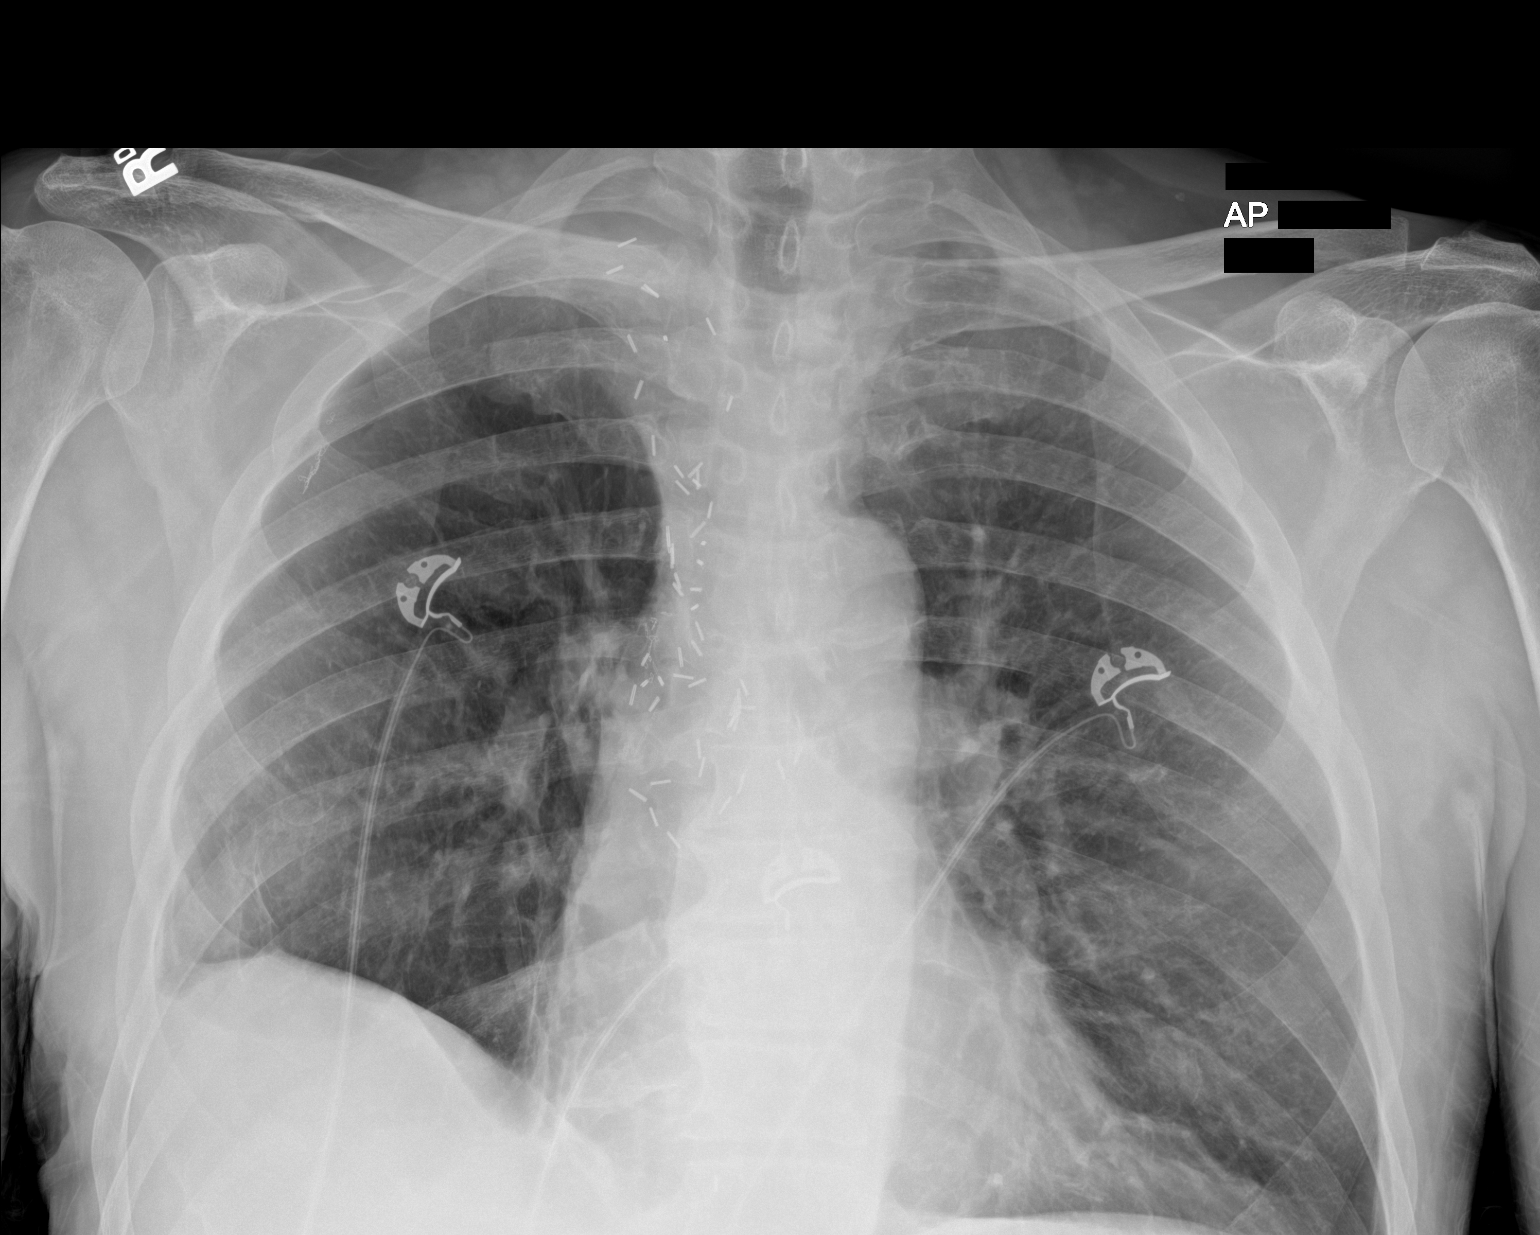

[chest ap (2 of 2)]
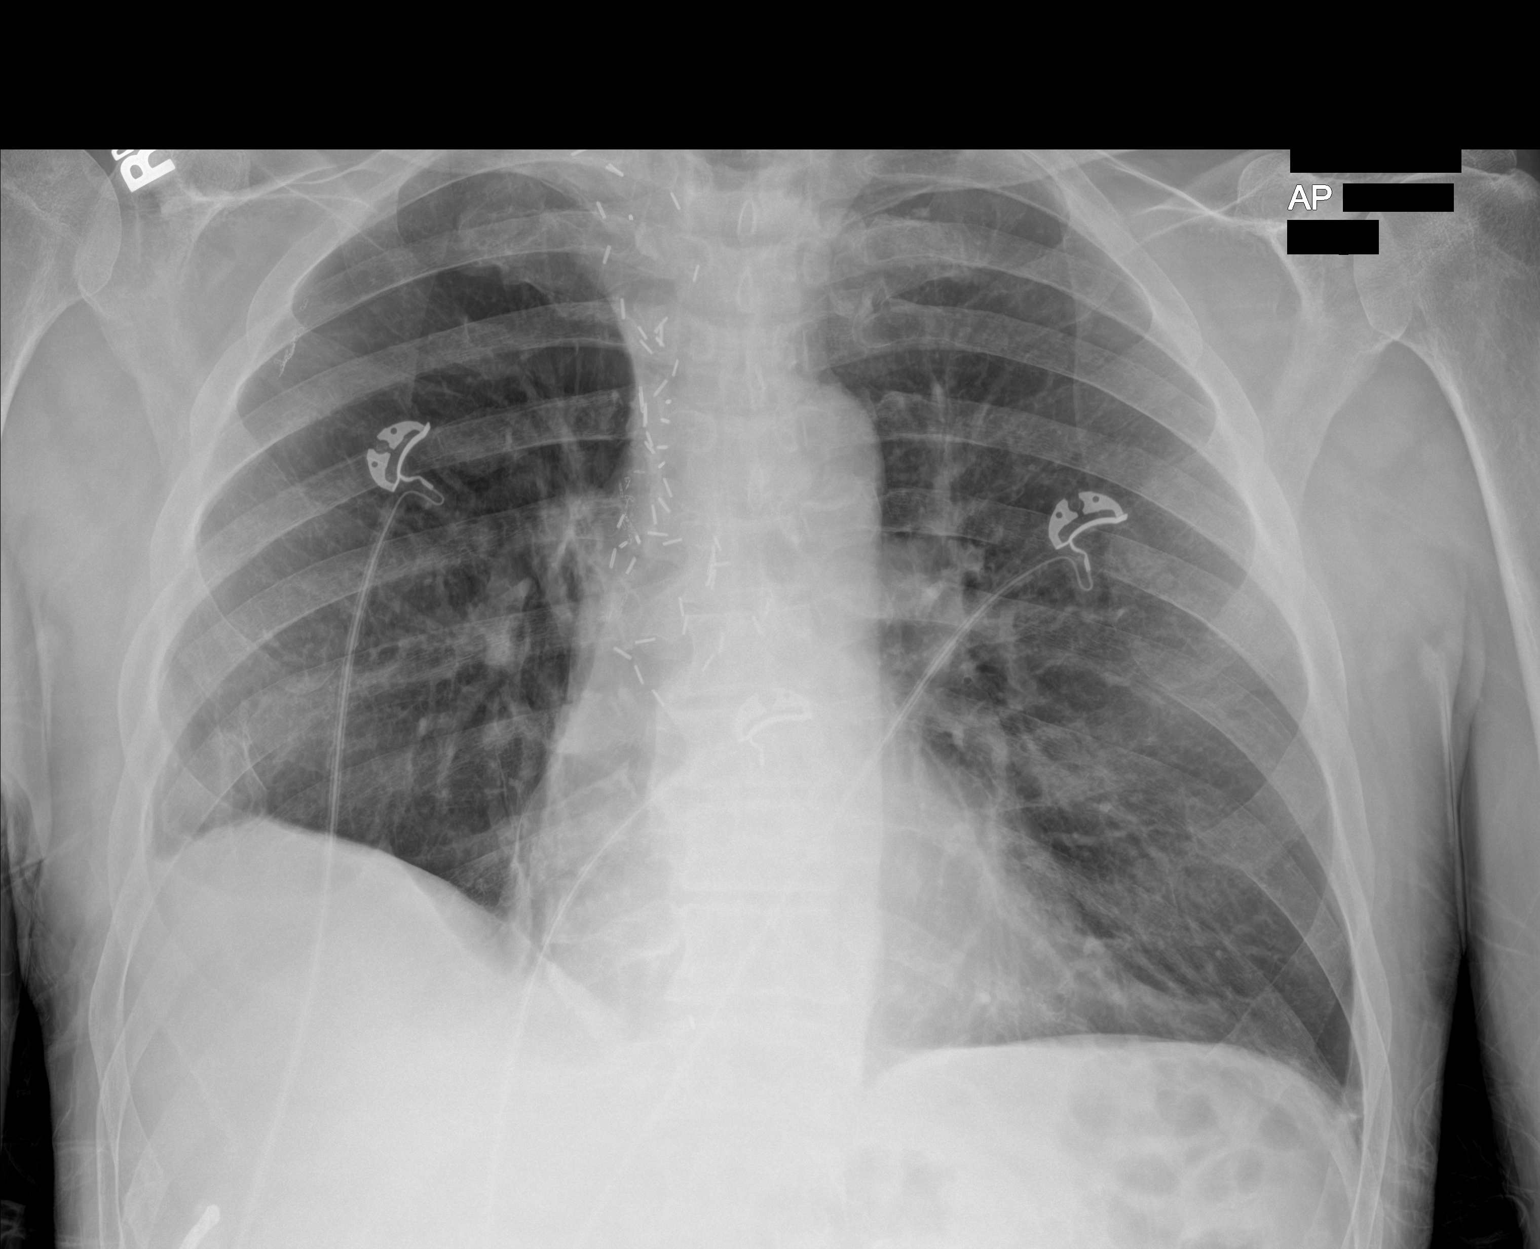

[2 of 2 positions shown; findings below may reference images not displayed]

FINDINGS: There are postsurgical changes throughout the right hemithorax with
volume loss and elevation of the right hemidiaphragm. Chronic lung
changes are noted bilaterally.
IMPRESSION: No active disease.

## 2020-03-17 NOTE — Progress Notes (Signed)
Cardiology Office Note  Date:  03/18/2020   ID:  Brett Mcguire, DOB Oct 27, 1945, MRN [id number]  PCP:  Tonia Ghent, MD   Chief Complaint  Patient presents with  . 4 month follow up     Patient c/o some shortness of breath and fatigue but is much better since the aortic valve replacement. Medications reviewed by the patient verbally.     HPI:  Brett Mcguire is a very pleasant 75 year old gentleman with prior history of  smoking for 17 years,  17 years of secondhand exposure per the patient,  lung cancer with resection of the right upper lobe in 1989,  quit smoking in 1984,  chronic mild shortness of breath   who presents for follow-up of his  aortic valve stenosis TAVR  Last seen in the clinic July 2021  Reports having done well since his TAVR  In the hospital 10/28-10/29/21 for TIA.  Echo showed EF 60%, normally functioning TAVR with a mean gradient of 9 mmHg and no PVL. Bubble study was also performed which was negative.  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 There was mild to moderate stenosis. There was trivial regurgitation. Peak velocity 311, Peak gradient (S): 39 mm Hg.  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  PSH:    Past Surgical History:  Procedure Laterality Date  . COLONOSCOPY  02/18/2000   Polyp, diverticulosis, repeat in 5 years  . COLONOSCOPY  03/03/2006   Repeat  -  Divertics  . COLONOSCOPY WITH PROPOFOL N/A 12/22/2017   Procedure: COLONOSCOPY WITH PROPOFOL;  Surgeon: Virgel Manifold, MD;  Location: [facility]COPY;  Service: Endoscopy;  Laterality: N/A;  . CYSTOSCOPY WITH INSERTION OF UROLIFT    . Laminectomy/Discectomy  11/03/2005   L5/S1  . Malignant melanoma excision  06/20/2003   Dr. Nehemiah Massed  . MRI  10/09/2005   Lumbar spine, bulging disc L5/S1  . Right lobectomy  1989   Lung cancer  . RIGHT/LEFT HEART CATH AND CORONARY ANGIOGRAPHY Bilateral 09/06/2019   Procedure: RIGHT/LEFT HEART CATH AND CORONARY ANGIOGRAPHY;  Surgeon: Minna Merritts, MD;  Location: [place] [facility];  Service: Cardiovascular;  Laterality: Bilateral;  . TEE WITHOUT CARDIOVERSION N/A 10/02/2019   Procedure: TRANSESOPHAGEAL ECHOCARDIOGRAM (TEE);  Surgeon: Sherren Mocha, MD;  Location: [place];  Service: Open Heart Surgery;  Laterality: N/A;  . TONSILLECTOMY AND ADENOIDECTOMY    . TRANSCATHETER AORTIC VALVE REPLACEMENT, TRANSFEMORAL  10/02/2019   Transcatheter Aortic Valve Replacement - Percutaneous Left Transfemoral Approach  . TRANSCATHETER AORTIC VALVE REPLACEMENT, TRANSFEMORAL N/A 10/02/2019   Procedure: TRANSCATHETER AORTIC VALVE REPLACEMENT, TRANSFEMORAL USING  EDWARDS SAPIEN 3 ULTRA 26 MM THV.;  Surgeon: Sherren Mocha, MD;  Location: [place];  Service: Open Heart Surgery;  Laterality: N/A;    Current Outpatient Medications  Medication Sig Dispense Refill  . aspirin 81 MG chewable tablet Chew 1 tablet (81 mg total) by mouth daily.    Marland Kitchen atorvastatin (LIPITOR) 40 MG tablet Take 1 tablet (40 mg total) by  mouth daily. 90 tablet 3  . clopidogrel (PLAVIX) 75 MG tablet Take 1 tablet (75 mg total) by mouth daily with breakfast. 90 tablet 1  . folic acid (FOLVITE) 1 MG tablet Take 1 mg by mouth See admin instructions. Take 1 tablet (1 mg) by mouth on 6 days in the week, do NOT take on Mondays.    Marland Kitchen losartan (COZAAR) 50 MG tablet Take 1 tablet (50 mg total) by mouth daily. 90 tablet 3  . methotrexate 2.5 MG tablet Take 10 mg by mouth every Monday.     Brett Mcguire Glycol-Propyl Glycol (SYSTANE ULTRA) 0.4-0.3 % SOLN Place 1 drop into both eyes daily as needed (dry/irritated eyes.).     Marland Kitchen Skin Protectants, Misc. (EUCERIN) cream Apply 1 application topically daily as needed for dry skin.    Marland Kitchen spironolactone (ALDACTONE) 25 MG tablet TAKE 0.5 TABLET BY MOUTH DAILY. 45 tablet 2  . amoxicillin (AMOXIL) 500 MG capsule Take 2,000 mg by mouth as directed. Take 2,000 mg one hour prior to all dental visits. (Patient not taking: Reported on 03/18/2020)     No current facility-administered medications for this visit.    Allergies:   Ephedrine, Finasteride, Sulfa antibiotics, Sulfonamide derivatives, Tadalafil, Alfuzosin, Doxazosin, Flomax [tamsulosin hcl], and Rapaflo [silodosin]   Social History:  The patient  reports that he quit smoking about 38 years ago. His smoking use included cigarettes. He has a 34.00 pack-year smoking history. He has never used smokeless tobacco. He reports that he does not drink alcohol and does not use drugs.   Family History:   family history includes Alcohol abuse in his mother; Cancer in his father, mother, and another family member; Prostate cancer in his father.    Review of Systems: Review of Systems  Constitutional: Negative.   HENT: Negative.   Respiratory: Negative.   Cardiovascular: Negative.   Gastrointestinal: Negative.   Musculoskeletal: Negative.   Neurological: Negative.   Psychiatric/Behavioral: Negative.   All other systems reviewed and are negative.   PHYSICAL  EXAM: VS:  BP (!) 150/70 (BP Location: Left Arm, Patient Position: Sitting, Cuff Size: Normal)   Pulse 62   Ht 5' 8.5" (1.74 m)   Wt 173 lb 2 oz (78.5 kg)   SpO2 98%   BMI 25.94 kg/m  , BMI Body mass index is 25.94 kg/m. Constitutional:  oriented to person, place, and time. No distress.  HENT:  Head: Grossly normal Eyes:  no discharge. No scleral icterus.  Neck: No JVD, no carotid bruits  Cardiovascular: Regular rate and rhythm, trace 1/6 murmur appreciated Pulmonary/Chest: Clear to auscultation bilaterally, no wheezes or rails Abdominal: Soft.  no distension.  no tenderness.  Musculoskeletal: Normal range of motion Neurological:  normal muscle tone. Coordination normal. No atrophy Skin: Skin warm and dry Psychiatric: normal affect, pleasant   Recent Labs: 09/28/2019: B Natriuretic Peptide 64.8 11/08/2019: ALT 19; TSH 1.626 11/09/2019: Magnesium 2.3 12/03/2019: Hemoglobin 12.7; Platelets 282.0 12/14/2019: BUN 20; Creatinine, Ser 0.91; Potassium 4.8; Sodium 134    Lipid Panel Lab Results  Component Value Date   CHOL 152 11/08/2019  HDL 48 11/08/2019   LDLCALC NOT CALCULATED 11/08/2019   TRIG 117 11/08/2019      Wt Readings from Last 3 Encounters:  03/18/20 173 lb 2 oz (78.5 kg)  12/11/19 167 lb (75.8 kg)  11/15/19 167 lb (75.8 kg)      ASSESSMENT AND PLAN:  Aortic valve disease - Plan: EKG 12-Lead Severe stenosis, s/p TAVR Echo, reviewed with him On aspirin Plavix He would like to stay on Plavix little bit longer given recent TIA symptoms  TIA Moderate stenosis of vessel noted, agree with higher dose Lipitor 40 for goal LDL less than 70 He will stay on aspirin Plavix for now, would like to go for additional several months with the Plavix  Essential hypertension - Plan: EKG 12-Lead Losartan up to 100 daily Spiro 12.5 daily Recommend he monitor blood pressure and call our office with numbers  Chronic fatigue Managed by primary care  Mixed hyperlipidemia  - Plan: EKG 12-Lead Tolerating Lipitor 40 Goal LDL less than 70  Carotid arterial stenosis Mild bilateral disease on study in 2017, less than 39%  Shortness of breath Long history of smoking, prior lung cancer Improved symptoms following TAVR    Total encounter time more than 25 minutes  Greater than 50% was spent in counseling and coordination of care with the patient    No orders of the defined types were placed in this encounter.    Signed, Esmond Plants, M.D., Ph.D. 03/18/2020  [place], [place]

## 2020-03-18 ENCOUNTER — Ambulatory Visit (INDEPENDENT_AMBULATORY_CARE_PROVIDER_SITE_OTHER): Payer: Medicare Other | Admitting: Cardiovascular Disease

## 2020-03-18 ENCOUNTER — Encounter: Payer: Self-pay | Admitting: Cardiovascular Disease

## 2020-03-18 ENCOUNTER — Other Ambulatory Visit: Payer: Self-pay

## 2020-03-18 VITALS — BP 150/70 | HR 62 | Ht 68.5 in | Wt 173.1 lb

## 2020-03-18 DIAGNOSIS — I739 Peripheral vascular disease, unspecified: Secondary | ICD-10-CM | POA: Diagnosis not present

## 2020-03-18 DIAGNOSIS — I10 Essential (primary) hypertension: Secondary | ICD-10-CM | POA: Diagnosis not present

## 2020-03-18 DIAGNOSIS — E782 Mixed hyperlipidemia: Secondary | ICD-10-CM

## 2020-03-18 DIAGNOSIS — R0602 Shortness of breath: Secondary | ICD-10-CM

## 2020-03-18 DIAGNOSIS — Z952 Presence of prosthetic heart valve: Secondary | ICD-10-CM | POA: Diagnosis not present

## 2020-03-18 DIAGNOSIS — I350 Nonrheumatic aortic (valve) stenosis: Secondary | ICD-10-CM

## 2020-03-18 DIAGNOSIS — I779 Disorder of arteries and arterioles, unspecified: Secondary | ICD-10-CM

## 2020-03-18 MED ORDER — LOSARTAN POTASSIUM 100 MG PO TABS
100.0000 mg | ORAL_TABLET | Freq: Every day | ORAL | 3 refills | Status: DC
Start: 2020-03-18 — End: 2020-09-24

## 2020-03-18 NOTE — Patient Instructions (Addendum)
Monitor blood pressure, call if elevated  Medication Instructions:  Losartan up to 100 mg daily  If you need a refill on your cardiac medications before your next appointment, please call your pharmacy.    Lab work: No new labs needed   If you have labs (blood work) drawn today and your tests are completely normal, you will receive your results only by: Marland Kitchen MyChart Message (if you have MyChart) OR . A paper copy in the mail If you have any lab test that is abnormal or we need to change your treatment, we will call you to review the results.   Testing/Procedures: No new testing needed   Follow-Up: At [facility], you and your health needs are our priority.  As part of our continuing mission to provide you with exceptional heart care, we have created designated Provider Care Teams.  These Care Teams include your primary Cardiologist (physician) and Advanced Practice Providers (APPs -  Physician Assistants and Nurse Practitioners) who all work together to provide you with the care you need, when you need it.  . You will need a follow up appointment in 6 months  . Providers on your designated Care Team:   . Murray Hodgkins, NP . Christell Faith, PA-C . Marrianne Mood, PA-C  Any Other Special Instructions Will Be Listed Below (If Applicable).  COVID-19 Vaccine Information can be found at: [email or web address].uk For questions related to vaccine distribution or appointments, please email [email or web address] or call [phone].

## 2020-03-31 ENCOUNTER — Other Ambulatory Visit: Payer: Self-pay | Admitting: Physician Assistant

## 2020-04-17 DIAGNOSIS — D2262 Melanocytic nevi of left upper limb, including shoulder: Secondary | ICD-10-CM | POA: Diagnosis not present

## 2020-04-17 DIAGNOSIS — L821 Other seborrheic keratosis: Secondary | ICD-10-CM | POA: Diagnosis not present

## 2020-04-17 DIAGNOSIS — D2272 Melanocytic nevi of left lower limb, including hip: Secondary | ICD-10-CM | POA: Diagnosis not present

## 2020-04-17 DIAGNOSIS — Z85828 Personal history of other malignant neoplasm of skin: Secondary | ICD-10-CM | POA: Diagnosis not present

## 2020-04-17 DIAGNOSIS — L2084 Intrinsic (allergic) eczema: Secondary | ICD-10-CM | POA: Diagnosis not present

## 2020-04-17 DIAGNOSIS — D2261 Melanocytic nevi of right upper limb, including shoulder: Secondary | ICD-10-CM | POA: Diagnosis not present

## 2020-04-17 DIAGNOSIS — Z85820 Personal history of malignant melanoma of skin: Secondary | ICD-10-CM | POA: Diagnosis not present

## 2020-04-18 DIAGNOSIS — Z79899 Other long term (current) drug therapy: Secondary | ICD-10-CM | POA: Diagnosis not present

## 2020-04-22 ENCOUNTER — Other Ambulatory Visit: Payer: Self-pay | Admitting: Urology

## 2020-04-22 ENCOUNTER — Other Ambulatory Visit (HOSPITAL_COMMUNITY): Payer: Self-pay | Admitting: Urology

## 2020-04-22 DIAGNOSIS — R972 Elevated prostate specific antigen [PSA]: Secondary | ICD-10-CM | POA: Diagnosis not present

## 2020-04-22 DIAGNOSIS — N2889 Other specified disorders of kidney and ureter: Secondary | ICD-10-CM

## 2020-05-12 ENCOUNTER — Other Ambulatory Visit: Payer: Self-pay

## 2020-05-12 ENCOUNTER — Ambulatory Visit
Admission: RE | Admit: 2020-05-12 | Discharge: 2020-05-12 | Disposition: A | Discharge status: Home / Self Care | Payer: Medicare Other | Source: Ambulatory Visit | Attending: Urology | Admitting: Urology

## 2020-05-12 DIAGNOSIS — N281 Cyst of kidney, acquired: Secondary | ICD-10-CM | POA: Diagnosis not present

## 2020-05-12 DIAGNOSIS — K7689 Other specified diseases of liver: Secondary | ICD-10-CM | POA: Diagnosis not present

## 2020-05-12 DIAGNOSIS — N2889 Other specified disorders of kidney and ureter: Secondary | ICD-10-CM | POA: Insufficient documentation

## 2020-05-12 DIAGNOSIS — I700 Atherosclerosis of aorta: Secondary | ICD-10-CM | POA: Diagnosis not present

## 2020-05-12 DIAGNOSIS — R3129 Other microscopic hematuria: Secondary | ICD-10-CM | POA: Diagnosis not present

## 2020-05-12 MED ORDER — IOHEXOL 300 MG/ML  SOLN
100.0000 mL | Freq: Once | INTRAMUSCULAR | Status: AC | PRN
  Administered 2020-05-12: 100 mL via INTRAVENOUS

## 2020-05-13 LAB — POCT I-STAT CREATININE: Creatinine, Ser: 1 mg/dL (ref 0.61–1.24)

## 2020-05-14 DIAGNOSIS — R972 Elevated prostate specific antigen [PSA]: Secondary | ICD-10-CM | POA: Diagnosis not present

## 2020-05-14 DIAGNOSIS — D400 Neoplasm of uncertain behavior of prostate: Secondary | ICD-10-CM | POA: Diagnosis not present

## 2020-05-14 DIAGNOSIS — R3129 Other microscopic hematuria: Secondary | ICD-10-CM | POA: Diagnosis not present

## 2020-05-27 DIAGNOSIS — D400 Neoplasm of uncertain behavior of prostate: Secondary | ICD-10-CM | POA: Diagnosis not present

## 2020-05-27 DIAGNOSIS — R3129 Other microscopic hematuria: Secondary | ICD-10-CM | POA: Diagnosis not present

## 2020-05-27 DIAGNOSIS — N401 Enlarged prostate with lower urinary tract symptoms: Secondary | ICD-10-CM | POA: Diagnosis not present

## 2020-05-27 DIAGNOSIS — R972 Elevated prostate specific antigen [PSA]: Secondary | ICD-10-CM | POA: Diagnosis not present

## 2020-06-19 DIAGNOSIS — H25813 Combined forms of age-related cataract, bilateral: Secondary | ICD-10-CM | POA: Diagnosis not present

## 2020-06-20 IMAGING — CT CT CHEST W/ CM
2 of 5 series · 12 of 36 positions shown, 15 images · IV contrast (omnipaque)
Comparison: CT abdomen pelvis 07/04/2006.

CLINICAL DATA: Skin rash, possible malignancy.

EXAM:
CT CHEST, ABDOMEN, AND PELVIS WITH CONTRAST
TECHNIQUE: Multidetector CT imaging of the chest, abdomen and pelvis was
performed following the standard protocol during bolus
administration of intravenous contrast.
CONTRAST:  100mL OMNIPAQUE IOHEXOL 300 MG/ML  SOLN

[Series 2: axials cap 5.00 · axial · 0.74mm/px · z∈[-1495,-955]mm · 9 of 136 slices shown, 12 images]
[im 14/136  mediastinal]
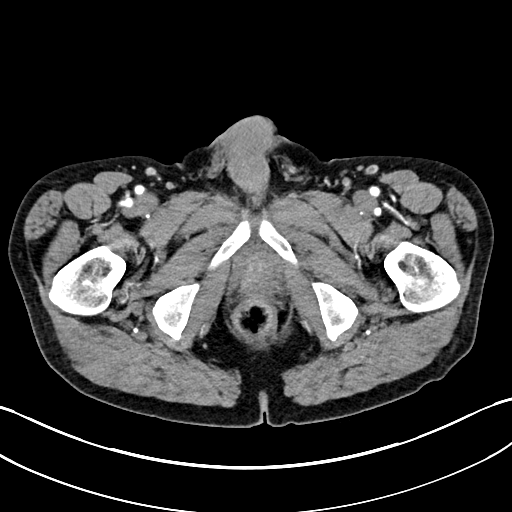
[im 14/136  lung]
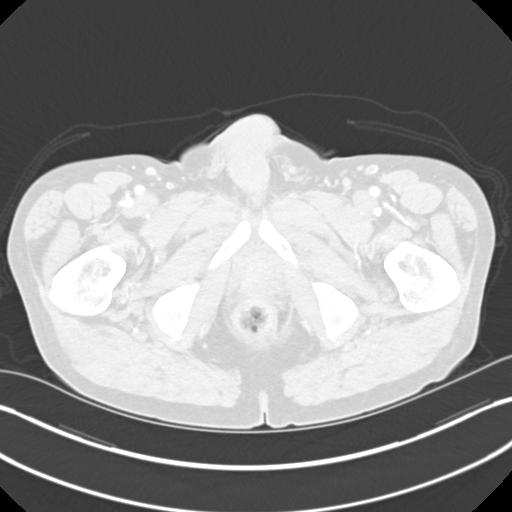
[im 28/136  lung]
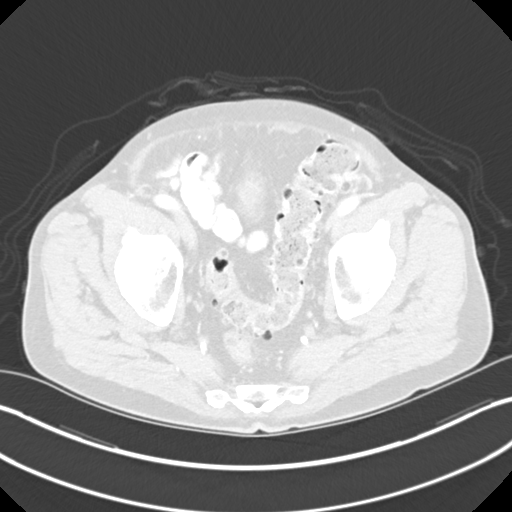
[im 41/136  lung]
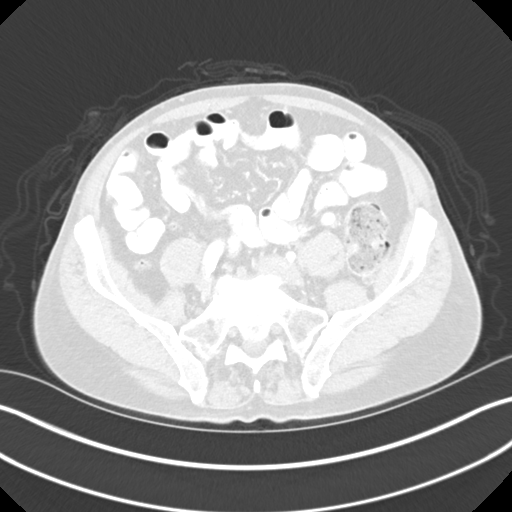
[im 55/136  lung]
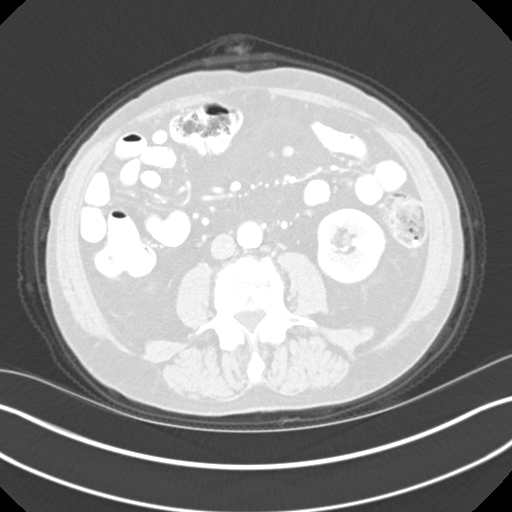
[im 68/136  mediastinal]
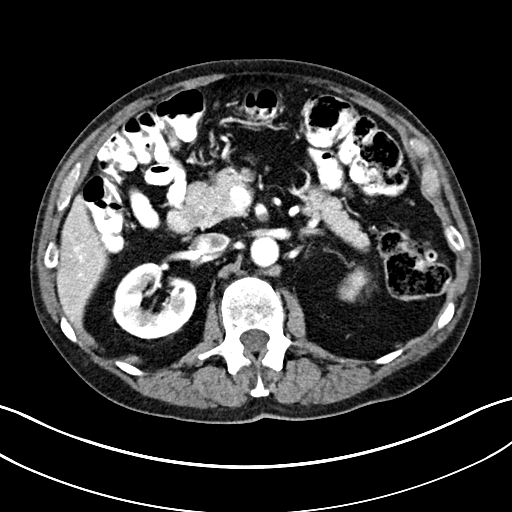
[im 68/136  lung]
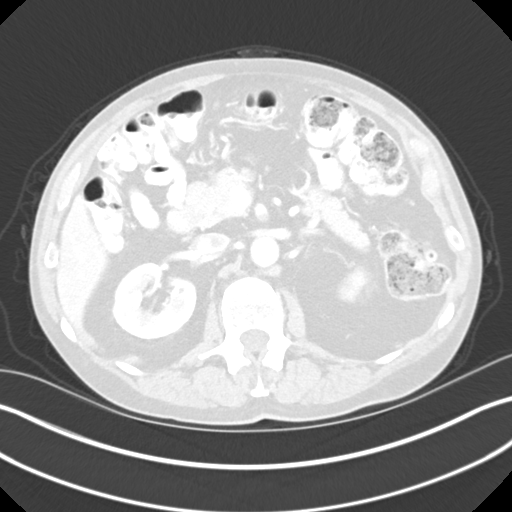
[im 82/136  lung]
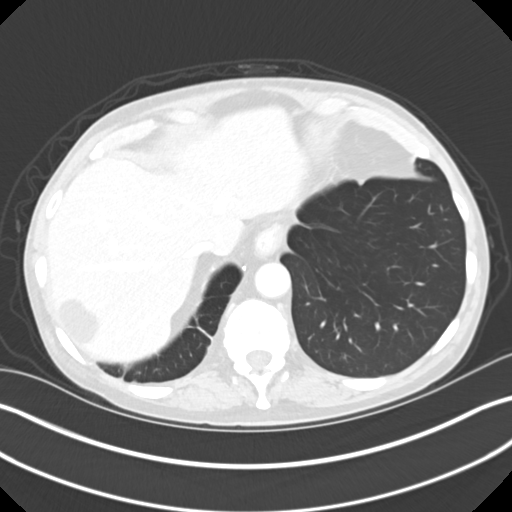
[im 95/136  lung]
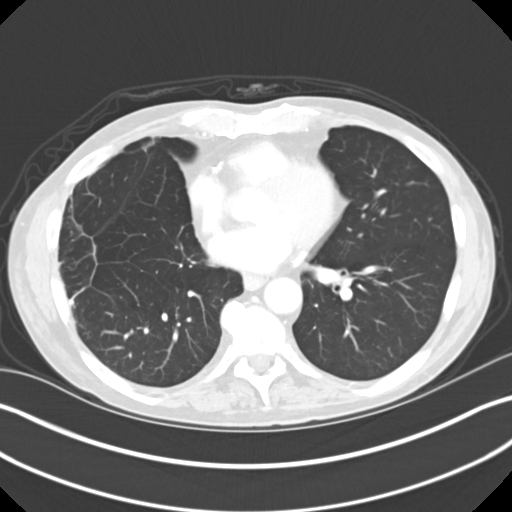
[im 109/136  lung]
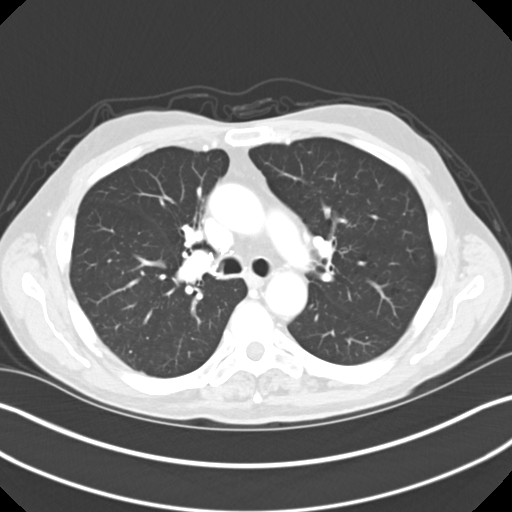
[im 122/136  mediastinal]
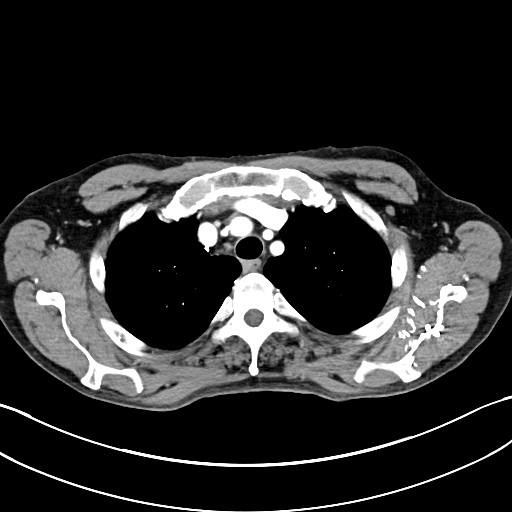
[im 122/136  lung]
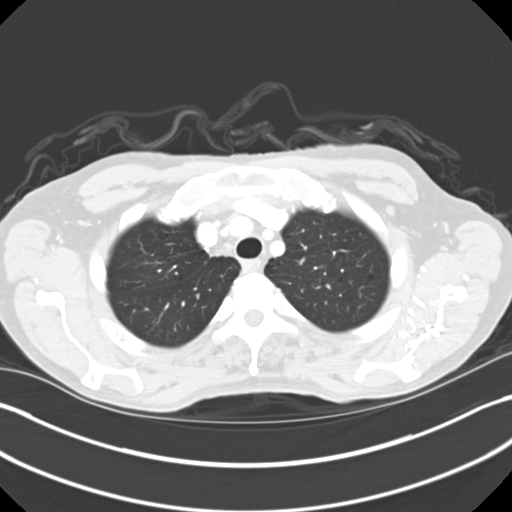

[Series 4: coronals cap 2.00 cor · coronal · 0.74mm/px · 3 of 140 slices shown]
[im 28/140  lung]
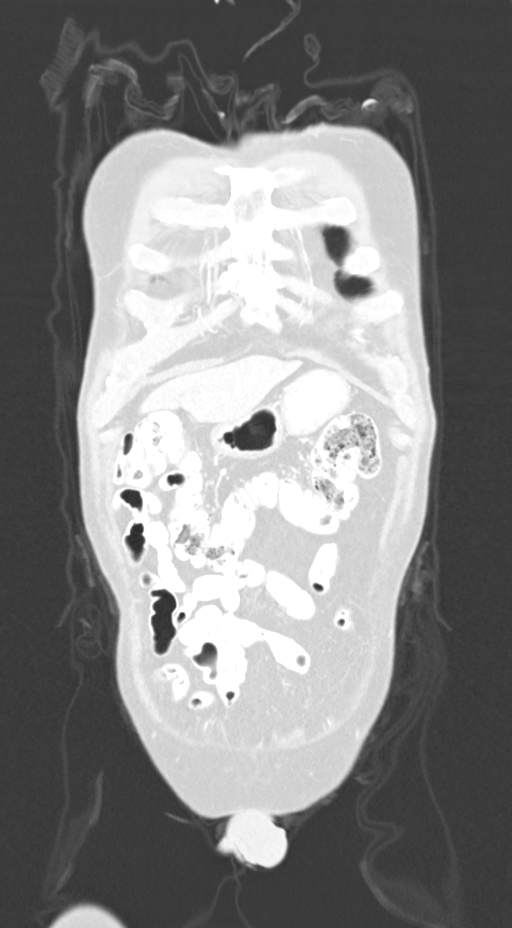
[im 56/140  lung]
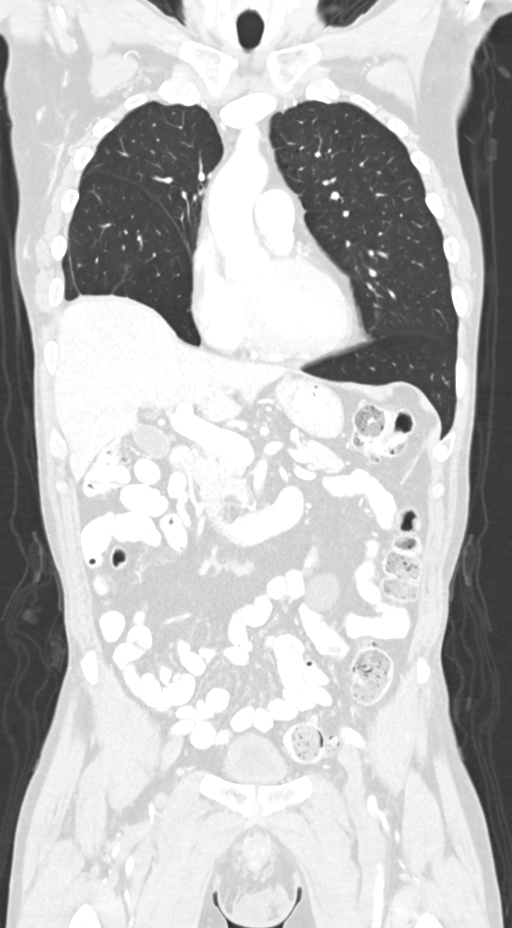
[im 84/140  lung]
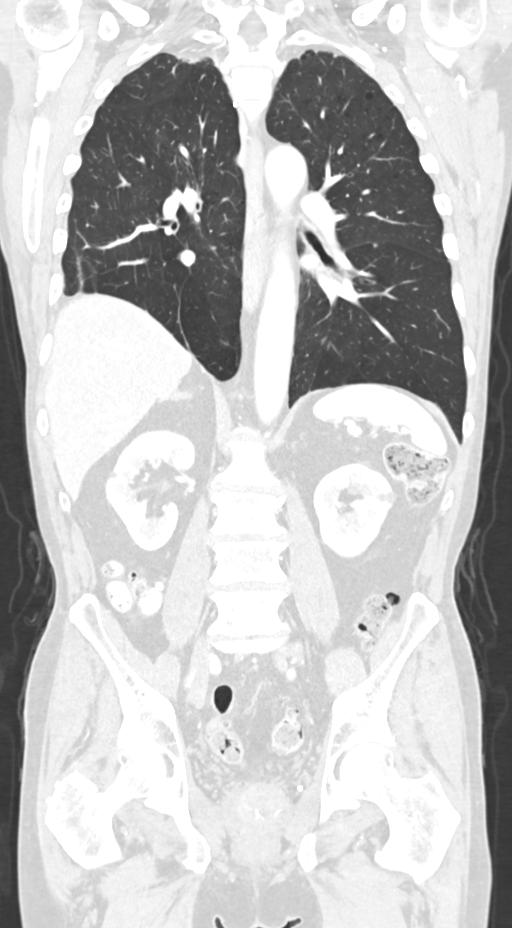

[12 of 36 positions shown; findings below may reference images not displayed]

FINDINGS: CT CHEST FINDINGS

Cardiovascular: Atherosclerotic calcification of the aorta and
coronary arteries. Heart size normal. No pericardial effusion.

Mediastinum/Nodes: 8 mm low-attenuation right thyroid nodule. No
followup recommended (ref: [HOSPITAL]. [DATE]):
143-50).No pathologically enlarged mediastinal, hilar or axillary
lymph nodes. There are surgical clips in the mediastinum. Esophagus
is grossly unremarkable.

Lungs/Pleura: Biapical pleuroparenchymal scarring, right greater
than left. Centrilobular and paraseptal emphysema. Right upper
lobectomy. A few scattered tiny peripheral pulmonary nodules measure
up to 3 mm (3/65). No pleural fluid. Airway is otherwise
unremarkable.

Musculoskeletal: No worrisome lytic or sclerotic lesions.

CT ABDOMEN PELVIS FINDINGS

Hepatobiliary: Low-attenuation lesions in the liver measure up to
4.0 cm in the right hepatic lobe and are indicative of cysts. Liver
and gallbladder are otherwise unremarkable. No biliary ductal
dilatation.

Pancreas: Negative.

Spleen: Negative.

Adrenals/Urinary Tract: Adrenal glands are unremarkable.
Low-attenuation lesion off the lower pole left kidney measures
cm and is likely a cyst. Other low-density lesions in the kidneys
bilaterally are too small to definitively characterize but
statistically, cysts are likely. Ureters are decompressed. Bladder
appears thick-walled but is low in volume.

Stomach/Bowel: Stomach, small bowel, appendix and colon are
unremarkable.

Vascular/Lymphatic: Atherosclerotic calcification of the aorta
without aneurysm. No pathologically enlarged lymph nodes.

Reproductive: Brachytherapy seeds are seen in the prostate.

Other: No free fluid.  Mesenteries and peritoneum are unremarkable.

Musculoskeletal: Degenerative changes in the spine. No worrisome
lytic lesions.
IMPRESSION: 1. No evidence of primary malignancy in the chest, abdomen or
pelvis.
2. Scattered peripheral pulmonary nodules measure 3 mm or less in
size. No follow-up needed if patient is low-risk (and has no known
or suspected primary neoplasm). Non-contrast chest CT can be
considered in 12 months if patient is high-risk. This recommendation
follows the consensus statement: Guidelines for Management of
Incidental Pulmonary Nodules Detected on CT Images: From the
3. Aortic atherosclerosis (1HB1R-CE9.9). Coronary artery
calcification.
4.  Emphysema (1HB1R-34C.E).

## 2020-06-20 IMAGING — CT CT ABD-PELV W/ CM
2 of 5 series · 14 of 46 positions shown, 16 images · IV contrast (omnipaque)
Comparison: CT abdomen pelvis 07/04/2006.

CLINICAL DATA: Skin rash, possible malignancy.

EXAM:
CT CHEST, ABDOMEN, AND PELVIS WITH CONTRAST
TECHNIQUE: Multidetector CT imaging of the chest, abdomen and pelvis was
performed following the standard protocol during bolus
administration of intravenous contrast.
CONTRAST:  100mL OMNIPAQUE IOHEXOL 300 MG/ML  SOLN

[Series 2: axials cap 5.00 · axial · 0.74mm/px · z∈[-1505,-945]mm · 11 of 136 slices shown, 13 images]
[im 12/136  soft-tissue]
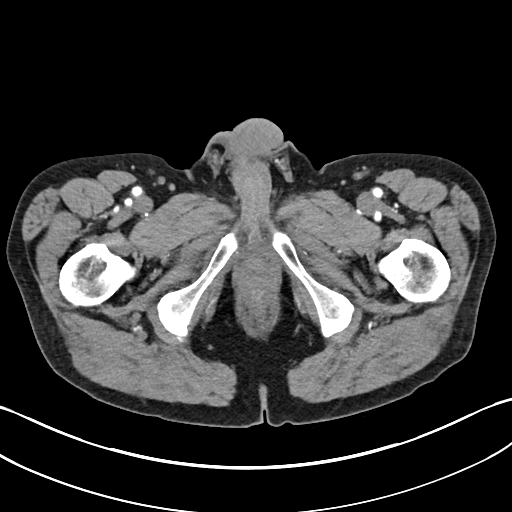
[im 12/136  bone]
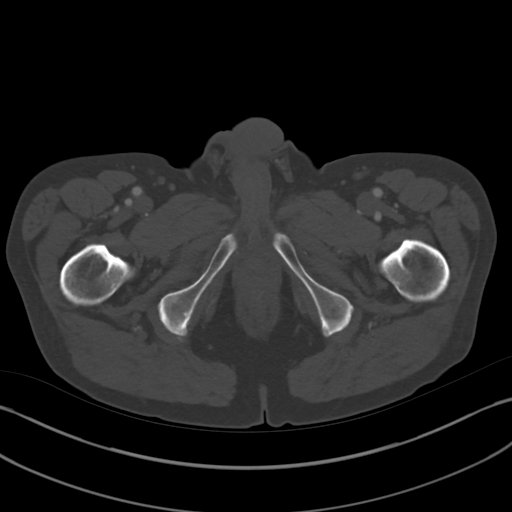
[im 23/136  soft-tissue]
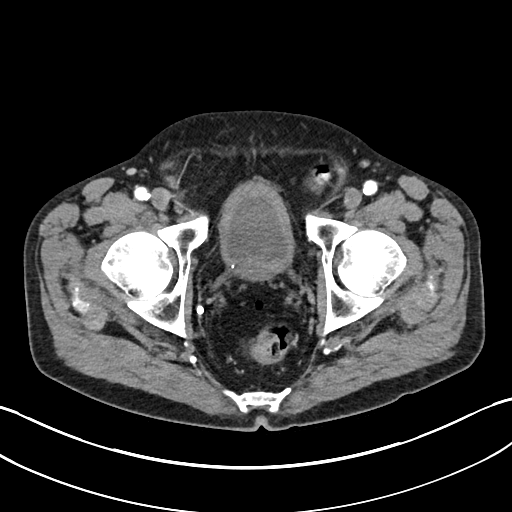
[im 34/136  soft-tissue]
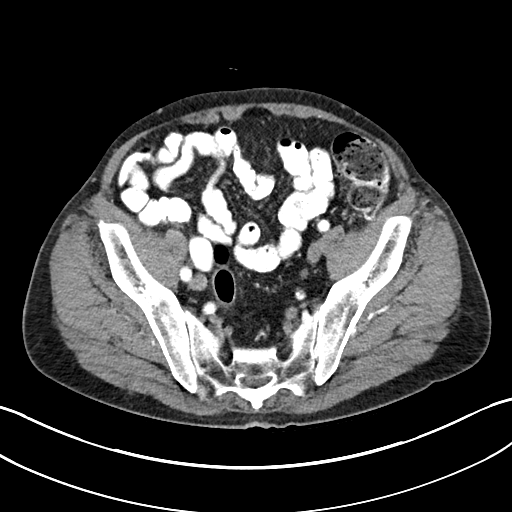
[im 46/136  soft-tissue]
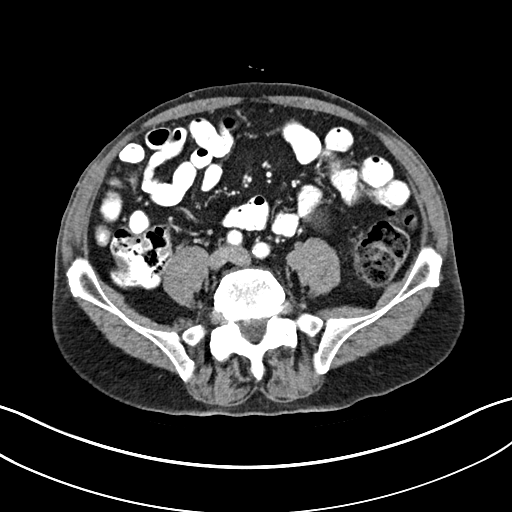
[im 57/136  soft-tissue]
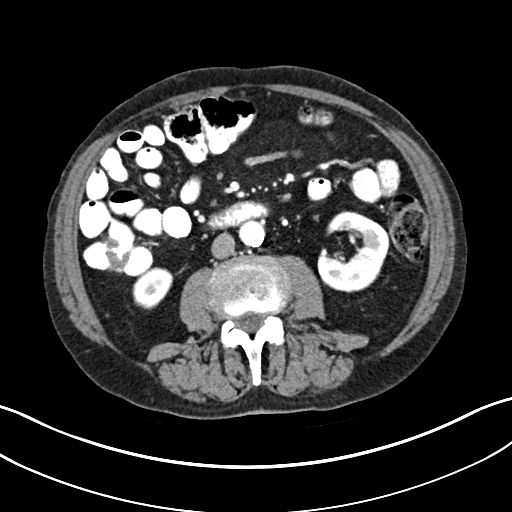
[im 68/136  soft-tissue]
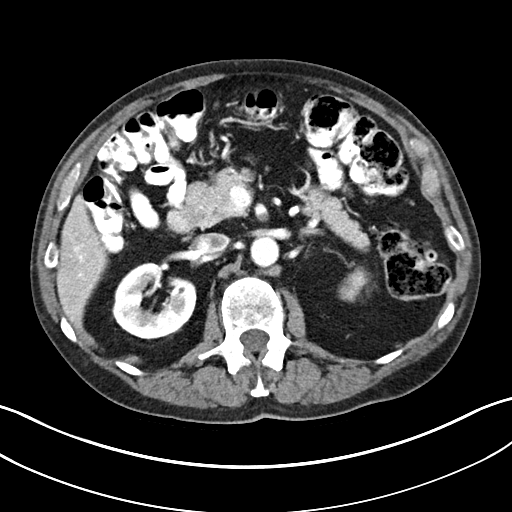
[im 79/136  soft-tissue]
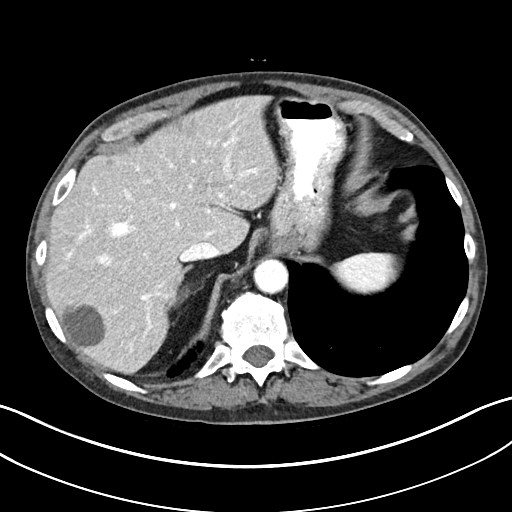
[im 91/136  soft-tissue]
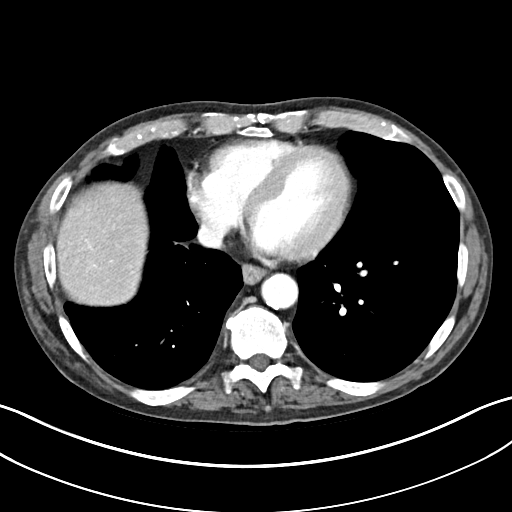
[im 102/136  soft-tissue]
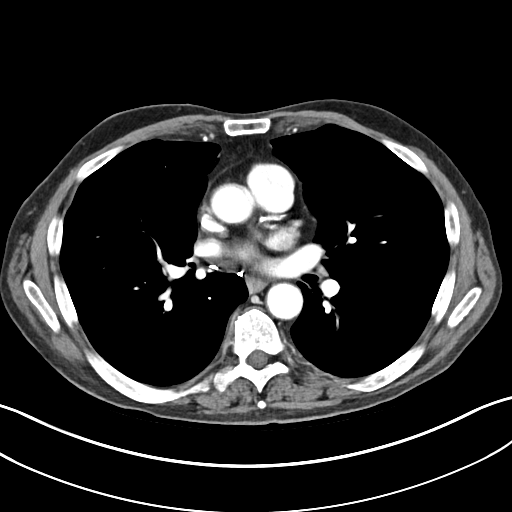
[im 102/136  bone]
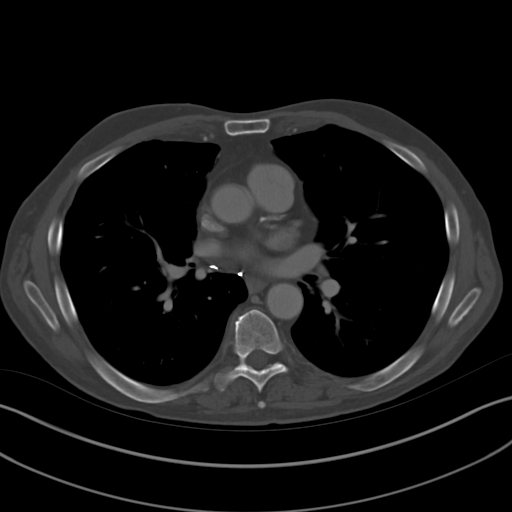
[im 113/136  soft-tissue]
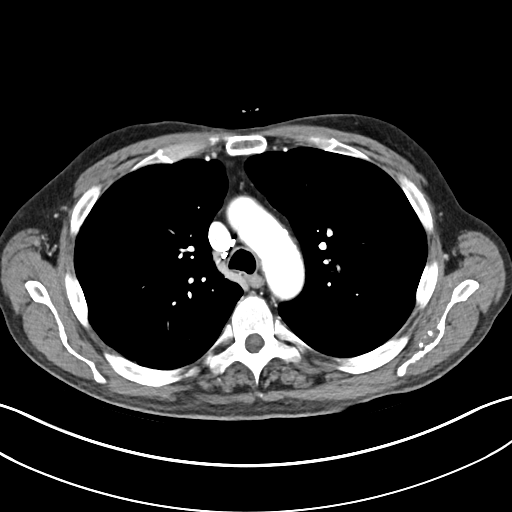
[im 124/136  soft-tissue]
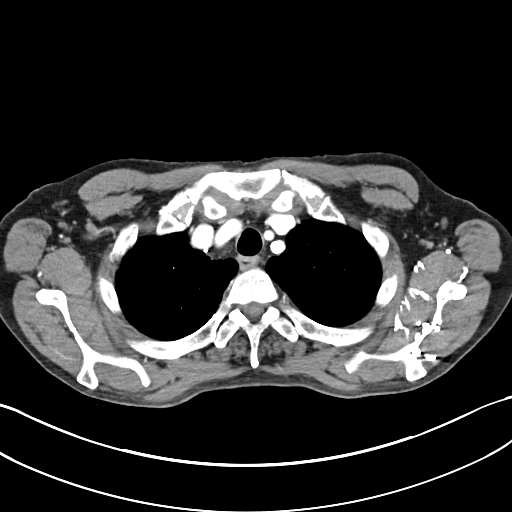

[Series 4: coronals cap 2.00 cor · coronal · 0.74mm/px · 3 of 140 slices shown]
[im 47/140  soft-tissue]
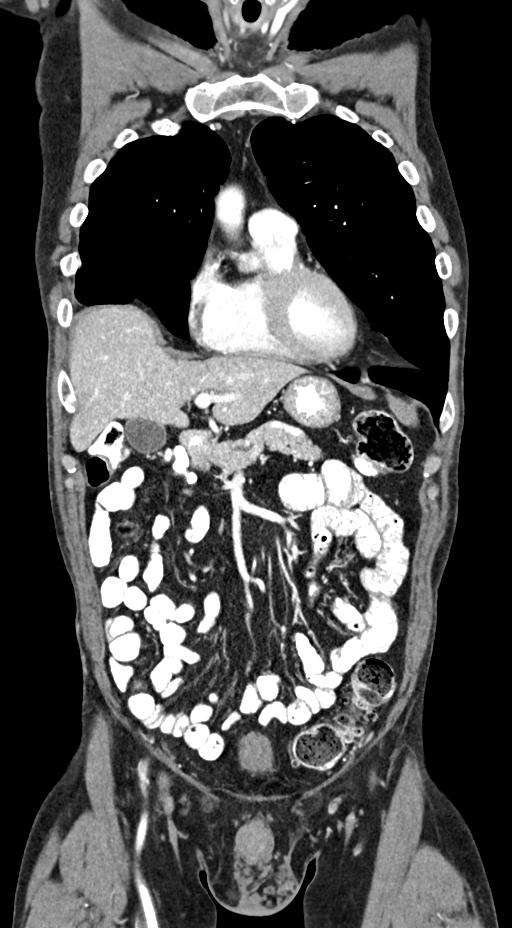
[im 62/140  soft-tissue]
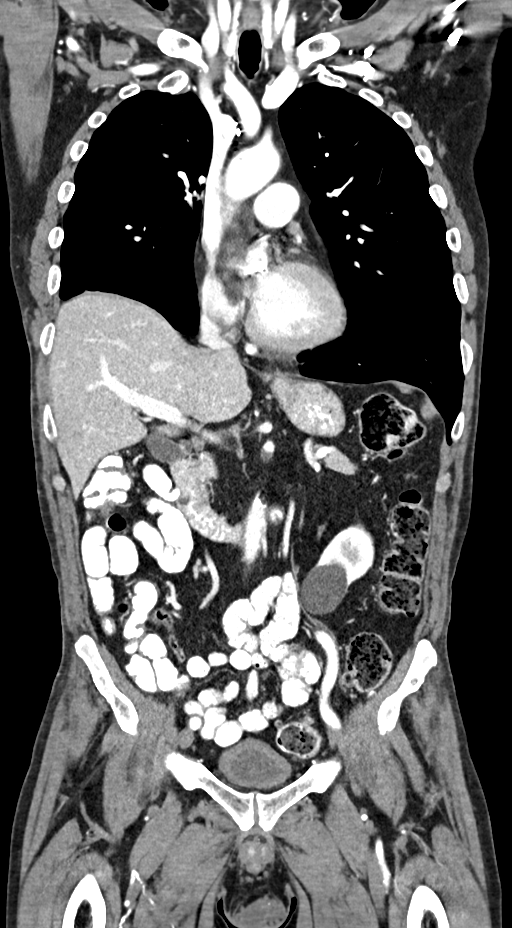
[im 78/140  soft-tissue]
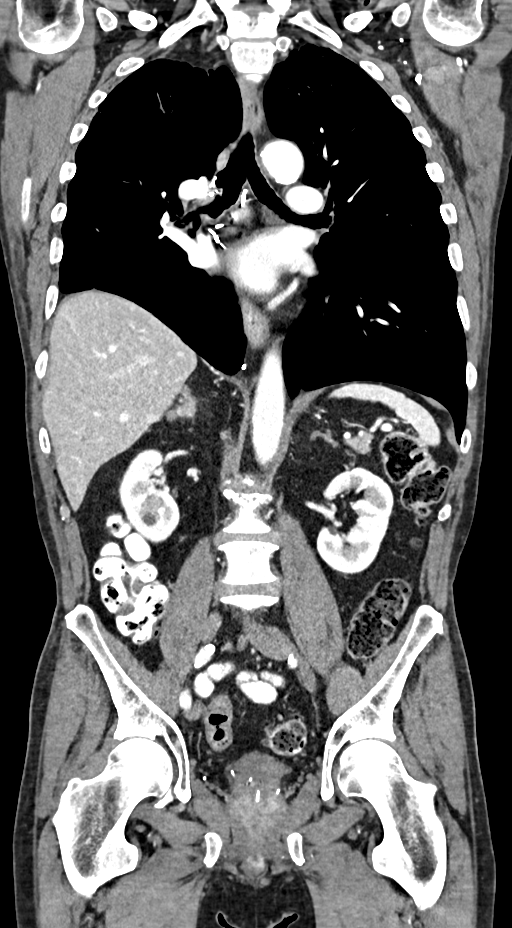

[14 of 46 positions shown; findings below may reference images not displayed]

FINDINGS: CT CHEST FINDINGS

Cardiovascular: Atherosclerotic calcification of the aorta and
coronary arteries. Heart size normal. No pericardial effusion.

Mediastinum/Nodes: 8 mm low-attenuation right thyroid nodule. No
followup recommended (ref: [HOSPITAL]. [DATE]):
143-50).No pathologically enlarged mediastinal, hilar or axillary
lymph nodes. There are surgical clips in the mediastinum. Esophagus
is grossly unremarkable.

Lungs/Pleura: Biapical pleuroparenchymal scarring, right greater
than left. Centrilobular and paraseptal emphysema. Right upper
lobectomy. A few scattered tiny peripheral pulmonary nodules measure
up to 3 mm (3/65). No pleural fluid. Airway is otherwise
unremarkable.

Musculoskeletal: No worrisome lytic or sclerotic lesions.

CT ABDOMEN PELVIS FINDINGS

Hepatobiliary: Low-attenuation lesions in the liver measure up to
4.0 cm in the right hepatic lobe and are indicative of cysts. Liver
and gallbladder are otherwise unremarkable. No biliary ductal
dilatation.

Pancreas: Negative.

Spleen: Negative.

Adrenals/Urinary Tract: Adrenal glands are unremarkable.
Low-attenuation lesion off the lower pole left kidney measures
cm and is likely a cyst. Other low-density lesions in the kidneys
bilaterally are too small to definitively characterize but
statistically, cysts are likely. Ureters are decompressed. Bladder
appears thick-walled but is low in volume.

Stomach/Bowel: Stomach, small bowel, appendix and colon are
unremarkable.

Vascular/Lymphatic: Atherosclerotic calcification of the aorta
without aneurysm. No pathologically enlarged lymph nodes.

Reproductive: Brachytherapy seeds are seen in the prostate.

Other: No free fluid.  Mesenteries and peritoneum are unremarkable.

Musculoskeletal: Degenerative changes in the spine. No worrisome
lytic lesions.
IMPRESSION: 1. No evidence of primary malignancy in the chest, abdomen or
pelvis.
2. Scattered peripheral pulmonary nodules measure 3 mm or less in
size. No follow-up needed if patient is low-risk (and has no known
or suspected primary neoplasm). Non-contrast chest CT can be
considered in 12 months if patient is high-risk. This recommendation
follows the consensus statement: Guidelines for Management of
Incidental Pulmonary Nodules Detected on CT Images: From the
3. Aortic atherosclerosis (1HB1R-CE9.9). Coronary artery
calcification.
4.  Emphysema (1HB1R-34C.E).

## 2020-07-29 ENCOUNTER — Other Ambulatory Visit: Payer: Self-pay | Admitting: Physician Assistant

## 2020-07-29 DIAGNOSIS — Z952 Presence of prosthetic heart valve: Secondary | ICD-10-CM

## 2020-09-17 ENCOUNTER — Ambulatory Visit (INDEPENDENT_AMBULATORY_CARE_PROVIDER_SITE_OTHER): Payer: Medicare Other

## 2020-09-17 ENCOUNTER — Other Ambulatory Visit: Payer: Self-pay

## 2020-09-17 DIAGNOSIS — Z952 Presence of prosthetic heart valve: Secondary | ICD-10-CM

## 2020-09-17 LAB — ECHOCARDIOGRAM COMPLETE
AR max vel: 2.67 cm2
AV Area VTI: 2.84 cm2
AV Area mean vel: 2.64 cm2
AV Mean grad: 10.5 mmHg
AV Peak grad: 21.1 mmHg
Ao pk vel: 2.3 m/s
Area-P 1/2: 1.94 cm2
Calc EF: 60 %
S' Lateral: 2.4 cm
Single Plane A2C EF: 58.3 %
Single Plane A4C EF: 62 %

## 2020-09-24 ENCOUNTER — Ambulatory Visit (INDEPENDENT_AMBULATORY_CARE_PROVIDER_SITE_OTHER): Payer: Medicare Other | Admitting: Cardiovascular Disease

## 2020-09-24 ENCOUNTER — Other Ambulatory Visit: Payer: Self-pay

## 2020-09-24 ENCOUNTER — Encounter: Payer: Self-pay | Admitting: Cardiovascular Disease

## 2020-09-24 VITALS — BP 154/70 | HR 68 | Ht 68.5 in | Wt 170.2 lb

## 2020-09-24 DIAGNOSIS — I779 Disorder of arteries and arterioles, unspecified: Secondary | ICD-10-CM | POA: Diagnosis not present

## 2020-09-24 DIAGNOSIS — I739 Peripheral vascular disease, unspecified: Secondary | ICD-10-CM | POA: Diagnosis not present

## 2020-09-24 DIAGNOSIS — I10 Essential (primary) hypertension: Secondary | ICD-10-CM

## 2020-09-24 DIAGNOSIS — I350 Nonrheumatic aortic (valve) stenosis: Secondary | ICD-10-CM

## 2020-09-24 DIAGNOSIS — R0602 Shortness of breath: Secondary | ICD-10-CM

## 2020-09-24 DIAGNOSIS — Z952 Presence of prosthetic heart valve: Secondary | ICD-10-CM | POA: Diagnosis not present

## 2020-09-24 DIAGNOSIS — E782 Mixed hyperlipidemia: Secondary | ICD-10-CM

## 2020-09-24 MED ORDER — AMLODIPINE BESYLATE 10 MG PO TABS: 10.0000 mg | ORAL_TABLET | Freq: Every day | ORAL | 3 refills | Status: DC

## 2020-09-24 NOTE — Patient Instructions (Addendum)
Medication Instructions:  Please STOP losartan  Please START amlodipine 10 mg daily  Monitor BP daily and keep a log Take BP 2-3 hours after amlodipine May need spironolactone 25 mg daily if pressure are high or losartan 25 mg daily  If you need a refill on your cardiac medications before your next appointment, please call your pharmacy.   Lab work: No new labs needed  Testing/Procedures: No new testing needed  Follow-Up: At [facility], you and your health needs are our priority.  As part of our continuing mission to provide you with exceptional heart care, we have created designated Provider Care Teams.  These Care Teams include your primary Cardiologist (physician) and Advanced Practice Providers (APPs -  Physician Assistants and Nurse Practitioners) who all work together to provide you with the care you need, when you need it.  You will need a follow up appointment in 12 months  Providers on your designated Care Team:   Murray Hodgkins, NP Christell Faith, PA-C Marrianne Mood, PA-C [place], [place]  COVID-19 Vaccine Information can be found at: [email or web address].uk For questions related to vaccine distribution or appointments, please email [email or web address] or call [phone].

## 2020-09-24 NOTE — Progress Notes (Addendum)
Cardiology Office Note  Date:  09/24/2020   ID:  Brett Mcguire, DOB 04-25-1945, MRN [id number]  PCP:  Tonia Ghent, MD   Chief Complaint  Patient presents with   6 month follow up     Patient c/o shortness of breath; feels it's coming from the losartan. Medications reviewed by the patient verbally.     HPI:  Brett Mcguire is a 75 year old gentleman with prior history of  smoking for 17 years,  17 years of secondhand exposure per the patient, lung cancer with resection of the right upper lobe in 1989,  quit smoking in 1984,  chronic mild shortness of breath  who presents for follow-up of his  aortic valve stenosis TAVR  Last seen in the clinic 02/2020 Has some flem, chest congestion, concerned it could be from the losartan, requesting a change  Constipated.  Again wonders if it is from losartan  Pressure at home 130/70 Currently on losartan 100 with spironolactone 12.5  Was previously on amlodipine, had no side effects Prior side effects on Cardura, on his allergy list  Continues to work with dog boarding  EKG personally reviewed by myself on todays visit NSR rate 68 bpm no ST or T wave changes  Echo reviewed with him on today's visit  1. Left ventricular ejection fraction, by estimation, is 60 to 65%. The  left ventricle has normal function. The left ventricle has no regional  wall motion abnormalities. There is moderate left ventricular hypertrophy.  Left ventricular diastolic  parameters were normal.   2. Right ventricular systolic function is normal. The right ventricular  size is normal.   3. Left atrial size was mildly dilated.   4. The mitral valve is normal in structure. No evidence of mitral valve  regurgitation. No evidence of mitral stenosis.   5. Post TAVR 10/10/19 with 26 mm Sapien 3 valve No signficant PVL mean  gradient 10 peak 21 mmHg AVA 2.7 cm2 DVI 0.75 normal function Gradients  actually lower than those obtained on TTE 10/03/19. The aortic valve  has  been repaired/replaced. Aortic valve  regurgitation is not visualized. No aortic stenosis is present. There is a  26 mm Sapien prosthetic (TAVR) valve present in the aortic position.  Procedure Date: 10/10/19.   6. The inferior vena cava is normal in size with greater than 50%  respiratory variability, suggesting right atrial pressure of 3 mmHg.   hospital 10/28-10/29/21 for TIA.  Echo showed EF 60%, normally functioning TAVR with a mean gradient of 9 mmHg and no PVL. Bubble study was also performed which was negative.  Reported compliance with his aspirin Plavix at the time  Prior records reviewed Echo in 12/2014 Possibly bicuspid; moderately thickened, mildly calcified leaflets. There was mild to moderate stenosis. There was trivial regurgitation. Peak velocity 311, Peak gradient (S): 39 mm Hg.  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  PSH:    Past Surgical History:  Procedure Laterality Date   COLONOSCOPY  02/18/2000   Polyp, diverticulosis, repeat in 5 years   COLONOSCOPY  03/03/2006   Repeat  -  Divertics   COLONOSCOPY WITH PROPOFOL N/A 12/22/2017   Procedure: COLONOSCOPY WITH PROPOFOL;  Surgeon: Virgel Manifold, MD;  Location: [facility] END[facility];  Service: Endoscopy;  Laterality: N/A;   CYSTOSCOPY WITH INSERTION OF UROLIFT     Laminectomy/Discectomy  11/03/2005   L5/S1   Malignant melanoma excision  06/20/2003   Dr. Nehemiah Massed   MRI  10/09/2005   Lumbar spine, bulging disc L5/S1   Right  lobectomy  1989   Lung cancer   RIGHT/LEFT HEART CATH AND CORONARY ANGIOGRAPHY Bilateral 09/06/2019   Procedure: RIGHT/LEFT HEART CATH AND CORONARY ANGIOGRAPHY;  Surgeon: Minna Merritts, MD;  Location: [facility];  Service: Cardiovascular;  Laterality: Bilateral;   TEE WITHOUT CARDIOVERSION N/A 10/02/2019   Procedure: TRANSESOPHAGEAL ECHOCARDIOGRAM (TEE);  Surgeon: Sherren Mocha, MD;  Location: [facility];  Service: Open Heart Surgery;  Laterality: N/A;   TONSILLECTOMY AND ADENOIDECTOMY     TRANSCATHETER AORTIC VALVE REPLACEMENT, TRANSFEMORAL  10/02/2019   Transcatheter Aortic Valve Replacement - Percutaneous Left Transfemoral Approach   TRANSCATHETER AORTIC VALVE REPLACEMENT, TRANSFEMORAL N/A 10/02/2019   Procedure: TRANSCATHETER AORTIC VALVE REPLACEMENT, TRANSFEMORAL USING EDWARDS SAPIEN 3 ULTRA 26 MM THV.;  Surgeon: Sherren Mocha, MD;  Location: [facility];  Service: Open Heart Surgery;  Laterality: N/A;    Current Outpatient Medications  Medication Sig Dispense Refill   aspirin 81 MG chewable tablet Chew 1 tablet (81 mg total) by mouth daily.     atorvastatin (LIPITOR) 40 MG tablet Take 1 tablet (40 mg total) by mouth daily. 90 tablet 3   clopidogrel (PLAVIX) 75 MG tablet TAKE 1 TABLET BY MOUTH DAILY WITH BREAKFAST 90 tablet 1   folic acid (FOLVITE) 1 MG tablet Take 1 mg by mouth See admin instructions. Take 1 tablet (1 mg) by mouth on 6 days in the week, do NOT take on Mondays.     losartan (COZAAR) 100 MG tablet Take 1 tablet (100 mg total) by mouth daily. 90 tablet 3   methotrexate 2.5 MG tablet Take 10 mg by mouth every Monday.      Polyethyl Glycol-Propyl Glycol (SYSTANE ULTRA) 0.4-0.3 % SOLN Place 1 drop into both eyes daily as needed (dry/irritated eyes.).      Skin Protectants, Misc. (EUCERIN) cream Apply 1 application topically daily as needed for dry skin.     spironolactone (ALDACTONE) 25 MG tablet TAKE 0.5 TABLET BY MOUTH DAILY. 45 tablet 2   amoxicillin (AMOXIL) 500 MG  capsule Take 2,000 mg by mouth as directed. Take 2,000 mg one hour prior to all dental visits. (Patient not taking: No sig reported)     No current facility-administered medications for this visit.    Allergies:   Ephedrine, Finasteride, Sulfa antibiotics, Sulfonamide derivatives, Tadalafil, Alfuzosin, Doxazosin, Flomax [tamsulosin hcl], and Rapaflo [silodosin]   Social History:  The patient  reports that he quit smoking about 38 years ago. His smoking use included cigarettes. He has a 34.00 pack-year smoking history. He has never used smokeless tobacco. He reports that he does not drink alcohol and does not use drugs.   Family History:   family history includes Alcohol abuse in his mother; Cancer in his father, mother, and another family member; Prostate cancer in his father.    Review of Systems: Review of Systems  Constitutional: Negative.   HENT: Negative.    Respiratory: Negative.    Cardiovascular: Negative.   Gastrointestinal: Negative.   Musculoskeletal: Negative.   Neurological: Negative.   Psychiatric/Behavioral: Negative.    All other systems  reviewed and are negative.  PHYSICAL EXAM: VS:  BP (!) 154/70 (BP Location: Left Arm, Patient Position: Sitting, Cuff Size: Normal)   Pulse 68   Ht 5' 8.5" (1.74 m)   Wt 170 lb 4 oz (77.2 kg)   SpO2 97%   BMI 25.51 kg/m  , BMI Body mass index is 25.51 kg/m. Constitutional:  oriented to person, place, and time. No distress.  HENT:  Head: Grossly normal Eyes:  no discharge. No scleral icterus.  Neck: No JVD, no carotid bruits  Cardiovascular: Regular rate and rhythm, 7-0/1 systolic ejection murmur right sternal border  Pulmonary/Chest: Clear to auscultation bilaterally, no wheezes or rails Abdominal: Soft.  no distension.  no tenderness.  Musculoskeletal: Normal range of motion Neurological:  normal muscle tone. Coordination normal. No atrophy Skin: Skin warm and dry Psychiatric: normal affect, pleasant  Recent  Labs: 09/28/2019: B Natriuretic Peptide 64.8 11/08/2019: ALT 19; TSH 1.626 11/09/2019: Magnesium 2.3 12/03/2019: Hemoglobin 12.7; Platelets 282.0 12/14/2019: BUN 20; Potassium 4.8; Sodium 134 05/12/2020: Creatinine, Ser 1.00    Lipid Panel Lab Results  Component Value Date   CHOL 152 11/08/2019   HDL 48 11/08/2019   LDLCALC NOT CALCULATED 11/08/2019   TRIG 117 11/08/2019      Wt Readings from Last 3 Encounters:  09/24/20 170 lb 4 oz (77.2 kg)  03/18/20 173 lb 2 oz (78.5 kg)  12/11/19 167 lb (75.8 kg)     ASSESSMENT AND PLAN:  Aortic valve disease -  Severe stenosis, s/p TAVR Echocardiogram performed recently reviewed with him On aspirin Plavix Recommend he hold the Plavix Has NYHA class II symptoms. KCCQ added to note ( KT-9/15)  TIA on aspirin Plavix Can drop the plavix  Essential hypertension - Plan: EKG 12-Lead Losartan off given his concern for chest phlegm Will start amlodipine 10 mg daily Spiro 12.5 daily If blood pressure runs high could potentially increase spironolactone to 25 or add losartan 25  Chronic fatigue Managed by primary care  Mixed hyperlipidemia - Plan: EKG 12-Lead Tolerating Lipitor 40 Cholesterol at goal  Carotid arterial stenosis Mild bilateral disease on study in 2017, less than 39%, again in 2021  Shortness of breath Long history of smoking, prior lung cancer Improved symptoms following TAVR Active at baseline    Total encounter time more than 25 minutes  Greater than 50% was spent in counseling and coordination of care with the patient   No orders of the defined types were placed in this encounter.    Signed, Esmond Plants, M.D., Ph.D. 09/24/2020  [place], [place]

## 2020-09-29 ENCOUNTER — Telehealth: Payer: Self-pay

## 2020-09-29 ENCOUNTER — Telehealth: Payer: Self-pay | Admitting: Cardiovascular Disease

## 2020-09-29 ENCOUNTER — Other Ambulatory Visit: Payer: Self-pay | Admitting: Physician Assistant

## 2020-09-29 DIAGNOSIS — Z952 Presence of prosthetic heart valve: Secondary | ICD-10-CM

## 2020-09-29 DIAGNOSIS — Z79899 Other long term (current) drug therapy: Secondary | ICD-10-CM

## 2020-09-29 DIAGNOSIS — I10 Essential (primary) hypertension: Secondary | ICD-10-CM

## 2020-09-29 NOTE — Telephone Encounter (Signed)
Pt ready to schedule colonoscopy

## 2020-09-29 NOTE — Telephone Encounter (Signed)
Please call to discuss Plavix. He did not see it on his medication list and asks if he still needs to be taking it.

## 2020-09-29 NOTE — Telephone Encounter (Signed)
Was able to reach back out to Mr. Martenson regarding Plavix. Per last OV notes from 9/14  Severe stenosis, s/p TAVR Echocardiogram performed recently reviewed with him On aspirin Plavix Recommend he hold the Plavix Has NYHA class II symptoms. KCCQ added to note ( KT-9/15)  TIA on aspirin Plavix Can drop the plavix  Advised this was removed from his med list at last OV, pt reports he was aware, just wanted to make sure as pharmacy sent in for a refill. Suggest calling [facility] and asked them to remove Plavix from the automatic refill pool. Pt verbalized understanding, has stopped the Plavix and takes ASA 81 mg daily.   Otherwise all questions or concerns were address and no additional concerns at this time. Agreeable to plan, will call back for anything further.

## 2020-10-01 NOTE — Telephone Encounter (Signed)
Patient would like to wait until the 1st of the year to schedule procedure. Instructed patient to call back the beginning of December to schedule.

## 2020-10-22 DIAGNOSIS — Z85828 Personal history of other malignant neoplasm of skin: Secondary | ICD-10-CM | POA: Diagnosis not present

## 2020-10-22 DIAGNOSIS — Z79899 Other long term (current) drug therapy: Secondary | ICD-10-CM | POA: Diagnosis not present

## 2020-10-22 DIAGNOSIS — X32XXXA Exposure to sunlight, initial encounter: Secondary | ICD-10-CM | POA: Diagnosis not present

## 2020-10-22 DIAGNOSIS — L570 Actinic keratosis: Secondary | ICD-10-CM | POA: Diagnosis not present

## 2020-10-22 DIAGNOSIS — Z85820 Personal history of malignant melanoma of skin: Secondary | ICD-10-CM | POA: Diagnosis not present

## 2020-10-22 DIAGNOSIS — L2084 Intrinsic (allergic) eczema: Secondary | ICD-10-CM | POA: Diagnosis not present

## 2020-10-22 DIAGNOSIS — D2261 Melanocytic nevi of right upper limb, including shoulder: Secondary | ICD-10-CM | POA: Diagnosis not present

## 2020-10-22 DIAGNOSIS — D2271 Melanocytic nevi of right lower limb, including hip: Secondary | ICD-10-CM | POA: Diagnosis not present

## 2020-10-28 DIAGNOSIS — N401 Enlarged prostate with lower urinary tract symptoms: Secondary | ICD-10-CM | POA: Diagnosis not present

## 2020-10-28 DIAGNOSIS — R9721 Rising PSA following treatment for malignant neoplasm of prostate: Secondary | ICD-10-CM | POA: Diagnosis not present

## 2020-10-28 DIAGNOSIS — D400 Neoplasm of uncertain behavior of prostate: Secondary | ICD-10-CM | POA: Diagnosis not present

## 2020-10-28 DIAGNOSIS — R3121 Asymptomatic microscopic hematuria: Secondary | ICD-10-CM | POA: Diagnosis not present

## 2020-11-15 ENCOUNTER — Other Ambulatory Visit: Payer: Self-pay | Admitting: Physician Assistant

## 2020-11-17 NOTE — Telephone Encounter (Signed)
Please advise if ok to refill last filled By Angelena Form, PA.

## 2020-11-18 DIAGNOSIS — N401 Enlarged prostate with lower urinary tract symptoms: Secondary | ICD-10-CM | POA: Diagnosis not present

## 2020-11-18 DIAGNOSIS — R972 Elevated prostate specific antigen [PSA]: Secondary | ICD-10-CM | POA: Diagnosis not present

## 2020-11-18 DIAGNOSIS — Z79899 Other long term (current) drug therapy: Secondary | ICD-10-CM | POA: Diagnosis not present

## 2020-12-02 ENCOUNTER — Other Ambulatory Visit: Payer: Medicare Other

## 2020-12-11 ENCOUNTER — Encounter: Payer: Medicare Other | Admitting: Family Medicine

## 2020-12-24 ENCOUNTER — Other Ambulatory Visit: Payer: Self-pay

## 2020-12-24 DIAGNOSIS — Z86010 Personal history of colonic polyps: Secondary | ICD-10-CM

## 2020-12-24 MED ORDER — NA SULFATE-K SULFATE-MG SULF 17.5-3.13-1.6 GM/177ML PO SOLN
1.0000 | Freq: Once | ORAL | 0 refills | Status: AC
Start: 2020-12-24 — End: 2020-12-24

## 2020-12-24 NOTE — Progress Notes (Signed)
Gastroenterology Pre-Procedure Review  Request Date: 01/22/2021 Requesting Physician: Dr. Vicente Males  PATIENT REVIEW QUESTIONS: The patient responded to the following health history questions as indicated:  3 year Recall  1. Are you having any GI issues? no 2. Do you have a personal history of Polyps? yes (12/2017 polyps removed.) 3. Do you have a family history of Colon Cancer or Polyps? no 4. Diabetes Mellitus? no 5. Joint replacements in the past 12 months?no 6. Major health problems in the past 3 months?no 7. Any artificial heart valves, MVP, or defibrillator?yes (TAVR  10/02/2019)    MEDICATIONS & ALLERGIES:    Patient reports the following regarding taking any anticoagulation/antiplatelet therapy:   Plavix, Coumadin, Eliquis, Xarelto, Lovenox, Pradaxa, Brilinta, or Effient? no Aspirin? yes (81 mg)  Patient confirms/reports the following medications:  Current Outpatient Medications  Medication Sig Dispense Refill   amLODipine (NORVASC) 10 MG tablet Take 1 tablet (10 mg total) by mouth daily. 90 tablet 3   amoxicillin (AMOXIL) 500 MG capsule Take 2,000 mg by mouth as directed. Take 2,000 mg one hour prior to all dental visits. (Patient not taking: No sig reported)     aspirin 81 MG chewable tablet Chew 1 tablet (81 mg total) by mouth daily.     atorvastatin (LIPITOR) 40 MG tablet TAKE 1 TABLET BY MOUTH DAILY 90 tablet 3   folic acid (FOLVITE) 1 MG tablet Take 1 mg by mouth See admin instructions. Take 1 tablet (1 mg) by mouth on 6 days in the week, do NOT take on Mondays.     methotrexate 2.5 MG tablet Take 10 mg by mouth every Monday.      Polyethyl Glycol-Propyl Glycol (SYSTANE ULTRA) 0.4-0.3 % SOLN Place 1 drop into both eyes daily as needed (dry/irritated eyes.).      Skin Protectants, Misc. (EUCERIN) cream Apply 1 application topically daily as needed for dry skin.     spironolactone (ALDACTONE) 25 MG tablet TAKE 1/2 TABLET EVERYDAY 45 tablet 3   No current facility-administered  medications for this visit.    Patient confirms/reports the following allergies:  Allergies  Allergen Reactions   Ephedrine Other (See Comments)    BP spikes  Other reaction(s): Other (See Comments) BP spikes   Finasteride Diarrhea    diarrhea   Sulfa Antibiotics     Other reaction(s): Other (See Comments) REACTION: as child   Sulfonamide Derivatives Other (See Comments)    REACTION: as child   Tadalafil Other (See Comments)    heartburn Other reaction(s): Other (See Comments) heartburn   Alfuzosin Rash and Other (See Comments)    Skin rash, burning sensation of skin Other reaction(s): Other (See Comments) Skin rash, burning sensation of skin   Doxazosin Rash and Other (See Comments)    Skin rash, burning sensation of skin Other reaction(s): Other (See Comments) Skin rash, burning sensation of skin   Flomax [Tamsulosin Hcl] Rash    Rash   Rapaflo [Silodosin] Rash    rash    No orders of the defined types were placed in this encounter.   AUTHORIZATION INFORMATION Primary Insurance: 1D#: Group #:  Secondary Insurance: 1D#: Group #:  SCHEDULE INFORMATION: Date: 01/22/2021 Time: Location: [facility]

## 2021-01-14 ENCOUNTER — Other Ambulatory Visit: Payer: Self-pay | Admitting: Family Medicine

## 2021-01-14 DIAGNOSIS — I10 Essential (primary) hypertension: Secondary | ICD-10-CM

## 2021-01-15 ENCOUNTER — Telehealth: Payer: Self-pay | Admitting: Cardiovascular Disease

## 2021-01-15 IMAGING — CT CT ANGIO CHEST
2 of 7 series · 15 of 36 positions shown · IV contrast (APPLIED)
Comparison: CT of the chest, abdomen and pelvis 02/21/2019.

CLINICAL DATA: 73-year-old male with history of severe aortic
stenosis. Preprocedural study prior to potential transcatheter
aortic valve replacement (TAVR) procedure.

EXAM:
CT ANGIOGRAPHY CHEST, ABDOMEN AND PELVIS
TECHNIQUE: Non-contrast CT of the chest was initially obtained.

[Series 1: ax thins · axial · 0.59mm/px · z∈[+742,+1372]mm · 14 of 710 slices shown]
[im 40/710  lung]
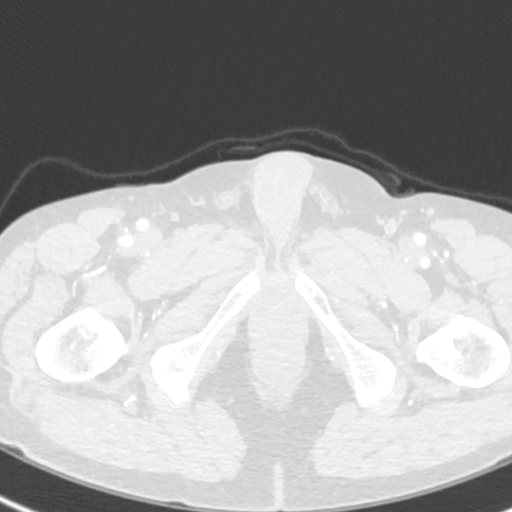
[im 79/710  mediastinal]
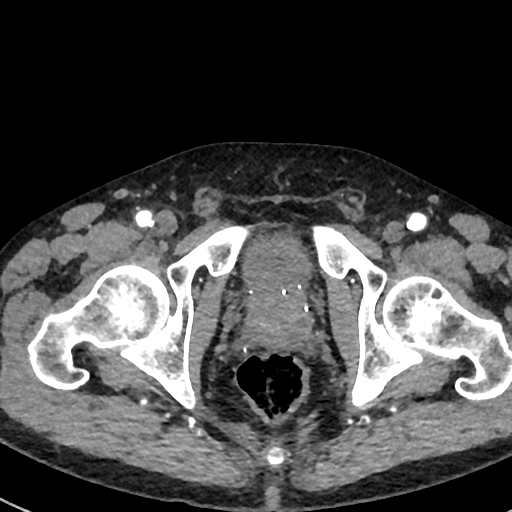
[im 158/710  lung]
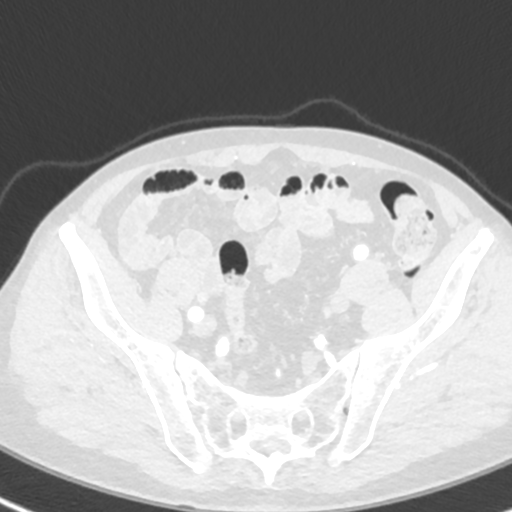
[im 197/710  mediastinal]
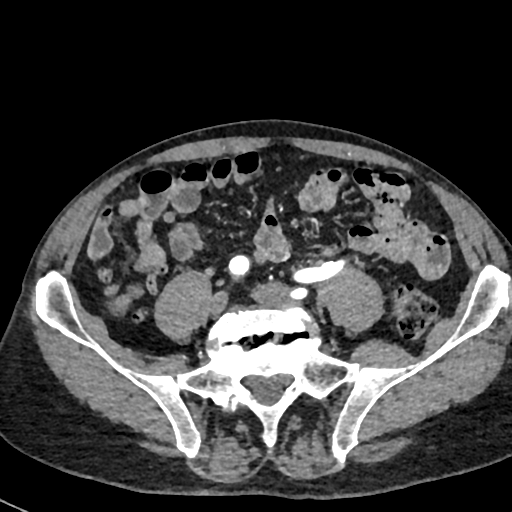
[im 237/710  lung]
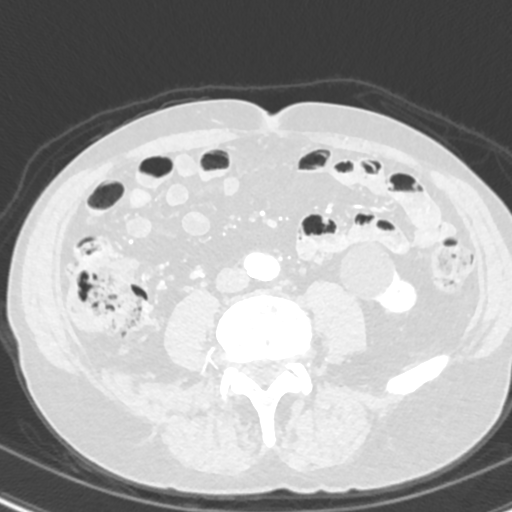
[im 276/710  mediastinal]
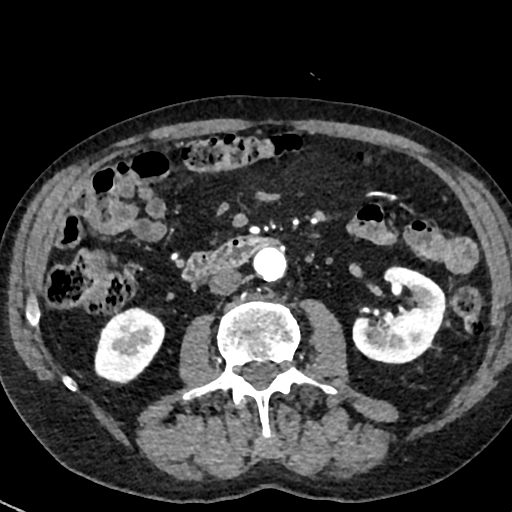
[im 316/710  lung]
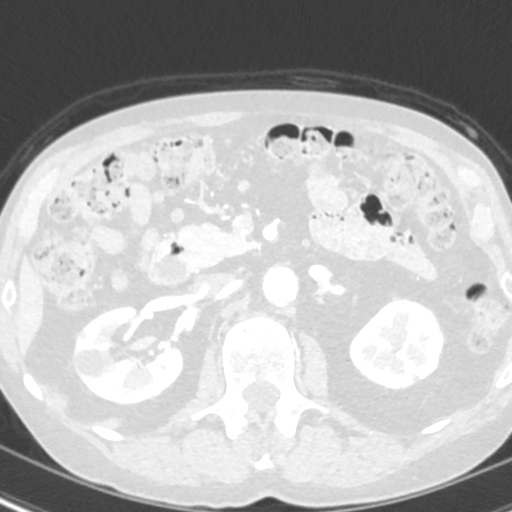
[im 394/710  mediastinal]
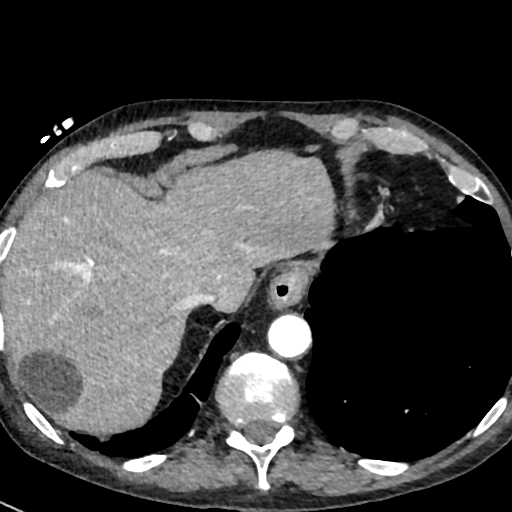
[im 434/710  lung]
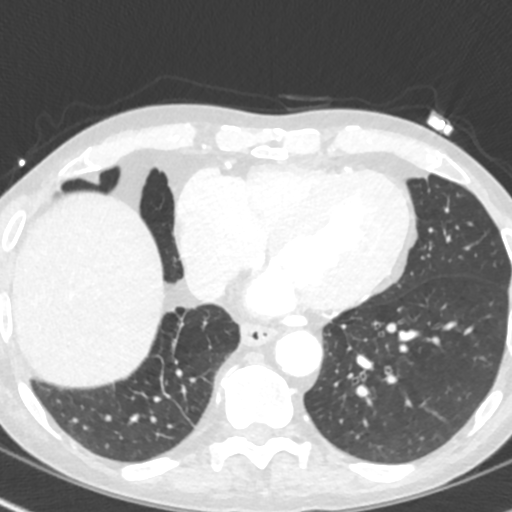
[im 473/710  mediastinal]
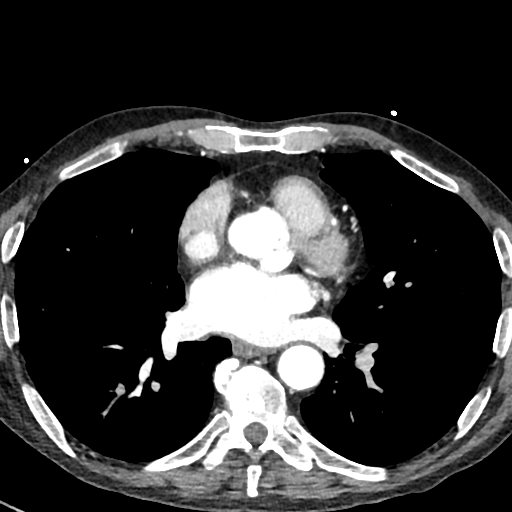
[im 513/710  lung]
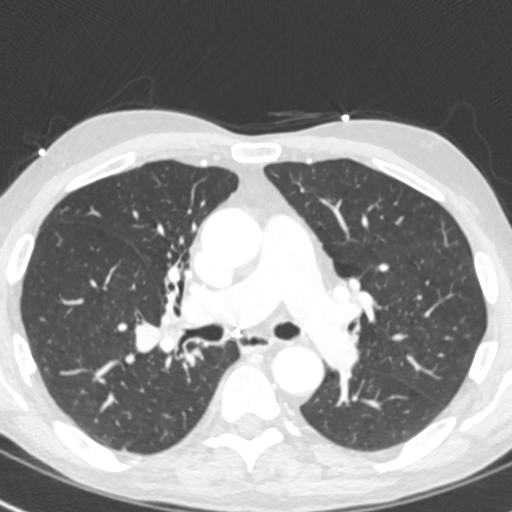
[im 552/710  mediastinal]
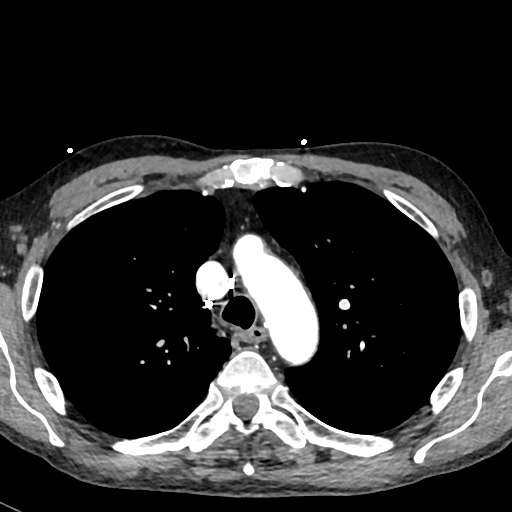
[im 631/710  lung]
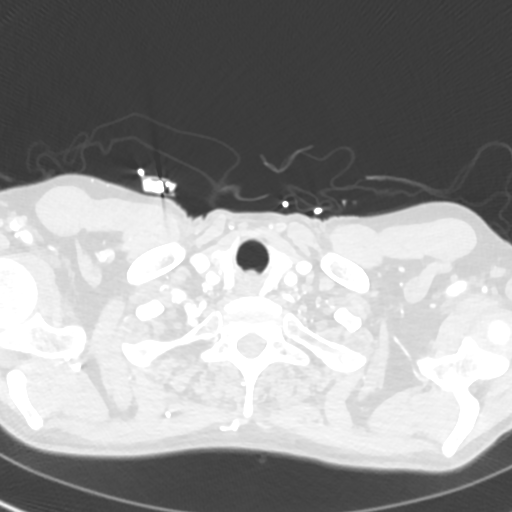
[im 670/710  mediastinal]
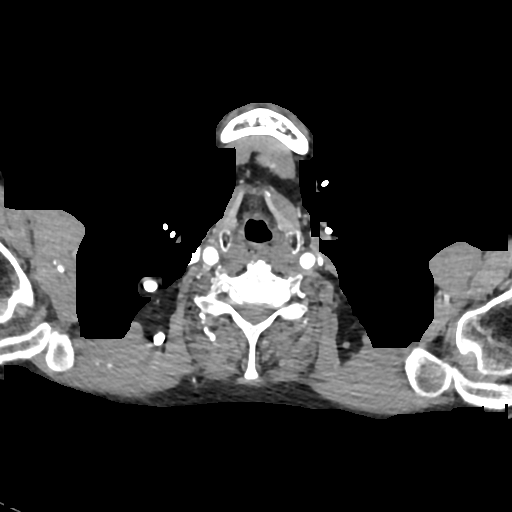

[Series 6: cor · coronal · 0.75mm/px · 1 of 133 slices shown]
[im 67/133  mediastinal]
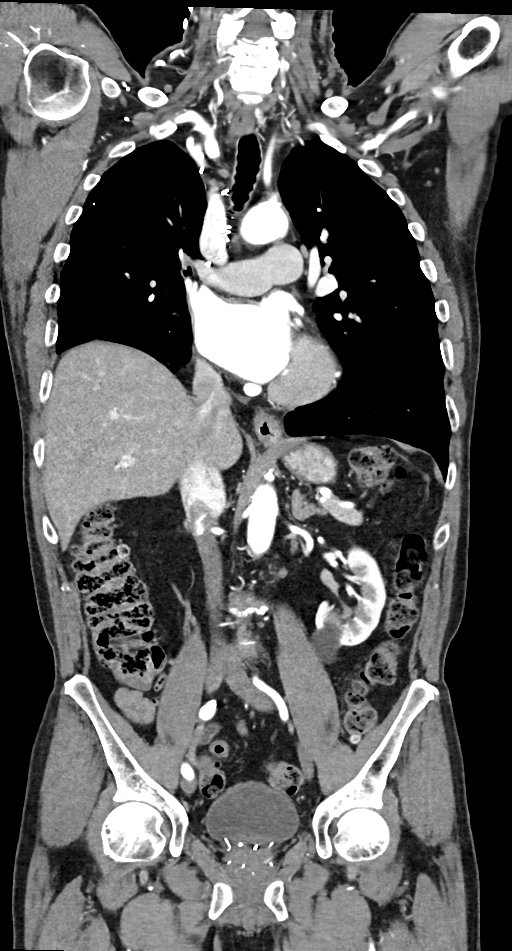

[15 of 36 positions shown; findings below may reference images not displayed]

Multidetector CT imaging through the chest, abdomen and pelvis was
performed using the standard protocol during bolus administration of
intravenous contrast. Multiplanar reconstructed images and MIPs were
obtained and reviewed to evaluate the vascular anatomy.

CONTRAST:  100mL OMNIPAQUE IOHEXOL 350 MG/ML SOLN
FINDINGS: CTA CHEST FINDINGS

Cardiovascular: Heart size is borderline enlarged with severe
concentric left ventricular hypertrophy. There is no significant
pericardial fluid, thickening or pericardial calcification. There is
aortic atherosclerosis, as well as atherosclerosis of the great
vessels of the mediastinum and the coronary arteries, including
calcified atherosclerotic plaque in the left main, left anterior
descending, left circumflex and right coronary arteries. Severe
thickening calcification of the aortic valve.

Mediastinum/Lymph Nodes: No pathologically enlarged mediastinal or
hilar lymph nodes. There are no aggressive appearing lytic or
blastic lesions noted in the visualized portions of the skeleton. No
axillary lymphadenopathy.

Lungs/Pleura: Status post right upper lobectomy with compensatory
hyperexpansion of the right middle and lower lobes. Mild scattered
scarring in the right lung, most evident basal segments of the right
lower lobe. No acute consolidative airspace disease. No pleural
effusions. A few scattered tiny pulmonary nodules are noted in the
lungs bilaterally measuring 3 mm or less in size, similar to prior
examinations. Diffuse bronchial wall thickening with mild
centrilobular and paraseptal emphysema

Musculoskeletal/Soft Tissues: There are no aggressive appearing
lytic or blastic lesions noted in the visualized portions of the
skeleton.

CTA ABDOMEN AND PELVIS FINDINGS

Hepatobiliary: Well-defined 3.9 x 3.2 cm low-attenuation lesion in
segment 7 of the liver, compatible with a simple cyst. No suspicious
hepatic lesions. No intra or extrahepatic biliary ductal dilatation.
Gallbladder is normal in appearance.

Pancreas: No pancreatic mass. No pancreatic ductal dilatation. No
pancreatic or peripancreatic fluid collections or inflammatory
changes.

Spleen: Unremarkable.

Adrenals/Urinary Tract: Low-attenuation lesions in both kidneys
compatible with simple cysts, largest of which is exophytic in the
lower pole of the left kidney measuring 3.3 cm in diameter. Other
subcentimeter low-attenuation lesions in both kidneys, too small to
definitively characterize, but statistically likely to represent
tiny cysts. Bilateral adrenal glands are normal in appearance. No
hydroureteronephrosis. Urinary bladder is unremarkable in
appearance.

Stomach/Bowel: Normal appearance of the stomach. No pathologic
dilatation of small bowel or colon. Numerous colonic diverticulae
are noted, particularly in the descending colon and sigmoid colon,
without surrounding inflammatory changes to suggest an acute
diverticulitis at this time. Normal appendix.

Vascular/Lymphatic: Aortic atherosclerosis, without evidence of
aneurysm or dissection in the abdominal or pelvic vasculature.
Vascular findings and measurements pertinent to potential TAVR
procedure, as detailed below. No lymphadenopathy noted in the
abdomen or pelvis.

Reproductive: Brachytherapy implants within the prostate gland.
Seminal vesicles are unremarkable in appearance.

Other: No significant volume of ascites.  No pneumoperitoneum.

Musculoskeletal: There are no aggressive appearing lytic or blastic
lesions noted in the visualized portions of the skeleton.

VASCULAR MEASUREMENTS PERTINENT TO TAVR:

AORTA:

Minimal Aortic 6iameter-DF x 14 mm

Severity of Aortic Calcification-moderate

RIGHT PELVIS:

Right Common Iliac Artery -

Minimal Tiameter-M.L x 9.6 mm

Tortuosity-mild-to-moderate

Calcification-mild-to-moderate

Right External Iliac Artery -

Minimal Uiameter-9.Z x 7.9 mm

Tortuosity-severe

Calcification-mild

Right Common Femoral Artery -

Minimal Jiameter-H.H x 8.8 mm

Tortuosity-mild

Calcification-mild

LEFT PELVIS:

Left Common Iliac Artery -

Minimal 9iameter-H.H x 8.5 mm

Tortuosity-mild

Calcification-mild

Left External Iliac Artery -

Minimal Aiameter-E.P x 6.8 mm

Tortuosity-moderate to severe

Calcification-mild

Left Common Femoral Artery -

Minimal Fiameter-F.N x 7.4 mm

Tortuosity-mild

Calcification-moderate

Review of the MIP images confirms the above findings.
IMPRESSION: 1. Vascular findings and measurements pertinent to potential TAVR
procedure, as detailed above.
2. Severe thickening calcification of the aortic valve, compatible
with the reported clinical history of severe aortic stenosis.
3. Aortic atherosclerosis, in addition to left main and 3 vessel
coronary artery disease.
4. Tiny pulmonary nodules measuring 3 mm or less in size, stable
compared to the prior study from February 2019, strongly favored to
be benign. No follow-up needed if patient is low-risk (and has no
known or suspected primary neoplasm). Non-contrast chest CT can be
considered in 12 months if patient is high-risk. This recommendation
follows the consensus statement: Guidelines for Management of
Incidental Pulmonary Nodules Detected on CT Images: From the
5. Colonic diverticulosis without evidence of acute diverticulitis
at this time.
6. Additional incidental findings, as above.

## 2021-01-15 IMAGING — CT CT HEART MORP W/ CTA COR W/ SCORE W/ CA W/CM &/OR W/O CM
2 of 7 series · 11 of 20 positions shown, 13 images · IV contrast (APPLIED)
Comparison: None.
COMPARISON: None.

Addendum:
EXAM:
OVER-READ INTERPRETATION  CT CHEST

The following report is an over-read performed by radiologist Dr.
Lai Fa Foo [[email or web address]] on 09/18/2019. This
over-read does not include interpretation of cardiac or coronary
anatomy or pathology. The coronary calcium score/coronary CTA
interpretation by the cardiologist is attached.
CLINICAL DATA: Aortic stenosis
Cardiac TAVR CT
TECHNIQUE: The patient was scanned on a Siemens Force [[id number]]ice scanner. A 120
kV retrospective scan was triggered in the descending thoracic aorta
at 111 HU's. Gantry rotation speed was 270 msecs and collimation was
.9 mm. No beta blockade or nitro were given. The 3D data set was
reconstructed in 5% intervals of the R-R cycle. Systolic and
diastolic phases were analyzed on a dedicated work station using
MPR, MIP and VRT modes. The patient received 80 cc of contrast.

[Series 8: 0-90% · axial · 0.39mm/px · z∈[+1118,+1269]mm · 5 of 3770 slices shown]
[im 629/3770  vessel]
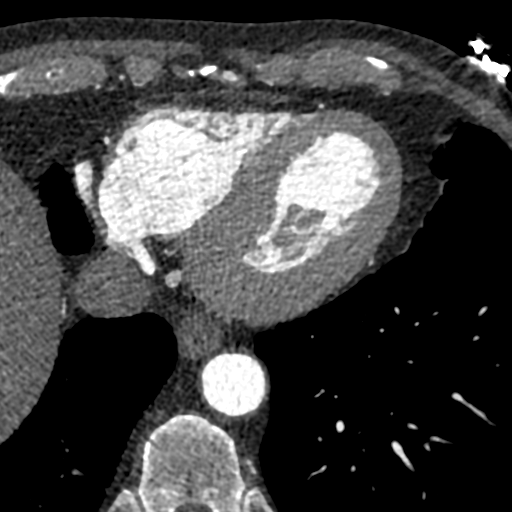
[im 1257/3770  vessel]
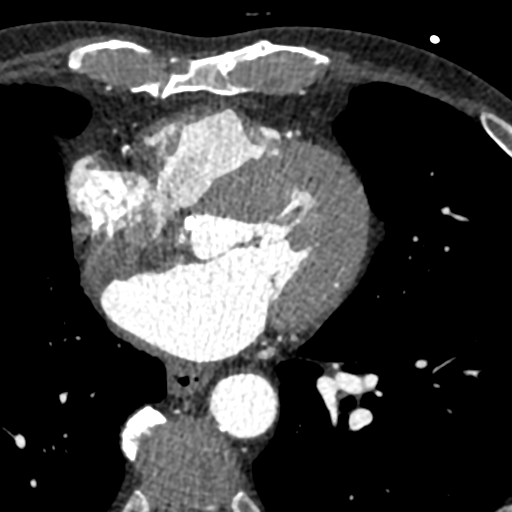
[im 1885/3770  vessel]
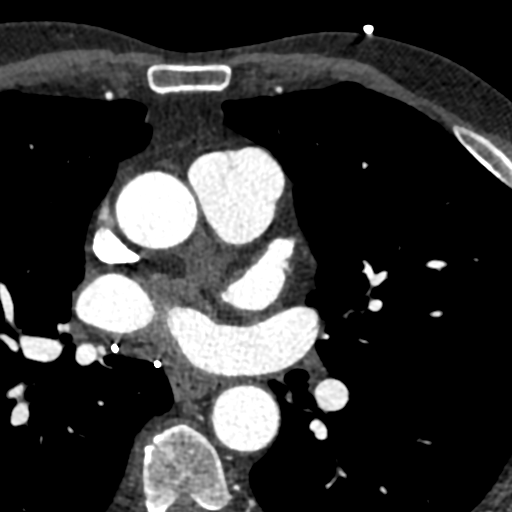
[im 2513/3770  vessel]
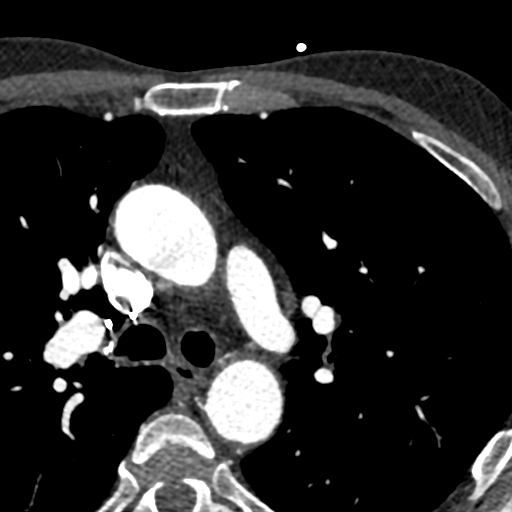
[im 3141/3770  vessel]
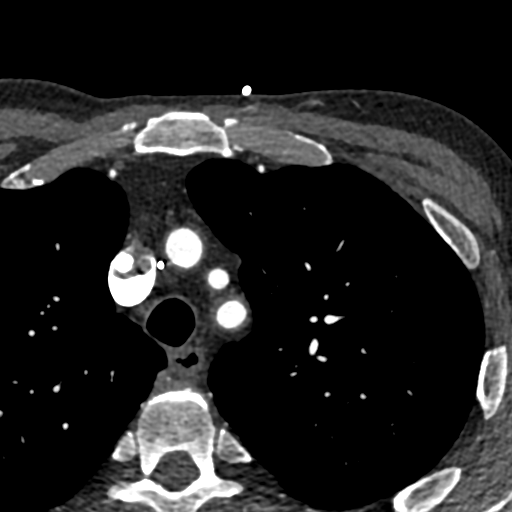

[Series 9: 5-95% · axial · 0.39mm/px · z∈[+1112,+1274]mm · 6 of 3770 slices shown, 8 images]
[im 539/3770  vessel]
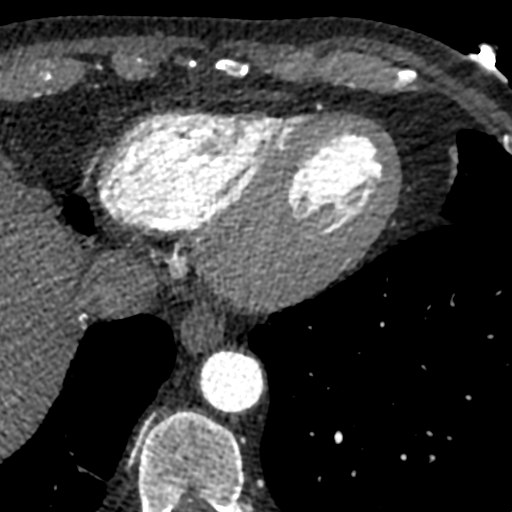
[im 539/3770  lung]
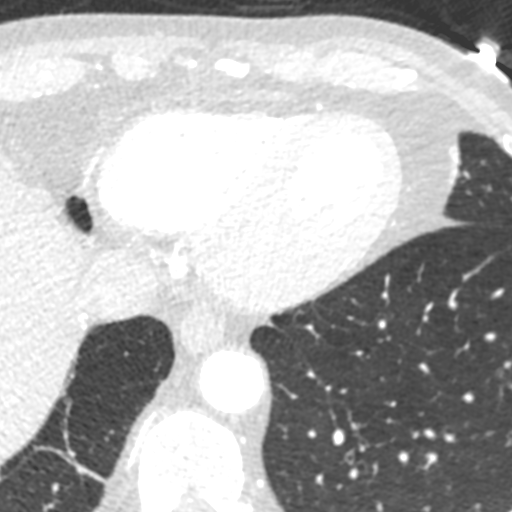
[im 1077/3770  vessel]
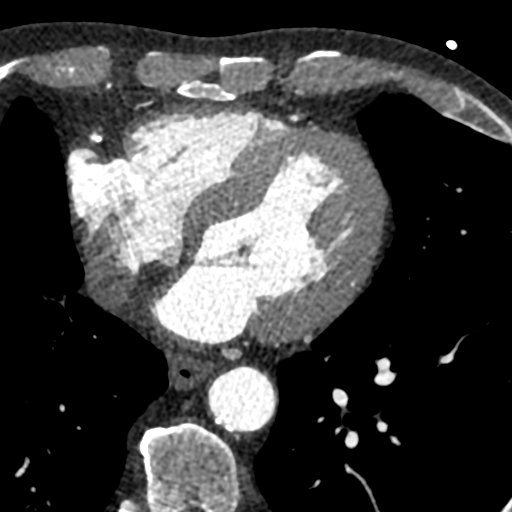
[im 1616/3770  vessel]
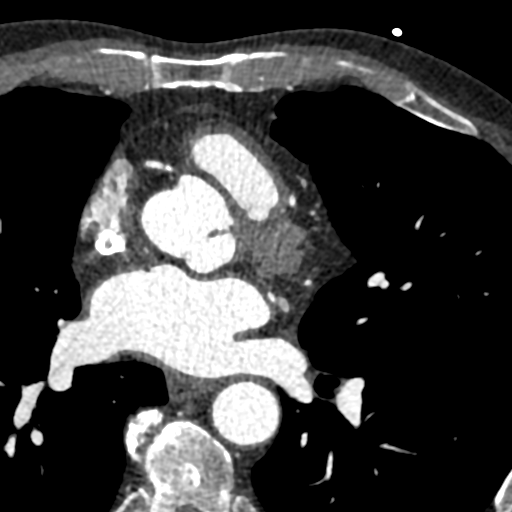
[im 2154/3770  vessel]
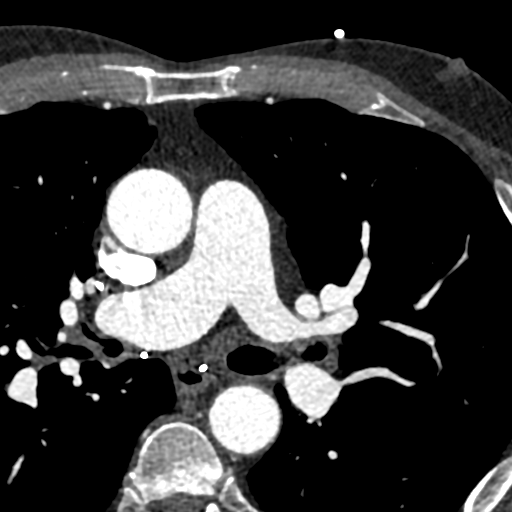
[im 2693/3770  vessel]
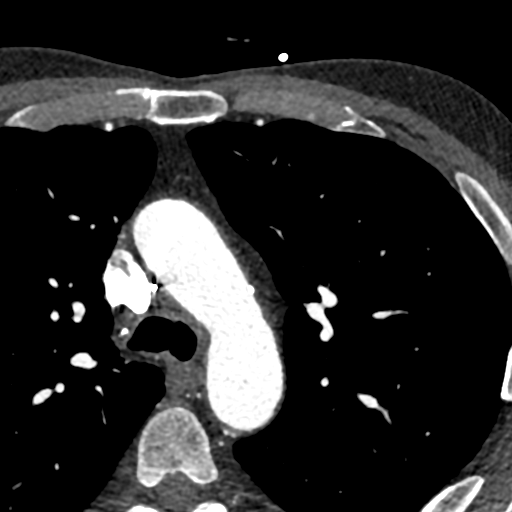
[im 2693/3770  lung]
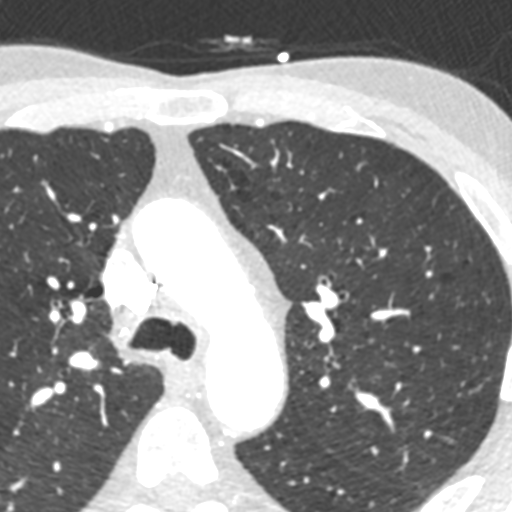
[im 3231/3770  vessel]
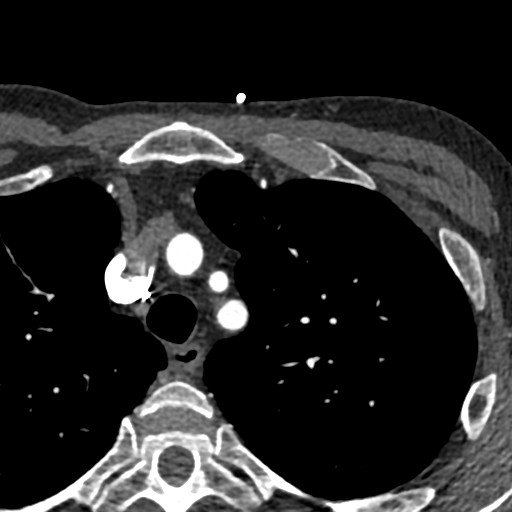

[11 of 20 positions shown; findings below may reference images not displayed]

FINDINGS: Extracardiac findings will be described separately under dictation
for contemporaneously obtained CTA chest, abdomen and pelvis.
IMPRESSION: Please see separate dictation for contemporaneously obtained CTA
chest, abdomen and pelvis dated 09/18/2019 for full description of
relevant extracardiac findings.
FINDINGS: Aortic Valve: Tri leaflet with heavily calcified non coronary cusp
and restricted motion

Aorta: Normal aortic root with no aneurysm Normal arch vessels no
Coarctation and moderate calcific atherosclerosis

Sinotubular Junction: 26 mm

Ascending Thoracic Aorta: 34 mm

Aortic Arch: 28 mm

Descending Thoracic Aorta: 26 mm

Sinus of Valsalva Measurements:

Non-coronary: 33.4 mm

Right - coronary: 30.6 mm

Left - coronary: 34.8 mm

Coronary Artery Height above Annulus:

Left Main: 13.9 mm above annulus

Right Coronary: 16.8 mm above annulus

Virtual Basal Annulus Measurements:

Maximum/Minimum Diameter: 28.8 mm x 23.8 mm

Perimeter: 83.2 mm cc

Area: 531 mm 2

Coronary Arteries: Sufficient height above annulus for deployment

Optimum Fluoroscopic Angle for Delivery: LAO 1 Caudal 3 degrees
IMPRESSION: 1. Tri leaflet AV with calcium score of 1926 and annular area of 531
which is upper range limit for 26 mm Sapien 3 valve

2.  Coronary arteries sufficient height above annulus for deployment

3.  Optimum angiographic angle for deployment LAO 1 Caudal 3 degrees

4.  Normal ascending aortic root 3.4 cm

[facility] [place]

*** End of Addendum ***
EXAM:
OVER-READ INTERPRETATION  CT CHEST

The following report is an over-read performed by radiologist Dr.
Lai Fa Foo [[email or web address]] on 09/18/2019. This
over-read does not include interpretation of cardiac or coronary
anatomy or pathology. The coronary calcium score/coronary CTA
interpretation by the cardiologist is attached.
FINDINGS: Extracardiac findings will be described separately under dictation
for contemporaneously obtained CTA chest, abdomen and pelvis.
IMPRESSION: Please see separate dictation for contemporaneously obtained CTA
chest, abdomen and pelvis dated 09/18/2019 for full description of
relevant extracardiac findings.

## 2021-01-15 NOTE — Telephone Encounter (Signed)
   Pre-operative Risk Assessment    Patient Name: Brett Mcguire  DOB: 06-28-1945 MRN: [id number]{ HEARTCARE STAFF-IMPORTANT INSTRUCTIONS 1 Red and Blue Text will auto delete once note is signed or closed. 2 Press F2 to navigate through template.   3 On drop down lists, L click to select >> R click to activate next field 4 Reason for Visit format is IMPORTANT!!  See Directions on No. 2 below. 5 Please review chart to determine if there is already a clearance note open for this procedure!!  DO NOT duplicate if a note already exists!!         Request for Surgical Clearance{ 1. What type of surgery is being performed? Enter name of procedure below and number of teeth if dental extraction.   Procedure:  COLONOSCOPY 2. When is this surgery scheduled? Press F2 to enter date below and place date in Reason for Visit (see directions below).  Date of Surgery:  Clearance 01/22/21                             { For convenience, highlight and copy (CTL+C) the Clearance MM/DD/YY phrase above. Click here to go to Reason for Visit.  Paste (CTL+V) the date.  Merchandiser, retail.  Then click button underneath called Add Clearance MM/DD/YY as free text.      3. What is the name of the Surgeon, the Surgeon's Group or Practice, phone and fax number?  Press F2 and list below  Surgeon:  NOT LISTED Surgeon's Group or Practice Name:  [facility] Phone number:  [phone] Fax number:  [phone]  5. What type of clearance is requested?  Medical or Cardiac Clearance only?  Pharmacy Clearance Only (Request is to hold medication only)?  Or Both?  Press F2 and select the clearance requested.  If both are needed, select both from the drop down list.      Type of Clearance Requested:   - Medical  5. What type of anesthesia will be used?  Press F2 and select the anesthesia to be used for the procedure.   Type of Anesthesia:  Not Indicated  6. Are there any other requests or questions from the surgeon?     Additional  requests/questions:  NOT LISTED  Signed, Eli Phillips   01/15/2021, 8:59 AM

## 2021-01-15 NOTE — Telephone Encounter (Signed)
° °  Patient Name: Brett Mcguire  DOB: 01-Aug-1945 MRN: [id number]  Primary Cardiologist: Ida Rogue, MD  Chart reviewed as part of pre-operative protocol coverage. Attempted to call patient x 2  regarding his upcoming colonoscopy and cardiac pre-op clearance. Message left on his answering machine to give [place] a call.    Elgie Collard, PA-C 01/15/2021, 11:59 AM

## 2021-01-15 NOTE — Telephone Encounter (Signed)
° °  Name: Brett Mcguire  DOB: 02-May-1945  MRN: [id number]   Primary Cardiologist: Ida Rogue, MD  Chart reviewed as part of pre-operative protocol coverage. Patient was contacted 01/15/2021 in reference to pre-operative risk assessment for pending surgery as outlined below.  HAYTHAM MAHER was last seen on 09/29/2020 by Dr. [name].  Since that day, ANTERRIO MCCLEERY has done well from a cardiac standpoint. He is able to walk a few blocks but still has some shortness of breath which is not new. He has been short of breath since before his TAVR about a year ago.   Therefore, based on ACC/AHA guidelines, the patient would be at acceptable risk for the planned procedure without further cardiovascular testing.   The patient was advised that if he develops new symptoms prior to surgery to contact our office to arrange for a follow-up visit, and he verbalized understanding.  The patient was remove from plavix therapy 09/2020 and remains on ASA 81mg  which can be continued through the procedure. Instructions for other medications will be made by the GI physician performing the procedure.   I will route this recommendation to the requesting party via Epic fax function and remove from pre-op pool. Please call with questions.  Elgie Collard, PA-C 01/15/2021, 2:07 PM

## 2021-01-19 ENCOUNTER — Other Ambulatory Visit: Payer: Medicare Other

## 2021-01-19 ENCOUNTER — Telehealth: Payer: Self-pay

## 2021-01-19 ENCOUNTER — Encounter: Payer: Medicare Other | Admitting: Family Medicine

## 2021-01-19 NOTE — Telephone Encounter (Signed)
Patient has been informed of medical clearance per Dr. [name] office. See note on 01/15/21.

## 2021-01-21 ENCOUNTER — Encounter: Payer: Self-pay | Admitting: Gastroenterology

## 2021-01-22 ENCOUNTER — Ambulatory Visit: Payer: Medicare Other | Admitting: Anesthesiology

## 2021-01-22 ENCOUNTER — Ambulatory Visit
Admission: RE | Admit: 2021-01-22 | Discharge: 2021-01-22 | Disposition: A | Discharge status: Home / Self Care | Payer: Medicare Other | Attending: Gastroenterology | Admitting: Gastroenterology

## 2021-01-22 ENCOUNTER — Encounter: Payer: Self-pay | Admitting: Gastroenterology

## 2021-01-22 ENCOUNTER — Encounter
Admission: RE | Disposition: A | Discharge status: Home / Self Care | Payer: Self-pay | Source: Home / Self Care | Attending: Gastroenterology

## 2021-01-22 DIAGNOSIS — I739 Peripheral vascular disease, unspecified: Secondary | ICD-10-CM | POA: Diagnosis not present

## 2021-01-22 DIAGNOSIS — Z85118 Personal history of other malignant neoplasm of bronchus and lung: Secondary | ICD-10-CM | POA: Insufficient documentation

## 2021-01-22 DIAGNOSIS — Z85828 Personal history of other malignant neoplasm of skin: Secondary | ICD-10-CM | POA: Diagnosis not present

## 2021-01-22 DIAGNOSIS — Z87891 Personal history of nicotine dependence: Secondary | ICD-10-CM | POA: Insufficient documentation

## 2021-01-22 DIAGNOSIS — Z86010 Personal history of colonic polyps: Secondary | ICD-10-CM | POA: Diagnosis not present

## 2021-01-22 DIAGNOSIS — E785 Hyperlipidemia, unspecified: Secondary | ICD-10-CM | POA: Insufficient documentation

## 2021-01-22 DIAGNOSIS — I350 Nonrheumatic aortic (valve) stenosis: Secondary | ICD-10-CM | POA: Diagnosis not present

## 2021-01-22 DIAGNOSIS — I10 Essential (primary) hypertension: Secondary | ICD-10-CM | POA: Insufficient documentation

## 2021-01-22 DIAGNOSIS — N400 Enlarged prostate without lower urinary tract symptoms: Secondary | ICD-10-CM | POA: Insufficient documentation

## 2021-01-22 DIAGNOSIS — Z1211 Encounter for screening for malignant neoplasm of colon: Secondary | ICD-10-CM | POA: Insufficient documentation

## 2021-01-22 DIAGNOSIS — K5730 Diverticulosis of large intestine without perforation or abscess without bleeding: Secondary | ICD-10-CM | POA: Diagnosis not present

## 2021-01-22 DIAGNOSIS — I2510 Atherosclerotic heart disease of native coronary artery without angina pectoris: Secondary | ICD-10-CM | POA: Diagnosis not present

## 2021-01-22 HISTORY — PX: COLONOSCOPY WITH PROPOFOL: SHX5780

## 2021-01-22 SURGERY — COLONOSCOPY WITH PROPOFOL
Anesthesia: General

## 2021-01-22 MED ORDER — PROPOFOL 500 MG/50ML IV EMUL
INTRAVENOUS | Status: DC | PRN
  Administered 2021-01-22: 150 ug/kg/min via INTRAVENOUS

## 2021-01-22 MED ORDER — SODIUM CHLORIDE 0.9 % IV SOLN
INTRAVENOUS | Status: DC
  Administered 2021-01-22: 20 mL/h via INTRAVENOUS

## 2021-01-22 MED ORDER — PROPOFOL 500 MG/50ML IV EMUL
INTRAVENOUS | Status: AC
  Filled 2021-01-22: qty 50

## 2021-01-22 NOTE — Transfer of Care (Signed)
Immediate Anesthesia Transfer of Care Note  Patient: Brett Mcguire  Procedure(s) Performed: COLONOSCOPY WITH PROPOFOL  Patient Location: PACU  Anesthesia Type:General  Level of Consciousness: awake and sedated  Airway & Oxygen Therapy: Patient Spontanous Breathing and Patient connected to nasal cannula oxygen  Post-op Assessment: Report given to RN and Post -op Vital signs reviewed and stable  Post vital signs: Reviewed and stable  Last Vitals:  Vitals Value Taken Time  BP    Temp    Pulse 70 01/22/21 1023  Resp    SpO2 95 % 01/22/21 1023  Vitals shown include unvalidated device data.  Last Pain:  Vitals:   01/22/21 0853  TempSrc: Temporal  PainSc: 0-No pain         Complications: No notable events documented.

## 2021-01-22 NOTE — Anesthesia Procedure Notes (Signed)
Date/Time: 01/22/2021 10:03 AM Performed by: Vaughan Sine Pre-anesthesia Checklist: Patient identified, Emergency Drugs available, Suction available, Patient being monitored and Timeout performed Patient Re-evaluated:Patient Re-evaluated prior to induction Oxygen Delivery Method: Nasal cannula Preoxygenation: Pre-oxygenation with 100% oxygen Induction Type: IV induction Placement Confirmation: positive ETCO2 and CO2 detector

## 2021-01-22 NOTE — H&P (Signed)
Jonathon Bellows, MD [address], [place], [place], [place], [address] [address], [place], [place], [place], [address] Phone: [phone]  Fax: [phone]  Primary Care Physician:  Tonia Ghent, MD   Pre-Procedure History & Physical: HPI:  Brett Mcguire is a 76 y.o. male is here for an colonoscopy.   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 Burt Knack and Dr. Roxy Manns   Severe aortic stenosis     Past Surgical History:  Procedure Laterality Date   COLONOSCOPY  02/18/2000   Polyp, diverticulosis, repeat in 5 years   COLONOSCOPY  03/03/2006   Repeat  -  Divertics   COLONOSCOPY WITH PROPOFOL N/A 12/22/2017   Procedure: COLONOSCOPY WITH PROPOFOL;  Surgeon: Virgel Manifold, MD;  Location: [facility];  Service: Endoscopy;  Laterality: N/A;   CYSTOSCOPY WITH INSERTION OF UROLIFT     Laminectomy/Discectomy  11/03/2005   L5/S1   Malignant melanoma excision  06/20/2003   Dr. Nehemiah Massed   MRI  10/09/2005   Lumbar spine, bulging disc L5/S1   Right lobectomy  1989   Lung cancer   RIGHT/LEFT HEART CATH AND CORONARY ANGIOGRAPHY Bilateral 09/06/2019   Procedure: RIGHT/LEFT HEART CATH AND CORONARY ANGIOGRAPHY;  Surgeon: Minna Merritts, MD;  Location: [facility] [facility];  Service: Cardiovascular;  Laterality: Bilateral;   TEE WITHOUT CARDIOVERSION N/A 10/02/2019   Procedure: TRANSESOPHAGEAL ECHOCARDIOGRAM (TEE);  Surgeon: Sherren Mocha, MD;  Location: [facility];  Service: Open Heart  Surgery;  Laterality: N/A;   TONSILLECTOMY AND ADENOIDECTOMY     TRANSCATHETER AORTIC VALVE REPLACEMENT, TRANSFEMORAL  10/02/2019   Transcatheter Aortic Valve Replacement - Percutaneous Left Transfemoral Approach   TRANSCATHETER AORTIC VALVE REPLACEMENT, TRANSFEMORAL N/A 10/02/2019   Procedure: TRANSCATHETER AORTIC VALVE REPLACEMENT, TRANSFEMORAL USING EDWARDS SAPIEN 3 ULTRA 26 MM THV.;  Surgeon: Sherren Mocha, MD;  Location: [facility];  Service: Open Heart Surgery;  Laterality: N/A;    Prior to Admission medications   Medication Sig Start Date End Date Taking? Authorizing Provider  amLODipine (NORVASC) 10 MG tablet Take 1 tablet (10 mg total) by mouth daily. 09/24/20  Yes Minna Merritts, MD  aspirin 81 MG chewable tablet Chew 1 tablet (81 mg total) by mouth daily. 10/04/19  Yes Eileen Stanford, PA-C  atorvastatin (LIPITOR) 40 MG tablet TAKE 1 TABLET BY MOUTH DAILY 11/17/20  Yes Gollan, Kathlene November, MD  folic acid (FOLVITE) 1 MG tablet Take 1 mg by mouth See admin instructions. Take 1 tablet (1 mg) by mouth on 6 days in the week, do NOT take on Mondays. 08/25/19  Yes [provider]  methotrexate 2.5 MG tablet Take 10 mg by mouth every Monday.  08/25/19  Yes [provider]  Polyethyl Glycol-Propyl Glycol (SYSTANE ULTRA) 0.4-0.3 % SOLN Place 1 drop into both eyes daily as needed (dry/irritated eyes.).    Yes [provider]  Skin Protectants, Misc. (EUCERIN) cream Apply 1 application topically daily as needed for dry skin.   Yes [provider]  spironolactone (ALDACTONE) 25 MG tablet TAKE 1/2 TABLET EVERYDAY 09/29/20  Yes Eileen Stanford, PA-C  amoxicillin (AMOXIL) 500 MG capsule Take 2,000 mg by mouth as directed. Take 2,000 mg one hour prior to all dental visits. Patient not taking: No sig reported    [provider]    Allergies as of 12/24/2020 - Review Complete 09/24/2020  Allergen Reaction Noted   Ephedrine Other (See Comments) 04/02/2013    Finasteride Diarrhea 12/13/2013   Sulfa antibiotics  04/19/2007   Sulfonamide derivatives Other (See Comments) 04/19/2007   Tadalafil Other (See Comments) 02/24/2015   Alfuzosin Rash and Other (See Comments) 12/13/2013   Doxazosin Rash and Other (See Comments) 12/13/2013   Flomax [tamsulosin hcl] Rash 05/04/2013   Rapaflo [silodosin] Rash 05/04/2013    Family History  Problem Relation Age of Onset   Cancer Mother        [place] throat   Alcohol abuse Mother    Cancer Father        4 kinds; 5 places, prostate and lung   Prostate cancer Father        possible prostate cancer   Cancer Other        Skin, deceased in April 08, 1994   Colon cancer Neg Hx        none known    Social History   Socioeconomic History   Marital status: Married    Spouse name: International aid/development worker   Number of children: 2   Years of education: Not on file   Highest education level: Not on file  Occupational History   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    Married 50+ years   Social Determinants of Radio broadcast assistant Strain: Not on file  Food Insecurity: Not on file  Transportation Needs: Not on file  Physical Activity: Not on file  Stress: Not on file  Social Connections: Not on file  Intimate Partner Violence: Not on file    Review of Systems: See HPI, otherwise negative ROS  Physical Exam: BP 133/76    Pulse 77    Temp (!) 96.1 F (35.6 C) (Temporal)    Resp 20    Ht 5' 8.5" (1.74 m)     Wt 70.3 kg    SpO2 99%    BMI 23.22 kg/m  General:   Alert,  pleasant and cooperative in NAD Head:  Normocephalic and atraumatic. Neck:  Supple; no masses or thyromegaly. Lungs:  Clear throughout to auscultation, normal respiratory effort.    Heart:  +S1, +S2, Regular rate and rhythm, No edema. Abdomen:  Soft, nontender and nondistended. Normal bowel sounds, without guarding, and without rebound.   Neurologic:  Alert and  oriented x4;  grossly normal neurologically.  Impression/Plan: Brett Mcguire is here for an colonoscopy to be performed for surveillance due to prior history of colon polyps   Risks, benefits, limitations, and alternatives regarding  colonoscopy have been reviewed with the patient.  Questions have been answered.  All parties agreeable.   Jonathon Bellows, MD  01/22/2021, 9:39 AM

## 2021-01-22 NOTE — Op Note (Signed)
[facility] Patient Name: Brett Mcguire Procedure Date: 01/22/2021 10:01 AM MRN: [id number] Account #: [id number] Date of Birth: 11/09/1945 Admit Type: Outpatient Age: 76 Room: [facility] ENDO ROOM 3 Gender: Male Note Status: Finalized Instrument Name: Park Meo [id number] Procedure:             Colonoscopy Indications:           Surveillance: Personal history of adenomatous polyps                         on last colonoscopy 3 years ago Providers:             Jonathon Bellows MD, MD Referring MD:          Elveria Rising. Damita Dunnings, MD (Referring MD) Medicines:             Monitored Anesthesia Care Complications:         No immediate complications. Procedure:             Pre-Anesthesia Assessment:                        - Prior to the procedure, a History and Physical was                         performed, and patient medications, allergies and                         sensitivities were reviewed. The patient's tolerance                         of previous anesthesia was reviewed.                        - The risks and benefits of the procedure and the                         sedation options and risks were discussed with the                         patient. All questions were answered and informed                         consent was obtained.                        - ASA Grade Assessment: II - A patient with mild                         systemic disease.                        After obtaining informed consent, the colonoscope was                         passed under direct vision. Throughout the procedure,                         the patient's blood pressure, pulse, and oxygen  saturations were monitored continuously. The                         Colonoscope was introduced through the anus and                         advanced to the the cecum, identified by the                         appendiceal orifice. The colonoscopy was performed                          with ease. The patient tolerated the procedure well.                         The quality of the bowel preparation was good. Findings:      The perianal and digital rectal examinations were normal.      Multiple small-mouthed diverticula were found in the sigmoid colon.      The exam was otherwise without abnormality on direct and retroflexion       views. Impression:            - Diverticulosis in the sigmoid colon.                        - The examination was otherwise normal on direct and                         retroflexion views.                        - No specimens collected. Recommendation:        - Discharge patient to home (with escort).                        - Resume previous diet.                        - Continue present medications.                        - Repeat colonoscopy is not recommended due to current                         age (75 years or older) for screening purposes. Procedure Code(s):     --- Professional ---                        [phone], Colonoscopy, flexible; diagnostic, including                         collection of specimen(s) by brushing or washing, when                         performed (separate procedure) Diagnosis Code(s):     --- Professional ---                        Z86.010, Personal history of colonic polyps  K57.30, Diverticulosis of large intestine without                         perforation or abscess without bleeding CPT copyright 2019 American Medical Association. All rights reserved. The codes documented in this report are preliminary and upon coder review may  be revised to meet current compliance requirements. Jonathon Bellows, MD Jonathon Bellows MD, MD 01/22/2021 10:25:09 AM This report has been signed electronically. Number of Addenda: 0 Note Initiated On: 01/22/2021 10:01 AM Scope Withdrawal Time: 0 hours 10 minutes 35 seconds  Total Procedure Duration: 0 hours 13 minutes 49 seconds  Estimated Blood Loss:   Estimated blood loss: none.      [facility]

## 2021-01-22 NOTE — Anesthesia Preprocedure Evaluation (Signed)
Anesthesia Evaluation  Patient identified by MRN, date of birth, ID band Patient awake    Reviewed: Allergy & Precautions, NPO status , Patient's Chart, lab work & pertinent test results  History of Anesthesia Complications Negative for: history of anesthetic complications  Airway Mallampati: I       Dental  (+) Dental Advidsory Given, Caps, Teeth Intact   Pulmonary neg sleep apnea, neg COPD, Not current smoker, former smoker,           Cardiovascular hypertension, Pt. on medications (-) angina+ Peripheral Vascular Disease  (-) Past MI and (-) Cardiac Stents (-) dysrhythmias + Valvular Problems/Murmurs (severe AS s/p TAVR) AS      Neuro/Psych neg Seizures TIA   GI/Hepatic Neg liver ROS, neg GERD  ,  Endo/Other  neg diabetes  Renal/GU negative Renal ROS     Musculoskeletal   Abdominal   Peds  Hematology   Anesthesia Other Findings Past Medical History: 1989: Allergy No date: BCC (basal cell carcinoma of skin)     Comment:  L cheek No date: BPH (benign prostatic hyperplasia) No date: Carotid artery disease ([place]) 02/2000: Diverticulosis 1989: Hyperlipidemia No date: Hypertension 09/26/2019: Incidental lung nodule, less than or equal to 53mm 1989: Lung cancer ([place])     Comment:  s/p lobectomy, no chemo or rady No date: Melanoma of skin ([place])     Comment:  chest No date: Plaque psoriasis 10/02/2019: S/P TAVR (transcatheter aortic valve replacement)     Comment:  s/p TAVR with a 26 mm Edwards S3U via the TF approach by              Dr. Burt Knack and Dr. Roxy Manns No date: Severe aortic stenosis   Reproductive/Obstetrics                             Anesthesia Physical  Anesthesia Plan  ASA: 3  Anesthesia Plan: General   Post-op Pain Management:    Induction: Intravenous  PONV Risk Score and Plan: 2 and Propofol infusion and TIVA  Airway Management Planned: Nasal Cannula and Natural  Airway  Additional Equipment:   Intra-op Plan:   Post-operative Plan:   Informed Consent: I have reviewed the patients History and Physical, chart, labs and discussed the procedure including the risks, benefits and alternatives for the proposed anesthesia with the patient or authorized representative who has indicated his/her understanding and acceptance.       Plan Discussed with:   Anesthesia Plan Comments:         Anesthesia Quick Evaluation

## 2021-01-23 ENCOUNTER — Encounter: Payer: Self-pay | Admitting: Gastroenterology

## 2021-01-24 NOTE — Anesthesia Postprocedure Evaluation (Signed)
Anesthesia Post Note  Patient: Brett Mcguire  Procedure(s) Performed: COLONOSCOPY WITH PROPOFOL  Patient location during evaluation: Endoscopy Anesthesia Type: General Level of consciousness: awake and alert Pain management: pain level controlled Vital Signs Assessment: post-procedure vital signs reviewed and stable Respiratory status: spontaneous breathing, nonlabored ventilation, respiratory function stable and patient connected to nasal cannula oxygen Cardiovascular status: blood pressure returned to baseline and stable Postop Assessment: no apparent nausea or vomiting Anesthetic complications: no   No notable events documented.   Last Vitals:  Vitals:   01/22/21 0853 01/22/21 1023  BP: 133/76 (!) 97/58  Pulse: 77 67  Resp: 20 13  Temp: (!) 35.6 C (!) 36.3 C  SpO2: 99% 94%    Last Pain:  Vitals:   01/23/21 0732  TempSrc:   PainSc: 0-No pain                 Martha Clan

## 2021-01-25 IMAGING — CR DG CHEST 2V
2 series · 2 of 2 positions shown · non-contrast
Comparison: 11/16/2026

CLINICAL DATA: Preoperative evaluation for aortic valve
replacement, history of right lung cancer status post partial right
pneumonectomy

EXAM:
CHEST - 2 VIEW

[w chest pa]
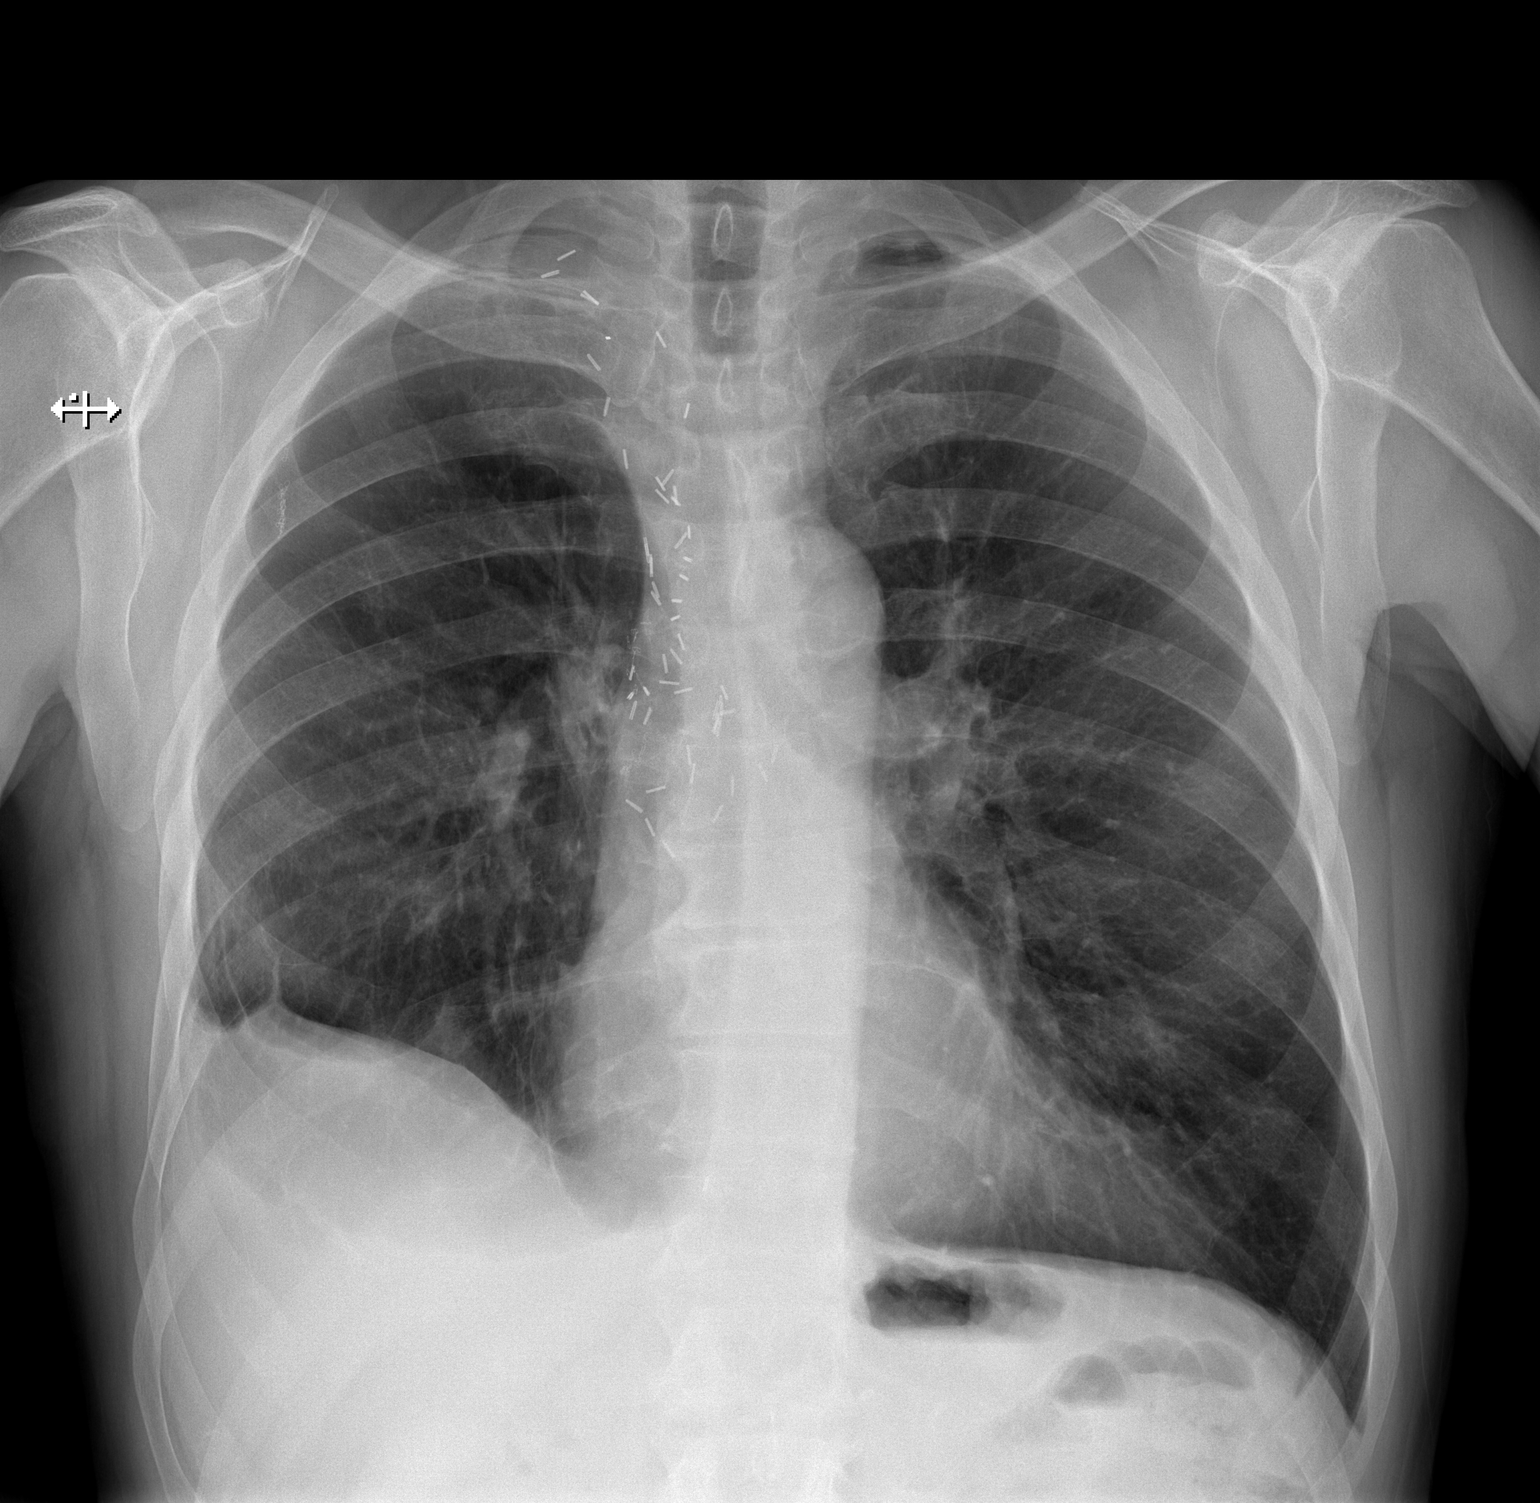

[w chest lat]
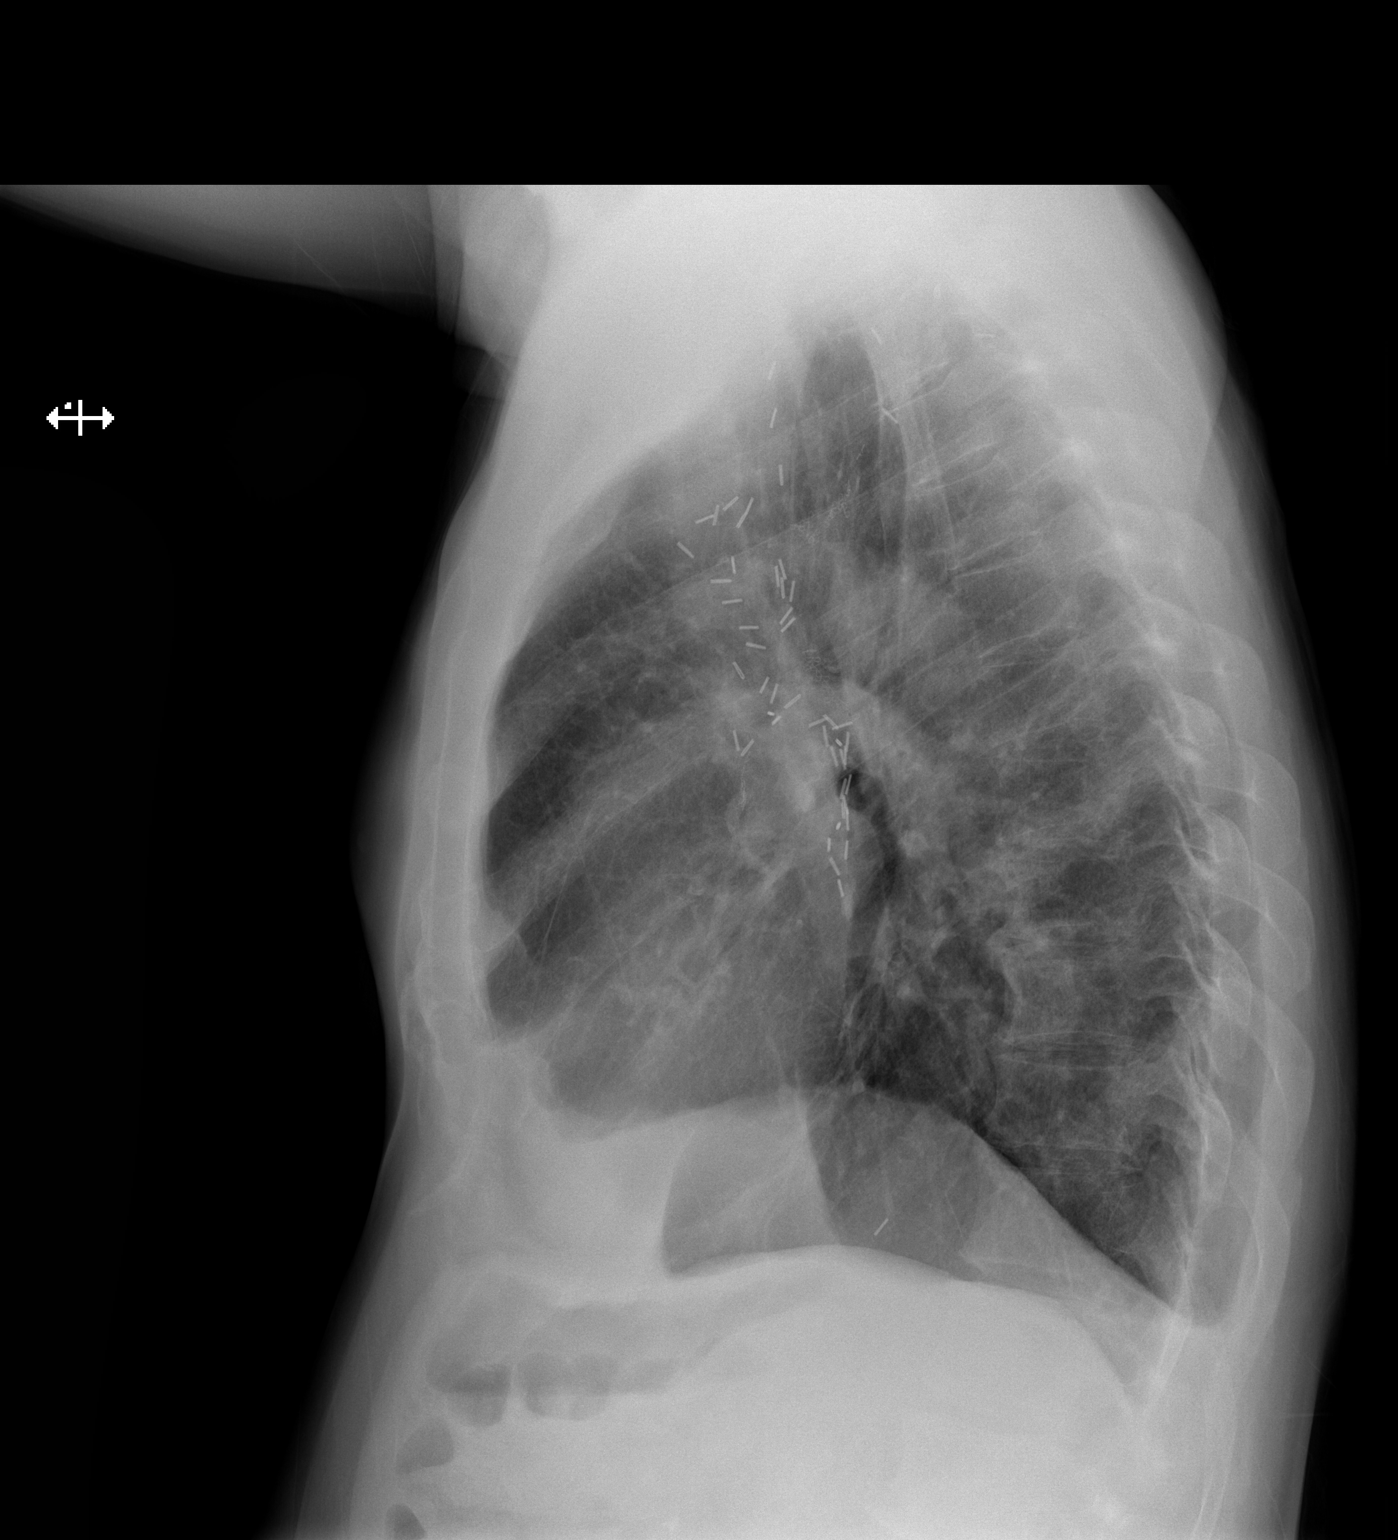

[2 of 2 positions shown; findings below may reference images not displayed]

FINDINGS: Frontal and lateral views of the chest demonstrates stable
postsurgical changes in the right hemithorax from partial right
pneumonectomy. No airspace disease, effusion, or pneumothorax. The
cardiac silhouette is stable. No acute bony abnormalities.
IMPRESSION: 1. Stable exam, no acute process.

## 2021-01-26 ENCOUNTER — Encounter: Payer: Medicare Other | Admitting: Family Medicine

## 2021-02-11 ENCOUNTER — Telehealth: Payer: Self-pay | Admitting: Family Medicine

## 2021-02-11 DIAGNOSIS — R911 Solitary pulmonary nodule: Secondary | ICD-10-CM

## 2021-02-11 NOTE — Telephone Encounter (Signed)
Please call patient about scheduling follow-up chest CT regarding previously seen lung nodule.  I put in the order.  He should get a phone call about scheduling.  If he does not get a phone call in the next 2 weeks then please let me know.  Thanks.

## 2021-02-12 NOTE — Telephone Encounter (Signed)
Pts wife notified as instructed and voiced understanding. (DPR signed).

## 2021-03-07 IMAGING — CT CT HEAD W/O CM
3 series · 15 of 47 positions shown, 18 images · non-contrast
Comparison: CT 12/28/2013

CLINICAL DATA: Altered speech and confusion which lasted 20 minutes

EXAM:
CT HEAD WITHOUT CONTRAST
TECHNIQUE: Contiguous axial images were obtained from the base of the skull
through the vertex without intravenous contrast.

[Series 3: head wo · axial · 0.44mm/px · z∈[-128,-3]mm · 9 of 30 slices shown, 12 images]
[im 3/30  brain]
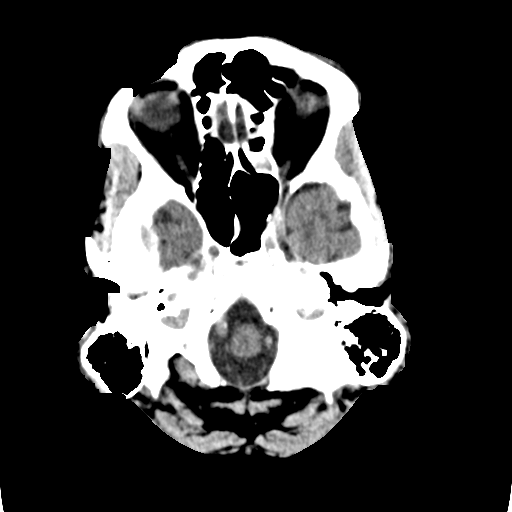
[im 3/30  bone]
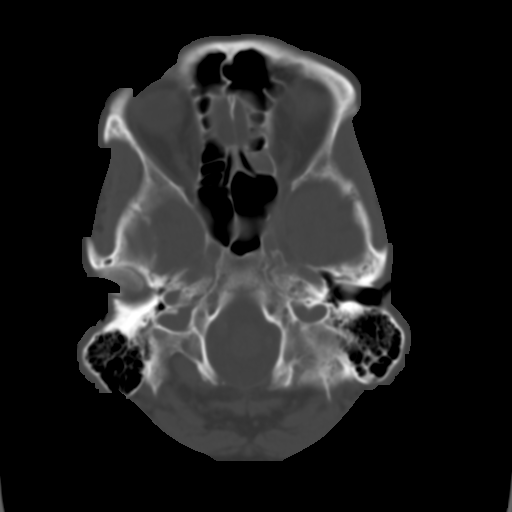
[im 6/30  brain]
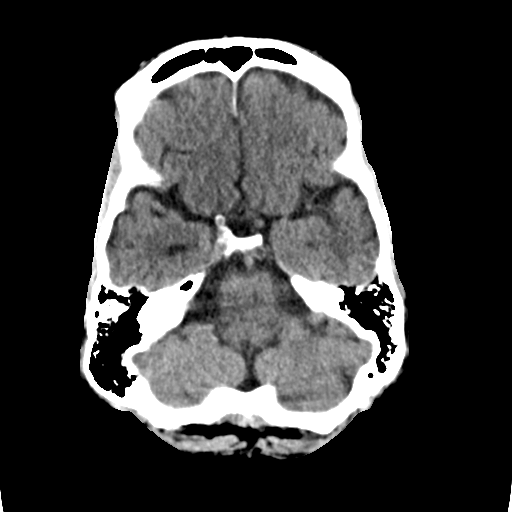
[im 9/30  brain]
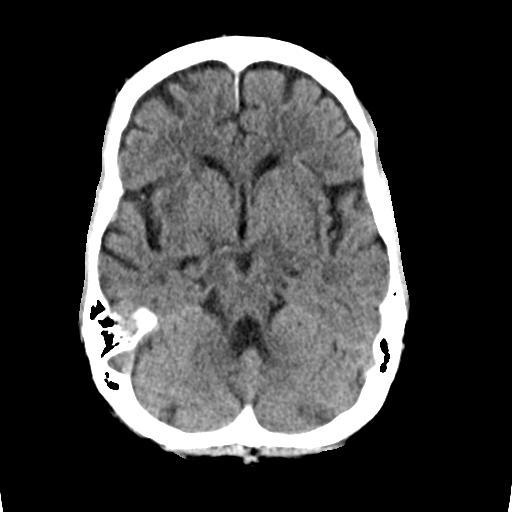
[im 12/30  brain]
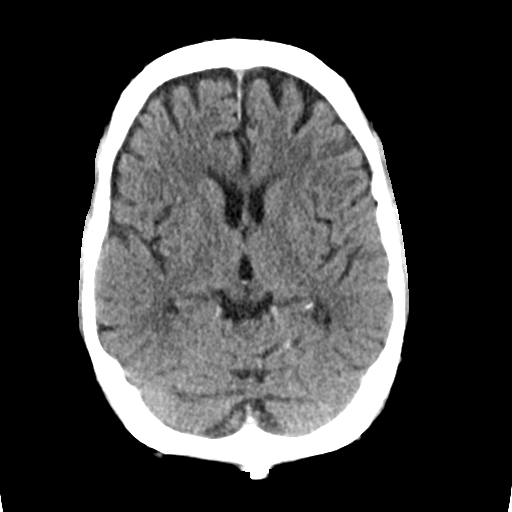
[im 16/30  brain]
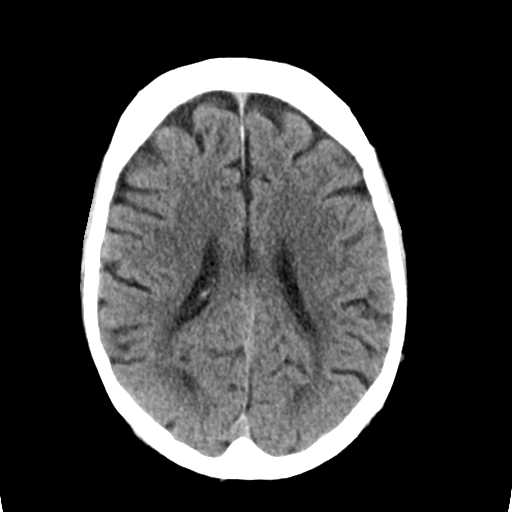
[im 16/30  bone]
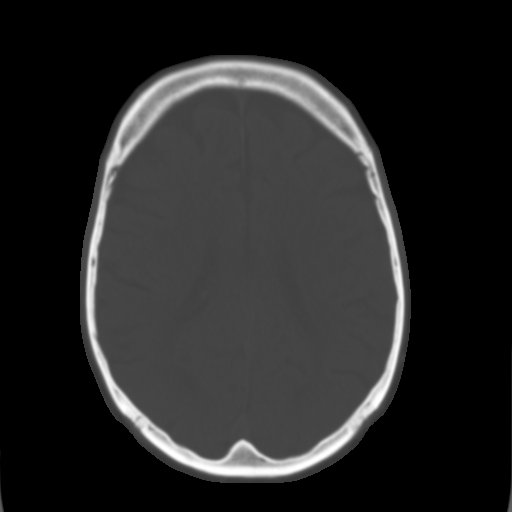
[im 19/30  brain]
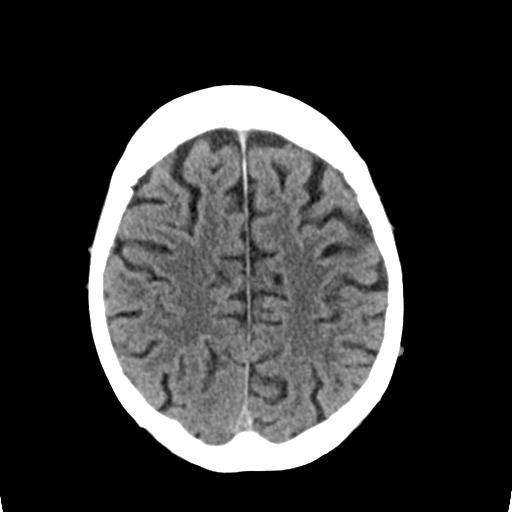
[im 22/30  brain]
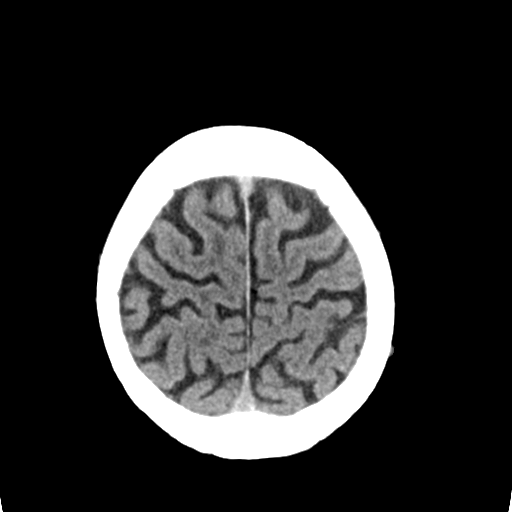
[im 25/30  brain]
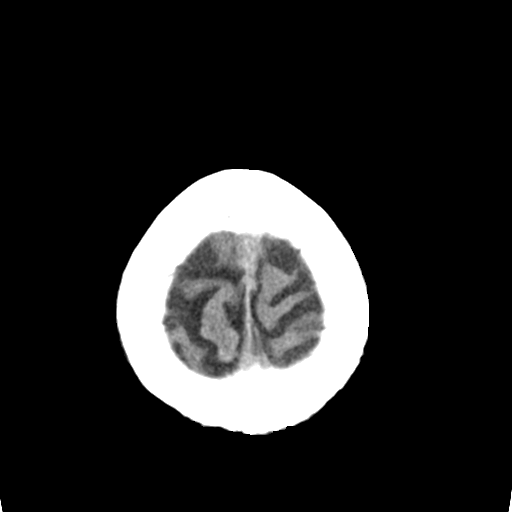
[im 28/30  brain]
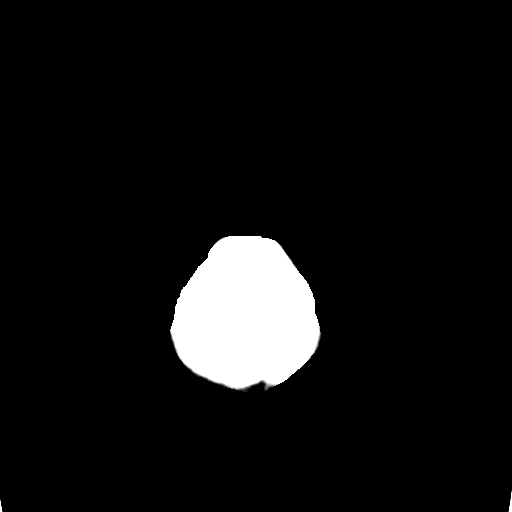
[im 28/30  bone]
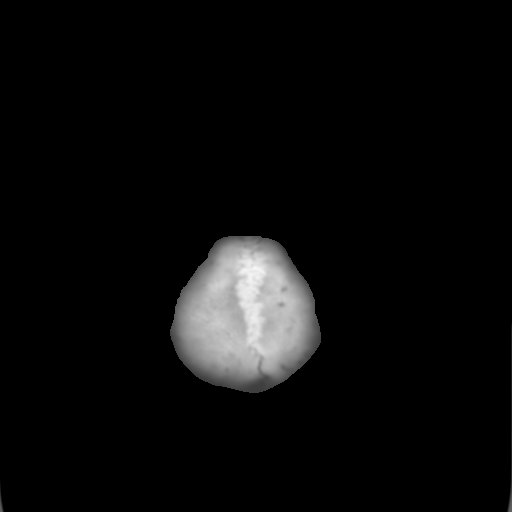

[Series 4: coronal soft tissue · coronal · 0.33mm/px · 3 of 69 slices shown]
[im 23/69  brain]
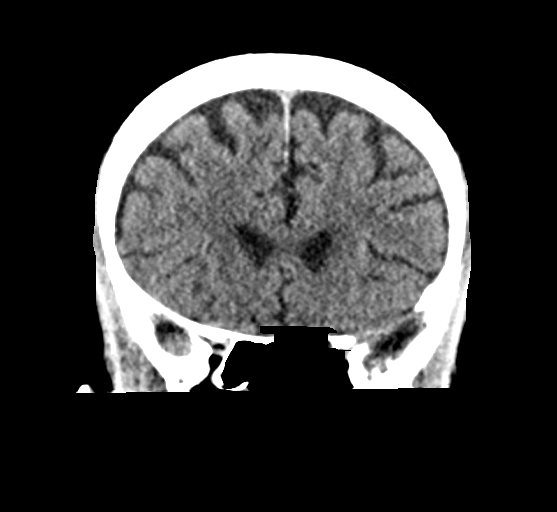
[im 31/69  brain]
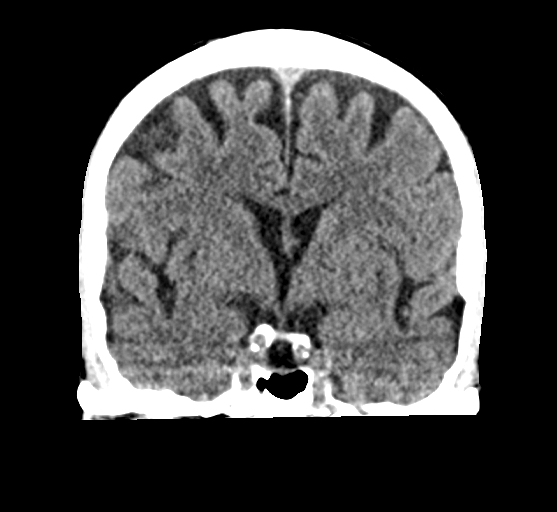
[im 38/69  brain]
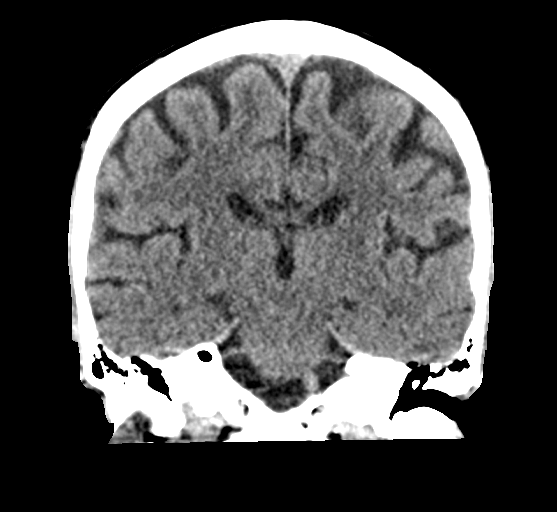

[Series 5: sagittal soft tissue · sagittal · 0.34mm/px · 3 of 55 slices shown]
[im 19/55  brain]
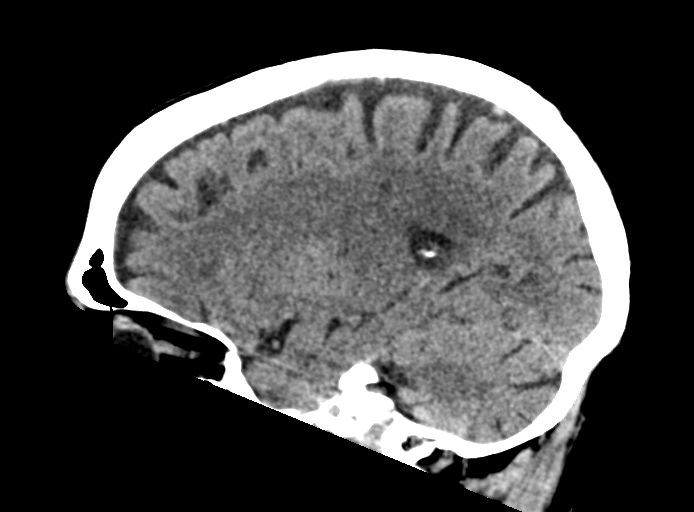
[im 28/55  brain]
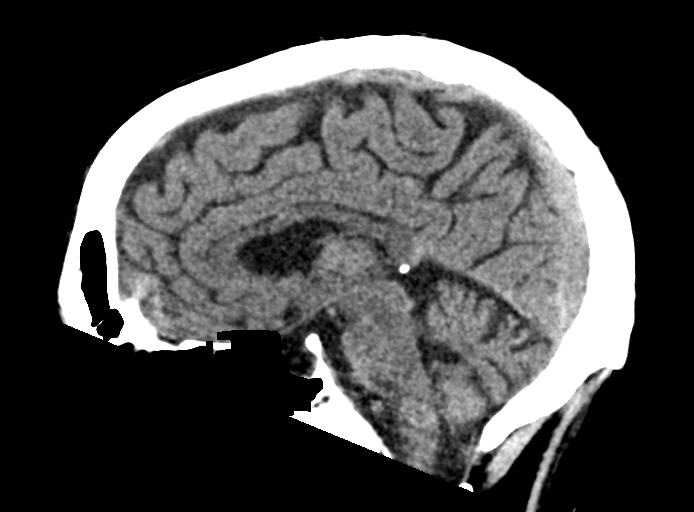
[im 37/55  brain]
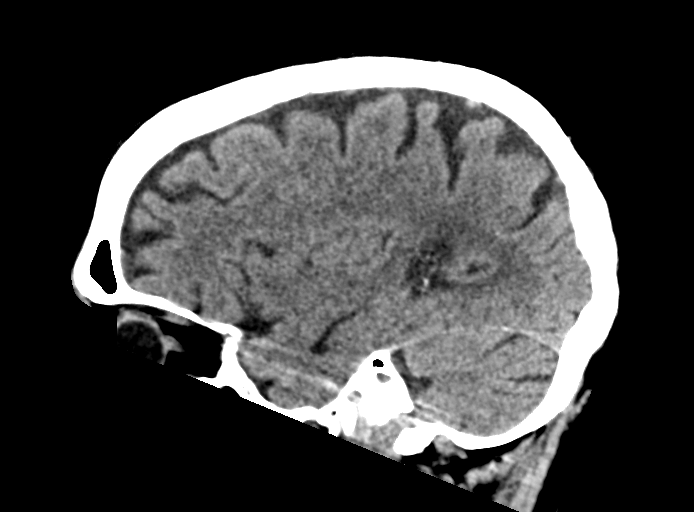

[15 of 47 positions shown; findings below may reference images not displayed]

FINDINGS: Brain: No evidence of acute infarction, hemorrhage, hydrocephalus,
extra-axial collection, visible mass lesion or mass effect.
Symmetric prominence of the ventricles, cisterns and sulci
compatible with parenchymal volume loss. Patchy areas of white
matter hypoattenuation are most compatible with chronic
microvascular angiopathy.

Vascular: Atherosclerotic calcification of the carotid siphons. No
hyperdense vessel.

Skull: No calvarial fracture or suspicious osseous lesion. No scalp
swelling or hematoma.

Sinuses/Orbits: Paranasal sinuses and mastoid air cells are
predominantly clear. Included orbital structures are unremarkable.

Other: Debris in the left external auditory canal.
IMPRESSION: 1. No acute intracranial findings. If there is persisting clinical
concern for infarct, MRI is more sensitive and specific for early
changes of ischemia.
2. Chronic microvascular angiopathy and parenchymal volume loss.
3. Debris in the left external auditory canal, correlate for cerumen
impaction.

## 2021-03-07 IMAGING — DX DG SHOULDER 2+V*R*
3 series · 3 of 3 positions shown · non-contrast
Comparison: None.

CLINICAL DATA: Shoulder pain

EXAM:
RIGHT SHOULDER - 2+ VIEW

[shoulder axial]
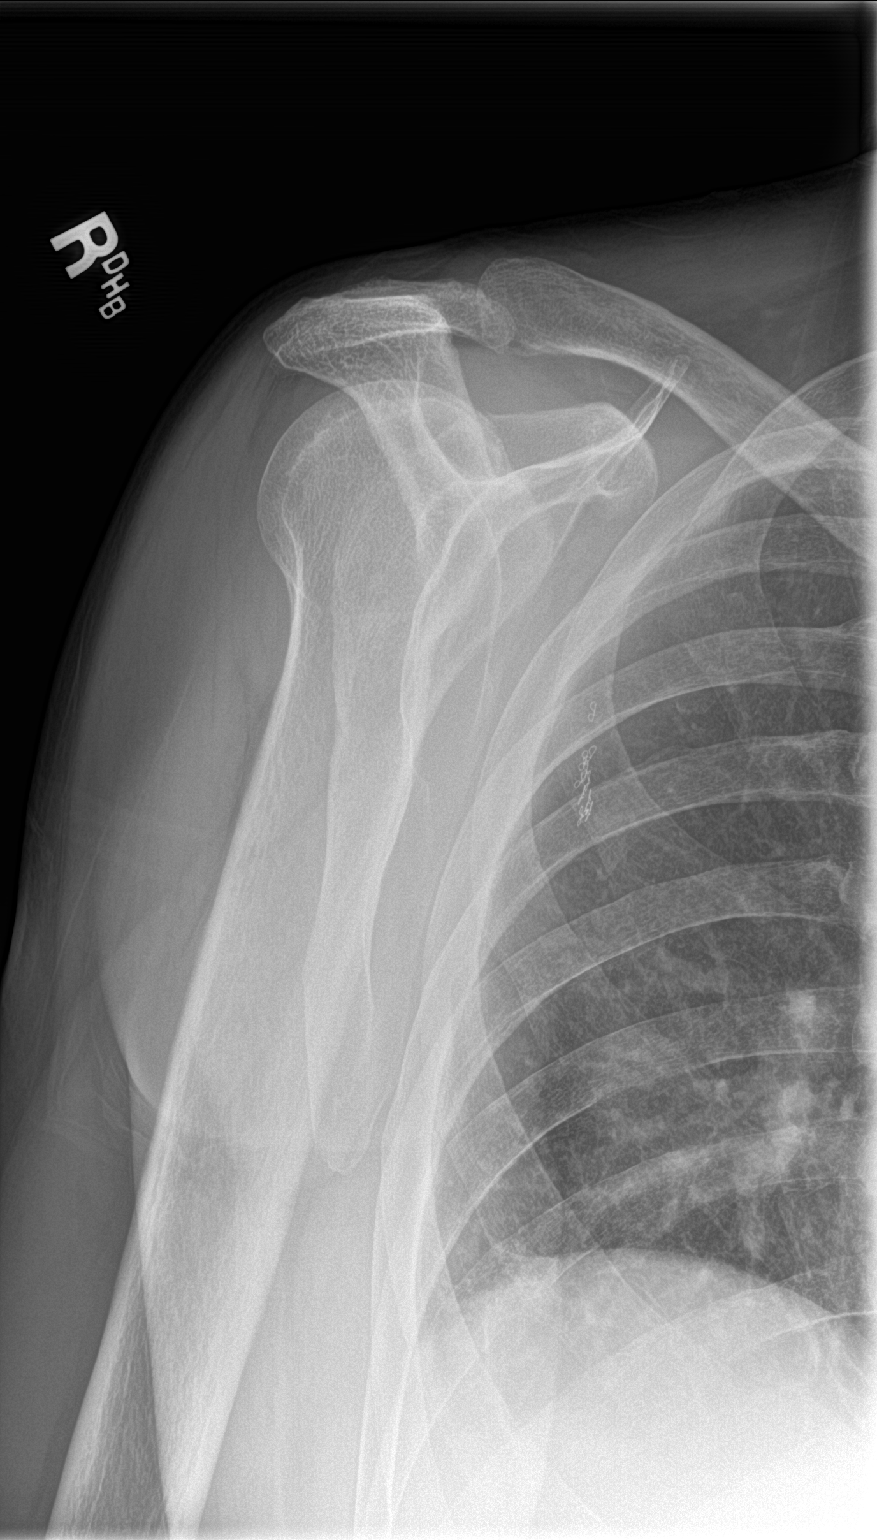

[shoulder ap]
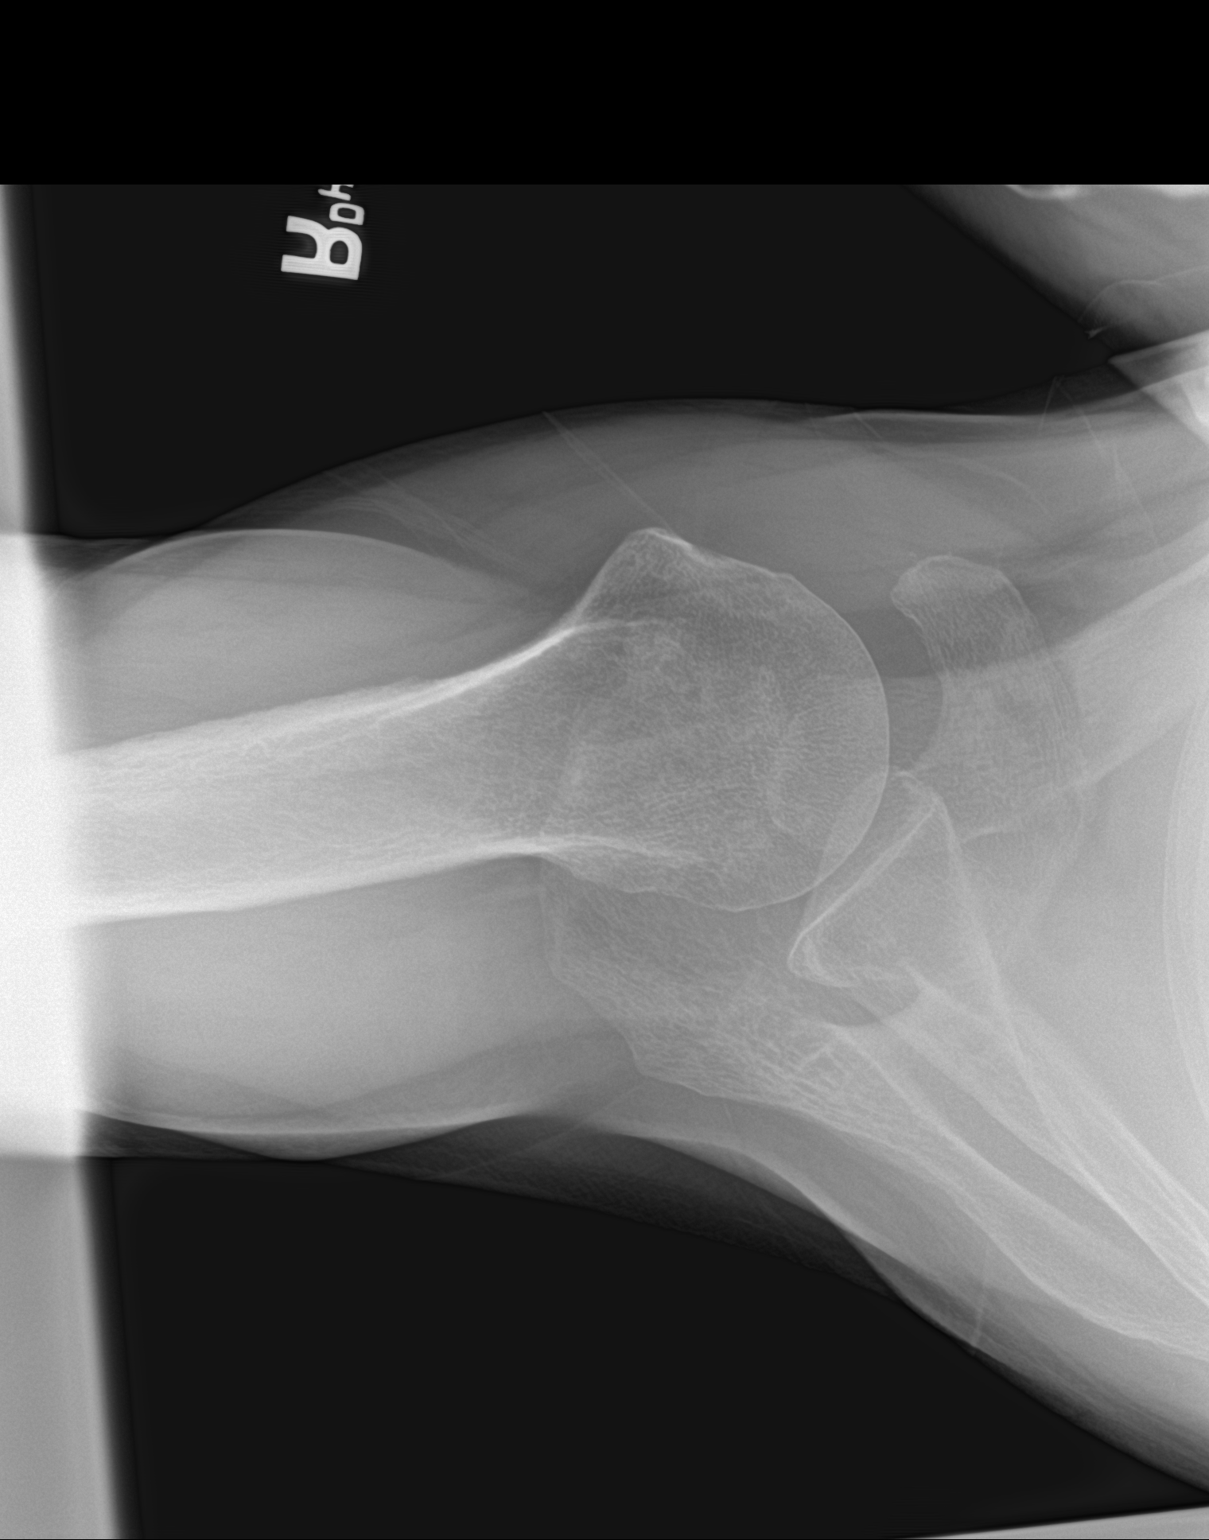

[shoulder obl]
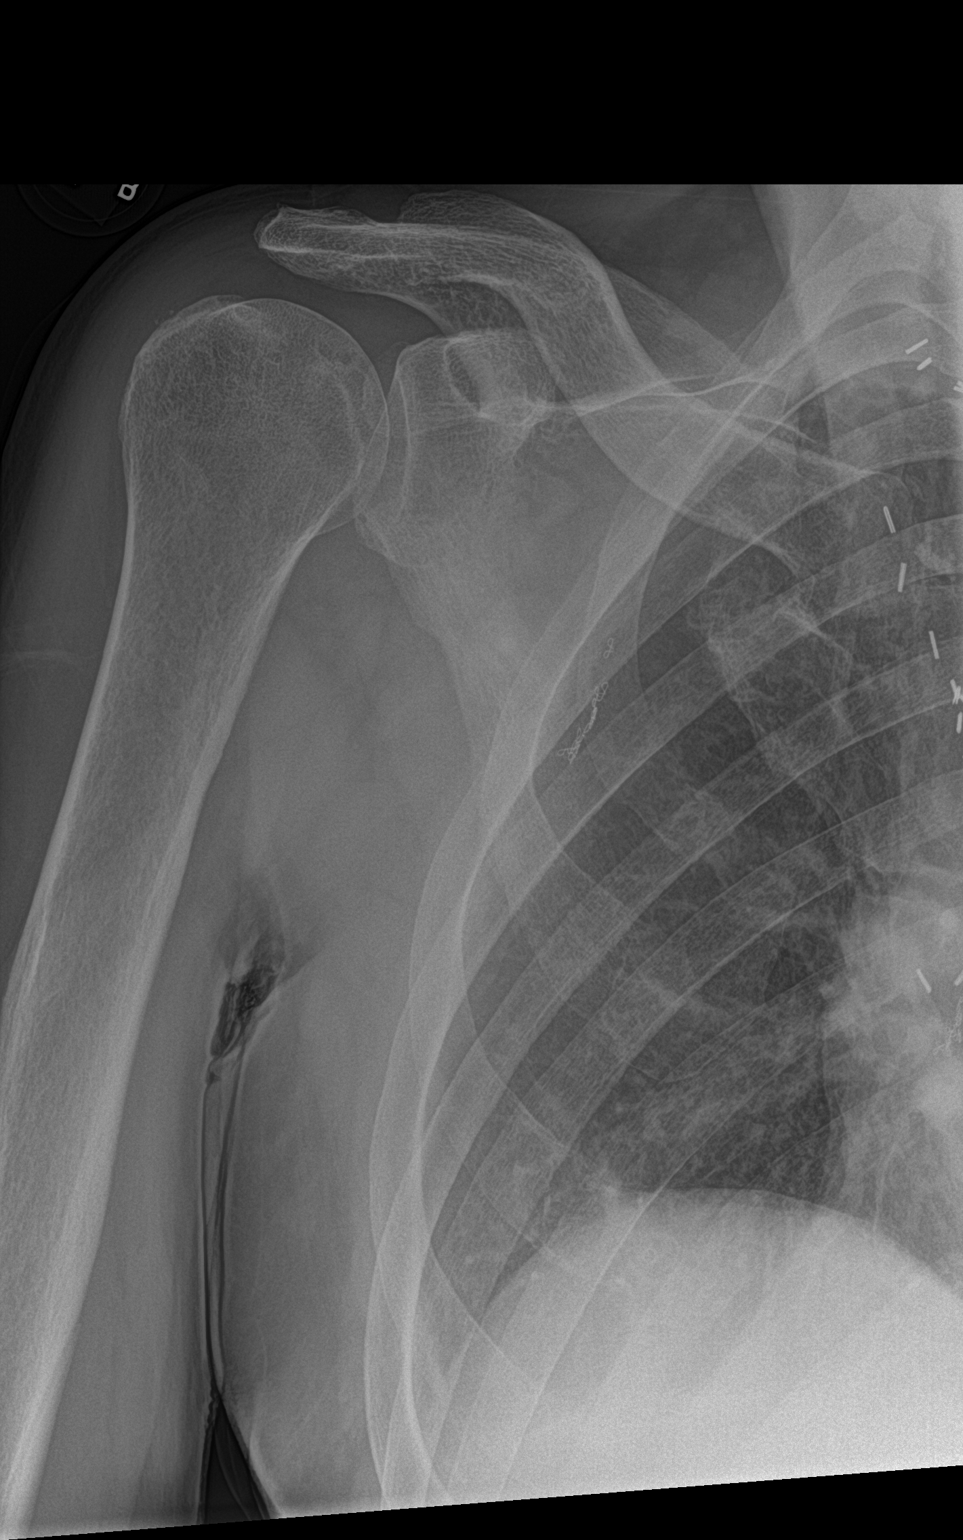

[3 of 3 positions shown; findings below may reference images not displayed]

FINDINGS: Glenohumeral joint is intact. No evidence of scapular fracture or
humeral fracture. The acromioclavicular joint is intact.
IMPRESSION: No fracture or dislocation.

## 2021-03-07 IMAGING — US US CAROTID DUPLEX BILAT
1 series · 13 of 24 positions shown · non-contrast
Comparison: None.

CLINICAL DATA: 73 year old male with history of TIA

EXAM:
BILATERAL CAROTID DUPLEX ULTRASOUND
TECHNIQUE: Gray scale imaging, color Doppler and duplex ultrasound were
performed of bilateral carotid and vertebral arteries in the neck.

[Series 1: us carotid bilateral · 13 of 67 slices shown]
[im 1/67]
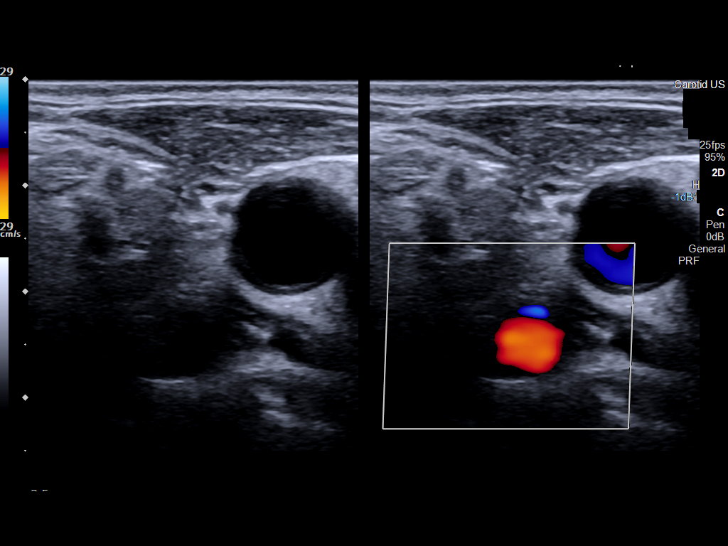
[im 6/67]
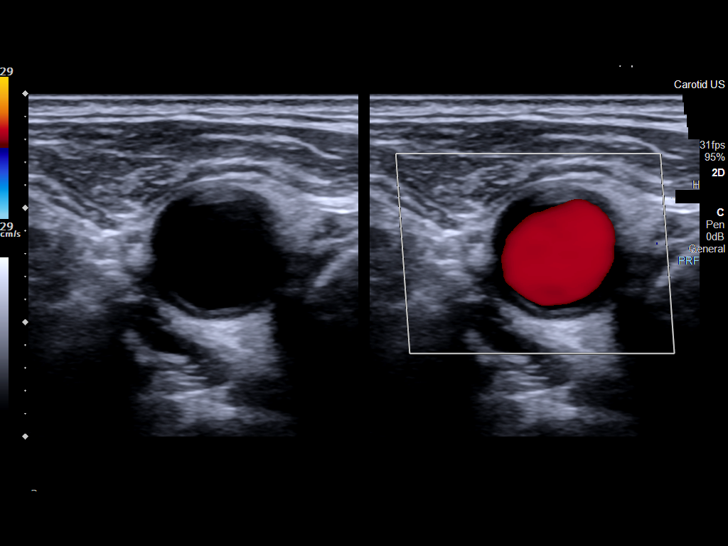
[im 12/67]
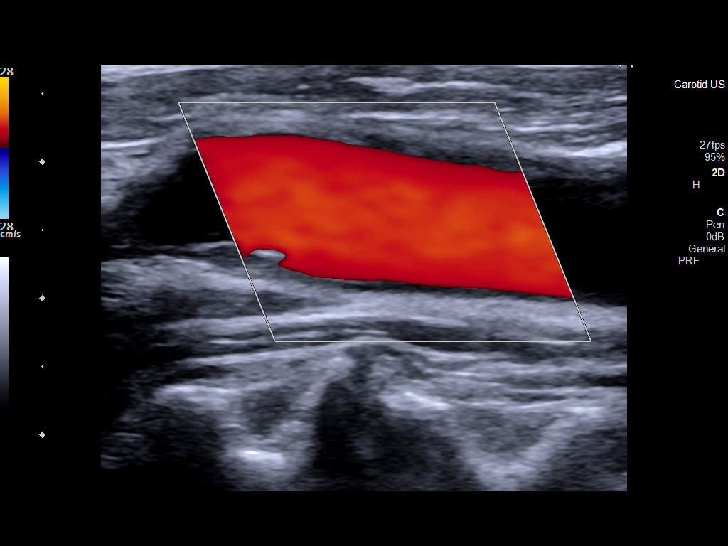
[im 18/67]
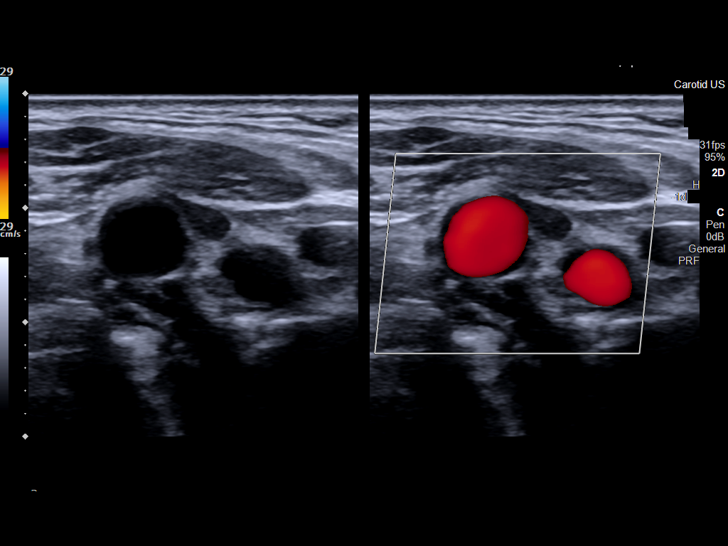
[im 23/67]
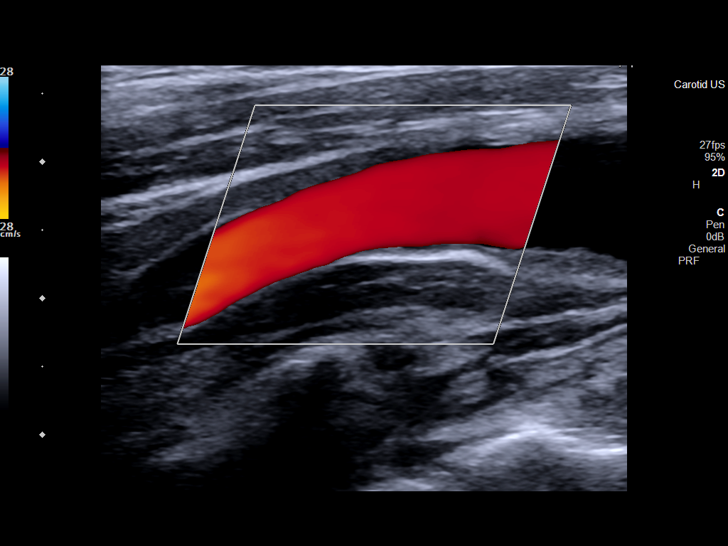
[im 29/67]
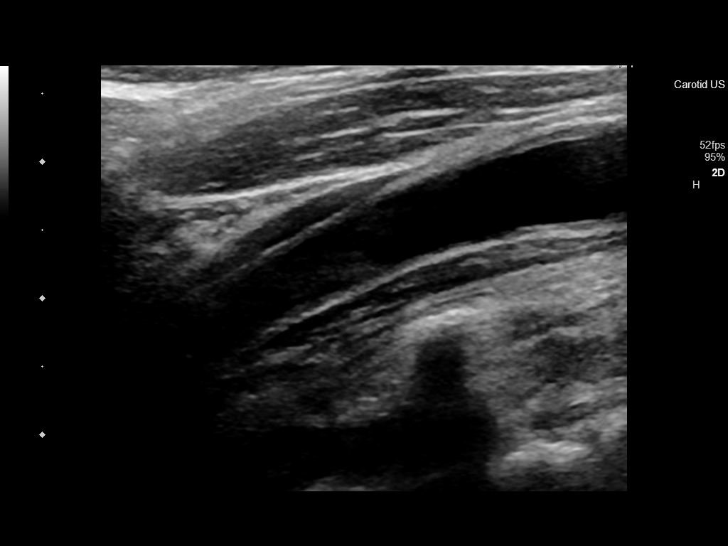
[im 35/67]
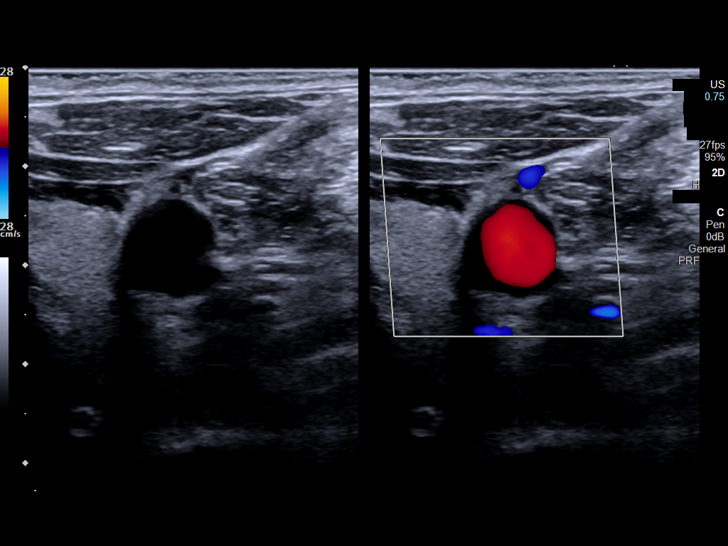
[im 38/67]
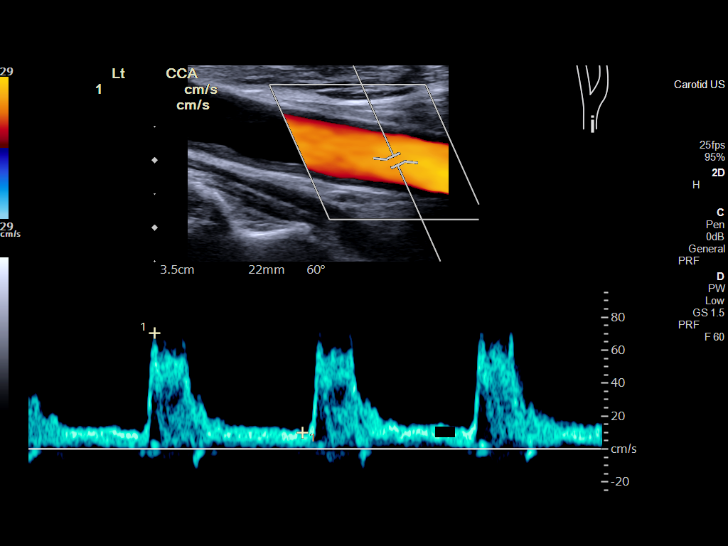
[im 44/67]
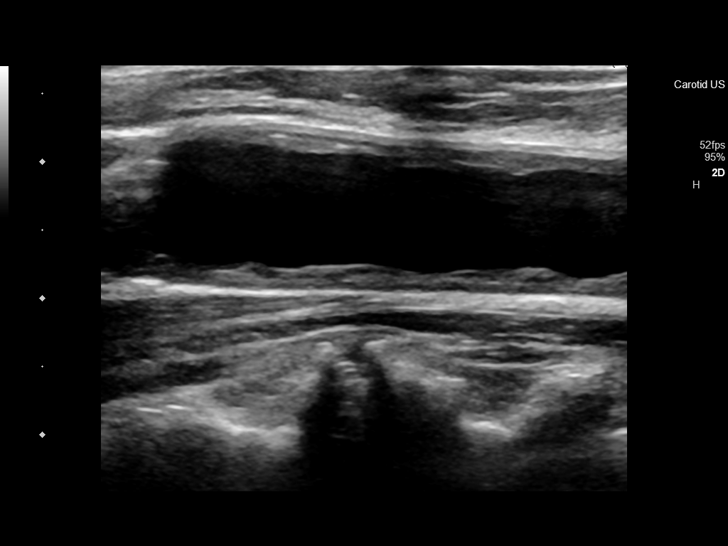
[im 49/67]
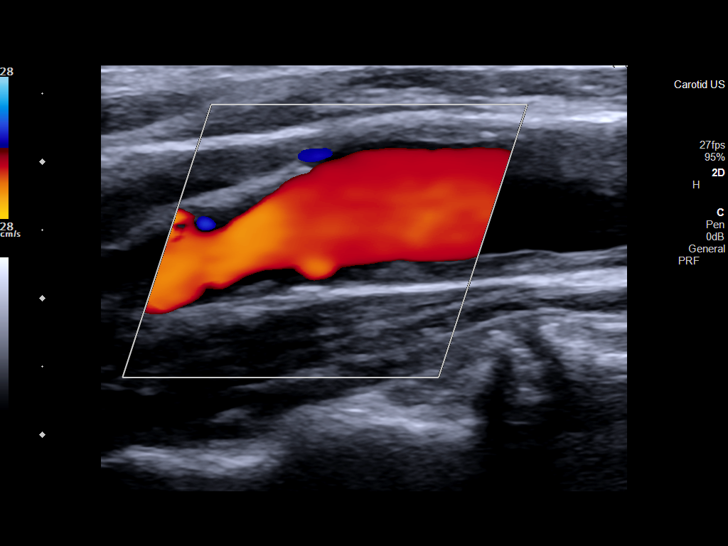
[im 55/67]
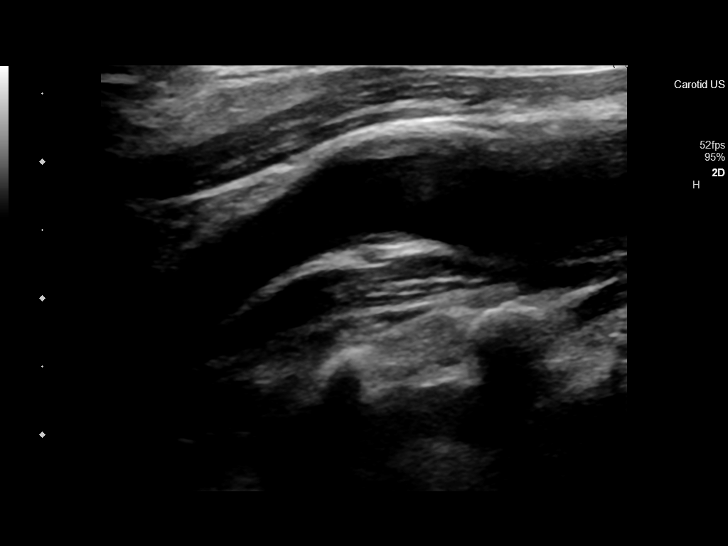
[im 61/67]
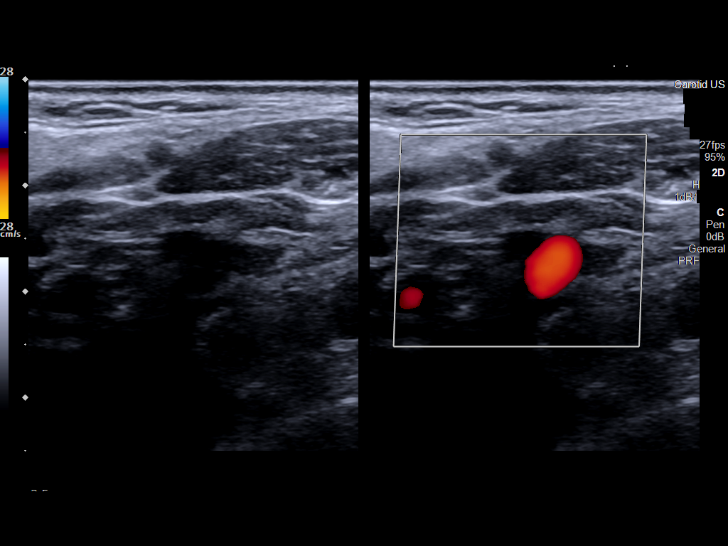
[im 67/67]
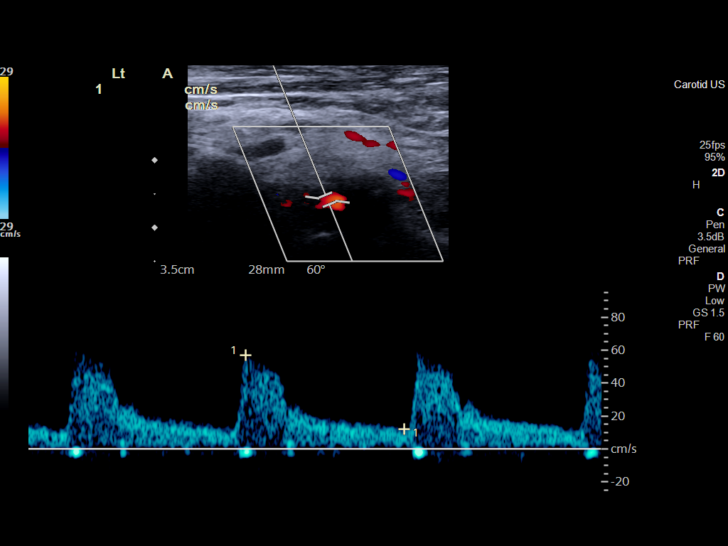

[13 of 24 positions shown; findings below may reference images not displayed]

FINDINGS: Criteria: Quantification of carotid stenosis is based on velocity
parameters that correlate the residual internal carotid diameter
with NASCET-based stenosis levels, using the diameter of the distal
internal carotid lumen as the denominator for stenosis measurement.

The following velocity measurements were obtained:

RIGHT

ICA: Peak systolic velocity 51 cm/sec, End diastolic velocity 14
cm/sec

CCA: Peak systolic velocity 67 cm/sec

SYSTOLIC ICA/CCA RATIO:

ECA: Peak systolic velocity 135 cm/sec

LEFT

ICA: Peak systolic velocity 77 cm/sec, End diastolic velocity 22
cm/sec

CCA: 58 cm/sec

SYSTOLIC ICA/CCA RATIO:

ECA: 85 cm/sec

RIGHT CAROTID ARTERY: Mild multifocal atherosclerotic plaque
formation at the carotid bifurcation. No significant tortuosity.
Normal low resistance waveforms.

RIGHT VERTEBRAL ARTERY:  Antegrade flow.

LEFT CAROTID ARTERY: Mild multifocal atherosclerotic plaque
formation at the carotid bifurcation and bulb. No significant
tortuosity. Normal low resistance waveforms.

LEFT VERTEBRAL ARTERY:  Antegrade flow.

Upper extremity non-invasive blood pressures:

Not obtained.
IMPRESSION: 1. Right carotid artery system: Less than 50% stenosis secondary to
mild atherosclerotic plaque formation.

2. Left carotid artery system: Less than 50% stenosis secondary to
mild atherosclerotic plaque formation.

3.  Vertebral artery system: Patent with antegrade flow bilaterally.

## 2021-03-13 ENCOUNTER — Ambulatory Visit
Admission: RE | Admit: 2021-03-13 | Discharge: 2021-03-13 | Disposition: A | Discharge status: Home / Self Care | Payer: Medicare Other | Source: Ambulatory Visit | Attending: Family Medicine | Admitting: Family Medicine

## 2021-03-13 DIAGNOSIS — R911 Solitary pulmonary nodule: Secondary | ICD-10-CM | POA: Diagnosis not present

## 2021-03-13 DIAGNOSIS — J439 Emphysema, unspecified: Secondary | ICD-10-CM | POA: Diagnosis not present

## 2021-03-13 DIAGNOSIS — R918 Other nonspecific abnormal finding of lung field: Secondary | ICD-10-CM | POA: Diagnosis not present

## 2021-03-23 ENCOUNTER — Other Ambulatory Visit: Payer: Self-pay

## 2021-03-23 ENCOUNTER — Ambulatory Visit (INDEPENDENT_AMBULATORY_CARE_PROVIDER_SITE_OTHER): Payer: Medicare Other | Admitting: Family Medicine

## 2021-03-23 ENCOUNTER — Encounter: Payer: Self-pay | Admitting: Family Medicine

## 2021-03-23 VITALS — BP 136/80 | HR 71 | Temp 97.9°F | Ht 68.5 in | Wt 172.0 lb

## 2021-03-23 DIAGNOSIS — E782 Mixed hyperlipidemia: Secondary | ICD-10-CM

## 2021-03-23 DIAGNOSIS — Z0000 Encounter for general adult medical examination without abnormal findings: Secondary | ICD-10-CM

## 2021-03-23 DIAGNOSIS — Z23 Encounter for immunization: Secondary | ICD-10-CM | POA: Diagnosis not present

## 2021-03-23 DIAGNOSIS — Z7189 Other specified counseling: Secondary | ICD-10-CM

## 2021-03-23 DIAGNOSIS — R413 Other amnesia: Secondary | ICD-10-CM

## 2021-03-23 DIAGNOSIS — I10 Essential (primary) hypertension: Secondary | ICD-10-CM

## 2021-03-23 DIAGNOSIS — Z952 Presence of prosthetic heart valve: Secondary | ICD-10-CM

## 2021-03-23 DIAGNOSIS — Z79899 Other long term (current) drug therapy: Secondary | ICD-10-CM

## 2021-03-23 DIAGNOSIS — L400 Psoriasis vulgaris: Secondary | ICD-10-CM | POA: Insufficient documentation

## 2021-03-23 LAB — COMPREHENSIVE METABOLIC PANEL
ALT: 20 U/L (ref 0–53)
AST: 21 U/L (ref 0–37)
Albumin: 4.6 g/dL (ref 3.5–5.2)
Alkaline Phosphatase: 77 U/L (ref 39–117)
BUN: 21 mg/dL (ref 6–23)
CO2: 26 mEq/L (ref 19–32)
Calcium: 10 mg/dL (ref 8.4–10.5)
Chloride: 100 mEq/L (ref 96–112)
Creatinine, Ser: 1.05 mg/dL (ref 0.40–1.50)
GFR: 69.54 mL/min (ref >60.00)
Glucose, Bld: 107 mg/dL — ABNORMAL HIGH (ref 70–99)
Potassium: 4.7 mEq/L (ref 3.5–5.1)
Sodium: 134 mEq/L — ABNORMAL LOW (ref 135–145)
Total Bilirubin: 0.7 mg/dL (ref 0.2–1.2)
Total Protein: 7.1 g/dL (ref 6.0–8.3)

## 2021-03-23 LAB — LIPID PANEL
Cholesterol: 144 mg/dL (ref 0–200)
HDL: 55.4 mg/dL (ref >39.00)
LDL Cholesterol: 66 mg/dL (ref 0–99)
NonHDL: 88.34
Total CHOL/HDL Ratio: 3
Triglycerides: 113 mg/dL (ref 0.0–149.0)
VLDL: 22.6 mg/dL (ref 0.0–40.0)

## 2021-03-23 LAB — CBC WITH DIFFERENTIAL/PLATELET
Basophils Absolute: 0.1 10*3/uL (ref 0.0–0.1)
Basophils Relative: 1.1 % (ref 0.0–3.0)
Eosinophils Absolute: 0.1 10*3/uL (ref 0.0–0.7)
Eosinophils Relative: 1.9 % (ref 0.0–5.0)
HCT: 40.6 % (ref 39.0–52.0)
Hemoglobin: 13.7 g/dL (ref 13.0–17.0)
Lymphocytes Relative: 19.9 % (ref 12.0–46.0)
Lymphs Abs: 0.9 10*3/uL (ref 0.7–4.0)
MCHC: 33.8 g/dL (ref 30.0–36.0)
MCV: 95 fl (ref 78.0–100.0)
Monocytes Absolute: 0.6 10*3/uL (ref 0.1–1.0)
Monocytes Relative: 12.4 % — ABNORMAL HIGH (ref 3.0–12.0)
Neutro Abs: 2.9 10*3/uL (ref 1.4–7.7)
Neutrophils Relative %: 64.7 % (ref 43.0–77.0)
Platelets: 289 10*3/uL (ref 150.0–400.0)
RBC: 4.27 Mil/uL (ref 4.22–5.81)
RDW: 14.6 % (ref 11.5–15.5)
WBC: 4.5 10*3/uL (ref 4.0–10.5)

## 2021-03-23 LAB — VITAMIN B12: Vitamin B-12: 260 pg/mL (ref 211–911)

## 2021-03-23 LAB — TSH: TSH: 2.42 u[IU]/mL (ref 0.35–5.50)

## 2021-03-23 MED ORDER — SPIRONOLACTONE 25 MG PO TABS: 12.5000 mg | ORAL_TABLET | ORAL | Status: DC

## 2021-03-23 NOTE — Progress Notes (Unsigned)
This visit occurred during the SARS-CoV-2 public health emergency.  Safety protocols were in place, including screening questions prior to the visit, additional usage of staff PPE, and extensive cleaning of exam room while observing appropriate contact time as indicated for disinfecting solutions.  I have personally reviewed the Medicare Annual Wellness questionnaire and have noted 1. The patient's medical and social history 2. Their use of alcohol, tobacco or illicit drugs 3. Their current medications and supplements 4. The patient's functional ability including ADL's, fall risks, home safety risks and hearing or visual             impairment. 5. Diet and physical activities 6. Evidence for depression or mood disorders  The patients weight, height, BMI have been recorded in the chart and visual acuity is per eye clinic.  I have made referrals, counseling and provided education to the patient based review of the above and I have provided the pt with a written personalized care plan for preventive services.  Provider list updated- see scanned forms.  Routine anticipatory guidance given to patient.  See health maintenance. The possibility exists that previously documented standard health maintenance information may have been brought forward from a previous encounter into this note.  If needed, that same information has been updated to reflect the current situation based on today's encounter.    Flu Shingles PNA Tetanus Colon  Breast cancer screening Prostate cancer screening Advance directive Cognitive function addressed- see scanned forms- and if abnormal then additional documentation follows.   In addition to [facility], follow up visit for the below conditions:  Hypertension:    Using medication without problems or lightheadedness: yes Chest pain with exertion:no Edema: no Short of breath: 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   He has sig nocturia with  spironolactone.  D/w pt about trial with med every other day.  He had cough on losartan, improved off med.    Elevated Cholesterol: Using medications without problems: yes Muscle aches:  some aches in the arms and legs.   Diet compliance: yes Exercise: yes  Hard stools since colonoscopy.  D/w pt about trying a probiotic.    Rash improved with MTX, failed taper and is back on 10mg  per week.    Memory changes d/w pt.  Unclear if this is sleep related, see above.  Sleep disrupted from nocturia.  D/w pt about checking routine labs today.  Some occ short term changes.    PMH and SH reviewed  Meds, vitals, and allergies reviewed.   ROS: Per HPI.  Unless specifically indicated otherwise in HPI, the patient denies:  General: fever. Eyes: acute vision changes ENT: sore throat Cardiovascular: chest pain Respiratory: SOB GI: vomiting GU: dysuria Musculoskeletal: acute back pain Derm: acute rash Neuro: acute motor dysfunction Psych: worsening mood Endocrine: polydipsia Heme: bleeding Allergy: hayfever  GEN: nad, alert and oriented HEENT: ncat NECK: supple w/o LA CV: rrr. PULM: ctab, no inc wob ABD: soft, +bs EXT: no edema SKIN: no acute rash

## 2021-03-23 NOTE — Patient Instructions (Addendum)
Try taking spironolactone every other day.  Update me in about 2 weeks, sooner if needed, about urinary symptoms and BP.  ?Take care.  Glad to see you. ? ?I would stop the atorvastatin and see if the aches are better.  Either way, let me know in about 2 weeks.   ? ?Try a probiotic and see if that helps.   ?Go to the lab on the way out.   If you have mychart we'll likely use that to update you.    ?

## 2021-03-25 DIAGNOSIS — R413 Other amnesia: Secondary | ICD-10-CM | POA: Insufficient documentation

## 2021-03-25 NOTE — Assessment & Plan Note (Addendum)
Flu done ?Shingles discussed with patient, previously had Zostavax. ?PNA up-to-date ?Tetanus 2016 ?COVID-vaccine previously done ?Colonoscopy 2023 ?Prostate cancer screening per urology.  I will defer.  He agrees ?Advance directive- wife designated if patient were incapacitated ?Cognitive function addressed- see scanned forms- and if abnormal then additional documentation follows.  ?

## 2021-03-25 NOTE — Assessment & Plan Note (Signed)
With aches possibly related to statin use.  Discussed options ?I would stop the atorvastatin and see if the aches are better.  Either way, he will me know in about 2 weeks.   ?

## 2021-03-25 NOTE — Assessment & Plan Note (Signed)
Memory changes d/w pt.  Unclear if this is sleep related, see above.  Sleep disrupted from nocturia.  D/w pt about checking routine labs today.  Some occ short term changes.  If his labs are unremarkable, we can see if cutting back on spironolactone helps with nocturia and sleep fragmentation.  He can let me know if his memory is not improving at that point. ?

## 2021-03-25 NOTE — Assessment & Plan Note (Signed)
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   ?He has sig nocturia with spironolactone.  D/w pt about trial with med every other day.  He had cough on losartan, improved off med.   Continue amlodipine.  He will let me know if he does not improve with cutting back on spironolactone. ?

## 2021-03-25 NOTE — Assessment & Plan Note (Signed)
Advance directive- wife designated if patient were incapacitated.  

## 2021-03-25 NOTE — Assessment & Plan Note (Signed)
Per outside clinic on methotrexate.  I will defer.  He agrees with plan. ?

## 2021-03-27 ENCOUNTER — Telehealth: Payer: Self-pay

## 2021-03-27 NOTE — Telephone Encounter (Signed)
Patient advised of results.

## 2021-03-27 NOTE — Telephone Encounter (Signed)
Patient is returning missed call for results. Please advise

## 2021-04-09 ENCOUNTER — Telehealth: Payer: Self-pay | Admitting: Family Medicine

## 2021-04-09 NOTE — Telephone Encounter (Signed)
Pt called Stating that Dr Damita Dunnings stated for him to let him know about the medication changes he made. Pt would like a call back to discuss. Please advise. ?

## 2021-04-09 NOTE — Telephone Encounter (Signed)
Called and spoke with patient and he states he has been off of the atorvastatin 2 weeks and leg cramps are much better. He has been on the lower dose of spironolactone but that has not really made any difference. States he still gets up the same amount of time to go to the bathroom.   ?

## 2021-04-10 ENCOUNTER — Other Ambulatory Visit: Payer: Self-pay | Admitting: Family Medicine

## 2021-04-10 MED ORDER — ATORVASTATIN CALCIUM 40 MG PO TABS: 20.0000 mg | ORAL_TABLET | Freq: Every day | ORAL | Status: DC

## 2021-04-10 NOTE — Addendum Note (Signed)
Addended by: Tonia Ghent on: 04/10/2021 02:04 PM ? ? Modules accepted: Orders ? ?

## 2021-04-10 NOTE — Telephone Encounter (Signed)
Called patient and discussed mediacations with him; patient verbalized understanding and will try. Patient states he does not want to go back to the urologist as he has already seen them for this. Patient states he discussed this with you already and wants to stop the spironolactone; he has stopped this before and the problem went away.  ?

## 2021-04-10 NOTE — Telephone Encounter (Signed)
Pt called back and Dr. Carole Civil message was relayed to pt. Pt stated understanding and has already changed the statin.  ?

## 2021-04-10 NOTE — Telephone Encounter (Signed)
lmtcb

## 2021-04-10 NOTE — Telephone Encounter (Signed)
I would cut the atorvastatin in half, down to 20mg .  See if he can restart that MWF w/o aches.  If tolerated, then gradually increase to daily.  If he can't tolerate that and has more aches, then let let know so we can change meds.   ? ?Please have him check with urology about nocturia.   ? ?Thanks.   ?

## 2021-04-10 NOTE — Telephone Encounter (Signed)
Duly noted, then stop the spironolactone and please have him check his BP.  Update me if BP isn't controlled or if sx don't get better.  And I would continue as planned with the statin change.  Thanks.  ?

## 2021-04-13 ENCOUNTER — Telehealth: Payer: Self-pay | Admitting: *Deleted

## 2021-04-13 MED ORDER — AMLODIPINE BESYLATE 10 MG PO TABS
5.0000 mg | ORAL_TABLET | Freq: Every day | ORAL | Status: DC
Start: 2021-04-13 — End: 2022-03-01

## 2021-04-13 NOTE — Telephone Encounter (Signed)
Pt called back to f/u from his last OV. PCP advised him to take fluid pill every other day and update [place]. Pt said since decreasing BP med his BP has actually dropped even lower, the last 2 days it was 113/60 and 111/62, pt said he is also feeling a little dizzy/ light headed and his vision becomes a little blurry when his PB is dropping that low. Pt wanted to know if he should keep taking med or what he should do ?

## 2021-04-13 NOTE — Telephone Encounter (Signed)
Patient is off the spironolactone; advised to cut amlodipine in half to take 5 mg QD. Patient verbalized understanding and will call back in 1 week about BP. ?

## 2021-04-13 NOTE — Telephone Encounter (Signed)
If he has totally stopped spironolactone and still has lower BP, then I would cut amlodipine back to 5mg  a day.  He can cut the 10mg  tabs in half. Please update me in about 1 week about his BP.  Drink enough fluid to keep his urine clear.  Thanks.   ?

## 2021-05-04 DIAGNOSIS — L820 Inflamed seborrheic keratosis: Secondary | ICD-10-CM | POA: Diagnosis not present

## 2021-05-04 DIAGNOSIS — Z79899 Other long term (current) drug therapy: Secondary | ICD-10-CM | POA: Diagnosis not present

## 2021-05-04 DIAGNOSIS — Z85828 Personal history of other malignant neoplasm of skin: Secondary | ICD-10-CM | POA: Diagnosis not present

## 2021-05-04 DIAGNOSIS — D2271 Melanocytic nevi of right lower limb, including hip: Secondary | ICD-10-CM | POA: Diagnosis not present

## 2021-05-04 DIAGNOSIS — L821 Other seborrheic keratosis: Secondary | ICD-10-CM | POA: Diagnosis not present

## 2021-05-04 DIAGNOSIS — L538 Other specified erythematous conditions: Secondary | ICD-10-CM | POA: Diagnosis not present

## 2021-05-04 DIAGNOSIS — Z85820 Personal history of malignant melanoma of skin: Secondary | ICD-10-CM | POA: Diagnosis not present

## 2021-05-04 DIAGNOSIS — D2261 Melanocytic nevi of right upper limb, including shoulder: Secondary | ICD-10-CM | POA: Diagnosis not present

## 2021-05-12 DIAGNOSIS — Z79899 Other long term (current) drug therapy: Secondary | ICD-10-CM | POA: Diagnosis not present

## 2021-05-28 DIAGNOSIS — H6123 Impacted cerumen, bilateral: Secondary | ICD-10-CM | POA: Diagnosis not present

## 2021-06-23 DIAGNOSIS — M4306 Spondylolysis, lumbar region: Secondary | ICD-10-CM | POA: Diagnosis not present

## 2021-06-23 DIAGNOSIS — M9903 Segmental and somatic dysfunction of lumbar region: Secondary | ICD-10-CM | POA: Diagnosis not present

## 2021-06-23 DIAGNOSIS — M545 Low back pain, unspecified: Secondary | ICD-10-CM | POA: Diagnosis not present

## 2021-06-23 DIAGNOSIS — M9905 Segmental and somatic dysfunction of pelvic region: Secondary | ICD-10-CM | POA: Diagnosis not present

## 2021-06-25 DIAGNOSIS — M9905 Segmental and somatic dysfunction of pelvic region: Secondary | ICD-10-CM | POA: Diagnosis not present

## 2021-06-25 DIAGNOSIS — M4306 Spondylolysis, lumbar region: Secondary | ICD-10-CM | POA: Diagnosis not present

## 2021-06-25 DIAGNOSIS — M9903 Segmental and somatic dysfunction of lumbar region: Secondary | ICD-10-CM | POA: Diagnosis not present

## 2021-06-25 DIAGNOSIS — M545 Low back pain, unspecified: Secondary | ICD-10-CM | POA: Diagnosis not present

## 2021-06-29 DIAGNOSIS — M9903 Segmental and somatic dysfunction of lumbar region: Secondary | ICD-10-CM | POA: Diagnosis not present

## 2021-06-29 DIAGNOSIS — M9905 Segmental and somatic dysfunction of pelvic region: Secondary | ICD-10-CM | POA: Diagnosis not present

## 2021-06-29 DIAGNOSIS — M4306 Spondylolysis, lumbar region: Secondary | ICD-10-CM | POA: Diagnosis not present

## 2021-06-29 DIAGNOSIS — M545 Low back pain, unspecified: Secondary | ICD-10-CM | POA: Diagnosis not present

## 2021-07-01 DIAGNOSIS — M4306 Spondylolysis, lumbar region: Secondary | ICD-10-CM | POA: Diagnosis not present

## 2021-07-01 DIAGNOSIS — M9905 Segmental and somatic dysfunction of pelvic region: Secondary | ICD-10-CM | POA: Diagnosis not present

## 2021-07-01 DIAGNOSIS — M545 Low back pain, unspecified: Secondary | ICD-10-CM | POA: Diagnosis not present

## 2021-07-01 DIAGNOSIS — M9903 Segmental and somatic dysfunction of lumbar region: Secondary | ICD-10-CM | POA: Diagnosis not present

## 2021-07-03 DIAGNOSIS — M545 Low back pain, unspecified: Secondary | ICD-10-CM | POA: Diagnosis not present

## 2021-07-03 DIAGNOSIS — M9903 Segmental and somatic dysfunction of lumbar region: Secondary | ICD-10-CM | POA: Diagnosis not present

## 2021-07-03 DIAGNOSIS — M9905 Segmental and somatic dysfunction of pelvic region: Secondary | ICD-10-CM | POA: Diagnosis not present

## 2021-07-03 DIAGNOSIS — M4306 Spondylolysis, lumbar region: Secondary | ICD-10-CM | POA: Diagnosis not present

## 2021-07-06 DIAGNOSIS — M545 Low back pain, unspecified: Secondary | ICD-10-CM | POA: Diagnosis not present

## 2021-07-06 DIAGNOSIS — M9903 Segmental and somatic dysfunction of lumbar region: Secondary | ICD-10-CM | POA: Diagnosis not present

## 2021-07-06 DIAGNOSIS — M9905 Segmental and somatic dysfunction of pelvic region: Secondary | ICD-10-CM | POA: Diagnosis not present

## 2021-07-06 DIAGNOSIS — M4306 Spondylolysis, lumbar region: Secondary | ICD-10-CM | POA: Diagnosis not present

## 2021-07-08 DIAGNOSIS — M545 Low back pain, unspecified: Secondary | ICD-10-CM | POA: Diagnosis not present

## 2021-07-08 DIAGNOSIS — M4306 Spondylolysis, lumbar region: Secondary | ICD-10-CM | POA: Diagnosis not present

## 2021-07-08 DIAGNOSIS — M9903 Segmental and somatic dysfunction of lumbar region: Secondary | ICD-10-CM | POA: Diagnosis not present

## 2021-07-08 DIAGNOSIS — M9905 Segmental and somatic dysfunction of pelvic region: Secondary | ICD-10-CM | POA: Diagnosis not present

## 2021-07-13 DIAGNOSIS — M9903 Segmental and somatic dysfunction of lumbar region: Secondary | ICD-10-CM | POA: Diagnosis not present

## 2021-07-13 DIAGNOSIS — M545 Low back pain, unspecified: Secondary | ICD-10-CM | POA: Diagnosis not present

## 2021-07-13 DIAGNOSIS — M9905 Segmental and somatic dysfunction of pelvic region: Secondary | ICD-10-CM | POA: Diagnosis not present

## 2021-07-13 DIAGNOSIS — M4306 Spondylolysis, lumbar region: Secondary | ICD-10-CM | POA: Diagnosis not present

## 2021-07-15 DIAGNOSIS — M545 Low back pain, unspecified: Secondary | ICD-10-CM | POA: Diagnosis not present

## 2021-07-15 DIAGNOSIS — M9905 Segmental and somatic dysfunction of pelvic region: Secondary | ICD-10-CM | POA: Diagnosis not present

## 2021-07-15 DIAGNOSIS — M9903 Segmental and somatic dysfunction of lumbar region: Secondary | ICD-10-CM | POA: Diagnosis not present

## 2021-07-15 DIAGNOSIS — M4306 Spondylolysis, lumbar region: Secondary | ICD-10-CM | POA: Diagnosis not present

## 2021-07-21 DIAGNOSIS — M4306 Spondylolysis, lumbar region: Secondary | ICD-10-CM | POA: Diagnosis not present

## 2021-07-21 DIAGNOSIS — M9905 Segmental and somatic dysfunction of pelvic region: Secondary | ICD-10-CM | POA: Diagnosis not present

## 2021-07-21 DIAGNOSIS — M545 Low back pain, unspecified: Secondary | ICD-10-CM | POA: Diagnosis not present

## 2021-07-21 DIAGNOSIS — M9903 Segmental and somatic dysfunction of lumbar region: Secondary | ICD-10-CM | POA: Diagnosis not present

## 2021-07-28 DIAGNOSIS — M9903 Segmental and somatic dysfunction of lumbar region: Secondary | ICD-10-CM | POA: Diagnosis not present

## 2021-07-28 DIAGNOSIS — M4306 Spondylolysis, lumbar region: Secondary | ICD-10-CM | POA: Diagnosis not present

## 2021-07-28 DIAGNOSIS — M545 Low back pain, unspecified: Secondary | ICD-10-CM | POA: Diagnosis not present

## 2021-07-28 DIAGNOSIS — M9905 Segmental and somatic dysfunction of pelvic region: Secondary | ICD-10-CM | POA: Diagnosis not present

## 2021-08-11 DIAGNOSIS — M545 Low back pain, unspecified: Secondary | ICD-10-CM | POA: Diagnosis not present

## 2021-08-11 DIAGNOSIS — M4306 Spondylolysis, lumbar region: Secondary | ICD-10-CM | POA: Diagnosis not present

## 2021-08-11 DIAGNOSIS — M9905 Segmental and somatic dysfunction of pelvic region: Secondary | ICD-10-CM | POA: Diagnosis not present

## 2021-08-11 DIAGNOSIS — M9903 Segmental and somatic dysfunction of lumbar region: Secondary | ICD-10-CM | POA: Diagnosis not present

## 2021-09-01 DIAGNOSIS — M545 Low back pain, unspecified: Secondary | ICD-10-CM | POA: Diagnosis not present

## 2021-09-01 DIAGNOSIS — M4306 Spondylolysis, lumbar region: Secondary | ICD-10-CM | POA: Diagnosis not present

## 2021-09-01 DIAGNOSIS — M9903 Segmental and somatic dysfunction of lumbar region: Secondary | ICD-10-CM | POA: Diagnosis not present

## 2021-09-01 DIAGNOSIS — M9905 Segmental and somatic dysfunction of pelvic region: Secondary | ICD-10-CM | POA: Diagnosis not present

## 2021-09-09 IMAGING — CT CT ABD-PEL WO/W CM
2 of 13 series · 10 of 46 positions shown, 16 images · IV contrast (omnipaque)
Comparison: CT abdomen pelvis, 02/21/2019

CLINICAL DATA: Microscopic hematuria, kidney mass

EXAM:
CT ABDOMEN AND PELVIS WITHOUT AND WITH CONTRAST
TECHNIQUE: Multidetector CT imaging of the abdomen and pelvis was performed
following the standard protocol before and following the bolus
administration of intravenous contrast.
CONTRAST:  100mL OMNIPAQUE IOHEXOL 300 MG/ML  SOLN

[Series 4: coronal without renal without 2.00 cor · coronal · non-contrast · 0.49mm/px · 2 of 136 slices shown, 3 images]
[im 46/136  soft-tissue]
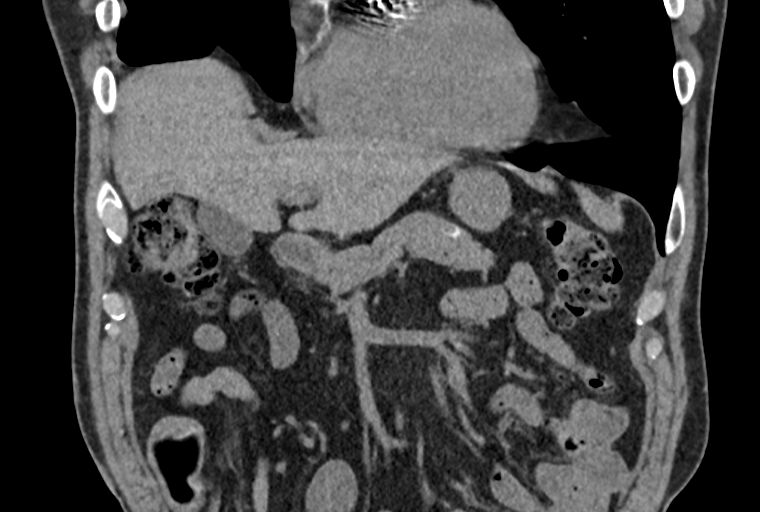
[im 46/136  bone]
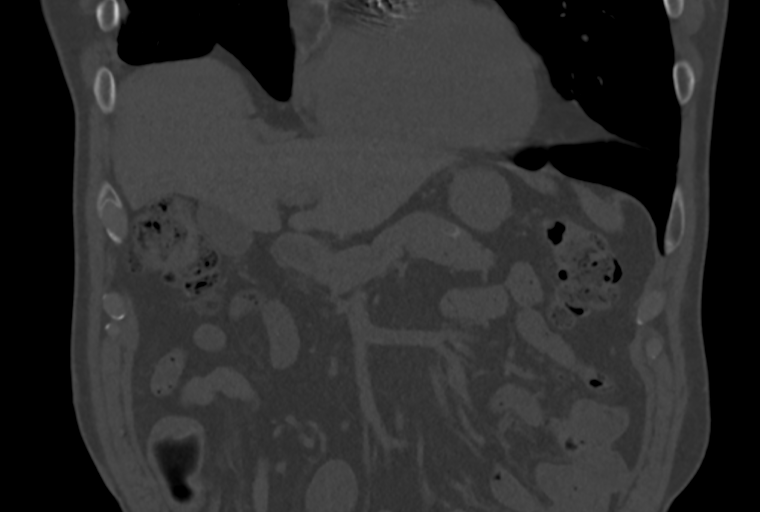
[im 91/136  soft-tissue]
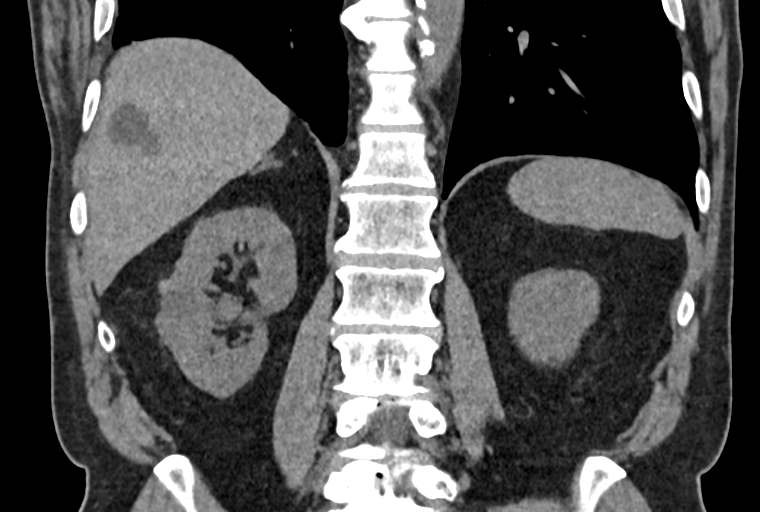

[Series 15: axial nephrographic renal nephrographic 2.00 · axial · 0.77mm/px · z∈[-1511,-1133]mm · 8 of 245 slices shown, 13 images]
[im 28/245  soft-tissue]
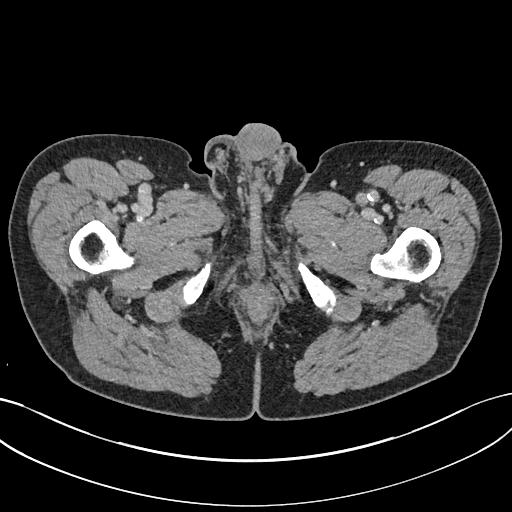
[im 28/245  bone]
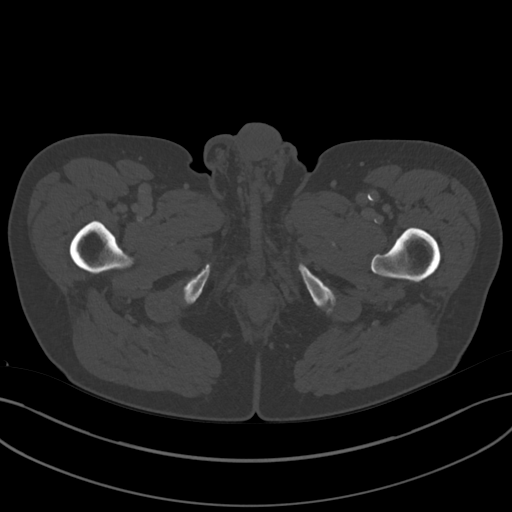
[im 55/245  soft-tissue]
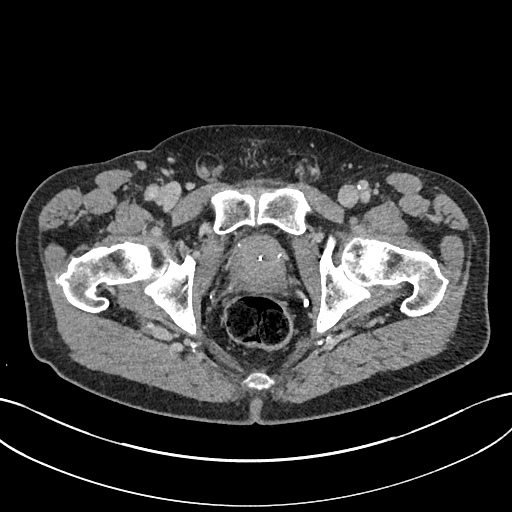
[im 82/245  soft-tissue]
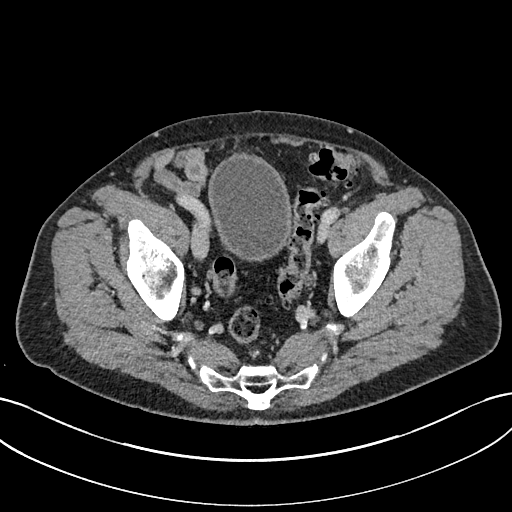
[im 109/245  soft-tissue]
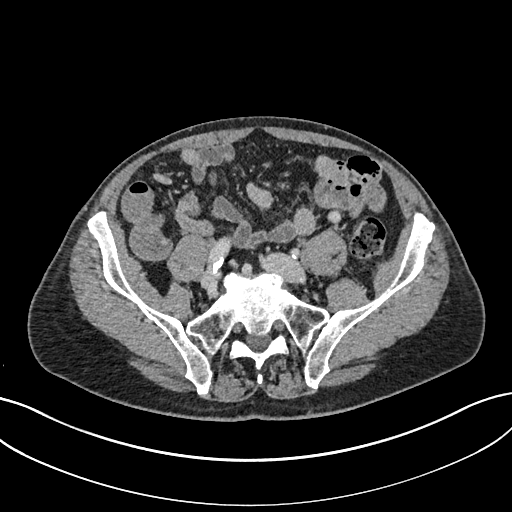
[im 136/245  soft-tissue]
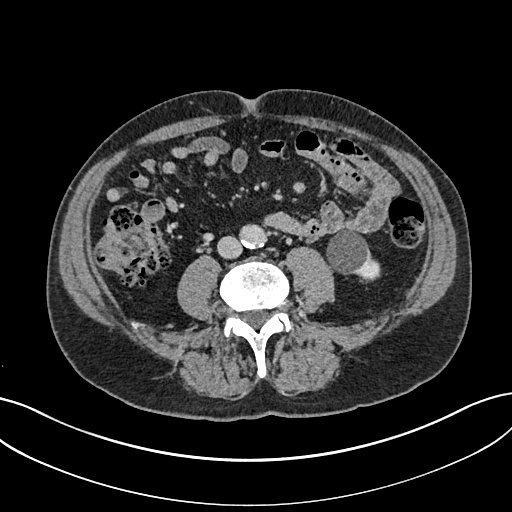
[im 136/245  lung]
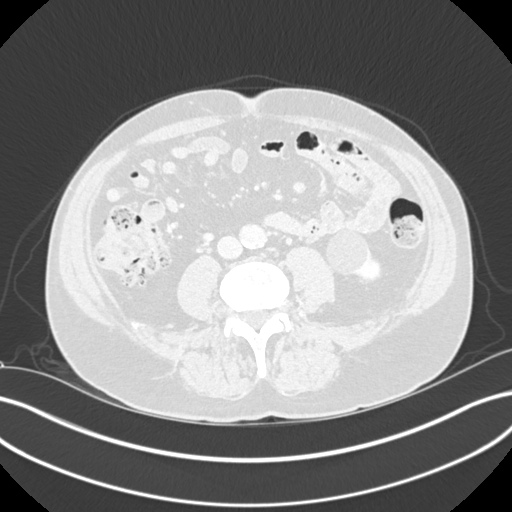
[im 163/245  soft-tissue]
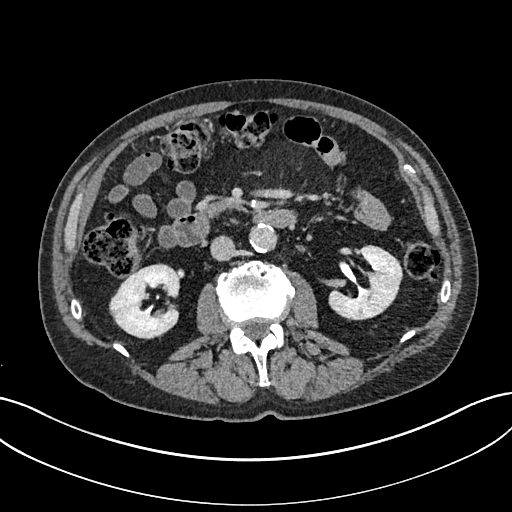
[im 163/245  lung]
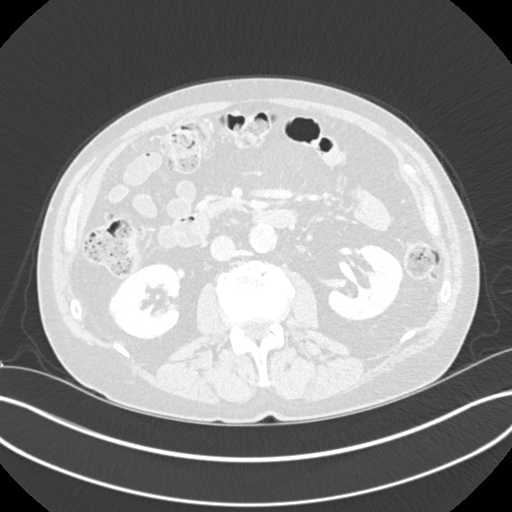
[im 190/245  soft-tissue]
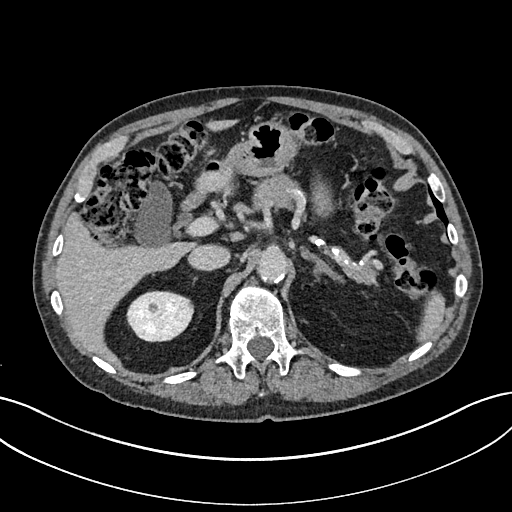
[im 190/245  lung]
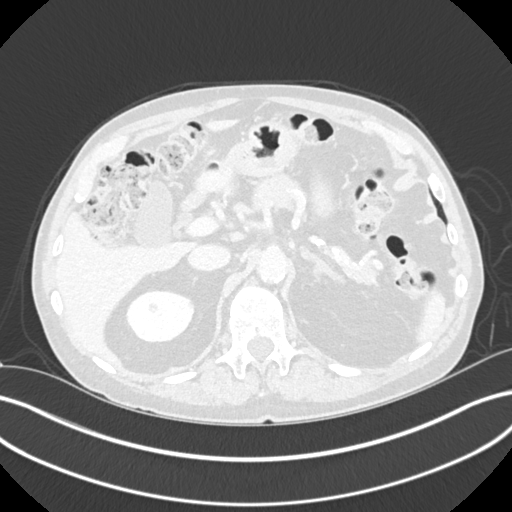
[im 217/245  soft-tissue]
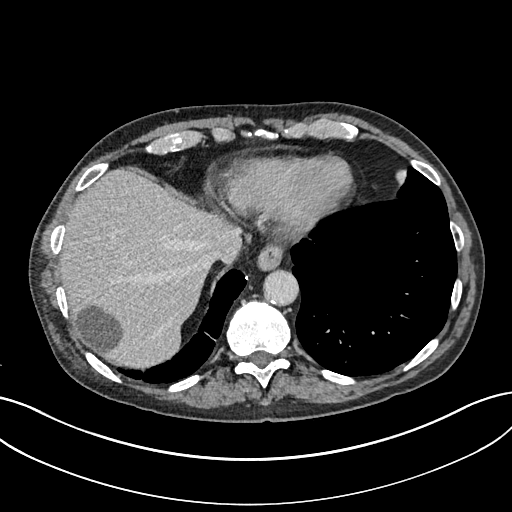
[im 217/245  lung]
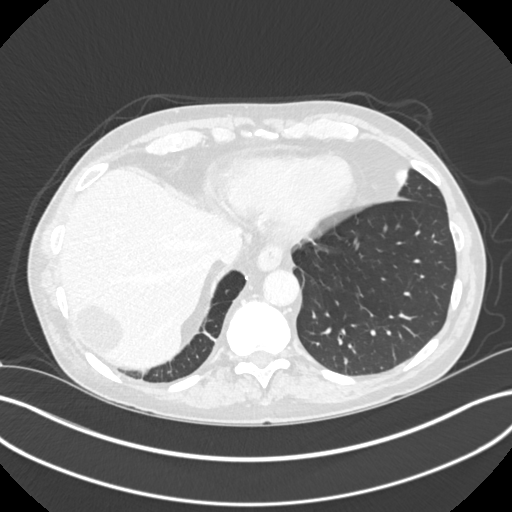

[10 of 46 positions shown; findings below may reference images not displayed]

FINDINGS: Lower chest: No acute abnormality.  Aortic valve stent endograft.

Hepatobiliary: No solid liver abnormality is seen. Fluid attenuation
cyst of the lateral liver dome. No gallstones, gallbladder wall
thickening, or biliary dilatation.

Pancreas: Unremarkable. No pancreatic ductal dilatation or
surrounding inflammatory changes.

Spleen: Normal in size without significant abnormality.

Adrenals/Urinary Tract: Adrenal glands are unremarkable. There is an
exophytic fluid attenuation cyst of the inferior pole of the left
kidney measuring 3.5 cm (series 8, image 118). Additional small
bilateral renal lesions, many too small to characterize although
almost certainly additional cysts. There is a subcentimeter
hyperdense, nonenhancing hemorrhagic or proteinaceous cyst of the
lateral midportion of the right kidney (series 2, image 71). No
suspicious solid mass or contrast enhancement. Calculi or
hydronephrosis. Limited opacification of the distal ureters. Within
this limitation, no evidence of upper urinary tract filling defect
on delayed phase imaging. Bladder is unremarkable.

Stomach/Bowel: Stomach is within normal limits. Appendix appears
normal. No evidence of bowel wall thickening, distention, or
inflammatory changes.

Vascular/Lymphatic: Aortic atherosclerosis. No enlarged abdominal or
pelvic lymph nodes.

Reproductive: Prostatomegaly.  Urolift implants.

Other: No abdominal wall hernia or abnormality. No abdominopelvic
ascites.

Musculoskeletal: No acute or significant osseous findings.
IMPRESSION: 1. Bilateral renal cysts without evidence of solid mass or
suspicious contrast enhancement. No evidence of urinary tract
calculus, filling defect, or hydronephrosis.
2. Prostatomegaly. Urolift implants.

Aortic Atherosclerosis ([id number]).

## 2021-09-27 NOTE — Progress Notes (Unsigned)
Cardiology Office Note  Date:  09/28/2021   ID:  Brett Mcguire, DOB 05/07/1945, MRN [id number]  PCP:  Brett Ghent, MD   Chief Complaint  Patient presents with   12 month follow up     Patient c/o shortness of breath with over exertion. Medications reviewed by the patient verbally.     HPI:  Brett Mcguire is a 76 year old gentleman with prior history of  smoking for 17 years, 1983 17 years of secondhand exposure per the patient, lung cancer with resection of the right upper lobe in 1989,  quit smoking in 1984,  chronic mild shortness of breath  who presents for follow-up of his  aortic valve stenosis , TAVR  Last seen in the clinic 09/2020  On today's visit is not taking losartan (he does not remember being on this), not on spironolactone (polyuria)  Episode of dizziness yesterday, reports blood pressure was 120/60 Has been taking amlodipine 5 mg daily Think he was dehydrated, drinks plenty of fluids  Continues to have chronic shortness of breath, SOB on hills No stamina on long walk such as walking the driveway  Long discussion, missing right upper lobe of his lung, prior long smoking history Feels smoking is a contributor,  shortness of breath getting worse as he gets older  Denies any chest pain concerning for angina Prior cardiac catheterization several years ago with no significant coronary disease  EKG personally reviewed by myself on todays visit Normal sinus rhythm rate 64 bpm no significant ST-T wave changes  Testing reviewed Echo 9/22  1. Left ventricular ejection fraction, by estimation, is 60 to 65%. The  left ventricle has normal function. The left ventricle has no regional  wall motion abnormalities. There is moderate left ventricular hypertrophy.  Left ventricular diastolic  parameters were normal.   2. Right ventricular systolic function is normal. The right ventricular  size is normal.   3. Left atrial size was mildly dilated.   4. The mitral  valve is normal in structure. No evidence of mitral valve  regurgitation. No evidence of mitral stenosis.   5. Post TAVR 10/10/19 with 26 mm Sapien 3 valve No signficant PVL mean  gradient 10 peak 21 mmHg AVA 2.7 cm2 DVI 0.75 normal function Gradients  actually lower than those obtained on TTE 10/03/19. The aortic valve has  been repaired/replaced. Aortic valve  regurgitation is not visualized. No aortic stenosis is present. There is a  26 mm Sapien prosthetic (TAVR) valve present in the aortic position.  Procedure Date: 10/10/19.   6. The inferior vena cava is normal in size with greater than 50%  respiratory variability, suggesting right atrial pressure of 3 mmHg.   Constipated.  Again wonders if it is from losartan  Pressure at home 130/70 Currently on losartan 100 with spironolactone 12.5  Was previously on amlodipine, had no side effects Prior side effects on Cardura, on his allergy list  Continues to work with dog boarding  EKG personally reviewed by myself on todays visit NSR rate 68 bpm no ST or T wave changes  Echo reviewed with him on today's visit  1. Left ventricular ejection fraction, by estimation, is 60 to 65%. The  left ventricle has normal function. The left ventricle has no regional  wall motion abnormalities. There is moderate left ventricular hypertrophy.  Left ventricular diastolic  parameters were normal.   2. Right ventricular systolic function is normal. The right ventricular  size is normal.   3. Left atrial size  was mildly dilated.   4. The mitral valve is normal in structure. No evidence of mitral valve  regurgitation. No evidence of mitral stenosis.   5. Post TAVR 10/10/19 with 26 mm Sapien 3 valve No signficant PVL mean  gradient 10 peak 21 mmHg AVA 2.7 cm2 DVI 0.75 normal function Gradients  actually lower than those obtained on TTE 10/03/19. The aortic valve has  been repaired/replaced. Aortic valve  regurgitation is not visualized. No aortic stenosis  is present. There is a  26 mm Sapien prosthetic (TAVR) valve present in the aortic position.  Procedure Date: 10/10/19.   6. The inferior vena cava is normal in size with greater than 50%  respiratory variability, suggesting right atrial pressure of 3 mmHg.   hospital 10/28-10/29/21 for TIA.  Echo showed EF 60%, normally functioning TAVR with a mean gradient of 9 mmHg and no PVL. Bubble study was also performed which was negative.  Reported compliance with his aspirin Plavix at the time  Prior records reviewed Echo in 12/2014 Possibly bicuspid; moderately thickened, mildly calcified leaflets. There was mild to moderate stenosis. There was trivial regurgitation. Peak velocity 311, Peak gradient (S): 39 mm Hg.   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  PSH:    Past Surgical History:  Procedure Laterality Date   COLONOSCOPY  02/18/2000   Polyp, diverticulosis, repeat in 5 years   COLONOSCOPY  03/03/2006   Repeat  -  Divertics   COLONOSCOPY WITH PROPOFOL N/A 12/22/2017   Procedure: COLONOSCOPY WITH PROPOFOL;  Surgeon: Brett Manifold, MD;  Location: [facility] ENDOSCOPY;  Service: Endoscopy;  Laterality: N/A;   COLONOSCOPY WITH PROPOFOL N/A 01/22/2021   Procedure: COLONOSCOPY WITH PROPOFOL;  Surgeon: Brett Bellows, MD;  Location: [facility] ENDOSCOPY;  Service: Gastroenterology;  Laterality: N/A;   CYSTOSCOPY WITH INSERTION OF UROLIFT     Laminectomy/Discectomy  11/03/2005   L5/S1   Malignant melanoma excision  06/20/2003   Dr. Nehemiah Mcguire   MRI  10/09/2005   Lumbar spine, bulging disc L5/S1   Right  lobectomy  1989   Lung cancer   RIGHT/LEFT HEART CATH AND CORONARY ANGIOGRAPHY Bilateral 09/06/2019   Procedure: RIGHT/LEFT HEART CATH AND CORONARY ANGIOGRAPHY;  Surgeon: Brett Merritts, MD;  Location: [place] CV LAB;  Service: Cardiovascular;  Laterality: Bilateral;   TEE WITHOUT CARDIOVERSION N/A 10/02/2019   Procedure: TRANSESOPHAGEAL ECHOCARDIOGRAM (TEE);  Surgeon: Sherren Mocha, MD;  Location: [place];  Service: Open Heart Surgery;  Laterality: N/A;   TONSILLECTOMY AND ADENOIDECTOMY     TRANSCATHETER AORTIC VALVE REPLACEMENT, TRANSFEMORAL  10/02/2019   Transcatheter Aortic Valve Replacement - Percutaneous Left Transfemoral Approach   TRANSCATHETER AORTIC VALVE REPLACEMENT, TRANSFEMORAL N/A 10/02/2019   Procedure: TRANSCATHETER AORTIC VALVE REPLACEMENT, TRANSFEMORAL USING EDWARDS SAPIEN 3 ULTRA 26 MM THV.;  Surgeon: Sherren Mocha, MD;  Location: [place];  Service: Open Heart Surgery;  Laterality: N/A;    Current Outpatient Medications  Medication Sig Dispense Refill   amLODipine (NORVASC) 10 MG tablet Take 0.5-1 tablets (5-10 mg total) by mouth daily.     amoxicillin (AMOXIL) 500 MG capsule Take 2,000 mg by mouth as directed. Take 2,000 mg one hour prior to all dental visits.  aspirin 81 MG chewable tablet Chew 1 tablet (81 mg total) by mouth daily.     atorvastatin (LIPITOR) 40 MG tablet Take 20 mg by mouth every Monday, Wednesday, and Friday.     folic acid (FOLVITE) 1 MG tablet Take 1 mg by mouth See admin instructions. Take 1 tablet (1 mg) by mouth on 6 days in the week, do NOT take on Mondays.     methotrexate 2.5 MG tablet Take 10 mg by mouth every Monday.      Polyethyl Glycol-Propyl Glycol (SYSTANE ULTRA) 0.4-0.3 % SOLN Place 1 drop into both eyes daily as needed (dry/irritated eyes.).      Skin Protectants, Misc. (EUCERIN) cream Apply 1 application topically daily as needed for dry skin.     No current facility-administered medications for this visit.   Allergies:    Atorvastatin, Ephedrine, Finasteride, Losartan, Spironolactone, Sulfa antibiotics, Sulfonamide derivatives, Tadalafil, Alfuzosin, Doxazosin, Flomax [tamsulosin hcl], and Rapaflo [silodosin]   Social History:  The patient  reports that he quit smoking about 39 years ago. His smoking use included cigarettes. He has a 34.00 pack-year smoking history. He has never used smokeless tobacco. He reports that he does not drink alcohol and does not use drugs.   Family History:   family history includes Alcohol abuse in his mother; Cancer in his father, mother, and another family member; Prostate cancer in his father.    Review of Systems: Review of Systems  Constitutional: Negative.   HENT: Negative.    Respiratory: Negative.    Cardiovascular: Negative.   Gastrointestinal: Negative.   Musculoskeletal: Negative.   Neurological: Negative.   Psychiatric/Behavioral: Negative.    All other systems reviewed and are negative.   PHYSICAL EXAM: VS:  BP (!) 140/80 (BP Location: Left Arm, Patient Position: Sitting, Cuff Size: Normal)   Pulse 64   Ht 5' 8.5" (1.74 m)   Wt 171 lb 2 oz (77.6 kg)   SpO2 97%   BMI 25.64 kg/m  , BMI Body mass index is 25.64 kg/m. Constitutional:  oriented to person, place, and time. No distress.  HENT:  Head: Grossly normal Eyes:  no discharge. No scleral icterus.  Neck: No JVD, no carotid bruits  Cardiovascular: Regular rate and rhythm, 1/6 systolic ejection murmur right sternal border Pulmonary/Chest: Clear to auscultation bilaterally, no wheezes or rails Abdominal: Soft.  no distension.  no tenderness.  Musculoskeletal: Normal range of motion Neurological:  normal muscle tone. Coordination normal. No atrophy Skin: Skin warm and dry Psychiatric: normal affect, pleasant   Recent Labs: 03/23/2021: ALT 20; BUN 21; Creatinine, Ser 1.05; Hemoglobin 13.7; Platelets 289.0; Potassium 4.7; Sodium 134; TSH 2.42    Lipid Panel Lab Results  Component Value Date   CHOL  144 03/23/2021   HDL 55.40 03/23/2021   LDLCALC 66 03/23/2021   TRIG 113.0 03/23/2021      Wt Readings from Last 3 Encounters:  09/28/21 171 lb 2 oz (77.6 kg)  03/23/21 172 lb (78 kg)  01/22/21 155 lb (70.3 kg)     ASSESSMENT AND PLAN:  Aortic valve disease -  Severe stenosis, s/p TAVR Echocardiogram last performed 2022, stable gradient On aspirin  Has NYHA class II symptoms.  Repeat echocardiogram scheduled in 1 year  TIA on aspirin  No recent episodes  Essential hypertension - Plan: EKG 12-Lead Losartan off given his concern for chest phlegm Continue amlodipine 5 daily Reports he stopped Arlyce Harman 12.5 daily secondary to polyuria Recent episode of dizziness, blood pressure was stable 120/60  Chronic fatigue Managed by primary care  Mixed hyperlipidemia - Plan: EKG 12-Lead Tolerating Lipitor 40 Cholesterol at goal  Carotid arterial stenosis Mild bilateral disease on study in 2017, less than 39%, again in 2021  Shortness of breath Long history of smoking, prior lung cancer Recommend regular walking program for conditioning   Total encounter time more than 30 minutes  Greater than 50% was spent in counseling and coordination of care with the patient   Orders Placed This Encounter  Procedures   EKG 12-Lead     Signed, Esmond Plants, M.D., Ph.D. 09/28/2021  [place], [place]

## 2021-09-28 ENCOUNTER — Encounter: Payer: Self-pay | Admitting: Cardiovascular Disease

## 2021-09-28 ENCOUNTER — Ambulatory Visit: Payer: Medicare Other | Attending: Cardiovascular Disease | Admitting: Cardiovascular Disease

## 2021-09-28 VITALS — BP 140/80 | HR 64 | Ht 68.5 in | Wt 171.1 lb

## 2021-09-28 DIAGNOSIS — I739 Peripheral vascular disease, unspecified: Secondary | ICD-10-CM | POA: Diagnosis not present

## 2021-09-28 DIAGNOSIS — E782 Mixed hyperlipidemia: Secondary | ICD-10-CM | POA: Diagnosis not present

## 2021-09-28 DIAGNOSIS — I779 Disorder of arteries and arterioles, unspecified: Secondary | ICD-10-CM | POA: Diagnosis not present

## 2021-09-28 DIAGNOSIS — Z952 Presence of prosthetic heart valve: Secondary | ICD-10-CM | POA: Insufficient documentation

## 2021-09-28 DIAGNOSIS — I10 Essential (primary) hypertension: Secondary | ICD-10-CM | POA: Diagnosis not present

## 2021-09-28 DIAGNOSIS — I350 Nonrheumatic aortic (valve) stenosis: Secondary | ICD-10-CM | POA: Insufficient documentation

## 2021-09-28 DIAGNOSIS — R0602 Shortness of breath: Secondary | ICD-10-CM | POA: Diagnosis not present

## 2021-09-28 NOTE — Patient Instructions (Addendum)
Medication Instructions:  No changes  If you need a refill on your cardiac medications before your next appointment, please call your pharmacy.    Lab work: No new labs needed   Testing/Procedures: 1) Echocardiogram: in 1 year (September 2024) - Your physician has requested that you have an echocardiogram. Echocardiography is a painless test that uses sound waves to create images of your heart. It provides your doctor with information about the size and shape of your heart and how well your heart's chambers and valves are working. This procedure takes approximately one hour. There are no restrictions for this procedure. There is a possibility that an IV may need to be started during your test to inject an image enhancing agent. This is done to obtain more optimal pictures of your heart. Therefore we ask that you do at least drink some water prior to coming in to hydrate your veins.    We will be in touch with you closer to this time to schedule  Follow-Up: At [facility], you and your health needs are our priority.  As part of our continuing mission to provide you with exceptional heart care, we have created designated Provider Care Teams.  These Care Teams include your primary Cardiologist (physician) and Advanced Practice Providers (APPs -  Physician Assistants and Nurse Practitioners) who all work together to provide you with the care you need, when you need it.  You will need a follow up appointment in 12 months  Providers on your designated Care Team:   Murray Hodgkins, NP Christell Faith, PA-C Cadence Kathlen Mody, [place]  COVID-19 Vaccine Information can be found at: [email or web address].uk For questions related to vaccine distribution or appointments, please email [email or web address] or call [phone])827-2024.

## 2021-09-29 DIAGNOSIS — M545 Low back pain, unspecified: Secondary | ICD-10-CM | POA: Diagnosis not present

## 2021-09-29 DIAGNOSIS — M9903 Segmental and somatic dysfunction of lumbar region: Secondary | ICD-10-CM | POA: Diagnosis not present

## 2021-09-29 DIAGNOSIS — M4306 Spondylolysis, lumbar region: Secondary | ICD-10-CM | POA: Diagnosis not present

## 2021-09-29 DIAGNOSIS — M9905 Segmental and somatic dysfunction of pelvic region: Secondary | ICD-10-CM | POA: Diagnosis not present

## 2021-10-27 DIAGNOSIS — M9903 Segmental and somatic dysfunction of lumbar region: Secondary | ICD-10-CM | POA: Diagnosis not present

## 2021-10-27 DIAGNOSIS — M545 Low back pain, unspecified: Secondary | ICD-10-CM | POA: Diagnosis not present

## 2021-10-27 DIAGNOSIS — M9905 Segmental and somatic dysfunction of pelvic region: Secondary | ICD-10-CM | POA: Diagnosis not present

## 2021-10-27 DIAGNOSIS — M4306 Spondylolysis, lumbar region: Secondary | ICD-10-CM | POA: Diagnosis not present

## 2021-10-28 DIAGNOSIS — R972 Elevated prostate specific antigen [PSA]: Secondary | ICD-10-CM | POA: Diagnosis not present

## 2021-10-28 DIAGNOSIS — R3121 Asymptomatic microscopic hematuria: Secondary | ICD-10-CM | POA: Diagnosis not present

## 2021-10-28 DIAGNOSIS — D400 Neoplasm of uncertain behavior of prostate: Secondary | ICD-10-CM | POA: Diagnosis not present

## 2021-10-28 DIAGNOSIS — N401 Enlarged prostate with lower urinary tract symptoms: Secondary | ICD-10-CM | POA: Diagnosis not present

## 2021-11-02 DIAGNOSIS — R972 Elevated prostate specific antigen [PSA]: Secondary | ICD-10-CM | POA: Diagnosis not present

## 2021-11-02 DIAGNOSIS — D400 Neoplasm of uncertain behavior of prostate: Secondary | ICD-10-CM | POA: Diagnosis not present

## 2021-11-02 DIAGNOSIS — N401 Enlarged prostate with lower urinary tract symptoms: Secondary | ICD-10-CM | POA: Diagnosis not present

## 2021-11-03 DIAGNOSIS — Z85820 Personal history of malignant melanoma of skin: Secondary | ICD-10-CM | POA: Diagnosis not present

## 2021-11-03 DIAGNOSIS — M545 Low back pain, unspecified: Secondary | ICD-10-CM | POA: Diagnosis not present

## 2021-11-03 DIAGNOSIS — Z85828 Personal history of other malignant neoplasm of skin: Secondary | ICD-10-CM | POA: Diagnosis not present

## 2021-11-03 DIAGNOSIS — L2089 Other atopic dermatitis: Secondary | ICD-10-CM | POA: Diagnosis not present

## 2021-11-03 DIAGNOSIS — L821 Other seborrheic keratosis: Secondary | ICD-10-CM | POA: Diagnosis not present

## 2021-11-03 DIAGNOSIS — D2272 Melanocytic nevi of left lower limb, including hip: Secondary | ICD-10-CM | POA: Diagnosis not present

## 2021-11-03 DIAGNOSIS — Z79899 Other long term (current) drug therapy: Secondary | ICD-10-CM | POA: Diagnosis not present

## 2021-11-03 DIAGNOSIS — M9905 Segmental and somatic dysfunction of pelvic region: Secondary | ICD-10-CM | POA: Diagnosis not present

## 2021-11-03 DIAGNOSIS — M4306 Spondylolysis, lumbar region: Secondary | ICD-10-CM | POA: Diagnosis not present

## 2021-11-03 DIAGNOSIS — D2261 Melanocytic nevi of right upper limb, including shoulder: Secondary | ICD-10-CM | POA: Diagnosis not present

## 2021-11-03 DIAGNOSIS — M9903 Segmental and somatic dysfunction of lumbar region: Secondary | ICD-10-CM | POA: Diagnosis not present

## 2021-11-03 DIAGNOSIS — D2271 Melanocytic nevi of right lower limb, including hip: Secondary | ICD-10-CM | POA: Diagnosis not present

## 2021-11-04 DIAGNOSIS — M9903 Segmental and somatic dysfunction of lumbar region: Secondary | ICD-10-CM | POA: Diagnosis not present

## 2021-11-04 DIAGNOSIS — M4306 Spondylolysis, lumbar region: Secondary | ICD-10-CM | POA: Diagnosis not present

## 2021-11-04 DIAGNOSIS — M9905 Segmental and somatic dysfunction of pelvic region: Secondary | ICD-10-CM | POA: Diagnosis not present

## 2021-11-04 DIAGNOSIS — M545 Low back pain, unspecified: Secondary | ICD-10-CM | POA: Diagnosis not present

## 2021-11-09 ENCOUNTER — Other Ambulatory Visit: Payer: Self-pay | Admitting: Urology

## 2021-11-09 DIAGNOSIS — R972 Elevated prostate specific antigen [PSA]: Secondary | ICD-10-CM

## 2021-11-09 DIAGNOSIS — Z79899 Other long term (current) drug therapy: Secondary | ICD-10-CM | POA: Diagnosis not present

## 2021-11-17 ENCOUNTER — Ambulatory Visit
Admission: RE | Admit: 2021-11-17 | Discharge: 2021-11-17 | Disposition: A | Discharge status: Home / Self Care | Payer: Medicare Other | Source: Ambulatory Visit | Attending: Urology | Admitting: Urology

## 2021-11-17 DIAGNOSIS — R972 Elevated prostate specific antigen [PSA]: Secondary | ICD-10-CM | POA: Insufficient documentation

## 2021-11-17 MED ORDER — GADOBUTROL 1 MMOL/ML IV SOLN
7.5000 mL | Freq: Once | INTRAVENOUS | Status: AC | PRN
  Administered 2021-11-17: 7.5 mL via INTRAVENOUS

## 2021-11-20 DIAGNOSIS — D400 Neoplasm of uncertain behavior of prostate: Secondary | ICD-10-CM | POA: Diagnosis not present

## 2021-11-20 DIAGNOSIS — N281 Cyst of kidney, acquired: Secondary | ICD-10-CM | POA: Diagnosis not present

## 2021-11-20 DIAGNOSIS — R972 Elevated prostate specific antigen [PSA]: Secondary | ICD-10-CM | POA: Diagnosis not present

## 2021-11-20 DIAGNOSIS — N401 Enlarged prostate with lower urinary tract symptoms: Secondary | ICD-10-CM | POA: Diagnosis not present

## 2021-12-14 DIAGNOSIS — M545 Low back pain, unspecified: Secondary | ICD-10-CM | POA: Diagnosis not present

## 2021-12-14 DIAGNOSIS — M4306 Spondylolysis, lumbar region: Secondary | ICD-10-CM | POA: Diagnosis not present

## 2021-12-14 DIAGNOSIS — M9905 Segmental and somatic dysfunction of pelvic region: Secondary | ICD-10-CM | POA: Diagnosis not present

## 2021-12-14 DIAGNOSIS — M9903 Segmental and somatic dysfunction of lumbar region: Secondary | ICD-10-CM | POA: Diagnosis not present

## 2021-12-23 ENCOUNTER — Telehealth: Payer: Self-pay | Admitting: Family Medicine

## 2021-12-23 NOTE — Telephone Encounter (Signed)
Multiple good options with urology in [place] including but not limited to Dr. Erlene Quan, Dr. Matilde Sprang, Dr. Bernardo Heater, Dr. Diamantina Providence.  Let me know if he needs a referral.  Thanks.

## 2021-12-23 NOTE — Telephone Encounter (Signed)
Patient called in and stated that his urologist retired. He was wanting to know if Dr. Damita Dunnings could recommend another urologist for him. Thank you!

## 2021-12-24 NOTE — Telephone Encounter (Signed)
Spoke with patient and advised patient of urology recommendations.

## 2021-12-31 ENCOUNTER — Encounter: Payer: Self-pay | Admitting: Urology

## 2022-01-29 ENCOUNTER — Encounter: Payer: Self-pay | Admitting: Urology

## 2022-01-29 ENCOUNTER — Ambulatory Visit (INDEPENDENT_AMBULATORY_CARE_PROVIDER_SITE_OTHER): Payer: Medicare Other | Admitting: Urology

## 2022-01-29 VITALS — BP 162/73 | HR 80 | Ht 68.0 in | Wt 163.0 lb

## 2022-01-29 DIAGNOSIS — R972 Elevated prostate specific antigen [PSA]: Secondary | ICD-10-CM

## 2022-01-29 NOTE — Progress Notes (Signed)
01/29/2022 11:01 AM   Brett Mcguire [birth year] [id number]  Referring provider: Joaquim Nam, MD [address] [place],  [place] [address]  Chief Complaint  Patient presents with   Other    HPI: Brett Mcguire is a 77 y.o. male presents to establish care.  Former patient Dr. Evelene Croon who recently retired and was followed for BPH, elevated PSA Last saw Dr. 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  Most recent biopsies performed in 2015, 2017 Last PSA 11/02/2021 was 10.7.  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 Excell Seltzer and Dr. Cornelius Moras   Severe aortic stenosis     Surgical History: Past Surgical History:  Procedure Laterality Date   COLONOSCOPY  02/18/2000   Polyp, diverticulosis, repeat in 5 years   COLONOSCOPY  03/03/2006   Repeat  -  Divertics   COLONOSCOPY WITH PROPOFOL N/A 12/22/2017   Procedure: COLONOSCOPY WITH PROPOFOL;  Surgeon: Pasty Spillers, MD;  Location: [facility] ENDOSCOPY;  Service: Endoscopy;  Laterality: N/A;   COLONOSCOPY WITH PROPOFOL N/A 01/22/2021   Procedure: COLONOSCOPY WITH PROPOFOL;  Surgeon: Wyline Mood, MD;  Location: [facility] ENDOSCOPY;  Service: Gastroenterology;  Laterality: N/A;   CYSTOSCOPY WITH INSERTION OF UROLIFT      Laminectomy/Discectomy  11/03/2005   L5/S1   Malignant melanoma excision  06/20/2003   Dr. Gwen Pounds   MRI  10/09/2005   Lumbar spine, bulging disc L5/S1   Right lobectomy  1989   Lung cancer   RIGHT/LEFT HEART CATH AND CORONARY ANGIOGRAPHY Bilateral 09/06/2019   Procedure: RIGHT/LEFT HEART CATH AND CORONARY ANGIOGRAPHY;  Surgeon: Antonieta Iba, MD;  Location: [facility] INVASIVE CV LAB;  Service: Cardiovascular;  Laterality: Bilateral;   TEE WITHOUT CARDIOVERSION N/A 10/02/2019   Procedure: TRANSESOPHAGEAL ECHOCARDIOGRAM (TEE);  Surgeon: Tonny Bollman, MD;  Location[facility];  Service: Open Heart Surgery;  Laterality: N/A;   TONSILLECTOMY AND ADENOIDECTOMY     TRANSCATHETER AORTIC VALVE REPLACEMENT, TRANSFEMORAL  10/02/2019   Transcatheter Aortic Valve Replacement - Percutaneous Left Transfemoral Approach   TRANSCATHETER AORTIC VALVE REPLACEMENT, TRANSFEMORAL N/A 10/02/2019   Procedure: TRANSCATHETER AORTIC VALVE REPLACEMENT, TRANSFEMORAL USING EDWARDS SAPIEN 3 ULTRA 26 MM THV.;  Surgeon: Tonny Bollman, MD;  Location: [facility] OR;  Service: Open Heart Surgery;  Laterality: N/A;    Home Medications:  Allergies as of 01/29/2022       Reactions   Atorvastatin    Aches at 40mg  a day.    Ephedrine Other (See Comments)   BP spikes Other reaction(s): Other (See Comments) BP spikes   Finasteride Diarrhea   diarrhea   Losartan    Cough.  Improved off med.  Spironolactone    Urinary frequency/nocturia.    Sulfa Antibiotics    Other reaction(s): Other (See Comments) REACTION: as child   Sulfonamide Derivatives Other (See Comments)   REACTION: as child   Tadalafil Other (See Comments)   heartburn Other reaction(s): Other (See Comments) heartburn   Alfuzosin Rash, Other (See Comments)   Skin rash, burning sensation of skin Other reaction(s): Other (See Comments) Skin rash, burning sensation of skin   Doxazosin Rash, Other (See Comments)   Skin rash, burning sensation of skin Other  reaction(s): Other (See Comments) Skin rash, burning sensation of skin   Flomax [tamsulosin Hcl] Rash   Rash   Rapaflo [silodosin] Rash   rash        Medication List        Accurate as of January 29, 2022 11:01 AM. If you have any questions, ask your nurse or doctor.          amLODipine 10 MG tablet Commonly known as: NORVASC Take 0.5-1 tablets (5-10 mg total) by mouth daily.   amoxicillin 500 MG capsule Commonly known as: AMOXIL Take 2,000 mg by mouth as directed. Take 2,000 mg one hour prior to all dental visits.   aspirin 81 MG chewable tablet Chew 1 tablet (81 mg total) by mouth daily.   atorvastatin 40 MG tablet Commonly known as: LIPITOR Take 20 mg by mouth every Monday, Wednesday, and Friday.   eucerin cream Apply 1 application topically daily as needed for dry skin.   folic acid 1 MG tablet Commonly known as: FOLVITE Take 1 mg by mouth See admin instructions. Take 1 tablet (1 mg) by mouth on 6 days in the week, do NOT take on Mondays.   methotrexate 2.5 MG tablet Commonly known as: RHEUMATREX Take 10 mg by mouth every Monday.   Systane Ultra 0.4-0.3 % Soln Generic drug: Polyethyl Glycol-Propyl Glycol Place 1 drop into both eyes daily as needed (dry/irritated eyes.).        Allergies:  Allergies  Allergen Reactions   Atorvastatin     Aches at 40mg  a day.    Ephedrine Other (See Comments)    BP spikes  Other reaction(s): Other (See Comments) BP spikes   Finasteride Diarrhea    diarrhea   Losartan     Cough.  Improved off med.     Spironolactone     Urinary frequency/nocturia.    Sulfa Antibiotics     Other reaction(s): Other (See Comments) REACTION: as child   Sulfonamide Derivatives Other (See Comments)    REACTION: as child   Tadalafil Other (See Comments)    heartburn Other reaction(s): Other (See Comments) heartburn   Alfuzosin Rash and Other (See Comments)    Skin rash, burning sensation of skin Other reaction(s): Other (See  Comments) Skin rash, burning sensation of skin   Doxazosin Rash and Other (See Comments)    Skin rash, burning sensation of skin Other reaction(s): Other (See Comments) Skin rash, burning sensation of skin   Flomax [Tamsulosin Hcl] Rash    Rash   Rapaflo [Silodosin] Rash    rash    Family History: Family History  Problem Relation Age of Onset   Cancer Mother        Garretson throat   Alcohol abuse Mother    Cancer Father        4 kinds; 5 places, prostate and lung   Prostate cancer Father        possible prostate cancer   Cancer Other  Skin, deceased in 1996   Colon cancer Neg Hx        none known    Social History:  reports that he quit smoking about 39 years ago. His smoking use included cigarettes. He has a 34.00 pack-year smoking history. He has never used smokeless tobacco. He reports that he does not drink alcohol and does not use drugs.   Physical Exam: BP (!) 162/73   Pulse 80   Ht 5\' 8"  (1.727 m)   Wt 163 lb (73.9 kg)   BMI 24.78 kg/m   Constitutional:  Alert and oriented, No acute distress. HEENT: [place] AT Respiratory: Normal respiratory effort, no increased work of breathing. GU: Prostate 50 g, smooth without nodules Psychiatric: Normal mood and affect.   Assessment & Plan:    1.  Elevated PSA Prior biopsies benign and some with focal high-grade PIN/atypia Recent MRI showed no lesions suspicious for high-grade cancer He desires to continue surveillance and will schedule lab visit for PSA April 2024 and office visit with PSA November 2024   December 2024, MD  [facility] [address] [place], [place] [phone]

## 2022-01-31 ENCOUNTER — Other Ambulatory Visit: Payer: Self-pay | Admitting: Cardiovascular Disease

## 2022-02-02 DIAGNOSIS — M9903 Segmental and somatic dysfunction of lumbar region: Secondary | ICD-10-CM | POA: Diagnosis not present

## 2022-02-02 DIAGNOSIS — M9905 Segmental and somatic dysfunction of pelvic region: Secondary | ICD-10-CM | POA: Diagnosis not present

## 2022-02-02 DIAGNOSIS — M4306 Spondylolysis, lumbar region: Secondary | ICD-10-CM | POA: Diagnosis not present

## 2022-02-02 DIAGNOSIS — M545 Low back pain, unspecified: Secondary | ICD-10-CM | POA: Diagnosis not present

## 2022-03-01 ENCOUNTER — Other Ambulatory Visit: Payer: Self-pay | Admitting: Cardiovascular Disease

## 2022-03-01 NOTE — Telephone Encounter (Signed)
Refill Request.  

## 2022-03-02 DIAGNOSIS — M4306 Spondylolysis, lumbar region: Secondary | ICD-10-CM | POA: Diagnosis not present

## 2022-03-02 DIAGNOSIS — M9903 Segmental and somatic dysfunction of lumbar region: Secondary | ICD-10-CM | POA: Diagnosis not present

## 2022-03-02 DIAGNOSIS — M545 Low back pain, unspecified: Secondary | ICD-10-CM | POA: Diagnosis not present

## 2022-03-02 DIAGNOSIS — M9905 Segmental and somatic dysfunction of pelvic region: Secondary | ICD-10-CM | POA: Diagnosis not present

## 2022-03-02 NOTE — Telephone Encounter (Signed)
Which medication ?

## 2022-03-02 NOTE — Telephone Encounter (Signed)
Looks like Dr. [name] already did the refill request for amlodipine

## 2022-03-25 ENCOUNTER — Ambulatory Visit (INDEPENDENT_AMBULATORY_CARE_PROVIDER_SITE_OTHER): Payer: Medicare Other

## 2022-03-25 VITALS — Ht 68.0 in | Wt 164.5 lb

## 2022-03-25 DIAGNOSIS — Z0000 Encounter for general adult medical examination without abnormal findings: Secondary | ICD-10-CM | POA: Diagnosis not present

## 2022-03-25 NOTE — Patient Instructions (Signed)
Mr. Brett Mcguire , Thank you for taking time to come for your Medicare Wellness Visit. I appreciate your ongoing commitment to your health goals. Please review the following plan we discussed and let me know if I can assist you in the future.   These are the goals we discussed:  Goals      Increase physical activity     Starting 06/06/2018, I will continue to walk up to 2 miles daily.      Patient Stated     Get psoriasis under control.        This is a list of the screening recommended for you and due dates:  Health Maintenance  Topic Date Due   Zoster (Shingles) Vaccine (1 of 2) Never done   COVID-19 Vaccine (3 - Pfizer risk series) 05/03/2019   Pneumonia Vaccine (3 of 3 - PPSV23 or PCV20) 02/24/2020   Flu Shot  08/11/2021   Medicare Annual Wellness Visit  03/25/2023   Colon Cancer Screening  01/23/2024   DTaP/Tdap/Td vaccine (3 - Td or Tdap) 05/14/2024   Hepatitis C Screening: USPSTF Recommendation to screen - Ages 96-77 yo.  Completed   HPV Vaccine  Aged Out    Advanced directives: Please bring a copy of your health care power of attorney and living will to the office to be added to your chart at your convenience.   Conditions/risks identified: none  Next appointment: Follow up in one year for your annual wellness visit. 03/30/2023 @ 8:45 via telephone.  Preventive Care 47 Years and Older, Male  Preventive care refers to lifestyle choices and visits with your health care provider that can promote health and wellness. What does preventive care include? A yearly physical exam. This is also called an annual well check. Dental exams once or twice a year. Routine eye exams. Ask your health care provider how often you should have your eyes checked. Personal lifestyle choices, including: Daily care of your teeth and gums. Regular physical activity. Eating a healthy diet. Avoiding tobacco and drug use. Limiting alcohol use. Practicing safe sex. Taking low doses of aspirin  every day. Taking vitamin and mineral supplements as recommended by your health care provider. What happens during an annual well check? The services and screenings done by your health care provider during your annual well check will depend on your age, overall health, lifestyle risk factors, and family history of disease. Counseling  Your health care provider may ask you questions about your: Alcohol use. Tobacco use. Drug use. Emotional well-being. Home and relationship well-being. Sexual activity. Eating habits. History of falls. Memory and ability to understand (cognition). Work and work Statistician. Screening  You may have the following tests or measurements: Height, weight, and BMI. Blood pressure. Lipid and cholesterol levels. These may be checked every 5 years, or more frequently if you are over 81 years old. Skin check. Lung cancer screening. You may have this screening every year starting at age 77 if you have a 30-pack-year history of smoking and currently smoke or have quit within the past 15 years. Fecal occult blood test (FOBT) of the stool. You may have this test every year starting at age 77. Flexible sigmoidoscopy or colonoscopy. You may have a sigmoidoscopy every 5 years or a colonoscopy every 10 years starting at age 77. Prostate cancer screening. Recommendations will vary depending on your family history and other risks. Hepatitis C blood test. Hepatitis B blood test. Sexually transmitted disease (STD) testing. Diabetes screening. This is done by checking  your blood sugar (glucose) after you have not eaten for a while (fasting). You may have this done every 1-3 years. Abdominal aortic aneurysm (AAA) screening. You may need this if you are a current or former smoker. Osteoporosis. You may be screened starting at age 77 if you are at high risk. Talk with your health care provider about your test results, treatment options, and if necessary, the need for more  tests. Vaccines  Your health care provider may recommend certain vaccines, such as: Influenza vaccine. This is recommended every year. Tetanus, diphtheria, and acellular pertussis (Tdap, Td) vaccine. You may need a Td booster every 10 years. Zoster vaccine. You may need this after age 77. Pneumococcal 13-valent conjugate (PCV13) vaccine. One dose is recommended after age 77. Pneumococcal polysaccharide (PPSV23) vaccine. One dose is recommended after age 77. Talk to your health care provider about which screenings and vaccines you need and how often you need them. This information is not intended to replace advice given to you by your health care provider. Make sure you discuss any questions you have with your health care provider. Document Released: 01/24/2015 Document Revised: 09/17/2015 Document Reviewed: 10/29/2014 Elsevier Interactive Patient Education  2017 [place] Prevention in the Home Falls can cause injuries. They can happen to people of all ages. There are many things you can do to make your home safe and to help prevent falls. What can I do on the outside of my home? Regularly fix the edges of walkways and driveways and fix any cracks. Remove anything that might make you trip as you walk through a door, such as a raised step or threshold. Trim any bushes or trees on the path to your home. Use bright outdoor lighting. Clear any walking paths of anything that might make someone trip, such as rocks or tools. Regularly check to see if handrails are loose or broken. Make sure that both sides of any steps have handrails. Any raised decks and porches should have guardrails on the edges. Have any leaves, snow, or ice cleared regularly. Use sand or salt on walking paths during winter. Clean up any spills in your garage right away. This includes oil or grease spills. What can I do in the bathroom? Use night lights. Install grab bars by the toilet and in the tub and shower.  Do not use towel bars as grab bars. Use non-skid mats or decals in the tub or shower. If you need to sit down in the shower, use a plastic, non-slip stool. Keep the floor dry. Clean up any water that spills on the floor as soon as it happens. Remove soap buildup in the tub or shower regularly. Attach bath mats securely with double-sided non-slip rug tape. Do not have throw rugs and other things on the floor that can make you trip. What can I do in the bedroom? Use night lights. Make sure that you have a light by your bed that is easy to reach. Do not use any sheets or blankets that are too big for your bed. They should not hang down onto the floor. Have a firm chair that has side arms. You can use this for support while you get dressed. Do not have throw rugs and other things on the floor that can make you trip. What can I do in the kitchen? Clean up any spills right away. Avoid walking on wet floors. Keep items that you use a lot in easy-to-reach places. If you need to reach something  above you, use a strong step stool that has a grab bar. Keep electrical cords out of the way. Do not use floor polish or wax that makes floors slippery. If you must use wax, use non-skid floor wax. Do not have throw rugs and other things on the floor that can make you trip. What can I do with my stairs? Do not leave any items on the stairs. Make sure that there are handrails on both sides of the stairs and use them. Fix handrails that are broken or loose. Make sure that handrails are as long as the stairways. Check any carpeting to make sure that it is firmly attached to the stairs. Fix any carpet that is loose or worn. Avoid having throw rugs at the top or bottom of the stairs. If you do have throw rugs, attach them to the floor with carpet tape. Make sure that you have a light switch at the top of the stairs and the bottom of the stairs. If you do not have them, ask someone to add them for you. What else  can I do to help prevent falls? Wear shoes that: Do not have high heels. Have rubber bottoms. Are comfortable and fit you well. Are closed at the toe. Do not wear sandals. If you use a stepladder: Make sure that it is fully opened. Do not climb a closed stepladder. Make sure that both sides of the stepladder are locked into place. Ask someone to hold it for you, if possible. Clearly mark and make sure that you can see: Any grab bars or handrails. First and last steps. Where the edge of each step is. Use tools that help you move around (mobility aids) if they are needed. These include: Canes. Walkers. Scooters. Crutches. Turn on the lights when you go into a dark area. Replace any light bulbs as soon as they burn out. Set up your furniture so you have a clear path. Avoid moving your furniture around. If any of your floors are uneven, fix them. If there are any pets around you, be aware of where they are. Review your medicines with your doctor. Some medicines can make you feel dizzy. This can increase your chance of falling. Ask your doctor what other things that you can do to help prevent falls. This information is not intended to replace advice given to you by your health care provider. Make sure you discuss any questions you have with your health care provider. Document Released: 10/24/2008 Document Revised: 06/05/2015 Document Reviewed: 02/01/2014 Elsevier Interactive Patient Education  2017 [employer].

## 2022-03-25 NOTE — Progress Notes (Signed)
I connected with  Brett Mcguire on 03/25/22 by a audio enabled telemedicine application and verified that I am speaking with the correct person using two identifiers.  Patient Location: Home  Provider Location: Home Office  I discussed the limitations of evaluation and management by telemedicine. The patient expressed understanding and agreed to proceed.  Subjective:   Brett Mcguire is a 77 y.o. male who presents for Medicare Annual/Subsequent preventive examination.  Review of Systems      Cardiac Risk Factors include: advanced age (>79mn, >>28women);hypertension;male gender     Objective:    Today's Vitals   03/25/22 1022 03/25/22 1028  Weight: 164 lb 8 oz (74.6 kg)   Height: '5\' 8"'$  (1.727 m)   PainSc:  6    Body mass index is 25.01 kg/m.     03/25/2022   10:38 AM 01/22/2021    8:50 AM 11/08/2019    1:56 PM 10/02/2019    1:55 PM 09/28/2019   10:15 AM 09/18/2019    1:01 PM 09/06/2019    9:10 AM  Advanced Directives  Does Patient Have a Medical Advance Directive? Yes No No No No No No  Type of AParamedicof A[place]menLiving will        Copy of [place] Chart? No - copy requested        Would patient like information on creating a medical advance directive?    No - Patient declined No - Patient declined No - Guardian declined No - Guardian declined    Current Medications (verified) Outpatient Encounter Medications as of 03/25/2022  Medication Sig   amLODipine (NORVASC) 10 MG tablet TAKE 1 TABLET BY MOUTH DAILY.   aspirin 81 MG chewable tablet Chew 1 tablet (81 mg total) by mouth daily.   atorvastatin (LIPITOR) 40 MG tablet TAKE 1 TABLET BY MOUTH DAILY   folic acid (FOLVITE) 1 MG tablet Take 1 mg by mouth See admin instructions. Take 1 tablet (1 mg) by mouth on 6 days in the week, do NOT take on Mondays.   methotrexate 2.5 MG tablet Take 10 mg by mouth every Monday.    Skin Protectants, Misc. (EUCERIN) cream Apply 1  application topically daily as needed for dry skin.   amoxicillin (AMOXIL) 500 MG capsule Take 2,000 mg by mouth as directed. Take 2,000 mg one hour prior to all dental visits. (Patient not taking: Reported on 03/25/2022)   Polyethyl Glycol-Propyl Glycol (SYSTANE ULTRA) 0.4-0.3 % SOLN Place 1 drop into both eyes daily as needed (dry/irritated eyes.).  (Patient not taking: Reported on 03/25/2022)   No facility-administered encounter medications on file as of 03/25/2022.    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 CoBurt Knack  and Dr. Roxy Manns   Severe aortic stenosis    Past Surgical History:  Procedure Laterality Date   COLONOSCOPY  02/18/2000   Polyp, diverticulosis, repeat in 5 years   COLONOSCOPY  03/03/2006   Repeat  -  Divertics   COLONOSCOPY WITH PROPOFOL N/A 12/22/2017   Procedure: COLONOSCOPY WITH PROPOFOL;  Surgeon: Virgel Manifold, MD;  Location: [facility] ENDOSCOPY;  Service: Endoscopy;  Laterality: N/A;   COLONOSCOPY WITH PROPOFOL N/A 01/22/2021   Procedure: COLONOSCOPY WITH PROPOFOL;  Surgeon: Jonathon Bellows, MD;  Location: [facility] ENDOSCOPY;  Service: Gastroenterology;  Laterality: N/A;   CYSTOSCOPY WITH INSERTION OF UROLIFT     Laminectomy/Discectomy  11/03/2005   L5/S1    Malignant melanoma excision  06/20/2003   Dr. Nehemiah Massed   MRI  10/09/2005   Lumbar spine, bulging disc L5/S1   Right lobectomy  1989   Lung cancer   RIGHT/LEFT HEART CATH AND CORONARY ANGIOGRAPHY Bilateral 09/06/2019   Procedure: RIGHT/LEFT HEART CATH AND CORONARY ANGIOGRAPHY;  Surgeon: Minna Merritts, MD;  Location: [place] [facility];  Service: Cardiovascular;  Laterality: Bilateral;   TEE WITHOUT CARDIOVERSION N/A 10/02/2019   Procedure: TRANSESOPHAGEAL ECHOCARDIOGRAM (TEE);  Surgeon: Sherren Mocha, MD;  Location: [place];  Service: Open Heart Surgery;  Laterality: N/A;   TONSILLECTOMY AND ADENOIDECTOMY     TRANSCATHETER AORTIC VALVE REPLACEMENT, TRANSFEMORAL  10/02/2019   Transcatheter Aortic Valve Replacement - Percutaneous Left Transfemoral Approach   TRANSCATHETER AORTIC VALVE REPLACEMENT, TRANSFEMORAL N/A 10/02/2019   Procedure: TRANSCATHETER AORTIC VALVE REPLACEMENT, TRANSFEMORAL USING EDWARDS SAPIEN 3 ULTRA 26 MM THV.;  Surgeon: Sherren Mocha, MD;  Location: [place];  Service: Open Heart Surgery;  Laterality: N/A;   Family History  Problem Relation Age of Onset   Cancer Mother        Orient throat   Alcohol abuse Mother    Cancer Father        4 kinds; 5 places, prostate and lung   Prostate cancer Father        possible prostate cancer   Cancer Other        Skin, deceased in 23   Colon cancer Neg Hx        none known   Social History   Socioeconomic History   Marital status: Married    Spouse name: Tye [place]   Number of children: 2   Years of education: Not on file   Highest education level: Not on file  Occupational History   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    Married 50+ years   Social Determinants of Health   Financial Resource Strain: Low Risk  (03/25/2022)   Overall Financial Resource Strain (CARDIA)    Difficulty of Paying Living Expenses: Not hard at all  Food Insecurity: No Food Insecurity (03/25/2022)   Hunger Vital Sign    Worried About Running Out of Food in the Last Year: Never true    Ran Out of Food in the Last Year: Never true  Transportation Needs: No Transportation Needs (03/25/2022)   PRAPARE - Hydrologist (Medical): No    Lack of Transportation (Non-Medical): No  Physical Activity: Sufficiently Active (03/25/2022)   Exercise Vital Sign    Days of Exercise per Week: 7 days    Minutes of Exercise per Session: 120 min  Stress: Not on file  Social Connections: Moderately Isolated (03/25/2022)   Social Connection and Isolation Panel [NHANES]    Frequency of Communication with Friends and Family: More than three times a week    Frequency of Social Gatherings with Friends and Family: More than three times a week    Attends Religious Services: Never    Marine scientist or Organizations: No    Attends Music therapist: Never    Marital Status: Married    Tobacco Counseling Counseling given: Not Answered   Clinical Intake:  Pre-visit preparation completed: Yes  Pain : 0-10 Pain Score: 6  Pain Location: Arm Pain Orientation: Right Pain Descriptors / Indicators: Aching Pain Onset: 1 to 4 weeks ago     Nutritional Risks: None Diabetes: No  How often do you need to have someone help you when you read instructions, pamphlets, or other written materials from your doctor or pharmacy?: 1 -  Never  Diabetic?no  Interpreter Needed?: No  Information entered by :: C.Kalyiah Saintil LPN   Activities of Daily Living    03/25/2022   10:38 AM  In your present state of health, do you have any difficulty performing the following activities:  Hearing? 0  Vision? 0  Difficulty concentrating or making decisions? 1  Comment short term memory  Walking or climbing stairs? 0  Dressing or bathing? 0  Doing errands, shopping? 0  Preparing Food and eating ? N  Using the Toilet? N  In the past six months, have you accidently leaked urine? Y  Comment Sees urologist  Do you have problems with loss of bowel control? N  Managing your Medications? N  Managing your Finances? N  Housekeeping or managing your Housekeeping? N    Patient Care Team: Tonia Ghent, MD as PCP - General (Family Medicine) [name] Kathlene November, MD as PCP - Cardiology (Cardiology) Minna Merritts, MD as Consulting Physician (Cardiology) Royston Cowper, MD (Urology)  Indicate any recent Medical Services you may have received from other than [facility] providers in the past year (date may be approximate).     Assessment:   This is a routine wellness examination for Brett Mcguire.  Hearing/Vision screen Hearing Screening - Comments:: No aids Vision Screening - Comments:: No glasses  Dietary issues and exercise activities discussed: Current Exercise Habits: Home exercise routine, Type of exercise: walking, Time (Minutes): > 60, Frequency (Times/Week): 7, Weekly Exercise (Minutes/Week): 0, Intensity: Mild, Exercise limited by: None identified   Goals Addressed             This Visit's Progress    Patient Stated       Get psoriasis under control.       Depression Screen    03/25/2022   10:36 AM 03/23/2021    9:03 AM 12/11/2019    2:00 PM 06/06/2018   11:08 AM 05/31/2017   10:49 AM 05/26/2016    2:09  PM  PHQ 2/9 Scores  PHQ - 2 Score 0 0 1 0 1 0  PHQ- 9 Score   3 0 3     Fall Risk    03/25/2022   10:31 AM  03/23/2021    9:03 AM 12/11/2019    2:01 PM 06/06/2018   11:08 AM 05/31/2017   10:49 AM  Fall Risk   Falls in the past year? 0 0 0 0 No  Number falls in past yr: 0 0 0    Injury with Fall? 0 0 0    Risk for fall due to : No Fall Risks No Fall Risks     Follow up Falls prevention discussed;Falls evaluation completed Falls evaluation completed       FALL RISK PREVENTION PERTAINING TO THE HOME:  Any stairs in or around the home? Yes  If so, are there any without handrails? No  Home free of loose throw rugs in walkways, pet beds, electrical cords, etc? Yes  Adequate lighting in your home to reduce risk of falls? Yes   ASSISTIVE DEVICES UTILIZED TO PREVENT FALLS:  Life alert? No  Use of a cane, walker or w/c? No  Grab bars in the bathroom? No  Shower chair or bench in shower? Yes  Elevated toilet seat or a handicapped toilet? No    Cognitive Function:    06/06/2018   11:50 AM 05/31/2017   10:49 AM 05/26/2016    2:10 PM  MMSE - Mini Mental State Exam  Orientation to time '5 5 5  '$ Orientation to Place '5 5 5  '$ Registration '3 3 3  '$ Attention/ Calculation 0 0 0  Recall '3 2 3  '$ Recall-comments  unable to recall 1 of 3 words   Language- name 2 objects 0 0 0  Language- repeat '1 1 1  '$ Language- follow 3 step command 0 3 3  Language- read & follow direction 0 0 0  Write a sentence 0 0 0  Copy design 0 0 0  Total score '17 19 20        '$ 03/25/2022   10:40 AM  6CIT Screen  What Year? 0 points  What month? 0 points  What time? 0 points  Count back from 20 0 points  Months in reverse 0 points  Repeat phrase 0 points  Total Score 0 points    Immunizations Immunization History  Administered Date(s) Administered   Fluad Quad(high Dose 65+) 12/11/2019, 03/23/2021   Influenza, High Dose Seasonal PF 11/24/2018   Influenza,inj,Quad PF,6+ Mos 11/01/2016, 11/08/2017   Influenza-Unspecified 10/31/2014, 11/13/2014, 12/01/2015   PFIZER(Purple Top)SARS-COV-2 Vaccination 03/15/2019, 04/05/2019    Pneumococcal Conjugate-13 02/24/2015   Pneumococcal Polysaccharide-23 01/11/1994   Pneumococcal-Unspecified 01/16/2012   Td 02/09/2006   Tdap 05/15/2014   Zoster, Live 08/11/2013    TDAP status: Up to date  Flu Vaccine status: Due, Education has been provided regarding the importance of this vaccine. Advised may receive this vaccine at local pharmacy or Health Dept. Aware to provide a copy of the vaccination record if obtained from local pharmacy or Health Dept. Verbalized acceptance and understanding.  Pneumococcal vaccine status: Due, Education has been provided regarding the importance of this vaccine. Advised may receive this vaccine at local pharmacy or Health Dept. Aware to provide a copy of the vaccination record if obtained from local pharmacy or Health Dept. Verbalized acceptance and understanding.  Covid-19 vaccine status: Declined, Education has been provided regarding the importance of this vaccine but patient still declined. Advised may  receive this vaccine at local pharmacy or Health Dept.or vaccine clinic. Aware to provide a copy of the vaccination record if obtained from local pharmacy or Health Dept. Verbalized acceptance and understanding.  Qualifies for Shingles Vaccine? Yes   Zostavax completed Yes   Shingrix Completed?: No.    Education has been provided regarding the importance of this vaccine. Patient has been advised to call insurance company to determine out of pocket expense if they have not yet received this vaccine. Advised may also receive vaccine at local pharmacy or Health Dept. Verbalized acceptance and understanding.  Screening Tests Health Maintenance  Topic Date Due   Zoster Vaccines- Shingrix (1 of 2) Never done   COVID-19 Vaccine (3 - Pfizer risk series) 05/03/2019   Pneumonia Vaccine 74+ Years old (3 of 3 - PPSV23 or PCV20) 02/24/2020   INFLUENZA VACCINE  08/11/2021   Medicare Annual Wellness (AWV)  03/25/2023   COLONOSCOPY (Pts 45-5yr Insurance  coverage will need to be confirmed)  01/23/2024   DTaP/Tdap/Td (3 - Td or Tdap) 05/14/2024   Hepatitis C Screening  Completed   HPV VACCINES  Aged Out    Health Maintenance  Health Maintenance Due  Topic Date Due   Zoster Vaccines- Shingrix (1 of 2) Never done   COVID-19 Vaccine (3 - Pfizer risk series) 05/03/2019   Pneumonia Vaccine 77 Years old (3 of 3 - PPSV23 or PCV20) 02/24/2020   INFLUENZA VACCINE  08/11/2021    Colorectal cancer screening: No longer required.   Lung Cancer Screening: (Low Dose CT Chest recommended if Age 77-80years, 30 pack-year currently smoking OR have quit w/in 15years.) does not qualify.   Lung Cancer Screening Referral: no  Additional Screening:  Hepatitis C Screening: does not qualify; Completed 05/26/2016  Vision Screening: Recommended annual ophthalmology exams for early detection of glaucoma and other disorders of the eye. Is the patient up to date with their annual eye exam?  No , pt has appt. Scheduled. Who is the provider or what is the name of the office in which the patient attends annual eye exams? Dr.Bell If pt is not established with a provider, would they like to be referred to a provider to establish care? No .   Dental Screening: Recommended annual dental exams for proper oral hygiene  Community Resource Referral / Chronic Care Management: CRR required this visit?  No   CCM required this visit?  No      Plan:     I have personally reviewed and noted the following in the patient's chart:   Medical and social history Use of alcohol, tobacco or illicit drugs  Current medications and supplements including opioid prescriptions. Patient is not currently taking opioid prescriptions. Functional ability and status Nutritional status Physical activity Advanced directives List of other physicians Hospitalizations, surgeries, and ER visits in previous 12 months Vitals Screenings to include cognitive, depression, and  falls Referrals and appointments  In addition, I have reviewed and discussed with patient certain preventive protocols, quality metrics, and best practice recommendations. A written personalized care plan for preventive services as well as general preventive health recommendations were provided to patient.     CLebron Conners LPN   [id number]  Nurse Notes: none

## 2022-03-26 ENCOUNTER — Encounter: Payer: Self-pay | Admitting: Family Medicine

## 2022-03-26 ENCOUNTER — Ambulatory Visit (INDEPENDENT_AMBULATORY_CARE_PROVIDER_SITE_OTHER): Payer: Medicare Other | Admitting: Family Medicine

## 2022-03-26 VITALS — BP 128/80 | HR 65 | Temp 97.7°F | Ht 68.0 in | Wt 170.0 lb

## 2022-03-26 DIAGNOSIS — I10 Essential (primary) hypertension: Secondary | ICD-10-CM

## 2022-03-26 DIAGNOSIS — Z952 Presence of prosthetic heart valve: Secondary | ICD-10-CM

## 2022-03-26 DIAGNOSIS — Z23 Encounter for immunization: Secondary | ICD-10-CM

## 2022-03-26 DIAGNOSIS — E782 Mixed hyperlipidemia: Secondary | ICD-10-CM

## 2022-03-26 DIAGNOSIS — Z0000 Encounter for general adult medical examination without abnormal findings: Secondary | ICD-10-CM

## 2022-03-26 DIAGNOSIS — L400 Psoriasis vulgaris: Secondary | ICD-10-CM

## 2022-03-26 DIAGNOSIS — Z7189 Other specified counseling: Secondary | ICD-10-CM

## 2022-03-26 DIAGNOSIS — Z8042 Family history of malignant neoplasm of prostate: Secondary | ICD-10-CM

## 2022-03-26 LAB — COMPREHENSIVE METABOLIC PANEL
ALT: 17 U/L (ref 0–53)
AST: 22 U/L (ref 0–37)
Albumin: 4.1 g/dL (ref 3.5–5.2)
Alkaline Phosphatase: 69 U/L (ref 39–117)
BUN: 18 mg/dL (ref 6–23)
CO2: 26 mEq/L (ref 19–32)
Calcium: 9.6 mg/dL (ref 8.4–10.5)
Chloride: 100 mEq/L (ref 96–112)
Creatinine, Ser: 0.93 mg/dL (ref 0.40–1.50)
GFR: 79.87 mL/min (ref >60.00)
Glucose, Bld: 96 mg/dL (ref 70–99)
Potassium: 3.9 mEq/L (ref 3.5–5.1)
Sodium: 134 mEq/L — ABNORMAL LOW (ref 135–145)
Total Bilirubin: 0.6 mg/dL (ref 0.2–1.2)
Total Protein: 7.2 g/dL (ref 6.0–8.3)

## 2022-03-26 LAB — CBC WITH DIFFERENTIAL/PLATELET
Basophils Absolute: 0 10*3/uL (ref 0.0–0.1)
Basophils Relative: 0.8 % (ref 0.0–3.0)
Eosinophils Absolute: 0.1 10*3/uL (ref 0.0–0.7)
Eosinophils Relative: 2 % (ref 0.0–5.0)
HCT: 39.6 % (ref 39.0–52.0)
Hemoglobin: 13.3 g/dL (ref 13.0–17.0)
Lymphocytes Relative: 21.9 % (ref 12.0–46.0)
Lymphs Abs: 1.1 10*3/uL (ref 0.7–4.0)
MCHC: 33.7 g/dL (ref 30.0–36.0)
MCV: 94.8 fl (ref 78.0–100.0)
Monocytes Absolute: 0.4 10*3/uL (ref 0.1–1.0)
Monocytes Relative: 8.8 % (ref 3.0–12.0)
Neutro Abs: 3.2 10*3/uL (ref 1.4–7.7)
Neutrophils Relative %: 66.5 % (ref 43.0–77.0)
Platelets: 282 10*3/uL (ref 150.0–400.0)
RBC: 4.18 Mil/uL — ABNORMAL LOW (ref 4.22–5.81)
RDW: 14.8 % (ref 11.5–15.5)
WBC: 4.8 10*3/uL (ref 4.0–10.5)

## 2022-03-26 LAB — LIPID PANEL
Cholesterol: 150 mg/dL (ref 0–200)
HDL: 51.4 mg/dL (ref >39.00)
LDL Cholesterol: 79 mg/dL (ref 0–99)
NonHDL: 98.1
Total CHOL/HDL Ratio: 3
Triglycerides: 94 mg/dL (ref 0.0–149.0)
VLDL: 18.8 mg/dL (ref 0.0–40.0)

## 2022-03-26 MED ORDER — ATORVASTATIN CALCIUM 20 MG PO TABS: 20.0000 mg | ORAL_TABLET | ORAL | 3 refills | Status: DC

## 2022-03-26 MED ORDER — AMLODIPINE BESYLATE 5 MG PO TABS: 5.0000 mg | ORAL_TABLET | Freq: Every day | ORAL | 3 refills | Status: DC

## 2022-03-26 NOTE — Progress Notes (Unsigned)
PSA per urology.  I'll defer.  His son was dx'd with prostate cancer.    Hypertension:    Using medication without problems or lightheadedness: yes Chest pain with exertion:no Edema:no Short of breath: h/o lobectomy, at baseline.    Elevated Cholesterol: Using medications without problems: yes Muscle aches: not from statin.  Diet compliance: d/w pt.  Exercise: d/w pt.   Labs pending.   H/o TAVR, followed by cards.  Not having chest pain or shortness of breath.  Flu done Shingles discussed with patient, previously had Zostavax. PNA prev done.   Tetanus 2016 COVID-vaccine previously done Colonoscopy 2023 Prostate cancer screening per urology.  I will defer.  He agrees Advance directive- wife designated if patient were incapacitated  Psoriasis per derm, on MTX.  Sees Dr. Evorn Gong.   We can send dermatology copy of his labs.  Meds, vitals, and allergies reviewed.   ROS: Per HPI unless specifically indicated in ROS section   GEN: nad, alert and oriented HEENT: ncat NECK: supple w/o LA CV: rrr.  PULM: ctab, no inc wob ABD: soft, +bs EXT: no edema SKIN: he had some dry skin on the R foot w/o active plaque at exam.

## 2022-03-26 NOTE — Patient Instructions (Addendum)
Go to the lab on the way out.   If you have mychart we'll likely use that to update you.    Take care.  Glad to see you. 

## 2022-03-28 DIAGNOSIS — Z8042 Family history of malignant neoplasm of prostate: Secondary | ICD-10-CM | POA: Insufficient documentation

## 2022-03-28 NOTE — Assessment & Plan Note (Signed)
Per dermatology. 

## 2022-03-28 NOTE — Assessment & Plan Note (Signed)
Advance directive- wife designated if patient were incapacitated.  

## 2022-03-28 NOTE — Assessment & Plan Note (Signed)
Flu done Shingles discussed with patient, previously had Zostavax. PNA prev done.   Tetanus 2016 COVID-vaccine previously done Colonoscopy 2023 Prostate cancer screening per urology.  I will defer.  He agrees Advance directive- wife designated if patient were incapacitated

## 2022-03-28 NOTE — Assessment & Plan Note (Signed)
Doing well without chest pain or shortness of breath.  No lower extremity edema.

## 2022-03-28 NOTE — Assessment & Plan Note (Signed)
See notes on labs.  Continue atorvastatin. 

## 2022-03-28 NOTE — Assessment & Plan Note (Signed)
PSA per urology.  I'll defer.  His son was dx'd with prostate cancer.

## 2022-03-28 NOTE — Assessment & Plan Note (Signed)
See notes on labs.  Continue amlodipine. °

## 2022-03-30 ENCOUNTER — Telehealth: Payer: Self-pay | Admitting: Family Medicine

## 2022-03-30 DIAGNOSIS — M545 Low back pain, unspecified: Secondary | ICD-10-CM | POA: Diagnosis not present

## 2022-03-30 DIAGNOSIS — M4306 Spondylolysis, lumbar region: Secondary | ICD-10-CM | POA: Diagnosis not present

## 2022-03-30 DIAGNOSIS — M9903 Segmental and somatic dysfunction of lumbar region: Secondary | ICD-10-CM | POA: Diagnosis not present

## 2022-03-30 DIAGNOSIS — M9905 Segmental and somatic dysfunction of pelvic region: Secondary | ICD-10-CM | POA: Diagnosis not present

## 2022-03-30 NOTE — Telephone Encounter (Signed)
I get the point that it happened after the vaccine but that would be an atypical response.  There has also been a GI illness in the community recently.  Glad he is better, please update [place] as needed.  Thanks.

## 2022-03-30 NOTE — Telephone Encounter (Signed)
Patient called in just to give Dr Damita Dunnings a FYI that after receiving a flu shot on Friday 03/26/2022,he had an adverse reaction. He got really fatigued and had diarrhea. He's better now but just wanted him to know,because he said that it was a new one that he got this year.

## 2022-03-31 NOTE — Telephone Encounter (Signed)
Called patient reviewed all information and repeated back to me. Will call if any questions.  ? ?

## 2022-04-27 DIAGNOSIS — M545 Low back pain, unspecified: Secondary | ICD-10-CM | POA: Diagnosis not present

## 2022-04-27 DIAGNOSIS — M4306 Spondylolysis, lumbar region: Secondary | ICD-10-CM | POA: Diagnosis not present

## 2022-04-27 DIAGNOSIS — M9903 Segmental and somatic dysfunction of lumbar region: Secondary | ICD-10-CM | POA: Diagnosis not present

## 2022-04-27 DIAGNOSIS — M9905 Segmental and somatic dysfunction of pelvic region: Secondary | ICD-10-CM | POA: Diagnosis not present

## 2022-05-13 DIAGNOSIS — L538 Other specified erythematous conditions: Secondary | ICD-10-CM | POA: Diagnosis not present

## 2022-05-13 DIAGNOSIS — L820 Inflamed seborrheic keratosis: Secondary | ICD-10-CM | POA: Diagnosis not present

## 2022-05-13 DIAGNOSIS — D2271 Melanocytic nevi of right lower limb, including hip: Secondary | ICD-10-CM | POA: Diagnosis not present

## 2022-05-13 DIAGNOSIS — L2089 Other atopic dermatitis: Secondary | ICD-10-CM | POA: Diagnosis not present

## 2022-05-13 DIAGNOSIS — Z85828 Personal history of other malignant neoplasm of skin: Secondary | ICD-10-CM | POA: Diagnosis not present

## 2022-05-13 DIAGNOSIS — Z85820 Personal history of malignant melanoma of skin: Secondary | ICD-10-CM | POA: Diagnosis not present

## 2022-05-13 DIAGNOSIS — D2261 Melanocytic nevi of right upper limb, including shoulder: Secondary | ICD-10-CM | POA: Diagnosis not present

## 2022-05-13 DIAGNOSIS — Z79899 Other long term (current) drug therapy: Secondary | ICD-10-CM | POA: Diagnosis not present

## 2022-05-25 DIAGNOSIS — M9903 Segmental and somatic dysfunction of lumbar region: Secondary | ICD-10-CM | POA: Diagnosis not present

## 2022-05-25 DIAGNOSIS — M545 Low back pain, unspecified: Secondary | ICD-10-CM | POA: Diagnosis not present

## 2022-05-25 DIAGNOSIS — M4306 Spondylolysis, lumbar region: Secondary | ICD-10-CM | POA: Diagnosis not present

## 2022-05-25 DIAGNOSIS — M9905 Segmental and somatic dysfunction of pelvic region: Secondary | ICD-10-CM | POA: Diagnosis not present

## 2022-05-31 ENCOUNTER — Other Ambulatory Visit: Payer: Medicare Other

## 2022-05-31 ENCOUNTER — Other Ambulatory Visit: Payer: Self-pay | Admitting: Urology

## 2022-05-31 DIAGNOSIS — R972 Elevated prostate specific antigen [PSA]: Secondary | ICD-10-CM

## 2022-06-01 LAB — PSA: Prostate Specific Ag, Serum: 11.8 ng/mL — ABNORMAL HIGH (ref 0.0–4.0)

## 2022-06-02 ENCOUNTER — Telehealth: Payer: Self-pay | Admitting: Urology

## 2022-06-02 NOTE — Telephone Encounter (Signed)
Dr. Lonna Cobb will look at the psa and we will call them for next step.

## 2022-06-02 NOTE — Telephone Encounter (Signed)
Pt LMOM asking about PSA level increase and what steps should be next.

## 2022-06-03 ENCOUNTER — Telehealth: Payer: Self-pay | Admitting: Urology

## 2022-06-03 NOTE — Telephone Encounter (Signed)
Notified patient wife  as instructed,  Patient wife states they will take about prostate biopsy and call back to schedule.

## 2022-06-03 NOTE — Telephone Encounter (Signed)
PSA level has increased to 11.8.  Prostate MRI in November 2023 showed no abnormality suspicious for prostate cancer.  The false-negative rate of an MRI is 15-20% meaning cancer can still be present with a negative study.  Recommend scheduling repeat prostate biopsy to evaluate further

## 2022-06-04 NOTE — Telephone Encounter (Signed)
Pt called back to schedule biopsy and results appt.  Please mail instructions.  Pt's computer is down, so no access to My Chart.

## 2022-06-04 NOTE — Telephone Encounter (Signed)
Instructions mailed.

## 2022-06-11 ENCOUNTER — Telehealth: Payer: Self-pay | Admitting: Cardiovascular Disease

## 2022-06-11 ENCOUNTER — Telehealth: Payer: Self-pay | Admitting: *Deleted

## 2022-06-11 NOTE — Telephone Encounter (Signed)
Patient stated he will need to get pre-op clearance for surgery at [facility] and wants call to Dr. Weston Settle office at [phone] to confirm clearance.

## 2022-06-11 NOTE — Telephone Encounter (Signed)
See clearance notes in the chart.

## 2022-06-11 NOTE — Telephone Encounter (Signed)
   Pre-operative Risk Assessment    Patient Name: Brett Mcguire  DOB: 06/16/1945 MRN: [id number]      Request for Surgical Clearance    Procedure:  Prostate Biopsy  Date of Surgery:  Clearance 06/23/22                                 Surgeon:  not listed Surgeon's Group or Practice Name:  [facility] Phone number:  [phone] Fax number:  [phone]   Type of Clearance Requested: Medical, Pharmacy stop Aspirin 5days prior     Type of Anesthesia:  Not Indicated   Additional requests/questions:    Signed, Narda Amber   06/11/2022, 3:28 PM

## 2022-06-11 NOTE — Telephone Encounter (Signed)
Pt has been scheduled for tele pre op appt 06/17/22 @ 3:20. Pt was added on due to med hold and procedure date.

## 2022-06-11 NOTE — Telephone Encounter (Signed)
   Name: Brett Mcguire  DOB: 1945/11/03  MRN: [id number]  Primary Cardiologist: Julien Nordmann, MD   Preoperative team, please contact this patient and set up a phone call appointment for further preoperative risk assessment. Please obtain consent and complete medication review. Thank you for your help.  I confirm that guidance regarding antiplatelet and oral anticoagulation therapy has been completed and, if necessary, noted below.  His aspirin may be held for 5 days prior to his procedure.  Please resume as soon as hemostasis is achieved   Ronney Asters, NP 06/11/2022, 3:47 PM [facility]

## 2022-06-11 NOTE — Telephone Encounter (Signed)
I called Dr. Weston Settle office per the note from pt's call today. I left message that we are going to need a pre op clearance faxed to our office [phone].

## 2022-06-11 NOTE — Telephone Encounter (Signed)
Pt has been scheduled for tele pre op appt 06/17/22 @ 3:20. Pt was added on due to med hold and procedure date.     Patient Consent for Virtual Visit        Brett Mcguire has provided verbal consent on 06/11/2022 for a virtual visit (video or telephone).   CONSENT FOR VIRTUAL VISIT FOR:  Brett Mcguire  By participating in this virtual visit I agree to the following:  I hereby voluntarily request, consent and authorize [facility] and its employed or contracted physicians, physician assistants, nurse practitioners or other licensed health care professionals (the Practitioner), to provide me with telemedicine health care services (the "Services") as deemed necessary by the treating Practitioner. I acknowledge and consent to receive the Services by the Practitioner via telemedicine. I understand that the telemedicine visit will involve communicating with the Practitioner through live audiovisual communication technology and the disclosure of certain medical information by electronic transmission. I acknowledge that I have been given the opportunity to request an in-person assessment or other available alternative prior to the telemedicine visit and am voluntarily participating in the telemedicine visit.  I understand that I have the right to withhold or withdraw my consent to the use of telemedicine in the course of my care at any time, without affecting my right to future care or treatment, and that the Practitioner or I may terminate the telemedicine visit at any time. I understand that I have the right to inspect all information obtained and/or recorded in the course of the telemedicine visit and may receive copies of available information for a reasonable fee.  I understand that some of the potential risks of receiving the Services via telemedicine include:  Delay or interruption in medical evaluation due to technological equipment failure or disruption; Information transmitted may not  be sufficient (e.g. poor resolution of images) to allow for appropriate medical decision making by the Practitioner; and/or  In rare instances, security protocols could fail, causing a breach of personal health information.  Furthermore, I acknowledge that it is my responsibility to provide information about my medical history, conditions and care that is complete and accurate to the best of my ability. I acknowledge that Practitioner's advice, recommendations, and/or decision may be based on factors not within their control, such as incomplete or inaccurate data provided by me or distortions of diagnostic images or specimens that may result from electronic transmissions. I understand that the practice of medicine is not an exact science and that Practitioner makes no warranties or guarantees regarding treatment outcomes. I acknowledge that a copy of this consent can be made available to me via my patient portal [facility] MyChart), or I can request a printed copy by calling the office of [facility].    I understand that my insurance will be billed for this visit.   I have read or had this consent read to me. I understand the contents of this consent, which adequately explains the benefits and risks of the Services being provided via telemedicine.  I have been provided ample opportunity to ask questions regarding this consent and the Services and have had my questions answered to my satisfaction. I give my informed consent for the services to be provided through the use of telemedicine in my medical care

## 2022-06-17 ENCOUNTER — Ambulatory Visit: Payer: Medicare Other | Attending: Cardiology

## 2022-06-17 DIAGNOSIS — Z01810 Encounter for preprocedural cardiovascular examination: Secondary | ICD-10-CM

## 2022-06-17 NOTE — Addendum Note (Signed)
Addended by: Tarri Fuller on: 06/17/2022 04:19 PM   Modules accepted: Orders

## 2022-06-17 NOTE — Progress Notes (Signed)
Virtual Visit via Telephone Note   Because of Brett Mcguire's co-morbid illnesses, he is at least at moderate risk for complications without adequate follow up.  This format is felt to be most appropriate for this patient at this time.  The patient did not have access to video technology/had technical difficulties with video requiring transitioning to audio format only (telephone).  All issues noted in this document were discussed and addressed.  No physical exam could be performed with this format.  Please refer to the patient's chart for his consent to telehealth for [facility].  Evaluation Performed:  Preoperative cardiovascular risk assessment _____________   Date:  06/17/2022   Patient ID:  Brett Mcguire, DOB 06/11/1945, MRN [id number] Patient Location:  Home Provider location:   Office  Primary Care Provider:  Joaquim Nam, MD Primary Cardiologist:  Julien Nordmann, MD  Chief Complaint / Patient Profile   77 y.o. y/o male with a h/o severe aortic stenosis status post TAVR, TIA, hypertension, chronic fatigue, hyperlipidemia, carotid artery stenosis, shortness of breath who is pending prostate biopsy and presents today for telephonic preoperative cardiovascular risk assessment.  History of Present Illness    Brett Mcguire is a 77 y.o. male who presents via audio/video conferencing for a telehealth visit today.  Pt was last seen in cardiology clinic on 09/28/21 by Dr. Mariah Milling.  At that time ALEJANDO KOPPER was doing well.  The patient is now pending procedure as outlined above. Since his last visit, he was having some increased fatigue, dizziness, and shortness of breath.  He walks his dogs 5 times a day.  He also works walking other peoples dogs.  Having to sit down and rest more frequently.  We reviewed his most recent echocardiogram which was September 2022 and TAVR valve was working well at that time.  We discussed repeating an echocardiogram before his prostate  biopsy.  We will try to get the echocardiogram done before his surgery next week, however our echo department has been extremely busy.  If we can get the echocardiogram done in time, surgery will have to be delayed.  He has scored above the 4 on the DASI.  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  He also does a lot of his own indoor work  Aspirin can be held for 5 days before the procedure and resume once hemostasis has been achieved.    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 Excell Seltzer and Dr. Cornelius Moras   Severe aortic stenosis    Past Surgical History:  Procedure Laterality Date   COLONOSCOPY  02/18/2000   Polyp, diverticulosis, repeat in 5 years   COLONOSCOPY  03/03/2006   Repeat  -  Divertics   COLONOSCOPY WITH PROPOFOL N/A 12/22/2017   Procedure:  COLONOSCOPY WITH PROPOFOL;  Surgeon: Pasty Spillers, MD;  Location: [facility] ENDOSCOPY;  Service: Endoscopy;  Laterality: N/A;   COLONOSCOPY WITH PROPOFOL N/A 01/22/2021   Procedure: COLONOSCOPY WITH PROPOFOL;  Surgeon: Wyline Mood, MD;  Location: [facility] ENDOSCOPY;  Service: Gastroenterology;  Laterality: N/A;   CYSTOSCOPY WITH INSERTION OF UROLIFT     Laminectomy/Discectomy  11/03/2005   L5/S1   Malignant melanoma excision  06/20/2003   Dr. Gwen Pounds   MRI  10/09/2005   Lumbar spine, bulging disc L5/S1   Right lobectomy  1989   Lung cancer   RIGHT/LEFT HEART CATH AND CORONARY ANGIOGRAPHY  Bilateral 09/06/2019   Procedure: RIGHT/LEFT HEART CATH AND CORONARY ANGIOGRAPHY;  Surgeon: Antonieta Iba, MD;  Location: [facility] INVASIVE CV LAB;  Service: Cardiovascular;  Laterality: Bilateral;   TEE WITHOUT CARDIOVERSION N/A 10/02/2019   Procedure: TRANSESOPHAGEAL ECHOCARDIOGRAM (TEE);  Surgeon: Tonny Bollman, MD;  Location: [facility];  Service: Open Heart Surgery;  Laterality: N/A;   TONSILLECTOMY AND ADENOIDECTOMY     TRANSCATHETER AORTIC VALVE REPLACEMENT, TRANSFEMORAL  10/02/2019   Transcatheter Aortic Valve Replacement - Percutaneous Left Transfemoral Approach   TRANSCATHETER AORTIC VALVE REPLACEMENT, TRANSFEMORAL N/A 10/02/2019   Procedure: TRANSCATHETER AORTIC VALVE REPLACEMENT, TRANSFEMORAL USING EDWARDS SAPIEN 3 ULTRA 26 MM THV.;  Surgeon: Tonny Bollman, MD;  Location[facility];  Service: Open Heart Surgery;  Laterality: N/A;    Allergies  Allergies  Allergen Reactions   Atorvastatin     Aches at 40mg  a day.    Ephedrine Other (See Comments)    BP spikes  Other reaction(s): Other (See Comments) BP spikes   Finasteride Diarrhea    diarrhea   Losartan     Cough.  Improved off med.     Spironolactone     Urinary frequency/nocturia.    Sulfa Antibiotics     Other reaction(s): Other (See Comments) REACTION: as child   Sulfonamide Derivatives Other (See Comments)    REACTION: as child   Tadalafil Other (See Comments)    heartburn Other reaction(s): Other (See Comments) heartburn   Alfuzosin Rash and Other (See Comments)    Skin rash, burning sensation of skin Other reaction(s): Other (See Comments) Skin rash, burning sensation of skin   Doxazosin Rash and Other (See Comments)    Skin rash, burning sensation of skin Other reaction(s): Other (See Comments) Skin rash, burning sensation of skin   Flomax [Tamsulosin Hcl] Rash    Rash   Rapaflo [Silodosin] Rash    rash    Home Medications    Prior to Admission medications   Medication Sig Start Date End Date Taking?  Authorizing Provider  amLODipine (NORVASC) 5 MG tablet Take 1 tablet (5 mg total) by mouth daily. 03/26/22   Joaquim Nam, MD  amoxicillin (AMOXIL) 500 MG capsule Take 2,000 mg by mouth as directed. Take 2,000 mg one hour prior to all dental visits.    [provider]  aspirin 81 MG chewable tablet Chew 1 tablet (81 mg total) by mouth daily. 10/04/19   Janetta Hora, PA-C  atorvastatin (LIPITOR) 20 MG tablet Take 1 tablet (20 mg total) by mouth every Monday, Wednesday, and Friday. 03/26/22   Joaquim Nam, MD  folic acid (FOLVITE) 1 MG tablet Take 1 mg by mouth See admin instructions. Take 1 tablet (1 mg) by mouth on 6 days in the week, do NOT take on Mondays. 08/25/19   [provider]  methotrexate 2.5 MG tablet Take 10 mg by mouth every  Monday.  08/25/19   [provider]  Polyethyl Glycol-Propyl Glycol (SYSTANE ULTRA) 0.4-0.3 % SOLN Place 1 drop into both eyes daily as needed (dry/irritated eyes.).    [provider]  Skin Protectants, Misc. (EUCERIN) cream Apply 1 application topically daily as needed for dry skin.    [provider]    Physical Exam    Vital Signs:  Daltyn Kurtzer Bouillon does not have vital signs available for review today.  Given telephonic nature of communication, physical exam is limited. AAOx3. NAD. Normal affect.  Speech and respirations are unlabored.  Accessory Clinical Findings    None  Assessment & Plan    1.  Preoperative Cardiovascular Risk Assessment:  Mr. Southward perioperative risk of a major cardiac event is 0.9% according to the Revised Cardiac Risk Index (RCRI).  Therefore, he is at low risk for perioperative complications.   His functional capacity is fair at 4.95 METs according to the Duke Activity Status Index (DASI). Recommendations: The patient requires an echocardiogram before a disposition can be made regarding surgical risk.                Antiplatelet and/or Anticoagulation  Recommendations: Aspirin can be held for 5 days prior to his surgery.  Please resume Aspirin post operatively when it is felt to be safe from a bleeding standpoint. (Would also get guidance from neuro or PCP since he has a history of stroke)  The patient was advised that if he develops new symptoms prior to surgery to contact our office to arrange for a follow-up visit, and he verbalized understanding.  A copy of this note will be routed to requesting surgeon.  Time:   Today, I have spent 7 minutes with the patient with telehealth technology discussing medical history, symptoms, and management plan.     Sharlene Dory, PA-C  06/17/2022, 3:20 PM

## 2022-06-18 ENCOUNTER — Telehealth: Payer: Self-pay | Admitting: Cardiovascular Disease

## 2022-06-18 ENCOUNTER — Telehealth: Payer: Self-pay | Admitting: *Deleted

## 2022-06-18 NOTE — Telephone Encounter (Signed)
I will update the requesting office the pt is going to need an echo. The soonest echo appt is 06/23/22 same day as pt's procedure. I will update all parties involved. Bx procedure may need to be pushed out a bit as pt needs echo before he can be cleared.

## 2022-06-18 NOTE — Telephone Encounter (Signed)
Update echo will be done on 06/23/22 @ 7:30 with hopes to not delay pt's procedure. I will update Dr. Lonna Cobb.

## 2022-06-18 NOTE — Telephone Encounter (Signed)
I s/w the pt who wanted to make sure that the echo and the Bx procedure that is for the same day should go off with any problems. I assured the pt that is our goal to not delay his procedure with Dr. Lonna Cobb. I have assured the pt that I have updated Dr. Lonna Cobb and that he is aware that he is going to need an echo. Pt thanked me for the help. I will update Dr. Lonna Cobb as s/w the pt today as well.

## 2022-06-18 NOTE — Telephone Encounter (Signed)
See 5/31 clearance encounter  Patient is following up requesting to speak with Okey Regal. He states he thinks he found a solution, patient prefers to speak directly with Okey Regal.

## 2022-06-22 DIAGNOSIS — M4306 Spondylolysis, lumbar region: Secondary | ICD-10-CM | POA: Diagnosis not present

## 2022-06-22 DIAGNOSIS — M545 Low back pain, unspecified: Secondary | ICD-10-CM | POA: Diagnosis not present

## 2022-06-22 DIAGNOSIS — M9903 Segmental and somatic dysfunction of lumbar region: Secondary | ICD-10-CM | POA: Diagnosis not present

## 2022-06-22 DIAGNOSIS — M9905 Segmental and somatic dysfunction of pelvic region: Secondary | ICD-10-CM | POA: Diagnosis not present

## 2022-06-23 ENCOUNTER — Ambulatory Visit (INDEPENDENT_AMBULATORY_CARE_PROVIDER_SITE_OTHER): Payer: Medicare Other | Admitting: Urology

## 2022-06-23 ENCOUNTER — Encounter: Payer: Self-pay | Admitting: Urology

## 2022-06-23 ENCOUNTER — Ambulatory Visit: Payer: Medicare Other | Attending: Cardiovascular Disease

## 2022-06-23 VITALS — BP 118/68 | HR 64 | Ht 71.0 in | Wt 164.0 lb

## 2022-06-23 DIAGNOSIS — C61 Malignant neoplasm of prostate: Secondary | ICD-10-CM | POA: Diagnosis not present

## 2022-06-23 DIAGNOSIS — Z2989 Encounter for other specified prophylactic measures: Secondary | ICD-10-CM | POA: Diagnosis not present

## 2022-06-23 DIAGNOSIS — R972 Elevated prostate specific antigen [PSA]: Secondary | ICD-10-CM | POA: Diagnosis not present

## 2022-06-23 DIAGNOSIS — Z01810 Encounter for preprocedural cardiovascular examination: Secondary | ICD-10-CM

## 2022-06-23 LAB — ECHOCARDIOGRAM COMPLETE
AV Mean grad: 6.5 mmHg
AV Peak grad: 14.1 mmHg
Ao pk vel: 1.88 m/s
Area-P 1/2: 3.48 cm2
S' Lateral: 3.1 cm

## 2022-06-23 MED ORDER — GENTAMICIN SULFATE 40 MG/ML IJ SOLN
80.0000 mg | Freq: Once | INTRAMUSCULAR | Status: AC
Start: 2022-06-23 — End: 2022-06-23
  Administered 2022-06-23: 80 mg via INTRAMUSCULAR

## 2022-06-23 MED ORDER — LEVOFLOXACIN 500 MG PO TABS
500.0000 mg | ORAL_TABLET | Freq: Once | ORAL | Status: AC
Start: 2022-06-23 — End: 2022-06-23
  Administered 2022-06-23: 500 mg via ORAL

## 2022-06-23 NOTE — Progress Notes (Signed)
   Prostate Biopsy Procedure   Informed consent was obtained after discussing risks/benefits of the procedure.  A time out was performed to ensure correct patient identity.  Pre-Procedure: - Last PSA Level: 05/31/2022-11.8 Lab Results  Component Value Date   PSA 9.61 (H) 02/21/2015   PSA 2.13 02/09/2006   PSA 2.13 02/07/2006   - Gentamicin given prophylactically - Levaquin 500 mg administered PO -Transrectal Ultrasound performed revealing a 61 gm prostate -Transition zone enlargement; no peripheral zone abnormalities noted  Procedure: - Prostate block performed using 10 cc 1% lidocaine and biopsies taken from sextant areas, a total of 12 under ultrasound guidance.  Post-Procedure: - Patient tolerated the procedure well - He was counseled to seek immediate medical attention if experiences any severe pain, significant bleeding, or fevers -Will contact him with biopsy results   Irineo Axon, MD

## 2022-07-07 ENCOUNTER — Ambulatory Visit (INDEPENDENT_AMBULATORY_CARE_PROVIDER_SITE_OTHER): Payer: Medicare Other | Admitting: Urology

## 2022-07-07 ENCOUNTER — Encounter: Payer: Self-pay | Admitting: Urology

## 2022-07-07 VITALS — BP 129/74 | HR 66 | Ht 71.0 in | Wt 164.0 lb

## 2022-07-07 DIAGNOSIS — C61 Malignant neoplasm of prostate: Secondary | ICD-10-CM | POA: Diagnosis not present

## 2022-07-07 NOTE — Progress Notes (Signed)
I, Brett Mcguire, acting as a Neurosurgeon for Brett Altes, MD., have documented all relevant documentation on the behalf of Brett Altes, MD, as directed by  Brett Altes, MD while in the presence of Brett Altes, MD.   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  Most recent biopsies performed in 2015, 2017 Last PSA 11/02/2021 was 10.7.  MRI November 2023 which showed a 49.5 cc gland    HPI: Brett Mcguire is a 77 y.o. male presents for prostate biopsy follow up.  Biopsy performed 06/23/22 for a PSA of 11.8. Prostate volume was 61 g and no echogenic abnormalities identified. Standard 12 core biopsies were performed. No significant post-biopsy complaints. Still notes small amounts of blood intermittently. Pathology: 6/6 left sided cores were benign. 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 RLB and RLA; Gleason 3+4 RM and RA.   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 Excell Seltzer and Dr. Cornelius Moras   Severe aortic stenosis     Surgical History: Past Surgical History:  Procedure Laterality Date   COLONOSCOPY  02/18/2000   Polyp, diverticulosis, repeat in 5 years   COLONOSCOPY  03/03/2006   Repeat  -  Divertics   COLONOSCOPY  WITH PROPOFOL N/A 12/22/2017   Procedure: COLONOSCOPY WITH PROPOFOL;  Surgeon: Pasty Spillers, MD;  Location: [facility] ENDOSCOPY;  Service: Endoscopy;  Laterality: N/A;   COLONOSCOPY WITH PROPOFOL N/A 01/22/2021   Procedure: COLONOSCOPY WITH PROPOFOL;  Surgeon: Wyline Mood, MD;  Location: [facility] ENDOSCOPY;  Service: Gastroenterology;  Laterality: N/A;   CYSTOSCOPY WITH INSERTION OF UROLIFT     Laminectomy/Discectomy  11/03/2005   L5/S1   Malignant melanoma excision  06/20/2003   Dr. Gwen Pounds   MRI  10/09/2005   Lumbar spine, bulging disc L5/S1   Right lobectomy  1989   Lung cancer   RIGHT/LEFT HEART CATH AND CORONARY ANGIOGRAPHY Bilateral 09/06/2019   Procedure: RIGHT/LEFT HEART CATH AND CORONARY ANGIOGRAPHY;  Surgeon: Antonieta Iba, MD;  Location: [facility] INVASIVE CV LAB;  Service: Cardiovascular;  Laterality: Bilateral;   TEE WITHOUT CARDIOVERSION N/A 10/02/2019   Procedure: TRANSESOPHAGEAL ECHOCARDIOGRAM (TEE);  Surgeon: Tonny Bollman, MD;  Location: [facility] OR;  Service: Open Heart Surgery;  Laterality: N/A;   TONSILLECTOMY AND ADENOIDECTOMY     TRANSCATHETER AORTIC VALVE REPLACEMENT, TRANSFEMORAL  10/02/2019   Transcatheter Aortic Valve Replacement - Percutaneous Left Transfemoral Approach   TRANSCATHETER AORTIC VALVE REPLACEMENT, TRANSFEMORAL N/A 10/02/2019   Procedure: TRANSCATHETER AORTIC VALVE REPLACEMENT, TRANSFEMORAL USING EDWARDS SAPIEN 3 ULTRA 26 MM THV.;  Surgeon: Tonny Bollman, MD;  Location: [facility] OR;  Service: Open Heart  Surgery;  Laterality: N/A;    Home Medications:  Allergies as of 01/29/2022       Reactions   Atorvastatin    Aches at 40mg  a day.    Ephedrine Other (See Comments)   BP spikes Other reaction(s): Other (See Comments) BP spikes   Finasteride Diarrhea   diarrhea   Losartan    Cough.  Improved off med.     Spironolactone    Urinary frequency/nocturia.    Sulfa Antibiotics    Other reaction(s): Other (See Comments) REACTION: as child   Sulfonamide  Derivatives Other (See Comments)   REACTION: as child   Tadalafil Other (See Comments)   heartburn Other reaction(s): Other (See Comments) heartburn   Alfuzosin Rash, Other (See Comments)   Skin rash, burning sensation of skin Other reaction(s): Other (See Comments) Skin rash, burning sensation of skin   Doxazosin Rash, Other (See Comments)   Skin rash, burning sensation of skin Other reaction(s): Other (See Comments) Skin rash, burning sensation of skin   Flomax [tamsulosin Hcl] Rash   Rash   Rapaflo [silodosin] Rash   rash        Medication List        Accurate as of January 29, 2022 11:01 AM. If you have any questions, ask your nurse or doctor.          amLODipine 10 MG tablet Commonly known as: NORVASC Take 0.5-1 tablets (5-10 mg total) by mouth daily.   amoxicillin 500 MG capsule Commonly known as: AMOXIL Take 2,000 mg by mouth as directed. Take 2,000 mg one hour prior to all dental visits.   aspirin 81 MG chewable tablet Chew 1 tablet (81 mg total) by mouth daily.   atorvastatin 40 MG tablet Commonly known as: LIPITOR Take 20 mg by mouth every Monday, Wednesday, and Friday.   eucerin cream Apply 1 application topically daily as needed for dry skin.   folic acid 1 MG tablet Commonly known as: FOLVITE Take 1 mg by mouth See admin instructions. Take 1 tablet (1 mg) by mouth on 6 days in the week, do NOT take on Mondays.   methotrexate 2.5 MG tablet Commonly known as: RHEUMATREX Take 10 mg by mouth every Monday.   Systane Ultra 0.4-0.3 % Soln Generic drug: Polyethyl Glycol-Propyl Glycol Place 1 drop into both eyes daily as needed (dry/irritated eyes.).        Allergies:  Allergies  Allergen Reactions   Atorvastatin     Aches at 40mg  a day.    Ephedrine Other (See Comments)    BP spikes  Other reaction(s): Other (See Comments) BP spikes   Finasteride Diarrhea    diarrhea   Losartan     Cough.  Improved off med.     Spironolactone      Urinary frequency/nocturia.    Sulfa Antibiotics     Other reaction(s): Other (See Comments) REACTION: as child   Sulfonamide Derivatives Other (See Comments)    REACTION: as child   Tadalafil Other (See Comments)    heartburn Other reaction(s): Other (See Comments) heartburn   Alfuzosin Rash and Other (See Comments)    Skin rash, burning sensation of skin Other reaction(s): Other (See Comments) Skin rash, burning sensation of skin   Doxazosin Rash and Other (See Comments)    Skin rash, burning sensation of skin Other reaction(s): Other (See Comments) Skin rash, burning sensation of skin   Flomax [Tamsulosin Hcl] Rash    Rash   Rapaflo [Silodosin] Rash  rash    Family History: Family History  Problem Relation Age of Onset   Cancer Mother        [place] throat   Alcohol abuse Mother    Cancer Father        4 kinds; 5 places, prostate and lung   Prostate cancer Father        possible prostate cancer   Cancer Other        Skin, deceased in 1994-07-28   Colon cancer Neg Hx        none known    Social History:  reports that he quit smoking about 39 years ago. His smoking use included cigarettes. He has a 34.00 pack-year smoking history. He has never used smokeless tobacco. He reports that he does not drink alcohol and does not use drugs.   Physical Exam: BP (!) 162/73   Pulse 80   Ht 5\' 8"  (1.727 m)   Wt 163 lb (73.9 kg)   BMI 24.78 kg/m   Constitutional:  Alert and oriented, No acute distress. HEENT: [place] AT Respiratory: Normal respiratory effort, no increased work of breathing. Psychiatric: Normal mood and affect.   Assessment & Plan:    1. 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 We discussed treatment options of intermediate risk cancer, including radical prostatectomy and radiation modalities (IMRT and brachytherapy). I discussed that there is an increased incidence of complications in patients older than age 27 with radical  prostatectomy, including a higher incontinence risk. We also discussed there is not considered a best treatment option for prostate cancer and surgery/radiation treatments are generally considered equivalent. Based on unfavorable intermediate risk, will order PSMA/PET. He is primarily interested in radiation therapy and referral was sent to radiation oncology.  I have reviewed the above documentation for accuracy and completeness, and I agree with the above.   Brett Altes, MD  [facility] [address] [place], [place] [address] [phone]

## 2022-07-09 ENCOUNTER — Encounter: Payer: Self-pay | Admitting: Urology

## 2022-07-11 IMAGING — CT CT CHEST W/O CM
2 of 4 series · 15 of 36 positions shown, 18 images · non-contrast
Comparison: 03/04/2020, 02/21/2019

CLINICAL DATA: Lung nodule, < 6mm, high cancer risk



[Series 2: thorax · axial · 0.77mm/px · z∈[-571,-277]mm · 12 of 175 slices shown, 15 images]
[im 14/175  mediastinal]
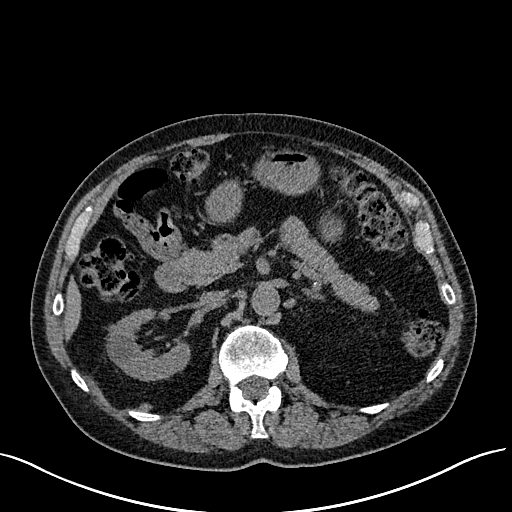
[im 14/175  lung]
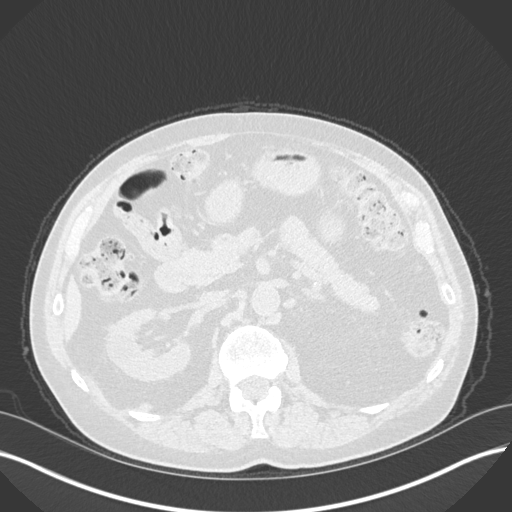
[im 27/175  lung]
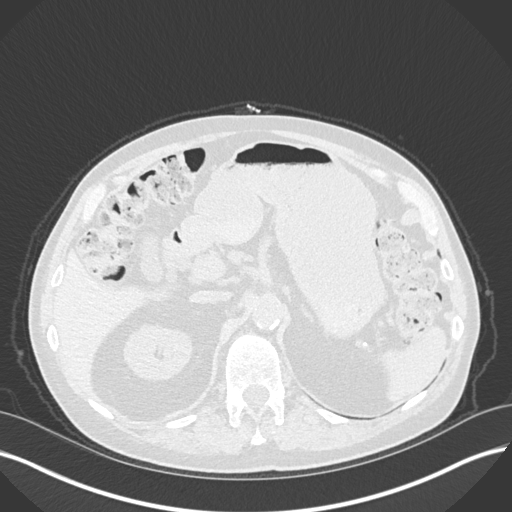
[im 41/175  lung]
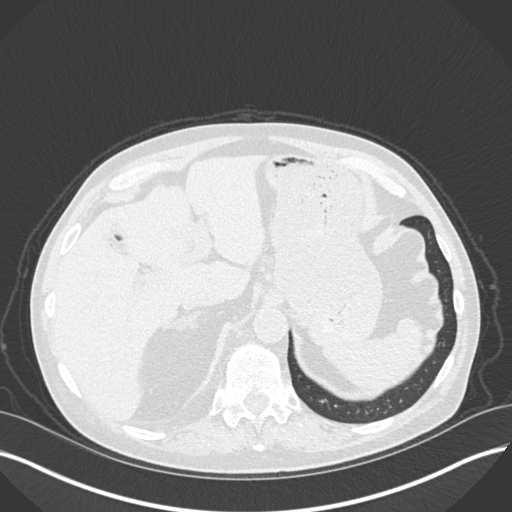
[im 54/175  lung]
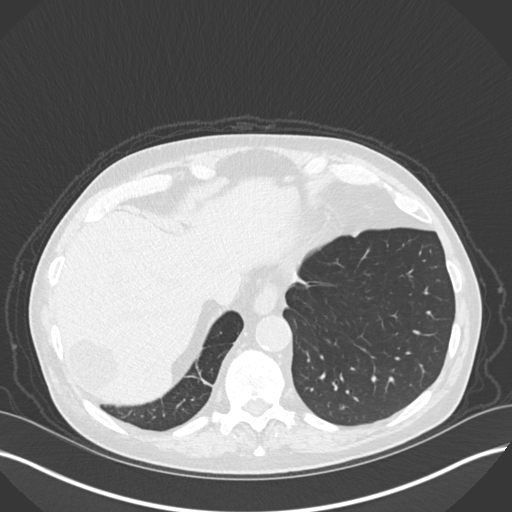
[im 67/175  mediastinal]
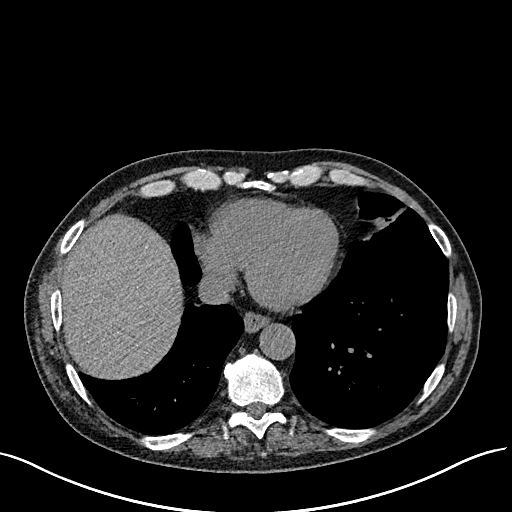
[im 67/175  lung]
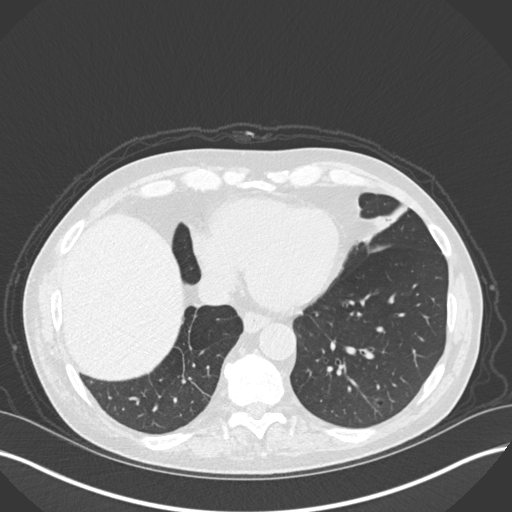
[im 81/175  lung]
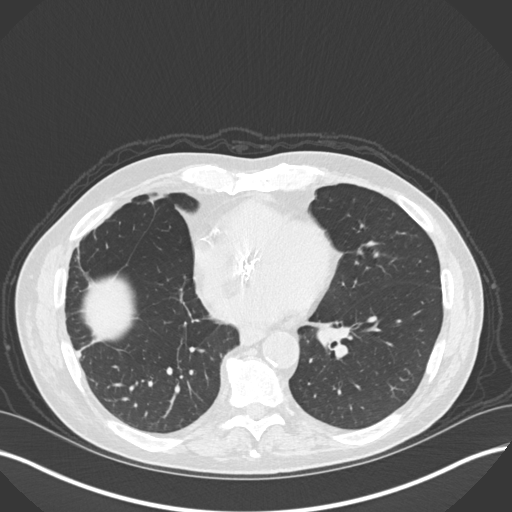
[im 94/175  lung]
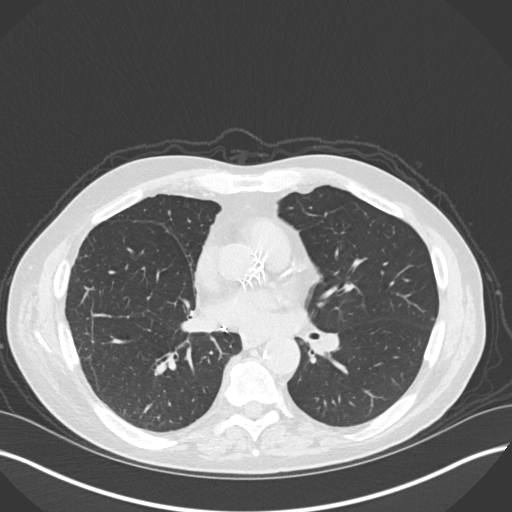
[im 108/175  lung]
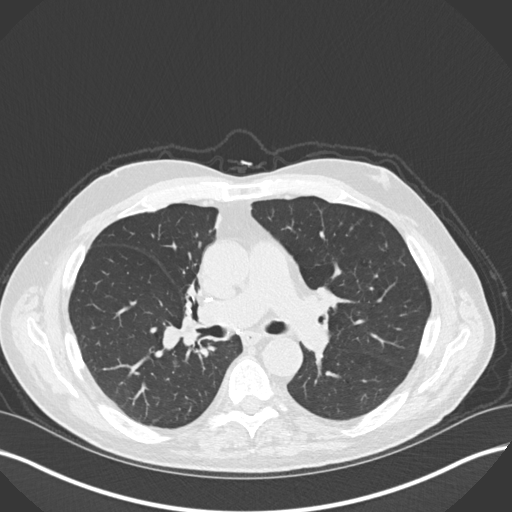
[im 121/175  mediastinal]
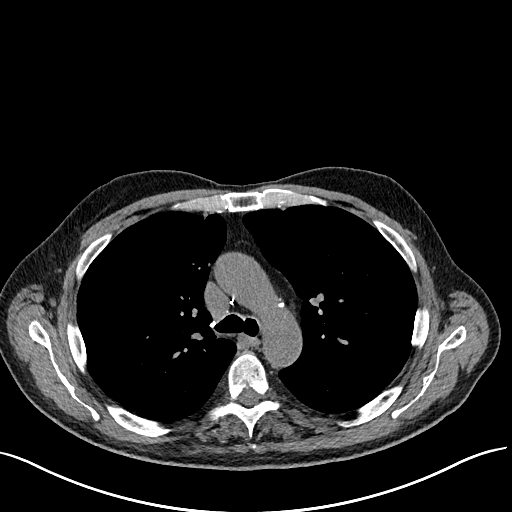
[im 121/175  lung]
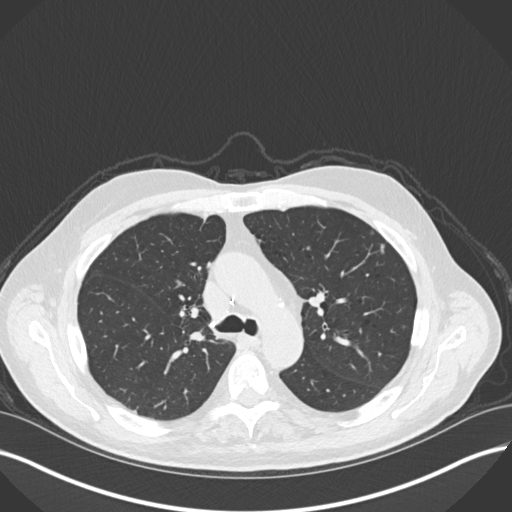
[im 134/175  lung]
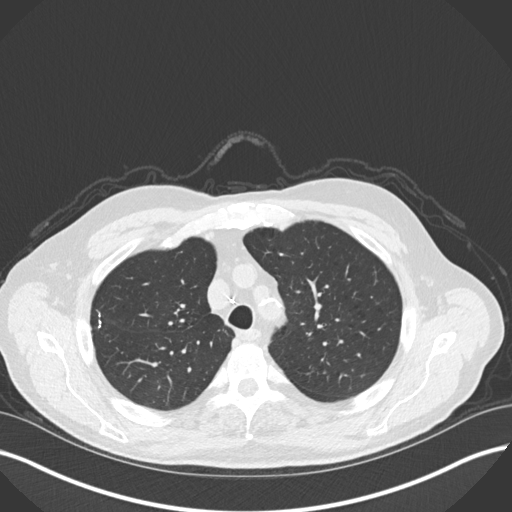
[im 148/175  lung]
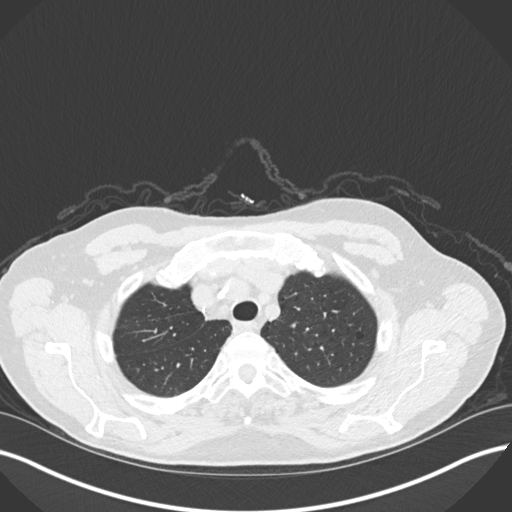
[im 161/175  lung]
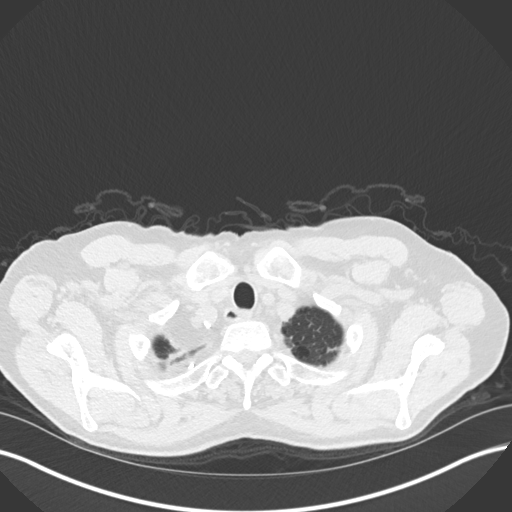

[Series 5: coronal · coronal · 0.70mm/px · 3 of 132 slices shown]
[im 27/132  lung]
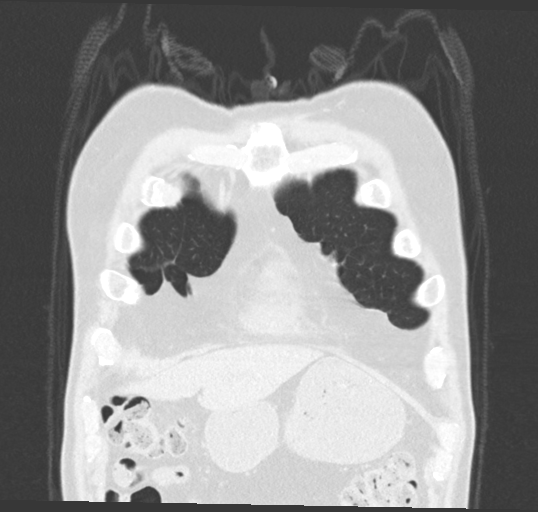
[im 53/132  lung]
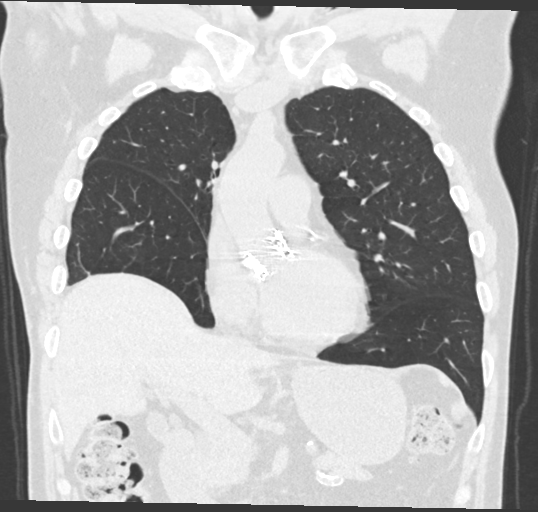
[im 79/132  lung]
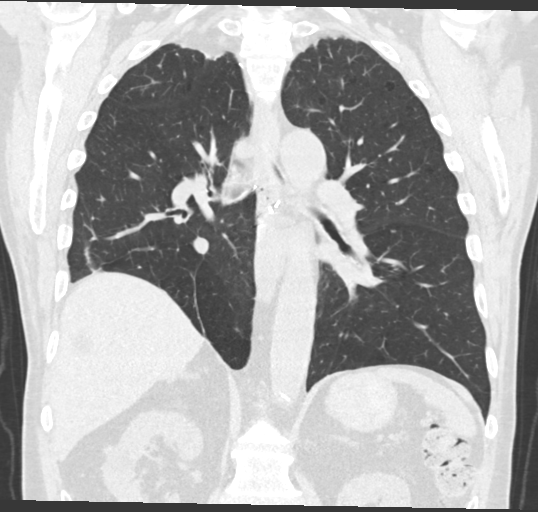

[15 of 36 positions shown; findings below may reference images not displayed]

FINDINGS: Cardiovascular: Coronary artery and aortic calcifications. Prior
aortic valve repair. Heart is normal size. Aorta normal caliber.

Mediastinum/Nodes: No mediastinal, hilar, or axillary adenopathy.
Trachea and esophagus are unremarkable. Thyroid unremarkable.
Surgical clips in the mediastinum, unchanged.

Lungs/Pleura: Biapical pleural-parenchymal scarring. Mild
centrilobular emphysema. Prior right upper lobectomy. A few
scattered peripheral pulmonary nodules in the left upper lobe are
unchanged, 3 mm or less in size. These are stable dating back to
February 2019 compatible with benign process. No effusions. Scarring
in the lung bases.

Upper Abdomen: Stable right hepatic cyst. No acute findings in the
upper abdomen.

Musculoskeletal: Chest wall soft tissues are unremarkable. No acute
bony abnormality.
IMPRESSION: Few small scattered peripheral 3 mm or smaller left upper lobe
pulmonary nodules. These have been stable for 2 years compatible
with benign nodules.

Postoperative changes from right upper lobectomy.

Biapical and bibasilar scarring.

Coronary artery disease.

Aortic Atherosclerosis (ONL1E-F5I.I) and Emphysema (ONL1E-OXO.W).

## 2022-07-19 ENCOUNTER — Ambulatory Visit
Admission: RE | Admit: 2022-07-19 | Discharge: 2022-07-19 | Disposition: A | Discharge status: Home / Self Care | Payer: Medicare Other | Source: Ambulatory Visit | Attending: Urology | Admitting: Urology

## 2022-07-19 DIAGNOSIS — C61 Malignant neoplasm of prostate: Secondary | ICD-10-CM | POA: Insufficient documentation

## 2022-07-19 MED ORDER — PIFLIFOLASTAT F 18 (PYLARIFY) INJECTION
9.0000 | Freq: Once | INTRAVENOUS | Status: AC
  Administered 2022-07-19: 9.23 via INTRAVENOUS

## 2022-07-20 DIAGNOSIS — M4306 Spondylolysis, lumbar region: Secondary | ICD-10-CM | POA: Diagnosis not present

## 2022-07-20 DIAGNOSIS — M9903 Segmental and somatic dysfunction of lumbar region: Secondary | ICD-10-CM | POA: Diagnosis not present

## 2022-07-20 DIAGNOSIS — M545 Low back pain, unspecified: Secondary | ICD-10-CM | POA: Diagnosis not present

## 2022-07-20 DIAGNOSIS — M9905 Segmental and somatic dysfunction of pelvic region: Secondary | ICD-10-CM | POA: Diagnosis not present

## 2022-07-22 ENCOUNTER — Encounter: Payer: Self-pay | Admitting: Radiation Oncology

## 2022-07-22 ENCOUNTER — Ambulatory Visit
Admission: RE | Admit: 2022-07-22 | Discharge: 2022-07-22 | Disposition: A | Discharge status: Home / Self Care | Payer: Medicare Other | Source: Ambulatory Visit | Attending: Radiation Oncology | Admitting: Radiation Oncology

## 2022-07-22 VITALS — BP 117/82 | HR 64 | Temp 97.5°F | Resp 16 | Ht 68.0 in | Wt 167.0 lb

## 2022-07-22 DIAGNOSIS — Z7982 Long term (current) use of aspirin: Secondary | ICD-10-CM | POA: Insufficient documentation

## 2022-07-22 DIAGNOSIS — Z191 Hormone sensitive malignancy status: Secondary | ICD-10-CM | POA: Diagnosis not present

## 2022-07-22 DIAGNOSIS — Z8042 Family history of malignant neoplasm of prostate: Secondary | ICD-10-CM | POA: Diagnosis not present

## 2022-07-22 DIAGNOSIS — Z87891 Personal history of nicotine dependence: Secondary | ICD-10-CM | POA: Diagnosis not present

## 2022-07-22 DIAGNOSIS — I10 Essential (primary) hypertension: Secondary | ICD-10-CM | POA: Diagnosis not present

## 2022-07-22 DIAGNOSIS — R351 Nocturia: Secondary | ICD-10-CM | POA: Insufficient documentation

## 2022-07-22 DIAGNOSIS — R350 Frequency of micturition: Secondary | ICD-10-CM | POA: Insufficient documentation

## 2022-07-22 DIAGNOSIS — Z85118 Personal history of other malignant neoplasm of bronchus and lung: Secondary | ICD-10-CM | POA: Diagnosis not present

## 2022-07-22 DIAGNOSIS — Z79899 Other long term (current) drug therapy: Secondary | ICD-10-CM | POA: Diagnosis not present

## 2022-07-22 DIAGNOSIS — E785 Hyperlipidemia, unspecified: Secondary | ICD-10-CM | POA: Insufficient documentation

## 2022-07-22 DIAGNOSIS — Z85828 Personal history of other malignant neoplasm of skin: Secondary | ICD-10-CM | POA: Insufficient documentation

## 2022-07-22 DIAGNOSIS — C61 Malignant neoplasm of prostate: Secondary | ICD-10-CM | POA: Diagnosis not present

## 2022-07-22 NOTE — Consult Note (Signed)
NEW PATIENT EVALUATION  Name: Brett Mcguire  MRN: [id number]  Date:   07/22/2022     DOB: 1945/07/02   This 77 y.o. 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  REFERRING PHYSICIAN: Joaquim Nam, MD  CHIEF COMPLAINT:  Chief Complaint  Patient presents with   Prostate Cancer    DIAGNOSIS: The encounter diagnosis was Prostate cancer [facility]).   PREVIOUS INVESTIGATIONS:  PSMA PET scan reviewed Pathology report reviewed Clinical notes reviewed  HPI: Patient is a 77 year old male who has been followed by Dr. Sheppard Penton since 2015.  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  His PSA climbed to 10.7 back in November 2023 showing no findings of intermediate to high-grade risk for prostate cancer.  His PSA in June 2024 was 11.8 he underwent prostate biopsy showing 4 of 12 cores positive for Gleason 7 (4+3) adenocarcinoma.  He had a PSMA PET scan showing only increased uptake in the prostate no evidence of bone metastasis or lymph node involvement.  He is seen today for radiation oncology evaluation.  He does have frequency of urination as well as nocturia he states he is allergic to most urologic medications.  Patient is on methotrexate for skin lesions.  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    PAST SURGICAL HISTORY:  Past Surgical History:   Procedure Laterality Date   COLONOSCOPY  02/18/2000   Polyp, diverticulosis, repeat in 5 years   COLONOSCOPY  03/03/2006   Repeat  -  Divertics   COLONOSCOPY WITH PROPOFOL N/A 12/22/2017   Procedure: COLONOSCOPY WITH PROPOFOL;  Surgeon: Pasty Spillers, MD;  Location: [facility] ENDOSCOPY;  Service: Endoscopy;  Laterality: N/A;   COLONOSCOPY WITH PROPOFOL N/A 01/22/2021   Procedure: COLONOSCOPY WITH PROPOFOL;  Surgeon: Wyline Mood, MD;  Location: [facility] ENDOSCOPY;  Service: Gastroenterology;  Laterality: N/A;   CYSTOSCOPY WITH INSERTION OF UROLIFT     Laminectomy/Discectomy  11/03/2005   L5/S1   Malignant melanoma excision  06/20/2003   Dr. Gwen Pounds   MRI  10/09/2005   Lumbar spine, bulging disc L5/S1   Right lobectomy  1989   Lung cancer   RIGHT/LEFT HEART CATH AND CORONARY ANGIOGRAPHY Bilateral 09/06/2019   Procedure: RIGHT/LEFT HEART CATH AND CORONARY ANGIOGRAPHY;  Surgeon: Antonieta Iba, MD;  Location: [facility] INVASIVE CV LAB;  Service: Cardiovascular;  Laterality: Bilateral;   TEE WITHOUT CARDIOVERSION N/A 10/02/2019   Procedure: TRANSESOPHAGEAL ECHOCARDIOGRAM (TEE);  Surgeon: Tonny Bollman, MD;  Location: [facility] OR;  Service: Open Heart Surgery;  Laterality: N/A;   TONSILLECTOMY AND ADENOIDECTOMY     TRANSCATHETER AORTIC VALVE REPLACEMENT, TRANSFEMORAL  10/02/2019   Transcatheter Aortic Valve Replacement - Percutaneous Left Transfemoral Approach   TRANSCATHETER AORTIC VALVE REPLACEMENT, TRANSFEMORAL N/A 10/02/2019   Procedure: TRANSCATHETER AORTIC VALVE REPLACEMENT, TRANSFEMORAL USING EDWARDS SAPIEN 3 ULTRA 26 MM THV.;  Surgeon: Tonny Bollman, MD;  Location: [facility] OR;  Service: Open Heart Surgery;  Laterality: N/A;  FAMILY HISTORY: family history includes Alcohol abuse in his mother; Cancer in his father, mother, and another family member; Prostate cancer in his father.  SOCIAL HISTORY:  reports that he quit smoking about 40 years ago. His smoking use included cigarettes. He started  smoking about 57 years ago. He has a 34 pack-year smoking history. He has never used smokeless tobacco. He reports that he does not drink alcohol and does not use drugs.  ALLERGIES: Atorvastatin, Ephedrine, Finasteride, Losartan, Spironolactone, Sulfa antibiotics, Sulfonamide derivatives, Tadalafil, Alfuzosin, Doxazosin, Flomax [tamsulosin hcl], and Rapaflo [silodosin]  MEDICATIONS:  Current Outpatient Medications  Medication Sig Dispense Refill   amLODipine (NORVASC) 5 MG tablet Take 1 tablet (5 mg total) by mouth daily. 90 tablet 3   amoxicillin (AMOXIL) 500 MG capsule Take 2,000 mg by mouth as directed. Take 2,000 mg one hour prior to all dental visits.     aspirin 81 MG chewable tablet Chew 1 tablet (81 mg total) by mouth daily.     atorvastatin (LIPITOR) 20 MG tablet Take 1 tablet (20 mg total) by mouth every Monday, Wednesday, and Friday. 45 tablet 3   folic acid (FOLVITE) 1 MG tablet Take 1 mg by mouth See admin instructions. Take 1 tablet (1 mg) by mouth on 6 days in the week, do NOT take on Mondays.     methotrexate 2.5 MG tablet Take 10 mg by mouth every Monday.      Polyethyl Glycol-Propyl Glycol (SYSTANE ULTRA) 0.4-0.3 % SOLN Place 1 drop into both eyes daily as needed (dry/irritated eyes.).     Skin Protectants, Misc. (EUCERIN) cream Apply 1 application topically daily as needed for dry skin.     No current facility-administered medications for this encounter.    ECOG PERFORMANCE STATUS:  1 - Symptomatic but completely ambulatory  REVIEW OF SYSTEMS: Patient denies any weight loss, fatigue, weakness, fever, chills or night sweats. Patient denies any loss of vision, blurred vision. Patient denies any ringing  of the ears or hearing loss. No irregular heartbeat. Patient denies heart murmur or history of fainting. Patient denies any chest pain or pain radiating to her upper extremities. Patient denies any shortness of breath, difficulty breathing at night, cough or hemoptysis. Patient  denies any swelling in the lower legs. Patient denies any nausea vomiting, vomiting of blood, or coffee ground material in the vomitus. Patient denies any stomach pain. Patient states has had normal bowel movements no significant constipation or diarrhea. Patient denies any dysuria, hematuria or significant nocturia. Patient denies any problems walking, swelling in the joints or loss of balance. Patient denies any skin changes, loss of hair or loss of weight. Patient denies any excessive worrying or anxiety or significant depression. Patient denies any problems with insomnia. Patient denies excessive thirst, polyuria, polydipsia. Patient denies any swollen glands, patient denies easy bruising or easy bleeding. Patient denies any recent infections, allergies or URI. Patient "s visual fields have not changed significantly in recent time.   PHYSICAL EXAM: BP 117/82   Pulse 64   Temp (!) 97.5 F (36.4 C) (Tympanic)   Resp 16   Ht 5\' 8"  (1.727 m)   Wt 167 lb (75.8 kg)   BMI 25.39 kg/m  Well-developed well-nourished patient in NAD. HEENT reveals PERLA, EOMI, discs not visualized.  Oral cavity is clear. No oral mucosal lesions are identified. Neck is clear without evidence of cervical or supraclavicular adenopathy. Lungs are clear to A&P. Cardiac examination is essentially unremarkable with regular rate and rhythm without murmur  rub or thrill. Abdomen is benign with no organomegaly or masses noted. Motor sensory and DTR levels are equal and symmetric in the upper and lower extremities. Cranial nerves II through XII are grossly intact. Proprioception is intact. No peripheral adenopathy or edema is identified. No motor or sensory levels are noted. Crude visual fields are within normal range.  LABORATORY DATA: Pathology reports reviewed    RADIOLOGY RESULTS: PSMA PET scan and MRI scan reviewed compatible with above-stated findings   IMPRESSION: Stage IIc Gleason 7 adenocarcinoma the prostate with a PSA in  the 11 range and 77 year old male  PLAN: I have run the [facility] nomogram showing only a 15% chance of pelvic lymph node involvement.  I will not opt to treat his pelvic nodes.  I would plan on delivering 39 Gray to the area of PET positivity in his prostate treating the remainder of his prostate to 80 Gray over 8 weeks using IMRT dose painting technique.  Risks and benefits of treatment including 6 increased lower urinary tract symptoms diarrhea fatigue alteration of blood counts all were reviewed in detail with the patient.  There is a slight increase chance of skin reaction based on him being on methotrexate although with the IMRT technique I believe that will not be an issue.  I have asked Dr. Lonna Cobb to place fiducial markers in his prostate for daily image guided treatment and to start him on 68-month depot of Eligard.  Will set up a CT simulation after that is complete.  Patient comprehends my recommendations well.  I would like to take this opportunity to thank you for allowing me to participate in the care of your patient.Carmina Miller, MD

## 2022-08-06 ENCOUNTER — Telehealth: Payer: Self-pay

## 2022-08-06 NOTE — Telephone Encounter (Signed)
-----   Message from Nurse Cala Bradford D sent at 08/03/2022 10:56 AM EDT ----- Regarding: Markers and Eligard Good morning,  This patient is going to need Markers placed and Eligard injection. He has the markers with him.  Thanks, Ricki Rodriguez

## 2022-08-06 NOTE — Telephone Encounter (Signed)
PA for Eligard initiated with [place], [place] form faxed. Awaiting response.

## 2022-08-17 DIAGNOSIS — M545 Low back pain, unspecified: Secondary | ICD-10-CM | POA: Diagnosis not present

## 2022-08-17 DIAGNOSIS — M4306 Spondylolysis, lumbar region: Secondary | ICD-10-CM | POA: Diagnosis not present

## 2022-08-17 DIAGNOSIS — M9903 Segmental and somatic dysfunction of lumbar region: Secondary | ICD-10-CM | POA: Diagnosis not present

## 2022-08-17 DIAGNOSIS — M9905 Segmental and somatic dysfunction of pelvic region: Secondary | ICD-10-CM | POA: Diagnosis not present

## 2022-08-18 ENCOUNTER — Encounter: Payer: Medicare Other | Admitting: Urology

## 2022-08-19 DIAGNOSIS — Z191 Hormone sensitive malignancy status: Secondary | ICD-10-CM | POA: Diagnosis not present

## 2022-08-19 DIAGNOSIS — C61 Malignant neoplasm of prostate: Secondary | ICD-10-CM | POA: Diagnosis not present

## 2022-08-24 ENCOUNTER — Ambulatory Visit: Payer: Medicare Other

## 2022-08-27 NOTE — Telephone Encounter (Signed)
Information faxed to [facility] again due to no initial response.

## 2022-09-01 ENCOUNTER — Ambulatory Visit: Payer: Medicare Other

## 2022-09-02 ENCOUNTER — Ambulatory Visit: Payer: Medicare Other

## 2022-09-03 ENCOUNTER — Ambulatory Visit: Payer: Medicare Other

## 2022-09-06 ENCOUNTER — Ambulatory Visit: Payer: Medicare Other

## 2022-09-07 ENCOUNTER — Ambulatory Visit: Payer: Medicare Other

## 2022-09-08 ENCOUNTER — Ambulatory Visit: Payer: Medicare Other

## 2022-09-09 ENCOUNTER — Ambulatory Visit: Payer: Medicare Other

## 2022-09-10 ENCOUNTER — Ambulatory Visit: Payer: Medicare Other

## 2022-09-14 ENCOUNTER — Ambulatory Visit: Payer: Medicare Other

## 2022-09-14 DIAGNOSIS — M9905 Segmental and somatic dysfunction of pelvic region: Secondary | ICD-10-CM | POA: Diagnosis not present

## 2022-09-14 DIAGNOSIS — M9903 Segmental and somatic dysfunction of lumbar region: Secondary | ICD-10-CM | POA: Diagnosis not present

## 2022-09-14 DIAGNOSIS — M4306 Spondylolysis, lumbar region: Secondary | ICD-10-CM | POA: Diagnosis not present

## 2022-09-14 DIAGNOSIS — M545 Low back pain, unspecified: Secondary | ICD-10-CM | POA: Diagnosis not present

## 2022-09-15 ENCOUNTER — Telehealth: Payer: Self-pay | Admitting: Urology

## 2022-09-15 ENCOUNTER — Ambulatory Visit: Payer: Medicare Other

## 2022-09-15 ENCOUNTER — Encounter: Payer: Medicare Other | Admitting: Urology

## 2022-09-15 NOTE — Telephone Encounter (Signed)
Called pt he states that he was able to leave a voicemail with his insurance company but ultimately did not get any new information. The patient would like to have appointment moved up sooner if possible if the Eligard injection being covered is not an issue.

## 2022-09-15 NOTE — Telephone Encounter (Signed)
Pt left message on machine asking about Eligard and insurance not approving.

## 2022-09-16 ENCOUNTER — Ambulatory Visit: Payer: Medicare Other

## 2022-09-17 ENCOUNTER — Ambulatory Visit: Payer: Medicare Other

## 2022-09-20 ENCOUNTER — Ambulatory Visit: Payer: Medicare Other

## 2022-09-21 ENCOUNTER — Ambulatory Visit: Payer: Medicare Other

## 2022-09-21 NOTE — Telephone Encounter (Signed)
Moved appt to 9/19 at 11 am. Pt aware.

## 2022-09-22 ENCOUNTER — Ambulatory Visit: Payer: Medicare Other

## 2022-09-23 ENCOUNTER — Ambulatory Visit: Payer: Medicare Other

## 2022-09-24 ENCOUNTER — Ambulatory Visit: Payer: Medicare Other

## 2022-09-27 ENCOUNTER — Ambulatory Visit: Payer: Medicare Other

## 2022-09-28 ENCOUNTER — Ambulatory Visit: Payer: Medicare Other

## 2022-09-29 ENCOUNTER — Ambulatory Visit: Payer: Medicare Other

## 2022-09-30 ENCOUNTER — Encounter: Payer: Self-pay | Admitting: Urology

## 2022-09-30 ENCOUNTER — Ambulatory Visit (INDEPENDENT_AMBULATORY_CARE_PROVIDER_SITE_OTHER): Payer: Medicare Other | Admitting: Urology

## 2022-09-30 ENCOUNTER — Ambulatory Visit: Payer: Medicare Other

## 2022-09-30 ENCOUNTER — Other Ambulatory Visit: Payer: Self-pay | Admitting: Radiation Oncology

## 2022-09-30 VITALS — BP 156/76 | HR 62 | Ht 68.0 in | Wt 167.0 lb

## 2022-09-30 DIAGNOSIS — Z2989 Encounter for other specified prophylactic measures: Secondary | ICD-10-CM | POA: Diagnosis not present

## 2022-09-30 DIAGNOSIS — C801 Malignant (primary) neoplasm, unspecified: Secondary | ICD-10-CM

## 2022-09-30 DIAGNOSIS — C61 Malignant neoplasm of prostate: Secondary | ICD-10-CM | POA: Diagnosis not present

## 2022-09-30 MED ORDER — LEUPROLIDE ACETATE (6 MONTH) 45 MG ~~LOC~~ KIT
45.0000 mg | PACK | Freq: Once | SUBCUTANEOUS | Status: AC
Start: 2022-09-30 — End: 2022-09-30
  Administered 2022-09-30: 45 mg via SUBCUTANEOUS

## 2022-09-30 MED ORDER — LEVOFLOXACIN 500 MG PO TABS
500.0000 mg | ORAL_TABLET | Freq: Once | ORAL | Status: AC
Start: 2022-09-30 — End: 2022-09-30
  Administered 2022-09-30: 500 mg via ORAL

## 2022-09-30 MED ORDER — GENTAMICIN SULFATE 40 MG/ML IJ SOLN
80.0000 mg | Freq: Once | INTRAMUSCULAR | Status: AC
Start: 2022-09-30 — End: 2022-09-30
  Administered 2022-09-30: 80 mg via INTRAMUSCULAR

## 2022-09-30 NOTE — Progress Notes (Signed)
Eligard SubQ Injection   Due to Prostate Cancer patient is present today for a Eligard Injection.  Medication: Eligard 6 month Dose: 45 mg  Location: right arm Lot: [id number] Exp: 03/2024  Patient tolerated well, no complications were noted.  Performed by: Debbe Bales, CMA   Per Dr. Rushie Chestnut patient is to continue therapy for 6 months . Vitamin D 800-1000iu and Calcium 1000-1200mg  daily while on Androgen Deprivation Therapy.  PA approval dates: See telephone note.

## 2022-09-30 NOTE — Patient Instructions (Signed)

## 2022-10-01 ENCOUNTER — Ambulatory Visit: Payer: Medicare Other

## 2022-10-03 NOTE — Progress Notes (Signed)
   10/03/22  CC: gold fiducial marker placement  HPI: 77 y.o. male with prostate cancer who presents today for placement of fiducial seed markers in anticipation of his upcoming IMRT with Dr. Rushie Chestnut.  Prostate Gold fiducial Marker Placement Procedure   Informed consent was obtained after discussing risks/benefits of the procedure.  A time out was performed to ensure correct patient identity.  Pre-Procedure: - Gentamicin given prophylactically - PO Levaquin 500 mg also given today  Procedure: - Lidocaine jelly was administered per rectum - Rectal ultrasound probe was placed without difficulty and the prostate visualized - Prostatic block performed with 10 mL 1% Xylocaine - 3 fiducial gold seed markers placed, one at right base, one at left base, one at apex of prostate gland under transrectal ultrasound guidance  Post-Procedure: - Patient tolerated the procedure well - He was counseled to seek immediate medical attention if experiences any severe pain, significant bleeding, or fevers   Irineo Axon, MD

## 2022-10-04 ENCOUNTER — Ambulatory Visit: Payer: Medicare Other

## 2022-10-04 ENCOUNTER — Other Ambulatory Visit: Payer: Self-pay | Admitting: Radiation Oncology

## 2022-10-04 DIAGNOSIS — C61 Malignant neoplasm of prostate: Secondary | ICD-10-CM

## 2022-10-05 ENCOUNTER — Ambulatory Visit
Admission: RE | Admit: 2022-10-05 | Discharge: 2022-10-05 | Disposition: A | Discharge status: Home / Self Care | Payer: Medicare Other | Source: Ambulatory Visit | Attending: Radiation Oncology | Admitting: Radiation Oncology

## 2022-10-05 ENCOUNTER — Ambulatory Visit: Payer: Medicare Other

## 2022-10-05 DIAGNOSIS — I10 Essential (primary) hypertension: Secondary | ICD-10-CM | POA: Insufficient documentation

## 2022-10-05 DIAGNOSIS — E785 Hyperlipidemia, unspecified: Secondary | ICD-10-CM | POA: Diagnosis not present

## 2022-10-05 DIAGNOSIS — Z8042 Family history of malignant neoplasm of prostate: Secondary | ICD-10-CM | POA: Insufficient documentation

## 2022-10-05 DIAGNOSIS — Z85828 Personal history of other malignant neoplasm of skin: Secondary | ICD-10-CM | POA: Insufficient documentation

## 2022-10-05 DIAGNOSIS — C61 Malignant neoplasm of prostate: Secondary | ICD-10-CM

## 2022-10-05 DIAGNOSIS — Z191 Hormone sensitive malignancy status: Secondary | ICD-10-CM | POA: Diagnosis not present

## 2022-10-05 DIAGNOSIS — R351 Nocturia: Secondary | ICD-10-CM | POA: Diagnosis not present

## 2022-10-05 DIAGNOSIS — Z87891 Personal history of nicotine dependence: Secondary | ICD-10-CM | POA: Diagnosis not present

## 2022-10-05 DIAGNOSIS — Z85118 Personal history of other malignant neoplasm of bronchus and lung: Secondary | ICD-10-CM | POA: Diagnosis not present

## 2022-10-05 DIAGNOSIS — Z79899 Other long term (current) drug therapy: Secondary | ICD-10-CM | POA: Diagnosis not present

## 2022-10-05 DIAGNOSIS — Z7982 Long term (current) use of aspirin: Secondary | ICD-10-CM | POA: Insufficient documentation

## 2022-10-05 DIAGNOSIS — R350 Frequency of micturition: Secondary | ICD-10-CM | POA: Diagnosis not present

## 2022-10-06 ENCOUNTER — Ambulatory Visit: Payer: Medicare Other

## 2022-10-07 ENCOUNTER — Other Ambulatory Visit: Payer: Self-pay | Admitting: *Deleted

## 2022-10-07 ENCOUNTER — Encounter: Payer: Medicare Other | Admitting: Urology

## 2022-10-07 ENCOUNTER — Ambulatory Visit: Payer: Medicare Other

## 2022-10-07 DIAGNOSIS — C61 Malignant neoplasm of prostate: Secondary | ICD-10-CM

## 2022-10-08 ENCOUNTER — Ambulatory Visit: Payer: Medicare Other

## 2022-10-08 DIAGNOSIS — R350 Frequency of micturition: Secondary | ICD-10-CM | POA: Diagnosis not present

## 2022-10-08 DIAGNOSIS — E785 Hyperlipidemia, unspecified: Secondary | ICD-10-CM | POA: Diagnosis not present

## 2022-10-08 DIAGNOSIS — C61 Malignant neoplasm of prostate: Secondary | ICD-10-CM | POA: Diagnosis not present

## 2022-10-08 DIAGNOSIS — I10 Essential (primary) hypertension: Secondary | ICD-10-CM | POA: Diagnosis not present

## 2022-10-08 DIAGNOSIS — Z85118 Personal history of other malignant neoplasm of bronchus and lung: Secondary | ICD-10-CM | POA: Diagnosis not present

## 2022-10-08 DIAGNOSIS — Z191 Hormone sensitive malignancy status: Secondary | ICD-10-CM | POA: Diagnosis not present

## 2022-10-08 DIAGNOSIS — R351 Nocturia: Secondary | ICD-10-CM | POA: Diagnosis not present

## 2022-10-11 ENCOUNTER — Ambulatory Visit: Payer: Medicare Other

## 2022-10-12 ENCOUNTER — Ambulatory Visit: Payer: Medicare Other

## 2022-10-13 ENCOUNTER — Ambulatory Visit
Admission: RE | Admit: 2022-10-13 | Discharge: 2022-10-13 | Disposition: A | Discharge status: Home / Self Care | Payer: Medicare Other | Source: Ambulatory Visit | Attending: Radiation Oncology | Admitting: Radiation Oncology

## 2022-10-13 ENCOUNTER — Ambulatory Visit: Payer: Medicare Other

## 2022-10-13 DIAGNOSIS — C61 Malignant neoplasm of prostate: Secondary | ICD-10-CM | POA: Insufficient documentation

## 2022-10-13 DIAGNOSIS — R351 Nocturia: Secondary | ICD-10-CM | POA: Insufficient documentation

## 2022-10-13 DIAGNOSIS — Z85118 Personal history of other malignant neoplasm of bronchus and lung: Secondary | ICD-10-CM | POA: Insufficient documentation

## 2022-10-13 DIAGNOSIS — Z85828 Personal history of other malignant neoplasm of skin: Secondary | ICD-10-CM | POA: Insufficient documentation

## 2022-10-13 DIAGNOSIS — I10 Essential (primary) hypertension: Secondary | ICD-10-CM | POA: Insufficient documentation

## 2022-10-13 DIAGNOSIS — Z87891 Personal history of nicotine dependence: Secondary | ICD-10-CM | POA: Insufficient documentation

## 2022-10-13 DIAGNOSIS — Z79899 Other long term (current) drug therapy: Secondary | ICD-10-CM | POA: Insufficient documentation

## 2022-10-13 DIAGNOSIS — Z7982 Long term (current) use of aspirin: Secondary | ICD-10-CM | POA: Insufficient documentation

## 2022-10-13 DIAGNOSIS — E785 Hyperlipidemia, unspecified: Secondary | ICD-10-CM | POA: Insufficient documentation

## 2022-10-13 DIAGNOSIS — R350 Frequency of micturition: Secondary | ICD-10-CM | POA: Insufficient documentation

## 2022-10-13 DIAGNOSIS — Z8042 Family history of malignant neoplasm of prostate: Secondary | ICD-10-CM | POA: Insufficient documentation

## 2022-10-14 ENCOUNTER — Ambulatory Visit: Payer: Medicare Other

## 2022-10-15 ENCOUNTER — Ambulatory Visit: Payer: Medicare Other

## 2022-10-18 ENCOUNTER — Ambulatory Visit
Admission: RE | Admit: 2022-10-18 | Discharge: 2022-10-18 | Disposition: A | Discharge status: Home / Self Care | Payer: Medicare Other | Source: Ambulatory Visit | Attending: Radiation Oncology | Admitting: Radiation Oncology

## 2022-10-18 ENCOUNTER — Ambulatory Visit: Payer: Medicare Other

## 2022-10-18 ENCOUNTER — Other Ambulatory Visit: Payer: Self-pay

## 2022-10-18 DIAGNOSIS — Z87891 Personal history of nicotine dependence: Secondary | ICD-10-CM | POA: Diagnosis not present

## 2022-10-18 DIAGNOSIS — Z85118 Personal history of other malignant neoplasm of bronchus and lung: Secondary | ICD-10-CM | POA: Diagnosis not present

## 2022-10-18 DIAGNOSIS — Z8042 Family history of malignant neoplasm of prostate: Secondary | ICD-10-CM | POA: Diagnosis not present

## 2022-10-18 DIAGNOSIS — E785 Hyperlipidemia, unspecified: Secondary | ICD-10-CM | POA: Diagnosis not present

## 2022-10-18 DIAGNOSIS — C61 Malignant neoplasm of prostate: Secondary | ICD-10-CM | POA: Diagnosis not present

## 2022-10-18 DIAGNOSIS — Z79899 Other long term (current) drug therapy: Secondary | ICD-10-CM | POA: Diagnosis not present

## 2022-10-18 DIAGNOSIS — Z7982 Long term (current) use of aspirin: Secondary | ICD-10-CM | POA: Diagnosis not present

## 2022-10-18 DIAGNOSIS — R351 Nocturia: Secondary | ICD-10-CM | POA: Diagnosis not present

## 2022-10-18 DIAGNOSIS — Z85828 Personal history of other malignant neoplasm of skin: Secondary | ICD-10-CM | POA: Diagnosis not present

## 2022-10-18 DIAGNOSIS — R350 Frequency of micturition: Secondary | ICD-10-CM | POA: Diagnosis not present

## 2022-10-18 DIAGNOSIS — Z191 Hormone sensitive malignancy status: Secondary | ICD-10-CM | POA: Diagnosis not present

## 2022-10-18 DIAGNOSIS — H25813 Combined forms of age-related cataract, bilateral: Secondary | ICD-10-CM | POA: Diagnosis not present

## 2022-10-18 DIAGNOSIS — I10 Essential (primary) hypertension: Secondary | ICD-10-CM | POA: Diagnosis not present

## 2022-10-18 DIAGNOSIS — Z510 Encounter for antineoplastic radiation therapy: Secondary | ICD-10-CM | POA: Diagnosis not present

## 2022-10-18 LAB — RAD ONC ARIA SESSION SUMMARY
Course Elapsed Days: 0
Plan Fractions Treated to Date: 1
Plan Prescribed Dose Per Fraction: 2.075 Gy
Plan Total Fractions Prescribed: 40
Plan Total Prescribed Dose: 83 Gy
Reference Point Dosage Given to Date: 2.075 Gy
Reference Point Session Dosage Given: 2.075 Gy
Session Number: 1

## 2022-10-19 ENCOUNTER — Ambulatory Visit
Admission: RE | Admit: 2022-10-19 | Discharge: 2022-10-19 | Discharge status: Home / Self Care | Payer: Medicare Other | Source: Ambulatory Visit | Attending: Radiation Oncology | Admitting: Radiation Oncology

## 2022-10-19 ENCOUNTER — Other Ambulatory Visit: Payer: Self-pay

## 2022-10-19 ENCOUNTER — Ambulatory Visit: Payer: Medicare Other

## 2022-10-19 DIAGNOSIS — R350 Frequency of micturition: Secondary | ICD-10-CM | POA: Diagnosis not present

## 2022-10-19 DIAGNOSIS — E785 Hyperlipidemia, unspecified: Secondary | ICD-10-CM | POA: Diagnosis not present

## 2022-10-19 DIAGNOSIS — Z510 Encounter for antineoplastic radiation therapy: Secondary | ICD-10-CM | POA: Diagnosis not present

## 2022-10-19 DIAGNOSIS — I10 Essential (primary) hypertension: Secondary | ICD-10-CM | POA: Diagnosis not present

## 2022-10-19 DIAGNOSIS — C61 Malignant neoplasm of prostate: Secondary | ICD-10-CM | POA: Diagnosis not present

## 2022-10-19 DIAGNOSIS — Z85118 Personal history of other malignant neoplasm of bronchus and lung: Secondary | ICD-10-CM | POA: Diagnosis not present

## 2022-10-19 DIAGNOSIS — Z191 Hormone sensitive malignancy status: Secondary | ICD-10-CM | POA: Diagnosis not present

## 2022-10-19 DIAGNOSIS — R351 Nocturia: Secondary | ICD-10-CM | POA: Diagnosis not present

## 2022-10-19 LAB — RAD ONC ARIA SESSION SUMMARY
Course Elapsed Days: 1
Plan Fractions Treated to Date: 2
Plan Prescribed Dose Per Fraction: 2.075 Gy
Plan Total Fractions Prescribed: 40
Plan Total Prescribed Dose: 83 Gy
Reference Point Dosage Given to Date: 4.15 Gy
Reference Point Session Dosage Given: 2.075 Gy
Session Number: 2

## 2022-10-20 ENCOUNTER — Other Ambulatory Visit: Payer: Self-pay

## 2022-10-20 ENCOUNTER — Ambulatory Visit: Payer: Medicare Other

## 2022-10-20 ENCOUNTER — Ambulatory Visit
Admission: RE | Admit: 2022-10-20 | Discharge: 2022-10-20 | Discharge status: Home / Self Care | Payer: Medicare Other | Source: Ambulatory Visit | Attending: Radiation Oncology | Admitting: Radiation Oncology

## 2022-10-20 ENCOUNTER — Inpatient Hospital Stay: Payer: Medicare Other

## 2022-10-20 DIAGNOSIS — Z191 Hormone sensitive malignancy status: Secondary | ICD-10-CM | POA: Diagnosis not present

## 2022-10-20 DIAGNOSIS — M9903 Segmental and somatic dysfunction of lumbar region: Secondary | ICD-10-CM | POA: Diagnosis not present

## 2022-10-20 DIAGNOSIS — C61 Malignant neoplasm of prostate: Secondary | ICD-10-CM | POA: Insufficient documentation

## 2022-10-20 DIAGNOSIS — M9905 Segmental and somatic dysfunction of pelvic region: Secondary | ICD-10-CM | POA: Diagnosis not present

## 2022-10-20 DIAGNOSIS — M4306 Spondylolysis, lumbar region: Secondary | ICD-10-CM | POA: Diagnosis not present

## 2022-10-20 DIAGNOSIS — Z85118 Personal history of other malignant neoplasm of bronchus and lung: Secondary | ICD-10-CM | POA: Diagnosis not present

## 2022-10-20 DIAGNOSIS — E785 Hyperlipidemia, unspecified: Secondary | ICD-10-CM | POA: Diagnosis not present

## 2022-10-20 DIAGNOSIS — R350 Frequency of micturition: Secondary | ICD-10-CM | POA: Diagnosis not present

## 2022-10-20 DIAGNOSIS — M545 Low back pain, unspecified: Secondary | ICD-10-CM | POA: Diagnosis not present

## 2022-10-20 DIAGNOSIS — I10 Essential (primary) hypertension: Secondary | ICD-10-CM | POA: Diagnosis not present

## 2022-10-20 DIAGNOSIS — R351 Nocturia: Secondary | ICD-10-CM | POA: Diagnosis not present

## 2022-10-20 DIAGNOSIS — Z510 Encounter for antineoplastic radiation therapy: Secondary | ICD-10-CM | POA: Diagnosis not present

## 2022-10-20 LAB — RAD ONC ARIA SESSION SUMMARY
Course Elapsed Days: 2
Plan Fractions Treated to Date: 3
Plan Prescribed Dose Per Fraction: 2.075 Gy
Plan Total Fractions Prescribed: 40
Plan Total Prescribed Dose: 83 Gy
Reference Point Dosage Given to Date: 6.225 Gy
Reference Point Session Dosage Given: 2.075 Gy
Session Number: 3

## 2022-10-21 ENCOUNTER — Other Ambulatory Visit: Payer: Self-pay

## 2022-10-21 ENCOUNTER — Ambulatory Visit: Payer: Medicare Other

## 2022-10-21 ENCOUNTER — Ambulatory Visit
Admission: RE | Admit: 2022-10-21 | Discharge: 2022-10-21 | Disposition: A | Discharge status: Home / Self Care | Payer: Medicare Other | Source: Ambulatory Visit | Attending: Radiation Oncology | Admitting: Radiation Oncology

## 2022-10-21 DIAGNOSIS — Z85118 Personal history of other malignant neoplasm of bronchus and lung: Secondary | ICD-10-CM | POA: Diagnosis not present

## 2022-10-21 DIAGNOSIS — I10 Essential (primary) hypertension: Secondary | ICD-10-CM | POA: Diagnosis not present

## 2022-10-21 DIAGNOSIS — C61 Malignant neoplasm of prostate: Secondary | ICD-10-CM | POA: Diagnosis not present

## 2022-10-21 DIAGNOSIS — R351 Nocturia: Secondary | ICD-10-CM | POA: Diagnosis not present

## 2022-10-21 DIAGNOSIS — Z191 Hormone sensitive malignancy status: Secondary | ICD-10-CM | POA: Diagnosis not present

## 2022-10-21 DIAGNOSIS — E785 Hyperlipidemia, unspecified: Secondary | ICD-10-CM | POA: Diagnosis not present

## 2022-10-21 DIAGNOSIS — Z510 Encounter for antineoplastic radiation therapy: Secondary | ICD-10-CM | POA: Diagnosis not present

## 2022-10-21 DIAGNOSIS — R350 Frequency of micturition: Secondary | ICD-10-CM | POA: Diagnosis not present

## 2022-10-21 LAB — RAD ONC ARIA SESSION SUMMARY
Course Elapsed Days: 3
Plan Fractions Treated to Date: 4
Plan Prescribed Dose Per Fraction: 2.075 Gy
Plan Total Fractions Prescribed: 40
Plan Total Prescribed Dose: 83 Gy
Reference Point Dosage Given to Date: 8.3 Gy
Reference Point Session Dosage Given: 2.075 Gy
Session Number: 4

## 2022-10-22 ENCOUNTER — Ambulatory Visit: Payer: Medicare Other

## 2022-10-22 ENCOUNTER — Other Ambulatory Visit: Payer: Self-pay

## 2022-10-22 ENCOUNTER — Ambulatory Visit
Admission: RE | Admit: 2022-10-22 | Discharge: 2022-10-22 | Disposition: A | Discharge status: Home / Self Care | Payer: Medicare Other | Source: Ambulatory Visit | Attending: Radiation Oncology | Admitting: Radiation Oncology

## 2022-10-22 DIAGNOSIS — R350 Frequency of micturition: Secondary | ICD-10-CM | POA: Diagnosis not present

## 2022-10-22 DIAGNOSIS — Z510 Encounter for antineoplastic radiation therapy: Secondary | ICD-10-CM | POA: Diagnosis not present

## 2022-10-22 DIAGNOSIS — R351 Nocturia: Secondary | ICD-10-CM | POA: Diagnosis not present

## 2022-10-22 DIAGNOSIS — I10 Essential (primary) hypertension: Secondary | ICD-10-CM | POA: Diagnosis not present

## 2022-10-22 DIAGNOSIS — Z191 Hormone sensitive malignancy status: Secondary | ICD-10-CM | POA: Diagnosis not present

## 2022-10-22 DIAGNOSIS — E785 Hyperlipidemia, unspecified: Secondary | ICD-10-CM | POA: Diagnosis not present

## 2022-10-22 DIAGNOSIS — C61 Malignant neoplasm of prostate: Secondary | ICD-10-CM | POA: Diagnosis not present

## 2022-10-22 DIAGNOSIS — Z85118 Personal history of other malignant neoplasm of bronchus and lung: Secondary | ICD-10-CM | POA: Diagnosis not present

## 2022-10-22 LAB — RAD ONC ARIA SESSION SUMMARY
Course Elapsed Days: 4
Plan Fractions Treated to Date: 5
Plan Prescribed Dose Per Fraction: 2.075 Gy
Plan Total Fractions Prescribed: 40
Plan Total Prescribed Dose: 83 Gy
Reference Point Dosage Given to Date: 10.375 Gy
Reference Point Session Dosage Given: 2.075 Gy
Session Number: 5

## 2022-10-25 ENCOUNTER — Other Ambulatory Visit: Payer: Self-pay

## 2022-10-25 ENCOUNTER — Ambulatory Visit: Payer: Medicare Other

## 2022-10-25 ENCOUNTER — Ambulatory Visit
Admission: RE | Admit: 2022-10-25 | Discharge: 2022-10-25 | Disposition: A | Discharge status: Home / Self Care | Payer: Medicare Other | Source: Ambulatory Visit | Attending: Radiation Oncology | Admitting: Radiation Oncology

## 2022-10-25 DIAGNOSIS — Z85118 Personal history of other malignant neoplasm of bronchus and lung: Secondary | ICD-10-CM | POA: Diagnosis not present

## 2022-10-25 DIAGNOSIS — I10 Essential (primary) hypertension: Secondary | ICD-10-CM | POA: Diagnosis not present

## 2022-10-25 DIAGNOSIS — R351 Nocturia: Secondary | ICD-10-CM | POA: Diagnosis not present

## 2022-10-25 DIAGNOSIS — C61 Malignant neoplasm of prostate: Secondary | ICD-10-CM | POA: Diagnosis not present

## 2022-10-25 DIAGNOSIS — R350 Frequency of micturition: Secondary | ICD-10-CM | POA: Diagnosis not present

## 2022-10-25 DIAGNOSIS — Z510 Encounter for antineoplastic radiation therapy: Secondary | ICD-10-CM | POA: Diagnosis not present

## 2022-10-25 DIAGNOSIS — Z191 Hormone sensitive malignancy status: Secondary | ICD-10-CM | POA: Diagnosis not present

## 2022-10-25 DIAGNOSIS — E785 Hyperlipidemia, unspecified: Secondary | ICD-10-CM | POA: Diagnosis not present

## 2022-10-25 LAB — RAD ONC ARIA SESSION SUMMARY
Course Elapsed Days: 7
Plan Fractions Treated to Date: 6
Plan Prescribed Dose Per Fraction: 2.075 Gy
Plan Total Fractions Prescribed: 40
Plan Total Prescribed Dose: 83 Gy
Reference Point Dosage Given to Date: 12.45 Gy
Reference Point Session Dosage Given: 2.075 Gy
Session Number: 6

## 2022-10-26 ENCOUNTER — Ambulatory Visit
Admission: RE | Admit: 2022-10-26 | Discharge: 2022-10-26 | Disposition: A | Discharge status: Home / Self Care | Payer: Medicare Other | Source: Ambulatory Visit | Attending: Radiation Oncology | Admitting: Radiation Oncology

## 2022-10-26 ENCOUNTER — Ambulatory Visit: Payer: Medicare Other

## 2022-10-26 ENCOUNTER — Other Ambulatory Visit: Payer: Self-pay

## 2022-10-26 DIAGNOSIS — Z85118 Personal history of other malignant neoplasm of bronchus and lung: Secondary | ICD-10-CM | POA: Diagnosis not present

## 2022-10-26 DIAGNOSIS — R351 Nocturia: Secondary | ICD-10-CM | POA: Diagnosis not present

## 2022-10-26 DIAGNOSIS — I10 Essential (primary) hypertension: Secondary | ICD-10-CM | POA: Diagnosis not present

## 2022-10-26 DIAGNOSIS — Z510 Encounter for antineoplastic radiation therapy: Secondary | ICD-10-CM | POA: Diagnosis not present

## 2022-10-26 DIAGNOSIS — R350 Frequency of micturition: Secondary | ICD-10-CM | POA: Diagnosis not present

## 2022-10-26 DIAGNOSIS — C61 Malignant neoplasm of prostate: Secondary | ICD-10-CM | POA: Diagnosis not present

## 2022-10-26 DIAGNOSIS — E785 Hyperlipidemia, unspecified: Secondary | ICD-10-CM | POA: Diagnosis not present

## 2022-10-26 DIAGNOSIS — Z191 Hormone sensitive malignancy status: Secondary | ICD-10-CM | POA: Diagnosis not present

## 2022-10-26 LAB — RAD ONC ARIA SESSION SUMMARY
Course Elapsed Days: 8
Plan Fractions Treated to Date: 7
Plan Prescribed Dose Per Fraction: 2.075 Gy
Plan Total Fractions Prescribed: 40
Plan Total Prescribed Dose: 83 Gy
Reference Point Dosage Given to Date: 14.525 Gy
Reference Point Session Dosage Given: 2.075 Gy
Session Number: 7

## 2022-10-27 ENCOUNTER — Ambulatory Visit
Admission: RE | Admit: 2022-10-27 | Discharge: 2022-10-27 | Disposition: A | Discharge status: Home / Self Care | Payer: Medicare Other | Source: Ambulatory Visit | Attending: Radiation Oncology | Admitting: Radiation Oncology

## 2022-10-27 ENCOUNTER — Other Ambulatory Visit: Payer: Self-pay

## 2022-10-27 ENCOUNTER — Ambulatory Visit: Payer: Medicare Other

## 2022-10-27 DIAGNOSIS — Z191 Hormone sensitive malignancy status: Secondary | ICD-10-CM | POA: Diagnosis not present

## 2022-10-27 DIAGNOSIS — I10 Essential (primary) hypertension: Secondary | ICD-10-CM | POA: Diagnosis not present

## 2022-10-27 DIAGNOSIS — R351 Nocturia: Secondary | ICD-10-CM | POA: Diagnosis not present

## 2022-10-27 DIAGNOSIS — Z510 Encounter for antineoplastic radiation therapy: Secondary | ICD-10-CM | POA: Diagnosis not present

## 2022-10-27 DIAGNOSIS — Z85118 Personal history of other malignant neoplasm of bronchus and lung: Secondary | ICD-10-CM | POA: Diagnosis not present

## 2022-10-27 DIAGNOSIS — C61 Malignant neoplasm of prostate: Secondary | ICD-10-CM | POA: Diagnosis not present

## 2022-10-27 DIAGNOSIS — E785 Hyperlipidemia, unspecified: Secondary | ICD-10-CM | POA: Diagnosis not present

## 2022-10-27 DIAGNOSIS — R350 Frequency of micturition: Secondary | ICD-10-CM | POA: Diagnosis not present

## 2022-10-27 LAB — RAD ONC ARIA SESSION SUMMARY
Course Elapsed Days: 9
Plan Fractions Treated to Date: 8
Plan Prescribed Dose Per Fraction: 2.075 Gy
Plan Total Fractions Prescribed: 40
Plan Total Prescribed Dose: 83 Gy
Reference Point Dosage Given to Date: 16.6 Gy
Reference Point Session Dosage Given: 2.075 Gy
Session Number: 8

## 2022-10-28 ENCOUNTER — Ambulatory Visit: Payer: Medicare Other

## 2022-10-28 ENCOUNTER — Other Ambulatory Visit: Payer: Self-pay

## 2022-10-28 ENCOUNTER — Ambulatory Visit
Admission: RE | Admit: 2022-10-28 | Discharge: 2022-10-28 | Disposition: A | Discharge status: Home / Self Care | Payer: Medicare Other | Source: Ambulatory Visit | Attending: Radiation Oncology | Admitting: Radiation Oncology

## 2022-10-28 DIAGNOSIS — R350 Frequency of micturition: Secondary | ICD-10-CM | POA: Diagnosis not present

## 2022-10-28 DIAGNOSIS — E785 Hyperlipidemia, unspecified: Secondary | ICD-10-CM | POA: Diagnosis not present

## 2022-10-28 DIAGNOSIS — Z191 Hormone sensitive malignancy status: Secondary | ICD-10-CM | POA: Diagnosis not present

## 2022-10-28 DIAGNOSIS — R351 Nocturia: Secondary | ICD-10-CM | POA: Diagnosis not present

## 2022-10-28 DIAGNOSIS — Z85118 Personal history of other malignant neoplasm of bronchus and lung: Secondary | ICD-10-CM | POA: Diagnosis not present

## 2022-10-28 DIAGNOSIS — Z510 Encounter for antineoplastic radiation therapy: Secondary | ICD-10-CM | POA: Diagnosis not present

## 2022-10-28 DIAGNOSIS — I10 Essential (primary) hypertension: Secondary | ICD-10-CM | POA: Diagnosis not present

## 2022-10-28 DIAGNOSIS — C61 Malignant neoplasm of prostate: Secondary | ICD-10-CM | POA: Diagnosis not present

## 2022-10-28 LAB — RAD ONC ARIA SESSION SUMMARY
Course Elapsed Days: 10
Plan Fractions Treated to Date: 9
Plan Prescribed Dose Per Fraction: 2.075 Gy
Plan Total Fractions Prescribed: 40
Plan Total Prescribed Dose: 83 Gy
Reference Point Dosage Given to Date: 18.675 Gy
Reference Point Session Dosage Given: 2.075 Gy
Session Number: 9

## 2022-10-29 ENCOUNTER — Ambulatory Visit
Admission: RE | Admit: 2022-10-29 | Discharge: 2022-10-29 | Disposition: A | Discharge status: Home / Self Care | Payer: Medicare Other | Source: Ambulatory Visit | Attending: Radiation Oncology | Admitting: Radiation Oncology

## 2022-10-29 ENCOUNTER — Other Ambulatory Visit: Payer: Self-pay

## 2022-10-29 DIAGNOSIS — R351 Nocturia: Secondary | ICD-10-CM | POA: Diagnosis not present

## 2022-10-29 DIAGNOSIS — Z191 Hormone sensitive malignancy status: Secondary | ICD-10-CM | POA: Diagnosis not present

## 2022-10-29 DIAGNOSIS — Z510 Encounter for antineoplastic radiation therapy: Secondary | ICD-10-CM | POA: Diagnosis not present

## 2022-10-29 DIAGNOSIS — E785 Hyperlipidemia, unspecified: Secondary | ICD-10-CM | POA: Diagnosis not present

## 2022-10-29 DIAGNOSIS — R350 Frequency of micturition: Secondary | ICD-10-CM | POA: Diagnosis not present

## 2022-10-29 DIAGNOSIS — Z85118 Personal history of other malignant neoplasm of bronchus and lung: Secondary | ICD-10-CM | POA: Diagnosis not present

## 2022-10-29 DIAGNOSIS — I10 Essential (primary) hypertension: Secondary | ICD-10-CM | POA: Diagnosis not present

## 2022-10-29 DIAGNOSIS — C61 Malignant neoplasm of prostate: Secondary | ICD-10-CM | POA: Diagnosis not present

## 2022-10-29 LAB — RAD ONC ARIA SESSION SUMMARY
Course Elapsed Days: 11
Plan Fractions Treated to Date: 10
Plan Prescribed Dose Per Fraction: 2.075 Gy
Plan Total Fractions Prescribed: 40
Plan Total Prescribed Dose: 83 Gy
Reference Point Dosage Given to Date: 20.75 Gy
Reference Point Session Dosage Given: 2.075 Gy
Session Number: 10

## 2022-11-01 ENCOUNTER — Ambulatory Visit
Admission: RE | Admit: 2022-11-01 | Discharge: 2022-11-01 | Disposition: A | Discharge status: Home / Self Care | Payer: Medicare Other | Source: Ambulatory Visit | Attending: Radiation Oncology | Admitting: Radiation Oncology

## 2022-11-01 ENCOUNTER — Other Ambulatory Visit: Payer: Self-pay

## 2022-11-01 DIAGNOSIS — Z191 Hormone sensitive malignancy status: Secondary | ICD-10-CM | POA: Diagnosis not present

## 2022-11-01 DIAGNOSIS — I10 Essential (primary) hypertension: Secondary | ICD-10-CM | POA: Diagnosis not present

## 2022-11-01 DIAGNOSIS — Z85118 Personal history of other malignant neoplasm of bronchus and lung: Secondary | ICD-10-CM | POA: Diagnosis not present

## 2022-11-01 DIAGNOSIS — E785 Hyperlipidemia, unspecified: Secondary | ICD-10-CM | POA: Diagnosis not present

## 2022-11-01 DIAGNOSIS — Z510 Encounter for antineoplastic radiation therapy: Secondary | ICD-10-CM | POA: Diagnosis not present

## 2022-11-01 DIAGNOSIS — C61 Malignant neoplasm of prostate: Secondary | ICD-10-CM | POA: Diagnosis not present

## 2022-11-01 DIAGNOSIS — R350 Frequency of micturition: Secondary | ICD-10-CM | POA: Diagnosis not present

## 2022-11-01 DIAGNOSIS — R351 Nocturia: Secondary | ICD-10-CM | POA: Diagnosis not present

## 2022-11-01 LAB — RAD ONC ARIA SESSION SUMMARY
Course Elapsed Days: 14
Plan Fractions Treated to Date: 11
Plan Prescribed Dose Per Fraction: 2.075 Gy
Plan Total Fractions Prescribed: 40
Plan Total Prescribed Dose: 83 Gy
Reference Point Dosage Given to Date: 22.825 Gy
Reference Point Session Dosage Given: 2.075 Gy
Session Number: 11

## 2022-11-02 ENCOUNTER — Other Ambulatory Visit: Payer: Self-pay

## 2022-11-02 ENCOUNTER — Ambulatory Visit
Admission: RE | Admit: 2022-11-02 | Discharge: 2022-11-02 | Disposition: A | Discharge status: Home / Self Care | Payer: Medicare Other | Source: Ambulatory Visit | Attending: Radiation Oncology | Admitting: Radiation Oncology

## 2022-11-02 DIAGNOSIS — Z510 Encounter for antineoplastic radiation therapy: Secondary | ICD-10-CM | POA: Diagnosis not present

## 2022-11-02 DIAGNOSIS — R351 Nocturia: Secondary | ICD-10-CM | POA: Diagnosis not present

## 2022-11-02 DIAGNOSIS — Z191 Hormone sensitive malignancy status: Secondary | ICD-10-CM | POA: Diagnosis not present

## 2022-11-02 DIAGNOSIS — Z85118 Personal history of other malignant neoplasm of bronchus and lung: Secondary | ICD-10-CM | POA: Diagnosis not present

## 2022-11-02 DIAGNOSIS — I10 Essential (primary) hypertension: Secondary | ICD-10-CM | POA: Diagnosis not present

## 2022-11-02 DIAGNOSIS — E785 Hyperlipidemia, unspecified: Secondary | ICD-10-CM | POA: Diagnosis not present

## 2022-11-02 DIAGNOSIS — R350 Frequency of micturition: Secondary | ICD-10-CM | POA: Diagnosis not present

## 2022-11-02 DIAGNOSIS — C61 Malignant neoplasm of prostate: Secondary | ICD-10-CM | POA: Diagnosis not present

## 2022-11-02 LAB — RAD ONC ARIA SESSION SUMMARY
Course Elapsed Days: 15
Plan Fractions Treated to Date: 12
Plan Prescribed Dose Per Fraction: 2.075 Gy
Plan Total Fractions Prescribed: 40
Plan Total Prescribed Dose: 83 Gy
Reference Point Dosage Given to Date: 24.9 Gy
Reference Point Session Dosage Given: 2.075 Gy
Session Number: 12

## 2022-11-03 ENCOUNTER — Inpatient Hospital Stay: Payer: Medicare Other

## 2022-11-03 ENCOUNTER — Ambulatory Visit
Admission: RE | Admit: 2022-11-03 | Discharge: 2022-11-03 | Disposition: A | Discharge status: Home / Self Care | Payer: Medicare Other | Source: Ambulatory Visit | Attending: Radiation Oncology | Admitting: Radiation Oncology

## 2022-11-03 ENCOUNTER — Other Ambulatory Visit: Payer: Self-pay

## 2022-11-03 DIAGNOSIS — I10 Essential (primary) hypertension: Secondary | ICD-10-CM | POA: Diagnosis not present

## 2022-11-03 DIAGNOSIS — R350 Frequency of micturition: Secondary | ICD-10-CM | POA: Diagnosis not present

## 2022-11-03 DIAGNOSIS — Z191 Hormone sensitive malignancy status: Secondary | ICD-10-CM | POA: Diagnosis not present

## 2022-11-03 DIAGNOSIS — Z510 Encounter for antineoplastic radiation therapy: Secondary | ICD-10-CM | POA: Diagnosis not present

## 2022-11-03 DIAGNOSIS — C61 Malignant neoplasm of prostate: Secondary | ICD-10-CM | POA: Diagnosis not present

## 2022-11-03 DIAGNOSIS — Z85118 Personal history of other malignant neoplasm of bronchus and lung: Secondary | ICD-10-CM | POA: Diagnosis not present

## 2022-11-03 DIAGNOSIS — R351 Nocturia: Secondary | ICD-10-CM | POA: Diagnosis not present

## 2022-11-03 DIAGNOSIS — E785 Hyperlipidemia, unspecified: Secondary | ICD-10-CM | POA: Diagnosis not present

## 2022-11-03 LAB — CBC (CANCER CENTER ONLY)
HCT: 35.9 % — ABNORMAL LOW (ref 39.0–52.0)
Hemoglobin: 12.2 g/dL — ABNORMAL LOW (ref 13.0–17.0)
MCH: 31.6 pg (ref 26.0–34.0)
MCHC: 34 g/dL (ref 30.0–36.0)
MCV: 93 fL (ref 80.0–100.0)
Platelet Count: 254 10*3/uL (ref 150–400)
RBC: 3.86 MIL/uL — ABNORMAL LOW (ref 4.22–5.81)
RDW: 13.8 % (ref 11.5–15.5)
WBC Count: 4.8 10*3/uL (ref 4.0–10.5)
nRBC: 0 % (ref 0.0–0.2)

## 2022-11-03 LAB — RAD ONC ARIA SESSION SUMMARY
Course Elapsed Days: 16
Plan Fractions Treated to Date: 13
Plan Prescribed Dose Per Fraction: 2.075 Gy
Plan Total Fractions Prescribed: 40
Plan Total Prescribed Dose: 83 Gy
Reference Point Dosage Given to Date: 26.975 Gy
Reference Point Session Dosage Given: 2.075 Gy
Session Number: 13

## 2022-11-04 ENCOUNTER — Ambulatory Visit
Admission: RE | Admit: 2022-11-04 | Discharge: 2022-11-04 | Disposition: A | Discharge status: Home / Self Care | Payer: Medicare Other | Source: Ambulatory Visit | Attending: Radiation Oncology | Admitting: Radiation Oncology

## 2022-11-04 ENCOUNTER — Other Ambulatory Visit: Payer: Self-pay

## 2022-11-04 DIAGNOSIS — I10 Essential (primary) hypertension: Secondary | ICD-10-CM | POA: Diagnosis not present

## 2022-11-04 DIAGNOSIS — Z85118 Personal history of other malignant neoplasm of bronchus and lung: Secondary | ICD-10-CM | POA: Diagnosis not present

## 2022-11-04 DIAGNOSIS — R350 Frequency of micturition: Secondary | ICD-10-CM | POA: Diagnosis not present

## 2022-11-04 DIAGNOSIS — E785 Hyperlipidemia, unspecified: Secondary | ICD-10-CM | POA: Diagnosis not present

## 2022-11-04 DIAGNOSIS — Z510 Encounter for antineoplastic radiation therapy: Secondary | ICD-10-CM | POA: Diagnosis not present

## 2022-11-04 DIAGNOSIS — R351 Nocturia: Secondary | ICD-10-CM | POA: Diagnosis not present

## 2022-11-04 DIAGNOSIS — Z191 Hormone sensitive malignancy status: Secondary | ICD-10-CM | POA: Diagnosis not present

## 2022-11-04 DIAGNOSIS — C61 Malignant neoplasm of prostate: Secondary | ICD-10-CM | POA: Diagnosis not present

## 2022-11-04 LAB — RAD ONC ARIA SESSION SUMMARY
Course Elapsed Days: 17
Plan Fractions Treated to Date: 14
Plan Prescribed Dose Per Fraction: 2.075 Gy
Plan Total Fractions Prescribed: 40
Plan Total Prescribed Dose: 83 Gy
Reference Point Dosage Given to Date: 29.05 Gy
Reference Point Session Dosage Given: 2.075 Gy
Session Number: 14

## 2022-11-05 ENCOUNTER — Ambulatory Visit
Admission: RE | Admit: 2022-11-05 | Discharge: 2022-11-05 | Disposition: A | Discharge status: Home / Self Care | Payer: Medicare Other | Source: Ambulatory Visit | Attending: Radiation Oncology | Admitting: Radiation Oncology

## 2022-11-05 ENCOUNTER — Other Ambulatory Visit: Payer: Self-pay

## 2022-11-05 DIAGNOSIS — C61 Malignant neoplasm of prostate: Secondary | ICD-10-CM | POA: Diagnosis not present

## 2022-11-05 DIAGNOSIS — Z85118 Personal history of other malignant neoplasm of bronchus and lung: Secondary | ICD-10-CM | POA: Diagnosis not present

## 2022-11-05 DIAGNOSIS — I10 Essential (primary) hypertension: Secondary | ICD-10-CM | POA: Diagnosis not present

## 2022-11-05 DIAGNOSIS — Z191 Hormone sensitive malignancy status: Secondary | ICD-10-CM | POA: Diagnosis not present

## 2022-11-05 DIAGNOSIS — R350 Frequency of micturition: Secondary | ICD-10-CM | POA: Diagnosis not present

## 2022-11-05 DIAGNOSIS — E785 Hyperlipidemia, unspecified: Secondary | ICD-10-CM | POA: Diagnosis not present

## 2022-11-05 DIAGNOSIS — R351 Nocturia: Secondary | ICD-10-CM | POA: Diagnosis not present

## 2022-11-05 DIAGNOSIS — Z510 Encounter for antineoplastic radiation therapy: Secondary | ICD-10-CM | POA: Diagnosis not present

## 2022-11-05 LAB — RAD ONC ARIA SESSION SUMMARY
Course Elapsed Days: 18
Plan Fractions Treated to Date: 15
Plan Prescribed Dose Per Fraction: 2.075 Gy
Plan Total Fractions Prescribed: 40
Plan Total Prescribed Dose: 83 Gy
Reference Point Dosage Given to Date: 31.125 Gy
Reference Point Session Dosage Given: 2.075 Gy
Session Number: 15

## 2022-11-08 ENCOUNTER — Other Ambulatory Visit: Payer: Self-pay

## 2022-11-08 ENCOUNTER — Ambulatory Visit
Admission: RE | Admit: 2022-11-08 | Discharge: 2022-11-08 | Disposition: A | Discharge status: Home / Self Care | Payer: Medicare Other | Source: Ambulatory Visit | Attending: Radiation Oncology | Admitting: Radiation Oncology

## 2022-11-08 DIAGNOSIS — C61 Malignant neoplasm of prostate: Secondary | ICD-10-CM | POA: Diagnosis not present

## 2022-11-08 DIAGNOSIS — E785 Hyperlipidemia, unspecified: Secondary | ICD-10-CM | POA: Diagnosis not present

## 2022-11-08 DIAGNOSIS — Z510 Encounter for antineoplastic radiation therapy: Secondary | ICD-10-CM | POA: Diagnosis not present

## 2022-11-08 DIAGNOSIS — I10 Essential (primary) hypertension: Secondary | ICD-10-CM | POA: Diagnosis not present

## 2022-11-08 DIAGNOSIS — R350 Frequency of micturition: Secondary | ICD-10-CM | POA: Diagnosis not present

## 2022-11-08 DIAGNOSIS — Z191 Hormone sensitive malignancy status: Secondary | ICD-10-CM | POA: Diagnosis not present

## 2022-11-08 DIAGNOSIS — Z85118 Personal history of other malignant neoplasm of bronchus and lung: Secondary | ICD-10-CM | POA: Diagnosis not present

## 2022-11-08 DIAGNOSIS — R351 Nocturia: Secondary | ICD-10-CM | POA: Diagnosis not present

## 2022-11-08 LAB — RAD ONC ARIA SESSION SUMMARY
Course Elapsed Days: 21
Plan Fractions Treated to Date: 16
Plan Prescribed Dose Per Fraction: 2.075 Gy
Plan Total Fractions Prescribed: 40
Plan Total Prescribed Dose: 83 Gy
Reference Point Dosage Given to Date: 33.2 Gy
Reference Point Session Dosage Given: 2.075 Gy
Session Number: 16

## 2022-11-09 ENCOUNTER — Other Ambulatory Visit: Payer: Self-pay

## 2022-11-09 ENCOUNTER — Ambulatory Visit
Admission: RE | Admit: 2022-11-09 | Discharge: 2022-11-09 | Disposition: A | Discharge status: Home / Self Care | Payer: Medicare Other | Source: Ambulatory Visit | Attending: Radiation Oncology | Admitting: Radiation Oncology

## 2022-11-09 DIAGNOSIS — Z510 Encounter for antineoplastic radiation therapy: Secondary | ICD-10-CM | POA: Diagnosis not present

## 2022-11-09 DIAGNOSIS — E785 Hyperlipidemia, unspecified: Secondary | ICD-10-CM | POA: Diagnosis not present

## 2022-11-09 DIAGNOSIS — I10 Essential (primary) hypertension: Secondary | ICD-10-CM | POA: Diagnosis not present

## 2022-11-09 DIAGNOSIS — C61 Malignant neoplasm of prostate: Secondary | ICD-10-CM | POA: Diagnosis not present

## 2022-11-09 DIAGNOSIS — Z85118 Personal history of other malignant neoplasm of bronchus and lung: Secondary | ICD-10-CM | POA: Diagnosis not present

## 2022-11-09 DIAGNOSIS — R351 Nocturia: Secondary | ICD-10-CM | POA: Diagnosis not present

## 2022-11-09 DIAGNOSIS — Z191 Hormone sensitive malignancy status: Secondary | ICD-10-CM | POA: Diagnosis not present

## 2022-11-09 DIAGNOSIS — R350 Frequency of micturition: Secondary | ICD-10-CM | POA: Diagnosis not present

## 2022-11-09 LAB — RAD ONC ARIA SESSION SUMMARY
Course Elapsed Days: 22
Plan Fractions Treated to Date: 17
Plan Prescribed Dose Per Fraction: 2.075 Gy
Plan Total Fractions Prescribed: 40
Plan Total Prescribed Dose: 83 Gy
Reference Point Dosage Given to Date: 35.275 Gy
Reference Point Session Dosage Given: 2.075 Gy
Session Number: 17

## 2022-11-10 ENCOUNTER — Other Ambulatory Visit: Payer: Self-pay

## 2022-11-10 ENCOUNTER — Ambulatory Visit
Admission: RE | Admit: 2022-11-10 | Discharge: 2022-11-10 | Disposition: A | Discharge status: Home / Self Care | Payer: Medicare Other | Source: Ambulatory Visit | Attending: Radiation Oncology | Admitting: Radiation Oncology

## 2022-11-10 DIAGNOSIS — I10 Essential (primary) hypertension: Secondary | ICD-10-CM | POA: Diagnosis not present

## 2022-11-10 DIAGNOSIS — Z85118 Personal history of other malignant neoplasm of bronchus and lung: Secondary | ICD-10-CM | POA: Diagnosis not present

## 2022-11-10 DIAGNOSIS — R350 Frequency of micturition: Secondary | ICD-10-CM | POA: Diagnosis not present

## 2022-11-10 DIAGNOSIS — C61 Malignant neoplasm of prostate: Secondary | ICD-10-CM | POA: Diagnosis not present

## 2022-11-10 DIAGNOSIS — E785 Hyperlipidemia, unspecified: Secondary | ICD-10-CM | POA: Diagnosis not present

## 2022-11-10 DIAGNOSIS — Z191 Hormone sensitive malignancy status: Secondary | ICD-10-CM | POA: Diagnosis not present

## 2022-11-10 DIAGNOSIS — Z510 Encounter for antineoplastic radiation therapy: Secondary | ICD-10-CM | POA: Diagnosis not present

## 2022-11-10 DIAGNOSIS — R351 Nocturia: Secondary | ICD-10-CM | POA: Diagnosis not present

## 2022-11-10 LAB — RAD ONC ARIA SESSION SUMMARY
Course Elapsed Days: 23
Plan Fractions Treated to Date: 18
Plan Prescribed Dose Per Fraction: 2.075 Gy
Plan Total Fractions Prescribed: 40
Plan Total Prescribed Dose: 83 Gy
Reference Point Dosage Given to Date: 37.35 Gy
Reference Point Session Dosage Given: 2.075 Gy
Session Number: 18

## 2022-11-11 ENCOUNTER — Other Ambulatory Visit: Payer: Self-pay

## 2022-11-11 ENCOUNTER — Ambulatory Visit
Admission: RE | Admit: 2022-11-11 | Discharge: 2022-11-11 | Disposition: A | Discharge status: Home / Self Care | Payer: Medicare Other | Source: Ambulatory Visit | Attending: Radiation Oncology | Admitting: Radiation Oncology

## 2022-11-11 DIAGNOSIS — C61 Malignant neoplasm of prostate: Secondary | ICD-10-CM | POA: Diagnosis not present

## 2022-11-11 DIAGNOSIS — R351 Nocturia: Secondary | ICD-10-CM | POA: Diagnosis not present

## 2022-11-11 DIAGNOSIS — Z191 Hormone sensitive malignancy status: Secondary | ICD-10-CM | POA: Diagnosis not present

## 2022-11-11 DIAGNOSIS — R350 Frequency of micturition: Secondary | ICD-10-CM | POA: Diagnosis not present

## 2022-11-11 DIAGNOSIS — I10 Essential (primary) hypertension: Secondary | ICD-10-CM | POA: Diagnosis not present

## 2022-11-11 DIAGNOSIS — E785 Hyperlipidemia, unspecified: Secondary | ICD-10-CM | POA: Diagnosis not present

## 2022-11-11 DIAGNOSIS — Z510 Encounter for antineoplastic radiation therapy: Secondary | ICD-10-CM | POA: Diagnosis not present

## 2022-11-11 DIAGNOSIS — Z85118 Personal history of other malignant neoplasm of bronchus and lung: Secondary | ICD-10-CM | POA: Diagnosis not present

## 2022-11-11 LAB — RAD ONC ARIA SESSION SUMMARY
Course Elapsed Days: 24
Plan Fractions Treated to Date: 19
Plan Prescribed Dose Per Fraction: 2.075 Gy
Plan Total Fractions Prescribed: 40
Plan Total Prescribed Dose: 83 Gy
Reference Point Dosage Given to Date: 39.425 Gy
Reference Point Session Dosage Given: 2.075 Gy
Session Number: 19

## 2022-11-12 ENCOUNTER — Other Ambulatory Visit: Payer: Self-pay

## 2022-11-12 ENCOUNTER — Ambulatory Visit
Admission: RE | Admit: 2022-11-12 | Discharge: 2022-11-12 | Disposition: A | Discharge status: Home / Self Care | Payer: Medicare Other | Source: Ambulatory Visit | Attending: Radiation Oncology | Admitting: Radiation Oncology

## 2022-11-12 DIAGNOSIS — C61 Malignant neoplasm of prostate: Secondary | ICD-10-CM | POA: Diagnosis not present

## 2022-11-12 DIAGNOSIS — Z7982 Long term (current) use of aspirin: Secondary | ICD-10-CM | POA: Diagnosis not present

## 2022-11-12 DIAGNOSIS — Z85118 Personal history of other malignant neoplasm of bronchus and lung: Secondary | ICD-10-CM | POA: Insufficient documentation

## 2022-11-12 DIAGNOSIS — Z85828 Personal history of other malignant neoplasm of skin: Secondary | ICD-10-CM | POA: Diagnosis not present

## 2022-11-12 DIAGNOSIS — R350 Frequency of micturition: Secondary | ICD-10-CM | POA: Diagnosis not present

## 2022-11-12 DIAGNOSIS — D649 Anemia, unspecified: Secondary | ICD-10-CM | POA: Insufficient documentation

## 2022-11-12 DIAGNOSIS — R351 Nocturia: Secondary | ICD-10-CM | POA: Diagnosis not present

## 2022-11-12 DIAGNOSIS — E785 Hyperlipidemia, unspecified: Secondary | ICD-10-CM | POA: Diagnosis not present

## 2022-11-12 DIAGNOSIS — Z8042 Family history of malignant neoplasm of prostate: Secondary | ICD-10-CM | POA: Diagnosis not present

## 2022-11-12 DIAGNOSIS — Z87891 Personal history of nicotine dependence: Secondary | ICD-10-CM | POA: Diagnosis not present

## 2022-11-12 DIAGNOSIS — I10 Essential (primary) hypertension: Secondary | ICD-10-CM | POA: Diagnosis not present

## 2022-11-12 DIAGNOSIS — Z510 Encounter for antineoplastic radiation therapy: Secondary | ICD-10-CM | POA: Diagnosis not present

## 2022-11-12 DIAGNOSIS — Z79899 Other long term (current) drug therapy: Secondary | ICD-10-CM | POA: Insufficient documentation

## 2022-11-12 DIAGNOSIS — Z191 Hormone sensitive malignancy status: Secondary | ICD-10-CM | POA: Diagnosis not present

## 2022-11-12 LAB — RAD ONC ARIA SESSION SUMMARY
Course Elapsed Days: 25
Plan Fractions Treated to Date: 20
Plan Prescribed Dose Per Fraction: 2.075 Gy
Plan Total Fractions Prescribed: 40
Plan Total Prescribed Dose: 83 Gy
Reference Point Dosage Given to Date: 41.5 Gy
Reference Point Session Dosage Given: 2.075 Gy
Session Number: 20

## 2022-11-15 ENCOUNTER — Ambulatory Visit
Admission: RE | Admit: 2022-11-15 | Discharge: 2022-11-15 | Disposition: A | Discharge status: Home / Self Care | Payer: Medicare Other | Source: Ambulatory Visit | Attending: Radiation Oncology | Admitting: Radiation Oncology

## 2022-11-15 ENCOUNTER — Other Ambulatory Visit: Payer: Self-pay

## 2022-11-15 DIAGNOSIS — R351 Nocturia: Secondary | ICD-10-CM | POA: Diagnosis not present

## 2022-11-15 DIAGNOSIS — R350 Frequency of micturition: Secondary | ICD-10-CM | POA: Diagnosis not present

## 2022-11-15 DIAGNOSIS — I10 Essential (primary) hypertension: Secondary | ICD-10-CM | POA: Diagnosis not present

## 2022-11-15 DIAGNOSIS — E785 Hyperlipidemia, unspecified: Secondary | ICD-10-CM | POA: Diagnosis not present

## 2022-11-15 DIAGNOSIS — C61 Malignant neoplasm of prostate: Secondary | ICD-10-CM | POA: Diagnosis not present

## 2022-11-15 DIAGNOSIS — Z191 Hormone sensitive malignancy status: Secondary | ICD-10-CM | POA: Diagnosis not present

## 2022-11-15 DIAGNOSIS — Z510 Encounter for antineoplastic radiation therapy: Secondary | ICD-10-CM | POA: Diagnosis not present

## 2022-11-15 LAB — RAD ONC ARIA SESSION SUMMARY
Course Elapsed Days: 28
Plan Fractions Treated to Date: 21
Plan Prescribed Dose Per Fraction: 2.075 Gy
Plan Total Fractions Prescribed: 40
Plan Total Prescribed Dose: 83 Gy
Reference Point Dosage Given to Date: 43.575 Gy
Reference Point Session Dosage Given: 2.075 Gy
Session Number: 21

## 2022-11-16 ENCOUNTER — Other Ambulatory Visit: Payer: Self-pay

## 2022-11-16 ENCOUNTER — Telehealth: Payer: Self-pay | Admitting: Urology

## 2022-11-16 ENCOUNTER — Ambulatory Visit
Admission: RE | Admit: 2022-11-16 | Discharge: 2022-11-16 | Disposition: A | Discharge status: Home / Self Care | Payer: Medicare Other | Source: Ambulatory Visit | Attending: Radiation Oncology | Admitting: Radiation Oncology

## 2022-11-16 DIAGNOSIS — R350 Frequency of micturition: Secondary | ICD-10-CM | POA: Diagnosis not present

## 2022-11-16 DIAGNOSIS — E785 Hyperlipidemia, unspecified: Secondary | ICD-10-CM | POA: Diagnosis not present

## 2022-11-16 DIAGNOSIS — C61 Malignant neoplasm of prostate: Secondary | ICD-10-CM | POA: Diagnosis not present

## 2022-11-16 DIAGNOSIS — R351 Nocturia: Secondary | ICD-10-CM | POA: Diagnosis not present

## 2022-11-16 DIAGNOSIS — Z510 Encounter for antineoplastic radiation therapy: Secondary | ICD-10-CM | POA: Diagnosis not present

## 2022-11-16 DIAGNOSIS — I10 Essential (primary) hypertension: Secondary | ICD-10-CM | POA: Diagnosis not present

## 2022-11-16 DIAGNOSIS — Z191 Hormone sensitive malignancy status: Secondary | ICD-10-CM | POA: Diagnosis not present

## 2022-11-16 LAB — RAD ONC ARIA SESSION SUMMARY
Course Elapsed Days: 29
Plan Fractions Treated to Date: 22
Plan Prescribed Dose Per Fraction: 2.075 Gy
Plan Total Fractions Prescribed: 40
Plan Total Prescribed Dose: 83 Gy
Reference Point Dosage Given to Date: 45.65 Gy
Reference Point Session Dosage Given: 2.075 Gy
Session Number: 22

## 2022-11-16 NOTE — Telephone Encounter (Signed)
Patient called regarding his lab appt on 11/30/22 and appt with Dr. Lonna Cobb on 12/03/22. He said he is only halfway through his radiation treatment, and asked if these appointments should be rescheduled for sometime in December. He thinks it may be too early. Please advise patient.

## 2022-11-17 ENCOUNTER — Other Ambulatory Visit: Payer: Self-pay

## 2022-11-17 ENCOUNTER — Inpatient Hospital Stay: Payer: Medicare Other

## 2022-11-17 ENCOUNTER — Ambulatory Visit
Admission: RE | Admit: 2022-11-17 | Discharge: 2022-11-17 | Disposition: A | Discharge status: Home / Self Care | Payer: Medicare Other | Source: Ambulatory Visit | Attending: Radiation Oncology | Admitting: Radiation Oncology

## 2022-11-17 DIAGNOSIS — D649 Anemia, unspecified: Secondary | ICD-10-CM | POA: Insufficient documentation

## 2022-11-17 DIAGNOSIS — R350 Frequency of micturition: Secondary | ICD-10-CM | POA: Diagnosis not present

## 2022-11-17 DIAGNOSIS — R351 Nocturia: Secondary | ICD-10-CM | POA: Diagnosis not present

## 2022-11-17 DIAGNOSIS — C61 Malignant neoplasm of prostate: Secondary | ICD-10-CM | POA: Diagnosis not present

## 2022-11-17 DIAGNOSIS — Z191 Hormone sensitive malignancy status: Secondary | ICD-10-CM | POA: Diagnosis not present

## 2022-11-17 DIAGNOSIS — I10 Essential (primary) hypertension: Secondary | ICD-10-CM | POA: Diagnosis not present

## 2022-11-17 DIAGNOSIS — E785 Hyperlipidemia, unspecified: Secondary | ICD-10-CM | POA: Diagnosis not present

## 2022-11-17 DIAGNOSIS — Z510 Encounter for antineoplastic radiation therapy: Secondary | ICD-10-CM | POA: Diagnosis not present

## 2022-11-17 LAB — RAD ONC ARIA SESSION SUMMARY
Course Elapsed Days: 30
Plan Fractions Treated to Date: 23
Plan Prescribed Dose Per Fraction: 2.075 Gy
Plan Total Fractions Prescribed: 40
Plan Total Prescribed Dose: 83 Gy
Reference Point Dosage Given to Date: 47.725 Gy
Reference Point Session Dosage Given: 2.075 Gy
Session Number: 23

## 2022-11-18 ENCOUNTER — Ambulatory Visit
Admission: RE | Admit: 2022-11-18 | Discharge: 2022-11-18 | Disposition: A | Discharge status: Home / Self Care | Payer: Medicare Other | Source: Ambulatory Visit | Attending: Radiation Oncology | Admitting: Radiation Oncology

## 2022-11-18 ENCOUNTER — Other Ambulatory Visit: Payer: Self-pay

## 2022-11-18 DIAGNOSIS — E785 Hyperlipidemia, unspecified: Secondary | ICD-10-CM | POA: Diagnosis not present

## 2022-11-18 DIAGNOSIS — Z510 Encounter for antineoplastic radiation therapy: Secondary | ICD-10-CM | POA: Diagnosis not present

## 2022-11-18 DIAGNOSIS — R351 Nocturia: Secondary | ICD-10-CM | POA: Diagnosis not present

## 2022-11-18 DIAGNOSIS — Z191 Hormone sensitive malignancy status: Secondary | ICD-10-CM | POA: Diagnosis not present

## 2022-11-18 DIAGNOSIS — C61 Malignant neoplasm of prostate: Secondary | ICD-10-CM | POA: Diagnosis not present

## 2022-11-18 DIAGNOSIS — I10 Essential (primary) hypertension: Secondary | ICD-10-CM | POA: Diagnosis not present

## 2022-11-18 DIAGNOSIS — R350 Frequency of micturition: Secondary | ICD-10-CM | POA: Diagnosis not present

## 2022-11-18 LAB — RAD ONC ARIA SESSION SUMMARY
Course Elapsed Days: 31
Plan Fractions Treated to Date: 24
Plan Prescribed Dose Per Fraction: 2.075 Gy
Plan Total Fractions Prescribed: 40
Plan Total Prescribed Dose: 83 Gy
Reference Point Dosage Given to Date: 49.8 Gy
Reference Point Session Dosage Given: 2.075 Gy
Session Number: 24

## 2022-11-19 ENCOUNTER — Other Ambulatory Visit: Payer: Self-pay

## 2022-11-19 ENCOUNTER — Ambulatory Visit
Admission: RE | Admit: 2022-11-19 | Discharge: 2022-11-19 | Disposition: A | Discharge status: Home / Self Care | Payer: Medicare Other | Source: Ambulatory Visit | Attending: Radiation Oncology | Admitting: Radiation Oncology

## 2022-11-19 DIAGNOSIS — Z191 Hormone sensitive malignancy status: Secondary | ICD-10-CM | POA: Diagnosis not present

## 2022-11-19 DIAGNOSIS — E785 Hyperlipidemia, unspecified: Secondary | ICD-10-CM | POA: Diagnosis not present

## 2022-11-19 DIAGNOSIS — R350 Frequency of micturition: Secondary | ICD-10-CM | POA: Diagnosis not present

## 2022-11-19 DIAGNOSIS — I10 Essential (primary) hypertension: Secondary | ICD-10-CM | POA: Diagnosis not present

## 2022-11-19 DIAGNOSIS — Z510 Encounter for antineoplastic radiation therapy: Secondary | ICD-10-CM | POA: Diagnosis not present

## 2022-11-19 DIAGNOSIS — C61 Malignant neoplasm of prostate: Secondary | ICD-10-CM | POA: Diagnosis not present

## 2022-11-19 DIAGNOSIS — R351 Nocturia: Secondary | ICD-10-CM | POA: Diagnosis not present

## 2022-11-19 LAB — RAD ONC ARIA SESSION SUMMARY
Course Elapsed Days: 32
Plan Fractions Treated to Date: 25
Plan Prescribed Dose Per Fraction: 2.075 Gy
Plan Total Fractions Prescribed: 40
Plan Total Prescribed Dose: 83 Gy
Reference Point Dosage Given to Date: 51.875 Gy
Reference Point Session Dosage Given: 2.075 Gy
Session Number: 25

## 2022-11-22 ENCOUNTER — Ambulatory Visit
Admission: RE | Admit: 2022-11-22 | Discharge: 2022-11-22 | Disposition: A | Discharge status: Home / Self Care | Payer: Medicare Other | Source: Ambulatory Visit | Attending: Radiation Oncology | Admitting: Radiation Oncology

## 2022-11-22 ENCOUNTER — Other Ambulatory Visit: Payer: Self-pay

## 2022-11-22 DIAGNOSIS — C61 Malignant neoplasm of prostate: Secondary | ICD-10-CM | POA: Diagnosis not present

## 2022-11-22 DIAGNOSIS — I10 Essential (primary) hypertension: Secondary | ICD-10-CM | POA: Diagnosis not present

## 2022-11-22 DIAGNOSIS — Z191 Hormone sensitive malignancy status: Secondary | ICD-10-CM | POA: Diagnosis not present

## 2022-11-22 DIAGNOSIS — R351 Nocturia: Secondary | ICD-10-CM | POA: Diagnosis not present

## 2022-11-22 DIAGNOSIS — Z510 Encounter for antineoplastic radiation therapy: Secondary | ICD-10-CM | POA: Diagnosis not present

## 2022-11-22 DIAGNOSIS — R350 Frequency of micturition: Secondary | ICD-10-CM | POA: Diagnosis not present

## 2022-11-22 DIAGNOSIS — E785 Hyperlipidemia, unspecified: Secondary | ICD-10-CM | POA: Diagnosis not present

## 2022-11-22 LAB — RAD ONC ARIA SESSION SUMMARY
Course Elapsed Days: 35
Plan Fractions Treated to Date: 26
Plan Prescribed Dose Per Fraction: 2.075 Gy
Plan Total Fractions Prescribed: 40
Plan Total Prescribed Dose: 83 Gy
Reference Point Dosage Given to Date: 53.95 Gy
Reference Point Session Dosage Given: 2.075 Gy
Session Number: 26

## 2022-11-23 ENCOUNTER — Ambulatory Visit
Admission: RE | Admit: 2022-11-23 | Discharge: 2022-11-23 | Disposition: A | Discharge status: Home / Self Care | Payer: Medicare Other | Source: Ambulatory Visit | Attending: Radiation Oncology | Admitting: Radiation Oncology

## 2022-11-23 ENCOUNTER — Other Ambulatory Visit: Payer: Self-pay

## 2022-11-23 DIAGNOSIS — Z191 Hormone sensitive malignancy status: Secondary | ICD-10-CM | POA: Diagnosis not present

## 2022-11-23 DIAGNOSIS — R351 Nocturia: Secondary | ICD-10-CM | POA: Diagnosis not present

## 2022-11-23 DIAGNOSIS — C61 Malignant neoplasm of prostate: Secondary | ICD-10-CM | POA: Diagnosis not present

## 2022-11-23 DIAGNOSIS — R350 Frequency of micturition: Secondary | ICD-10-CM | POA: Diagnosis not present

## 2022-11-23 DIAGNOSIS — I10 Essential (primary) hypertension: Secondary | ICD-10-CM | POA: Diagnosis not present

## 2022-11-23 DIAGNOSIS — E785 Hyperlipidemia, unspecified: Secondary | ICD-10-CM | POA: Diagnosis not present

## 2022-11-23 DIAGNOSIS — Z510 Encounter for antineoplastic radiation therapy: Secondary | ICD-10-CM | POA: Diagnosis not present

## 2022-11-23 LAB — RAD ONC ARIA SESSION SUMMARY
Course Elapsed Days: 36
Plan Fractions Treated to Date: 27
Plan Prescribed Dose Per Fraction: 2.075 Gy
Plan Total Fractions Prescribed: 40
Plan Total Prescribed Dose: 83 Gy
Reference Point Dosage Given to Date: 56.025 Gy
Reference Point Session Dosage Given: 2.075 Gy
Session Number: 27

## 2022-11-24 ENCOUNTER — Ambulatory Visit
Admission: RE | Admit: 2022-11-24 | Discharge: 2022-11-24 | Disposition: A | Discharge status: Home / Self Care | Payer: Medicare Other | Source: Ambulatory Visit | Attending: Radiation Oncology | Admitting: Radiation Oncology

## 2022-11-24 ENCOUNTER — Other Ambulatory Visit: Payer: Self-pay

## 2022-11-24 DIAGNOSIS — M9905 Segmental and somatic dysfunction of pelvic region: Secondary | ICD-10-CM | POA: Diagnosis not present

## 2022-11-24 DIAGNOSIS — R350 Frequency of micturition: Secondary | ICD-10-CM | POA: Diagnosis not present

## 2022-11-24 DIAGNOSIS — C61 Malignant neoplasm of prostate: Secondary | ICD-10-CM | POA: Diagnosis not present

## 2022-11-24 DIAGNOSIS — M545 Low back pain, unspecified: Secondary | ICD-10-CM | POA: Diagnosis not present

## 2022-11-24 DIAGNOSIS — Z191 Hormone sensitive malignancy status: Secondary | ICD-10-CM | POA: Diagnosis not present

## 2022-11-24 DIAGNOSIS — M4306 Spondylolysis, lumbar region: Secondary | ICD-10-CM | POA: Diagnosis not present

## 2022-11-24 DIAGNOSIS — M9903 Segmental and somatic dysfunction of lumbar region: Secondary | ICD-10-CM | POA: Diagnosis not present

## 2022-11-24 DIAGNOSIS — E785 Hyperlipidemia, unspecified: Secondary | ICD-10-CM | POA: Diagnosis not present

## 2022-11-24 DIAGNOSIS — Z510 Encounter for antineoplastic radiation therapy: Secondary | ICD-10-CM | POA: Diagnosis not present

## 2022-11-24 DIAGNOSIS — I10 Essential (primary) hypertension: Secondary | ICD-10-CM | POA: Diagnosis not present

## 2022-11-24 DIAGNOSIS — R351 Nocturia: Secondary | ICD-10-CM | POA: Diagnosis not present

## 2022-11-24 LAB — RAD ONC ARIA SESSION SUMMARY
Course Elapsed Days: 37
Plan Fractions Treated to Date: 28
Plan Prescribed Dose Per Fraction: 2.075 Gy
Plan Total Fractions Prescribed: 40
Plan Total Prescribed Dose: 83 Gy
Reference Point Dosage Given to Date: 58.1 Gy
Reference Point Session Dosage Given: 2.075 Gy
Session Number: 28

## 2022-11-25 ENCOUNTER — Other Ambulatory Visit: Payer: Self-pay

## 2022-11-25 ENCOUNTER — Ambulatory Visit
Admission: RE | Admit: 2022-11-25 | Discharge: 2022-11-25 | Disposition: A | Discharge status: Home / Self Care | Payer: Medicare Other | Source: Ambulatory Visit | Attending: Radiation Oncology | Admitting: Radiation Oncology

## 2022-11-25 DIAGNOSIS — R351 Nocturia: Secondary | ICD-10-CM | POA: Diagnosis not present

## 2022-11-25 DIAGNOSIS — E785 Hyperlipidemia, unspecified: Secondary | ICD-10-CM | POA: Diagnosis not present

## 2022-11-25 DIAGNOSIS — C61 Malignant neoplasm of prostate: Secondary | ICD-10-CM | POA: Diagnosis not present

## 2022-11-25 DIAGNOSIS — R350 Frequency of micturition: Secondary | ICD-10-CM | POA: Diagnosis not present

## 2022-11-25 DIAGNOSIS — Z510 Encounter for antineoplastic radiation therapy: Secondary | ICD-10-CM | POA: Diagnosis not present

## 2022-11-25 DIAGNOSIS — I10 Essential (primary) hypertension: Secondary | ICD-10-CM | POA: Diagnosis not present

## 2022-11-25 DIAGNOSIS — Z191 Hormone sensitive malignancy status: Secondary | ICD-10-CM | POA: Diagnosis not present

## 2022-11-25 LAB — RAD ONC ARIA SESSION SUMMARY
Course Elapsed Days: 38
Plan Fractions Treated to Date: 29
Plan Prescribed Dose Per Fraction: 2.075 Gy
Plan Total Fractions Prescribed: 40
Plan Total Prescribed Dose: 83 Gy
Reference Point Dosage Given to Date: 60.175 Gy
Reference Point Session Dosage Given: 2.075 Gy
Session Number: 29

## 2022-11-26 ENCOUNTER — Other Ambulatory Visit: Payer: Self-pay

## 2022-11-26 ENCOUNTER — Ambulatory Visit
Admission: RE | Admit: 2022-11-26 | Discharge: 2022-11-26 | Disposition: A | Discharge status: Home / Self Care | Payer: Medicare Other | Source: Ambulatory Visit | Attending: Radiation Oncology | Admitting: Radiation Oncology

## 2022-11-26 DIAGNOSIS — R351 Nocturia: Secondary | ICD-10-CM | POA: Diagnosis not present

## 2022-11-26 DIAGNOSIS — C61 Malignant neoplasm of prostate: Secondary | ICD-10-CM | POA: Diagnosis not present

## 2022-11-26 DIAGNOSIS — Z510 Encounter for antineoplastic radiation therapy: Secondary | ICD-10-CM | POA: Diagnosis not present

## 2022-11-26 DIAGNOSIS — E785 Hyperlipidemia, unspecified: Secondary | ICD-10-CM | POA: Diagnosis not present

## 2022-11-26 DIAGNOSIS — I10 Essential (primary) hypertension: Secondary | ICD-10-CM | POA: Diagnosis not present

## 2022-11-26 DIAGNOSIS — R350 Frequency of micturition: Secondary | ICD-10-CM | POA: Diagnosis not present

## 2022-11-26 DIAGNOSIS — Z191 Hormone sensitive malignancy status: Secondary | ICD-10-CM | POA: Diagnosis not present

## 2022-11-26 LAB — RAD ONC ARIA SESSION SUMMARY
Course Elapsed Days: 39
Plan Fractions Treated to Date: 30
Plan Prescribed Dose Per Fraction: 2.075 Gy
Plan Total Fractions Prescribed: 40
Plan Total Prescribed Dose: 83 Gy
Reference Point Dosage Given to Date: 62.25 Gy
Reference Point Session Dosage Given: 2.075 Gy
Session Number: 30

## 2022-11-29 ENCOUNTER — Other Ambulatory Visit: Payer: Self-pay

## 2022-11-29 ENCOUNTER — Ambulatory Visit
Admission: RE | Admit: 2022-11-29 | Discharge: 2022-11-29 | Disposition: A | Discharge status: Home / Self Care | Payer: Medicare Other | Source: Ambulatory Visit | Attending: Radiation Oncology | Admitting: Radiation Oncology

## 2022-11-29 DIAGNOSIS — E785 Hyperlipidemia, unspecified: Secondary | ICD-10-CM | POA: Diagnosis not present

## 2022-11-29 DIAGNOSIS — R351 Nocturia: Secondary | ICD-10-CM | POA: Diagnosis not present

## 2022-11-29 DIAGNOSIS — Z510 Encounter for antineoplastic radiation therapy: Secondary | ICD-10-CM | POA: Diagnosis not present

## 2022-11-29 DIAGNOSIS — C61 Malignant neoplasm of prostate: Secondary | ICD-10-CM | POA: Diagnosis not present

## 2022-11-29 DIAGNOSIS — I10 Essential (primary) hypertension: Secondary | ICD-10-CM | POA: Diagnosis not present

## 2022-11-29 DIAGNOSIS — Z191 Hormone sensitive malignancy status: Secondary | ICD-10-CM | POA: Diagnosis not present

## 2022-11-29 DIAGNOSIS — R350 Frequency of micturition: Secondary | ICD-10-CM | POA: Diagnosis not present

## 2022-11-29 LAB — RAD ONC ARIA SESSION SUMMARY
Course Elapsed Days: 42
Plan Fractions Treated to Date: 31
Plan Prescribed Dose Per Fraction: 2.075 Gy
Plan Total Fractions Prescribed: 40
Plan Total Prescribed Dose: 83 Gy
Reference Point Dosage Given to Date: 64.325 Gy
Reference Point Session Dosage Given: 2.075 Gy
Session Number: 31

## 2022-11-30 ENCOUNTER — Other Ambulatory Visit: Payer: Medicare Other

## 2022-11-30 ENCOUNTER — Ambulatory Visit
Admission: RE | Admit: 2022-11-30 | Discharge: 2022-11-30 | Disposition: A | Discharge status: Home / Self Care | Payer: Medicare Other | Source: Ambulatory Visit | Attending: Radiation Oncology | Admitting: Radiation Oncology

## 2022-11-30 ENCOUNTER — Other Ambulatory Visit: Payer: Self-pay

## 2022-11-30 DIAGNOSIS — I10 Essential (primary) hypertension: Secondary | ICD-10-CM | POA: Diagnosis not present

## 2022-11-30 DIAGNOSIS — R350 Frequency of micturition: Secondary | ICD-10-CM | POA: Diagnosis not present

## 2022-11-30 DIAGNOSIS — Z510 Encounter for antineoplastic radiation therapy: Secondary | ICD-10-CM | POA: Diagnosis not present

## 2022-11-30 DIAGNOSIS — C61 Malignant neoplasm of prostate: Secondary | ICD-10-CM | POA: Diagnosis not present

## 2022-11-30 DIAGNOSIS — Z191 Hormone sensitive malignancy status: Secondary | ICD-10-CM | POA: Diagnosis not present

## 2022-11-30 DIAGNOSIS — E785 Hyperlipidemia, unspecified: Secondary | ICD-10-CM | POA: Diagnosis not present

## 2022-11-30 DIAGNOSIS — R351 Nocturia: Secondary | ICD-10-CM | POA: Diagnosis not present

## 2022-11-30 LAB — RAD ONC ARIA SESSION SUMMARY
Course Elapsed Days: 43
Plan Fractions Treated to Date: 32
Plan Prescribed Dose Per Fraction: 2.075 Gy
Plan Total Fractions Prescribed: 40
Plan Total Prescribed Dose: 83 Gy
Reference Point Dosage Given to Date: 66.4 Gy
Reference Point Session Dosage Given: 2.075 Gy
Session Number: 32

## 2022-12-01 ENCOUNTER — Ambulatory Visit
Admission: RE | Admit: 2022-12-01 | Discharge: 2022-12-01 | Disposition: A | Discharge status: Home / Self Care | Payer: Medicare Other | Source: Ambulatory Visit | Attending: Radiation Oncology | Admitting: Radiation Oncology

## 2022-12-01 ENCOUNTER — Inpatient Hospital Stay: Payer: Medicare Other

## 2022-12-01 ENCOUNTER — Other Ambulatory Visit: Payer: Self-pay

## 2022-12-01 DIAGNOSIS — C61 Malignant neoplasm of prostate: Secondary | ICD-10-CM | POA: Diagnosis not present

## 2022-12-01 DIAGNOSIS — R351 Nocturia: Secondary | ICD-10-CM | POA: Diagnosis not present

## 2022-12-01 DIAGNOSIS — Z191 Hormone sensitive malignancy status: Secondary | ICD-10-CM | POA: Diagnosis not present

## 2022-12-01 DIAGNOSIS — E785 Hyperlipidemia, unspecified: Secondary | ICD-10-CM | POA: Diagnosis not present

## 2022-12-01 DIAGNOSIS — I10 Essential (primary) hypertension: Secondary | ICD-10-CM | POA: Diagnosis not present

## 2022-12-01 DIAGNOSIS — Z510 Encounter for antineoplastic radiation therapy: Secondary | ICD-10-CM | POA: Diagnosis not present

## 2022-12-01 DIAGNOSIS — R350 Frequency of micturition: Secondary | ICD-10-CM | POA: Diagnosis not present

## 2022-12-01 LAB — RAD ONC ARIA SESSION SUMMARY
Course Elapsed Days: 44
Plan Fractions Treated to Date: 33
Plan Prescribed Dose Per Fraction: 2.075 Gy
Plan Total Fractions Prescribed: 40
Plan Total Prescribed Dose: 83 Gy
Reference Point Dosage Given to Date: 68.475 Gy
Reference Point Session Dosage Given: 2.075 Gy
Session Number: 33

## 2022-12-01 LAB — CBC (CANCER CENTER ONLY)
HCT: 38.1 % — ABNORMAL LOW (ref 39.0–52.0)
Hemoglobin: 12.5 g/dL — ABNORMAL LOW (ref 13.0–17.0)
MCH: 31.6 pg (ref 26.0–34.0)
MCHC: 32.8 g/dL (ref 30.0–36.0)
MCV: 96.2 fL (ref 80.0–100.0)
Platelet Count: 235 K/uL (ref 150–400)
RBC: 3.96 MIL/uL — ABNORMAL LOW (ref 4.22–5.81)
RDW: 14.4 % (ref 11.5–15.5)
WBC Count: 4.1 K/uL (ref 4.0–10.5)
nRBC: 0 % (ref 0.0–0.2)

## 2022-12-02 ENCOUNTER — Other Ambulatory Visit: Payer: Self-pay

## 2022-12-02 ENCOUNTER — Ambulatory Visit
Admission: RE | Admit: 2022-12-02 | Discharge: 2022-12-02 | Disposition: A | Discharge status: Home / Self Care | Payer: Medicare Other | Source: Ambulatory Visit | Attending: Radiation Oncology | Admitting: Radiation Oncology

## 2022-12-02 DIAGNOSIS — E785 Hyperlipidemia, unspecified: Secondary | ICD-10-CM | POA: Diagnosis not present

## 2022-12-02 DIAGNOSIS — R350 Frequency of micturition: Secondary | ICD-10-CM | POA: Diagnosis not present

## 2022-12-02 DIAGNOSIS — I10 Essential (primary) hypertension: Secondary | ICD-10-CM | POA: Diagnosis not present

## 2022-12-02 DIAGNOSIS — C61 Malignant neoplasm of prostate: Secondary | ICD-10-CM | POA: Diagnosis not present

## 2022-12-02 DIAGNOSIS — Z191 Hormone sensitive malignancy status: Secondary | ICD-10-CM | POA: Diagnosis not present

## 2022-12-02 DIAGNOSIS — R351 Nocturia: Secondary | ICD-10-CM | POA: Diagnosis not present

## 2022-12-02 DIAGNOSIS — Z510 Encounter for antineoplastic radiation therapy: Secondary | ICD-10-CM | POA: Diagnosis not present

## 2022-12-02 LAB — RAD ONC ARIA SESSION SUMMARY
Course Elapsed Days: 45
Plan Fractions Treated to Date: 34
Plan Prescribed Dose Per Fraction: 2.075 Gy
Plan Total Fractions Prescribed: 40
Plan Total Prescribed Dose: 83 Gy
Reference Point Dosage Given to Date: 70.55 Gy
Reference Point Session Dosage Given: 2.075 Gy
Session Number: 34

## 2022-12-03 ENCOUNTER — Ambulatory Visit
Admission: RE | Admit: 2022-12-03 | Discharge: 2022-12-03 | Disposition: A | Discharge status: Home / Self Care | Payer: Medicare Other | Source: Ambulatory Visit | Attending: Radiation Oncology | Admitting: Radiation Oncology

## 2022-12-03 ENCOUNTER — Other Ambulatory Visit: Payer: Self-pay

## 2022-12-03 ENCOUNTER — Ambulatory Visit: Payer: Medicare Other | Admitting: Urology

## 2022-12-03 DIAGNOSIS — C61 Malignant neoplasm of prostate: Secondary | ICD-10-CM | POA: Diagnosis not present

## 2022-12-03 DIAGNOSIS — E785 Hyperlipidemia, unspecified: Secondary | ICD-10-CM | POA: Diagnosis not present

## 2022-12-03 DIAGNOSIS — Z510 Encounter for antineoplastic radiation therapy: Secondary | ICD-10-CM | POA: Diagnosis not present

## 2022-12-03 DIAGNOSIS — Z191 Hormone sensitive malignancy status: Secondary | ICD-10-CM | POA: Diagnosis not present

## 2022-12-03 DIAGNOSIS — R350 Frequency of micturition: Secondary | ICD-10-CM | POA: Diagnosis not present

## 2022-12-03 DIAGNOSIS — R351 Nocturia: Secondary | ICD-10-CM | POA: Diagnosis not present

## 2022-12-03 DIAGNOSIS — I10 Essential (primary) hypertension: Secondary | ICD-10-CM | POA: Diagnosis not present

## 2022-12-03 LAB — RAD ONC ARIA SESSION SUMMARY
Course Elapsed Days: 46
Plan Fractions Treated to Date: 35
Plan Prescribed Dose Per Fraction: 2.075 Gy
Plan Total Fractions Prescribed: 40
Plan Total Prescribed Dose: 83 Gy
Reference Point Dosage Given to Date: 72.625 Gy
Reference Point Session Dosage Given: 2.075 Gy
Session Number: 35

## 2022-12-06 ENCOUNTER — Ambulatory Visit
Admission: RE | Admit: 2022-12-06 | Discharge: 2022-12-06 | Disposition: A | Discharge status: Home / Self Care | Payer: Medicare Other | Source: Ambulatory Visit | Attending: Radiation Oncology | Admitting: Radiation Oncology

## 2022-12-06 ENCOUNTER — Other Ambulatory Visit: Payer: Self-pay

## 2022-12-06 DIAGNOSIS — R351 Nocturia: Secondary | ICD-10-CM | POA: Diagnosis not present

## 2022-12-06 DIAGNOSIS — E785 Hyperlipidemia, unspecified: Secondary | ICD-10-CM | POA: Diagnosis not present

## 2022-12-06 DIAGNOSIS — R350 Frequency of micturition: Secondary | ICD-10-CM | POA: Diagnosis not present

## 2022-12-06 DIAGNOSIS — C61 Malignant neoplasm of prostate: Secondary | ICD-10-CM | POA: Diagnosis not present

## 2022-12-06 DIAGNOSIS — I10 Essential (primary) hypertension: Secondary | ICD-10-CM | POA: Diagnosis not present

## 2022-12-06 DIAGNOSIS — Z510 Encounter for antineoplastic radiation therapy: Secondary | ICD-10-CM | POA: Diagnosis not present

## 2022-12-06 DIAGNOSIS — Z191 Hormone sensitive malignancy status: Secondary | ICD-10-CM | POA: Diagnosis not present

## 2022-12-06 LAB — RAD ONC ARIA SESSION SUMMARY
Course Elapsed Days: 49
Plan Fractions Treated to Date: 36
Plan Prescribed Dose Per Fraction: 2.075 Gy
Plan Total Fractions Prescribed: 40
Plan Total Prescribed Dose: 83 Gy
Reference Point Dosage Given to Date: 74.7 Gy
Reference Point Session Dosage Given: 2.075 Gy
Session Number: 36

## 2022-12-07 ENCOUNTER — Other Ambulatory Visit: Payer: Self-pay

## 2022-12-07 ENCOUNTER — Ambulatory Visit
Admission: RE | Admit: 2022-12-07 | Discharge: 2022-12-07 | Disposition: A | Discharge status: Home / Self Care | Payer: Medicare Other | Source: Ambulatory Visit | Attending: Radiation Oncology | Admitting: Radiation Oncology

## 2022-12-07 DIAGNOSIS — E785 Hyperlipidemia, unspecified: Secondary | ICD-10-CM | POA: Diagnosis not present

## 2022-12-07 DIAGNOSIS — C61 Malignant neoplasm of prostate: Secondary | ICD-10-CM | POA: Diagnosis not present

## 2022-12-07 DIAGNOSIS — Z510 Encounter for antineoplastic radiation therapy: Secondary | ICD-10-CM | POA: Diagnosis not present

## 2022-12-07 DIAGNOSIS — I10 Essential (primary) hypertension: Secondary | ICD-10-CM | POA: Diagnosis not present

## 2022-12-07 DIAGNOSIS — Z191 Hormone sensitive malignancy status: Secondary | ICD-10-CM | POA: Diagnosis not present

## 2022-12-07 DIAGNOSIS — R350 Frequency of micturition: Secondary | ICD-10-CM | POA: Diagnosis not present

## 2022-12-07 DIAGNOSIS — R351 Nocturia: Secondary | ICD-10-CM | POA: Diagnosis not present

## 2022-12-07 LAB — RAD ONC ARIA SESSION SUMMARY
Course Elapsed Days: 50
Plan Fractions Treated to Date: 37
Plan Prescribed Dose Per Fraction: 2.075 Gy
Plan Total Fractions Prescribed: 40
Plan Total Prescribed Dose: 83 Gy
Reference Point Dosage Given to Date: 76.775 Gy
Reference Point Session Dosage Given: 2.075 Gy
Session Number: 37

## 2022-12-08 ENCOUNTER — Ambulatory Visit: Payer: Medicare Other

## 2022-12-08 ENCOUNTER — Other Ambulatory Visit: Payer: Self-pay

## 2022-12-08 ENCOUNTER — Ambulatory Visit
Admission: RE | Admit: 2022-12-08 | Discharge: 2022-12-08 | Disposition: A | Discharge status: Home / Self Care | Payer: Medicare Other | Source: Ambulatory Visit | Attending: Radiation Oncology | Admitting: Radiation Oncology

## 2022-12-08 DIAGNOSIS — Z191 Hormone sensitive malignancy status: Secondary | ICD-10-CM | POA: Diagnosis not present

## 2022-12-08 DIAGNOSIS — Z510 Encounter for antineoplastic radiation therapy: Secondary | ICD-10-CM | POA: Diagnosis not present

## 2022-12-08 DIAGNOSIS — E785 Hyperlipidemia, unspecified: Secondary | ICD-10-CM | POA: Diagnosis not present

## 2022-12-08 DIAGNOSIS — R351 Nocturia: Secondary | ICD-10-CM | POA: Diagnosis not present

## 2022-12-08 DIAGNOSIS — I10 Essential (primary) hypertension: Secondary | ICD-10-CM | POA: Diagnosis not present

## 2022-12-08 DIAGNOSIS — R350 Frequency of micturition: Secondary | ICD-10-CM | POA: Diagnosis not present

## 2022-12-08 DIAGNOSIS — C61 Malignant neoplasm of prostate: Secondary | ICD-10-CM | POA: Diagnosis not present

## 2022-12-08 LAB — RAD ONC ARIA SESSION SUMMARY
Course Elapsed Days: 51
Plan Fractions Treated to Date: 38
Plan Prescribed Dose Per Fraction: 2.075 Gy
Plan Total Fractions Prescribed: 40
Plan Total Prescribed Dose: 83 Gy
Reference Point Dosage Given to Date: 78.85 Gy
Reference Point Session Dosage Given: 2.075 Gy
Session Number: 38

## 2022-12-10 ENCOUNTER — Ambulatory Visit: Payer: Medicare Other

## 2022-12-13 ENCOUNTER — Other Ambulatory Visit: Payer: Self-pay

## 2022-12-13 ENCOUNTER — Ambulatory Visit
Admission: RE | Admit: 2022-12-13 | Discharge: 2022-12-13 | Disposition: A | Discharge status: Home / Self Care | Payer: Medicare Other | Source: Ambulatory Visit | Attending: Radiation Oncology | Admitting: Radiation Oncology

## 2022-12-13 ENCOUNTER — Ambulatory Visit: Payer: Medicare Other

## 2022-12-13 DIAGNOSIS — R350 Frequency of micturition: Secondary | ICD-10-CM | POA: Diagnosis not present

## 2022-12-13 DIAGNOSIS — Z191 Hormone sensitive malignancy status: Secondary | ICD-10-CM | POA: Diagnosis not present

## 2022-12-13 DIAGNOSIS — Z510 Encounter for antineoplastic radiation therapy: Secondary | ICD-10-CM | POA: Diagnosis not present

## 2022-12-13 DIAGNOSIS — Z85828 Personal history of other malignant neoplasm of skin: Secondary | ICD-10-CM | POA: Insufficient documentation

## 2022-12-13 DIAGNOSIS — E785 Hyperlipidemia, unspecified: Secondary | ICD-10-CM | POA: Insufficient documentation

## 2022-12-13 DIAGNOSIS — R351 Nocturia: Secondary | ICD-10-CM | POA: Diagnosis not present

## 2022-12-13 DIAGNOSIS — I10 Essential (primary) hypertension: Secondary | ICD-10-CM | POA: Insufficient documentation

## 2022-12-13 DIAGNOSIS — Z7982 Long term (current) use of aspirin: Secondary | ICD-10-CM | POA: Insufficient documentation

## 2022-12-13 DIAGNOSIS — Z8042 Family history of malignant neoplasm of prostate: Secondary | ICD-10-CM | POA: Diagnosis not present

## 2022-12-13 DIAGNOSIS — Z85118 Personal history of other malignant neoplasm of bronchus and lung: Secondary | ICD-10-CM | POA: Diagnosis not present

## 2022-12-13 DIAGNOSIS — Z79899 Other long term (current) drug therapy: Secondary | ICD-10-CM | POA: Diagnosis not present

## 2022-12-13 DIAGNOSIS — Z87891 Personal history of nicotine dependence: Secondary | ICD-10-CM | POA: Diagnosis not present

## 2022-12-13 DIAGNOSIS — D649 Anemia, unspecified: Secondary | ICD-10-CM | POA: Diagnosis not present

## 2022-12-13 DIAGNOSIS — C61 Malignant neoplasm of prostate: Secondary | ICD-10-CM | POA: Insufficient documentation

## 2022-12-13 LAB — RAD ONC ARIA SESSION SUMMARY
Course Elapsed Days: 56
Plan Fractions Treated to Date: 39
Plan Prescribed Dose Per Fraction: 2.075 Gy
Plan Total Fractions Prescribed: 40
Plan Total Prescribed Dose: 83 Gy
Reference Point Dosage Given to Date: 80.925 Gy
Reference Point Session Dosage Given: 2.075 Gy
Session Number: 39

## 2022-12-14 ENCOUNTER — Ambulatory Visit
Admission: RE | Admit: 2022-12-14 | Discharge: 2022-12-14 | Disposition: A | Discharge status: Home / Self Care | Payer: Medicare Other | Source: Ambulatory Visit | Attending: Radiation Oncology | Admitting: Radiation Oncology

## 2022-12-14 ENCOUNTER — Other Ambulatory Visit: Payer: Self-pay

## 2022-12-14 DIAGNOSIS — Z510 Encounter for antineoplastic radiation therapy: Secondary | ICD-10-CM | POA: Diagnosis not present

## 2022-12-14 DIAGNOSIS — Z191 Hormone sensitive malignancy status: Secondary | ICD-10-CM | POA: Diagnosis not present

## 2022-12-14 DIAGNOSIS — R350 Frequency of micturition: Secondary | ICD-10-CM | POA: Diagnosis not present

## 2022-12-14 DIAGNOSIS — R351 Nocturia: Secondary | ICD-10-CM | POA: Diagnosis not present

## 2022-12-14 DIAGNOSIS — E785 Hyperlipidemia, unspecified: Secondary | ICD-10-CM | POA: Diagnosis not present

## 2022-12-14 DIAGNOSIS — C61 Malignant neoplasm of prostate: Secondary | ICD-10-CM | POA: Diagnosis not present

## 2022-12-14 DIAGNOSIS — I10 Essential (primary) hypertension: Secondary | ICD-10-CM | POA: Diagnosis not present

## 2022-12-14 LAB — RAD ONC ARIA SESSION SUMMARY
Course Elapsed Days: 57
Plan Fractions Treated to Date: 40
Plan Prescribed Dose Per Fraction: 2.075 Gy
Plan Total Fractions Prescribed: 40
Plan Total Prescribed Dose: 83 Gy
Reference Point Dosage Given to Date: 83 Gy
Reference Point Session Dosage Given: 2.075 Gy
Session Number: 40

## 2022-12-16 NOTE — Radiation Completion Notes (Signed)
Patient Name: Brett Mcguire, Brett Mcguire MRN: [id number] Date of Birth: Apr 16, 1945 Referring Physician: Crawford Givens, M.D. Date of Service: 2022-12-16 Radiation Oncologist: Carmina Miller, M.D. [facility]                             RADIATION ONCOLOGY END OF TREATMENT NOTE     Diagnosis: C61 Malignant neoplasm of prostate Intent: Curative     HPI: Patient is a 77 year old male who has been followed by Dr. Sheppard Penton since 2015.  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  His PSA climbed to 10.7 back in November 2023 showing no findings of intermediate to high-grade risk for prostate cancer.  His PSA in June 2024 was 11.8 he underwent prostate biopsy showing 4 of 12 cores positive for Gleason 7 (4+3) adenocarcinoma.  He had a PSMA PET scan showing only increased uptake in the prostate no evidence of bone metastasis or lymph node involvement.  He is seen today for radiation oncology evaluation.  He does have frequency of urination as well as nocturia he states he is allergic to most urologic medications.  Patient is on methotrexate for skin lesions.      ==========DELIVERED PLANS==========  First Treatment Date: 2022-10-18 Last Treatment Date: 2022-12-14   Plan Name: Prostate Site: Prostate Technique: IMRT Mode: Photon Dose Per Fraction: 2.08 Gy Prescribed Dose (Delivered / Prescribed): 83 Gy / 83 Gy Prescribed Fxs (Delivered / Prescribed): 40 / 40     ==========ON TREATMENT VISIT DATES========== 2022-10-19, 2022-10-26, 2022-11-02, 2022-11-09, 2022-11-16, 2022-11-23, 2022-11-30, 2022-12-07, 2022-12-14     ==========UPCOMING VISITS========== 01/19/2023 [facility] ONCOLOGY FOLLOW UP 30 Carmina Miller, MD  12/29/2022 [facility] OFF[facility] VISIT Riki Altes, MD  12/27/2022 [facility]        ==========APPENDIX - ON TREATMENT VISIT NOTES==========   See weekly On Treatment  Notes in Epic for details in the Media tab (listed as Progress notes on the On Treatment Visit Dates listed above).

## 2022-12-22 DIAGNOSIS — M9903 Segmental and somatic dysfunction of lumbar region: Secondary | ICD-10-CM | POA: Diagnosis not present

## 2022-12-22 DIAGNOSIS — M4306 Spondylolysis, lumbar region: Secondary | ICD-10-CM | POA: Diagnosis not present

## 2022-12-22 DIAGNOSIS — M9905 Segmental and somatic dysfunction of pelvic region: Secondary | ICD-10-CM | POA: Diagnosis not present

## 2022-12-22 DIAGNOSIS — M545 Low back pain, unspecified: Secondary | ICD-10-CM | POA: Diagnosis not present

## 2022-12-24 ENCOUNTER — Other Ambulatory Visit: Payer: Self-pay

## 2022-12-24 DIAGNOSIS — C61 Malignant neoplasm of prostate: Secondary | ICD-10-CM

## 2022-12-27 ENCOUNTER — Other Ambulatory Visit: Payer: Medicare Other

## 2022-12-27 DIAGNOSIS — C61 Malignant neoplasm of prostate: Secondary | ICD-10-CM | POA: Diagnosis not present

## 2022-12-28 LAB — PSA: Prostate Specific Ag, Serum: <0.1 ng/mL (ref 0.0–4.0)

## 2022-12-29 ENCOUNTER — Encounter: Payer: Self-pay | Admitting: Urology

## 2022-12-29 ENCOUNTER — Ambulatory Visit (INDEPENDENT_AMBULATORY_CARE_PROVIDER_SITE_OTHER): Payer: Medicare Other | Admitting: Urology

## 2022-12-29 VITALS — BP 144/78 | HR 84 | Ht 68.0 in | Wt 161.0 lb

## 2022-12-29 DIAGNOSIS — C61 Malignant neoplasm of prostate: Secondary | ICD-10-CM | POA: Diagnosis not present

## 2022-12-29 NOTE — Progress Notes (Signed)
I,Brett Mcguire,acting as a scribe for Brett Altes, Mcguire.,have documented all relevant documentation on the behalf of Brett Altes, Mcguire,as directed by  Brett Altes, Mcguire while in the presence of Brett Altes, Mcguire.  12/29/2022 6:24 PM   Brett Mcguire May 29, 1945 [id number]  Referring provider: Joaquim Nam, Mcguire [address] [place],  [place] [address]  Chief Complaint  Patient presents with   Prostate Cancer    Urologic History: Prostate cancer Multiple prior biopsies which have shown atypia and high-grade PIN.  Most recent biopsies performed in 2015, 2017 Last PSA 11/02/2021 was 10.7.  MRI November 2023 which showed a 49.5 cc gland  TRUS/ biopsy prostate 06/23/2022. PSA 11.8; prostate volume 61 grams. Pathology 6/6 left sided cores benign, 4/12 right sided cores positive for Gleason 4+3 in 2 cores  3+4 in 2 cores. Elected EBRT+ADT x 6 mos.   HPI: Brett Mcguire is a 77 y.o. male presents for prostate biopsy follow up.  Completed EBRT 12/14/2022. PSA was drawn 12/27/2022 which was undetectable <0.1 He has lower urinary tracts symptoms of frequency, urgency, and nocturia x5, however he states they are improving. He has radiation oncology follow-up appointment next month.   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 Excell Seltzer and Dr. Cornelius Moras   Severe aortic stenosis     Surgical History: Past Surgical History:  Procedure Laterality Date   COLONOSCOPY   02/18/2000   Polyp, diverticulosis, repeat in 5 years   COLONOSCOPY  03/03/2006   Repeat  -  Divertics   COLONOSCOPY WITH PROPOFOL N/A 12/22/2017   Procedure: COLONOSCOPY WITH PROPOFOL;  Surgeon: Pasty Spillers, Mcguire;  Location: [facility] ENDOSCOPY;  Service: Endoscopy;  Laterality: N/A;   COLONOSCOPY WITH PROPOFOL N/A 01/22/2021   Procedure: COLONOSCOPY WITH PROPOFOL;  Surgeon: Wyline Mood, Mcguire;  Location[facility]COPY;  Service: Gastroenterology;  Laterality: N/A;   CYSTOSCOPY WITH INSERTION OF UROLIFT     Laminectomy/Discectomy  11/03/2005   L5/S1   Malignant melanoma excision  06/20/2003   Dr. Gwen Pounds   MRI  10/09/2005   Lumbar spine, bulging disc L5/S1   Right lobectomy  1989   Lung cancer   RIGHT/LEFT HEART CATH AND CORONARY ANGIOGRAPHY Bilateral 09/06/2019   Procedure: RIGHT/LEFT HEART CATH AND CORONARY ANGIOGRAPHY;  Surgeon: Antonieta Iba, Mcguire;  Location: [facility] INVASIVE CV LAB;  Service: Cardiovascular;  Laterality: Bilateral;   TEE WITHOUT CARDIOVERSION N/A 10/02/2019   Procedure: TRANSESOPHAGEAL ECHOCARDIOGRAM (TEE);  Surgeon: Tonny Bollman, Mcguire;  Location: [facility] OR;  Service: Open Heart Surgery;  Laterality: N/A;   TONSILLECTOMY AND ADENOIDECTOMY     TRANSCATHETER AORTIC VALVE REPLACEMENT, TRANSFEMORAL  10/02/2019   Transcatheter Aortic Valve Replacement - Percutaneous Left Transfemoral Approach   TRANSCATHETER AORTIC VALVE REPLACEMENT, TRANSFEMORAL N/A 10/02/2019   Procedure: TRANSCATHETER AORTIC VALVE REPLACEMENT, TRANSFEMORAL USING EDWARDS SAPIEN 3 ULTRA  26 MM THV.;  Surgeon: Tonny Bollman, Mcguire;  Location: [facility] OR;  Service: Open Heart Surgery;  Laterality: N/A;    Home Medications:  Allergies as of 12/29/2022       Reactions   Atorvastatin    Aches at 40mg  a day.    Ephedrine Other (See Comments)   BP spikes Other reaction(s): Other (See Comments) BP spikes   Finasteride Diarrhea   diarrhea   Losartan    Cough.  Improved off med.     Spironolactone    Urinary  frequency/nocturia.    Sulfa Antibiotics    Other reaction(s): Other (See Comments) REACTION: as child   Sulfonamide Derivatives Other (See Comments)   REACTION: as child   Tadalafil Other (See Comments)   heartburn Other reaction(s): Other (See Comments) heartburn   Alfuzosin Rash, Other (See Comments)   Skin rash, burning sensation of skin Other reaction(s): Other (See Comments) Skin rash, burning sensation of skin   Doxazosin Rash, Other (See Comments)   Skin rash, burning sensation of skin Other reaction(s): Other (See Comments) Skin rash, burning sensation of skin   Flomax [tamsulosin Hcl] Rash   Rash   Rapaflo [silodosin] Rash   rash        Medication List        Accurate as of December 29, 2022  6:24 PM. If you have any questions, ask your nurse or doctor.          amLODipine 5 MG tablet Commonly known as: NORVASC Take 1 tablet (5 mg total) by mouth daily.   amoxicillin 500 MG capsule Commonly known as: AMOXIL Take 2,000 mg by mouth as directed. Take 2,000 mg one hour prior to all dental visits.   aspirin 81 MG chewable tablet Chew 1 tablet (81 mg total) by mouth daily.   atorvastatin 20 MG tablet Commonly known as: LIPITOR Take 1 tablet (20 mg total) by mouth every Monday, Wednesday, and Friday.   eucerin cream Apply 1 application topically daily as needed for dry skin.   folic acid 1 MG tablet Commonly known as: FOLVITE Take 1 mg by mouth See admin instructions. Take 1 tablet (1 mg) by mouth on 6 days in the week, do NOT take on Mondays.   methotrexate 2.5 MG tablet Commonly known as: RHEUMATREX Take 10 mg by mouth every Monday.   Systane Ultra 0.4-0.3 % Soln Generic drug: Polyethyl Glycol-Propyl Glycol Place 1 drop into both eyes daily as needed (dry/irritated eyes.).        Allergies:  Allergies  Allergen Reactions   Atorvastatin     Aches at 40mg  a day.    Ephedrine Other (See Comments)    BP spikes  Other reaction(s): Other  (See Comments) BP spikes   Finasteride Diarrhea    diarrhea   Losartan     Cough.  Improved off med.     Spironolactone     Urinary frequency/nocturia.    Sulfa Antibiotics     Other reaction(s): Other (See Comments) REACTION: as child   Sulfonamide Derivatives Other (See Comments)    REACTION: as child   Tadalafil Other (See Comments)    heartburn Other reaction(s): Other (See Comments) heartburn   Alfuzosin Rash and Other (See Comments)    Skin rash, burning sensation of skin Other reaction(s): Other (See Comments) Skin rash, burning sensation of skin   Doxazosin Rash and Other (See Comments)    Skin rash, burning sensation of skin Other reaction(s): Other (See Comments) Skin rash,  burning sensation of skin   Flomax [Tamsulosin Hcl] Rash    Rash   Rapaflo [Silodosin] Rash    rash    Family History: Family History  Problem Relation Age of Onset   Cancer Mother        [place] throat   Alcohol abuse Mother    Cancer Father        4 kinds; 5 places, prostate and lung   Prostate cancer Father        possible prostate cancer   Cancer Other        Skin, deceased in January 21, 1995   Colon cancer Neg Hx        none known    Social History:  reports that he quit smoking about 40 years ago. His smoking use included cigarettes. He started smoking about 57 years ago. He has a 34 pack-year smoking history. He has never used smokeless tobacco. He reports that he does not drink alcohol and does not use drugs.   Physical Exam: BP (!) 144/78   Pulse 84   Ht 5\' 8"  (1.727 m)   Wt 161 lb (73 kg)   BMI 24.48 kg/m   Constitutional:  Alert and oriented, No acute distress. HEENT: [place] AT Respiratory: Normal respiratory effort, no increased work of breathing. Psychiatric: Normal mood and affect.   Assessment & Plan:    1. T1c intermediate unfavorable risk prostate cancer S/p EBRT + ADT x 6 months.  He states that his voiding symptoms are not bothersome enough that he desires medical  management. He has follow-up in radiation oncology in January 2025. We will see him back with a PSA in April 2025.   I have reviewed the above documentation for accuracy and completeness, and I agree with the above.   Brett Altes, Mcguire  [facility] [address], [address] [place], [place] [address] [phone]

## 2023-01-19 ENCOUNTER — Ambulatory Visit
Admission: RE | Admit: 2023-01-19 | Discharge: 2023-01-19 | Disposition: A | Discharge status: Home / Self Care | Payer: Medicare Other | Source: Ambulatory Visit | Attending: Radiation Oncology | Admitting: Radiation Oncology

## 2023-01-19 ENCOUNTER — Encounter: Payer: Self-pay | Admitting: Radiation Oncology

## 2023-01-19 VITALS — BP 162/81 | HR 79 | Temp 97.0°F | Resp 14 | Ht 68.0 in | Wt 170.7 lb

## 2023-01-19 DIAGNOSIS — R350 Frequency of micturition: Secondary | ICD-10-CM | POA: Diagnosis not present

## 2023-01-19 DIAGNOSIS — M9903 Segmental and somatic dysfunction of lumbar region: Secondary | ICD-10-CM | POA: Diagnosis not present

## 2023-01-19 DIAGNOSIS — Z923 Personal history of irradiation: Secondary | ICD-10-CM | POA: Insufficient documentation

## 2023-01-19 DIAGNOSIS — R351 Nocturia: Secondary | ICD-10-CM | POA: Diagnosis not present

## 2023-01-19 DIAGNOSIS — C61 Malignant neoplasm of prostate: Secondary | ICD-10-CM | POA: Diagnosis not present

## 2023-01-19 DIAGNOSIS — M545 Low back pain, unspecified: Secondary | ICD-10-CM | POA: Diagnosis not present

## 2023-01-19 DIAGNOSIS — M4306 Spondylolysis, lumbar region: Secondary | ICD-10-CM | POA: Diagnosis not present

## 2023-01-19 DIAGNOSIS — M9905 Segmental and somatic dysfunction of pelvic region: Secondary | ICD-10-CM | POA: Diagnosis not present

## 2023-01-19 NOTE — Progress Notes (Signed)
 Radiation Oncology Follow up Note  Name: Brett Mcguire   Date:   01/19/2023 MRN:  [id number] DOB: August 15, 1945    This 78 y.o. 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  Seen today in routine follow-up he is doing well.  He does have urinary frequency urgency nocturia x 4-5 although this predates his radiation therapy.  He is having no bowel problems at this time.  He is having no bone pain.SABRA  He had a recent PSA ordered through urology which was less than 0.1. COMPLICATIONS OF TREATMENT: none  FOLLOW UP COMPLIANCE: keeps appointments   PHYSICAL EXAM:  BP (!) 162/81   Pulse 79   Temp (!) 97 F (36.1 C)   Resp 14   Ht 5' 8 (1.727 m)   Wt 170 lb 11.2 oz (77.4 kg)   BMI 25.95 kg/m  Well-developed well-nourished patient in NAD. HEENT reveals PERLA, EOMI, discs not visualized.  Oral cavity is clear. No oral mucosal lesions are identified. Neck is clear without evidence of cervical or supraclavicular adenopathy. Lungs are clear to A&P. Cardiac examination is essentially unremarkable with regular rate and rhythm without murmur rub or thrill. Abdomen is benign with no organomegaly or masses noted. Motor sensory and DTR levels are equal and symmetric in the upper and lower extremities. Cranial nerves II through XII are grossly intact. Proprioception is intact. No peripheral adenopathy or edema is identified. No motor or sensory levels are noted. Crude visual fields are within normal range.  RADIOLOGY RESULTS: No current films for review  PLAN: The present time patient is doing well with very low side effect profile.   His urinary issues seem to predate his radiation therapy.  I have asked him about Flomax and other medications to relieve that although he states he is allergic and does not respond to that I will leave that to urology to deal with.  I have asked to see him back in 3 months for follow-up with repeat PSA.  Patient knows to call with any concerns.  I would like to take this opportunity to thank you for allowing me to participate in the care of your patient.SABRA [name], MD

## 2023-02-03 DIAGNOSIS — L821 Other seborrheic keratosis: Secondary | ICD-10-CM | POA: Diagnosis not present

## 2023-02-03 DIAGNOSIS — L538 Other specified erythematous conditions: Secondary | ICD-10-CM | POA: Diagnosis not present

## 2023-02-03 DIAGNOSIS — D225 Melanocytic nevi of trunk: Secondary | ICD-10-CM | POA: Diagnosis not present

## 2023-02-03 DIAGNOSIS — Z85828 Personal history of other malignant neoplasm of skin: Secondary | ICD-10-CM | POA: Diagnosis not present

## 2023-02-03 DIAGNOSIS — Z08 Encounter for follow-up examination after completed treatment for malignant neoplasm: Secondary | ICD-10-CM | POA: Diagnosis not present

## 2023-02-03 DIAGNOSIS — Z79899 Other long term (current) drug therapy: Secondary | ICD-10-CM | POA: Diagnosis not present

## 2023-02-03 DIAGNOSIS — L2089 Other atopic dermatitis: Secondary | ICD-10-CM | POA: Diagnosis not present

## 2023-02-03 DIAGNOSIS — Z85820 Personal history of malignant melanoma of skin: Secondary | ICD-10-CM | POA: Diagnosis not present

## 2023-02-03 DIAGNOSIS — L820 Inflamed seborrheic keratosis: Secondary | ICD-10-CM | POA: Diagnosis not present

## 2023-02-08 DIAGNOSIS — Z79899 Other long term (current) drug therapy: Secondary | ICD-10-CM | POA: Diagnosis not present

## 2023-02-21 DIAGNOSIS — L820 Inflamed seborrheic keratosis: Secondary | ICD-10-CM | POA: Diagnosis not present

## 2023-02-21 DIAGNOSIS — L2989 Other pruritus: Secondary | ICD-10-CM | POA: Diagnosis not present

## 2023-02-21 DIAGNOSIS — L309 Dermatitis, unspecified: Secondary | ICD-10-CM | POA: Diagnosis not present

## 2023-03-24 ENCOUNTER — Telehealth: Payer: Self-pay | Admitting: Family Medicine

## 2023-03-24 DIAGNOSIS — R739 Hyperglycemia, unspecified: Secondary | ICD-10-CM

## 2023-03-24 DIAGNOSIS — E538 Deficiency of other specified B group vitamins: Secondary | ICD-10-CM

## 2023-03-24 DIAGNOSIS — I10 Essential (primary) hypertension: Secondary | ICD-10-CM

## 2023-03-24 DIAGNOSIS — E782 Mixed hyperlipidemia: Secondary | ICD-10-CM

## 2023-03-24 NOTE — Telephone Encounter (Signed)
 Copied from CRM [id number]. Topic: Clinical - Request for Lab/Test Order >> Mar 24, 2023  9:50 AM Brett Mcguire wrote: Reason for CRM: Patient scheduled his physical appointment with Dr. Para March for 3/24 and would have to have labs done prior to appointment, patient is scheduled for the lab on 3/21, thanks.

## 2023-03-24 NOTE — Addendum Note (Signed)
 Addended by: Joaquim Nam on: 03/24/2023 09:48 PM   Modules accepted: Orders

## 2023-03-24 NOTE — Telephone Encounter (Signed)
I put in the orders.  Thanks.

## 2023-03-30 ENCOUNTER — Ambulatory Visit (INDEPENDENT_AMBULATORY_CARE_PROVIDER_SITE_OTHER): Payer: Medicare Other

## 2023-03-30 VITALS — Ht 71.0 in | Wt 151.0 lb

## 2023-03-30 DIAGNOSIS — Z0000 Encounter for general adult medical examination without abnormal findings: Secondary | ICD-10-CM

## 2023-03-30 NOTE — Progress Notes (Signed)
 Subjective:   HAWKEN BIELBY is a 78 y.o. who presents for a Medicare Wellness preventive visit.  Visit Complete: Virtual I connected with  Aram Candela Pelaez on 03/30/23 by a audio enabled telemedicine application and verified that I am speaking with the correct person using two identifiers.  Patient Location: Home  Provider Location: Office/Clinic  I discussed the limitations of evaluation and management by telemedicine. The patient expressed understanding and agreed to proceed.  Vital Signs: Because this visit was a virtual/telehealth visit, some criteria may be missing or patient reported. Any vitals not documented were not able to be obtained and vitals that have been documented are patient reported.  VideoDeclined- This patient declined Librarian, academic. Therefore the visit was completed with audio only.  Persons Participating in Visit: Patient.  AWV Questionnaire: No: Patient Medicare AWV questionnaire was not completed prior to this visit.  Cardiac Risk Factors include: advanced age (>53men, >24 women);dyslipidemia;hypertension;male gender     Objective:    Today's Vitals   03/30/23 0814 03/30/23 0815  Weight: 151 lb (68.5 kg)   Height: 5\' 11"  (1.803 m)   PainSc:  7    Body mass index is 21.06 kg/m.     03/30/2023    8:23 AM 01/19/2023    9:11 AM 07/22/2022    1:29 PM 03/25/2022   10:38 AM 01/22/2021    8:50 AM 11/08/2019    1:56 PM 10/02/2019    1:55 PM  Advanced Directives  Does Patient Have a Medical Advance Directive? Yes No No Yes No No No  Type of Estate agent of [employer];Living will   Healthcare Power of [place];Living will     Copy of Healthcare Power of Attorney in Chart? No - copy requested   No - copy requested     Would patient like information on creating a medical advance directive?  No - Patient declined No - Patient declined    No - Patient declined    Current Medications (verified) Outpatient  Encounter Medications as of 03/30/2023  Medication Sig   amLODipine (NORVASC) 5 MG tablet Take 1 tablet (5 mg total) by mouth daily.   amoxicillin (AMOXIL) 500 MG capsule Take 2,000 mg by mouth as directed. Take 2,000 mg one hour prior to all dental visits.   aspirin 81 MG chewable tablet Chew 1 tablet (81 mg total) by mouth daily.   atorvastatin (LIPITOR) 20 MG tablet Take 1 tablet (20 mg total) by mouth every Monday, Wednesday, and Friday.   folic acid (FOLVITE) 1 MG tablet Take 1 mg by mouth See admin instructions. Take 1 tablet (1 mg) by mouth on 6 days in the week, do NOT take on Mondays.   methotrexate 2.5 MG tablet Take 10 mg by mouth every Monday.    Polyethyl Glycol-Propyl Glycol (SYSTANE ULTRA) 0.4-0.3 % SOLN Place 1 drop into both eyes daily as needed (dry/irritated eyes.).   Skin Protectants, Misc. (EUCERIN) cream Apply 1 application topically daily as needed for dry skin.   No facility-administered encounter medications on file as of 03/30/2023.    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 Excell Seltzer and Dr. Cornelius Moras   Severe aortic stenosis    Past Surgical History:  Procedure Laterality Date   COLONOSCOPY  02/18/2000   Polyp, diverticulosis, repeat in 5 years    COLONOSCOPY  03/03/2006   Repeat  -  Divertics   COLONOSCOPY WITH PROPOFOL N/A 12/22/2017   Procedure: COLONOSCOPY WITH PROPOFOL;  Surgeon: Pasty Spillers, MD;  Location: [facility] ENDOSCOPY;  Service: Endoscopy;  Laterality: N/A;   COLONOSCOPY WITH PROPOFOL N/A 01/22/2021   Procedure: COLONOSCOPY WITH PROPOFOL;  Surgeon: Wyline Mood, MD;  [facility] ENDOSCOPY;  Service: Gastroenterology;  Laterality: N/A;   CYSTOSCOPY WITH INSERTION OF UROLIFT     Laminectomy/Discectomy  11/03/2005   L5/S1   Malignant melanoma excision  06/20/2003   Dr. Gwen Pounds   MRI  10/09/2005   Lumbar spine, bulging disc L5/S1   Right lobectomy  1989   Lung cancer   RIGHT/LEFT HEART CATH AND CORONARY ANGIOGRAPHY Bilateral 09/06/2019   Procedure: RIGHT/LEFT HEART CATH AND CORONARY ANGIOGRAPHY;  Surgeon: Antonieta Iba, MD;  Location: [facility] INVASIVE CV LAB;  Service: Cardiovascular;  Laterality: Bilateral;   TEE WITHOUT CARDIOVERSION N/A 10/02/2019   Procedure: TRANSESOPHAGEAL ECHOCARDIOGRAM (TEE);  Surgeon: Tonny Bollman, MD;  Location: [facility] OR;  Service: Open Heart Surgery;  Laterality: N/A;   TONSILLECTOMY AND ADENOIDECTOMY     TRANSCATHETER AORTIC VALVE REPLACEMENT, TRANSFEMORAL  10/02/2019   Transcatheter Aortic Valve Replacement - Percutaneous Left Transfemoral Approach   TRANSCATHETER AORTIC VALVE REPLACEMENT, TRANSFEMORAL N/A 10/02/2019   Procedure: TRANSCATHETER AORTIC VALVE REPLACEMENT, TRANSFEMORAL USING EDWARDS SAPIEN 3 ULTRA 26 MM THV.;  Surgeon: Tonny Bollman, MD;  Location: [facility] OR;  Service: Open Heart Surgery;  Laterality: N/A;   Family History  Problem Relation Age of Onset   Cancer Mother        [place] throat   Alcohol abuse Mother    Cancer Father        4 kinds; 5 places, prostate and lung   Prostate cancer Father        possible prostate cancer   Cancer Other        Skin, deceased in 22   Colon cancer Neg Hx        none known   Social History   Socioeconomic History   Marital  status: Married    Spouse name: Lynden Ang   Number of children: 2   Years of education: Not on file   Highest education level: Not on file  Occupational History   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    Married 50+ years   Social Drivers of Corporate investment banker Strain: Low Risk  (03/25/2022)   Overall Financial Resource Strain (CARDIA)    Difficulty of Paying Living Expenses: Not hard at all  Food Insecurity: No Food Insecurity (03/30/2023)   Hunger Vital Sign    Worried About Running Out of Food in the Last Year: Never true    Ran Out of Food in the Last Year: Never true  Transportation Needs: No Transportation Needs (03/30/2023)   PRAPARE - Administrator, Civil Service (Medical): No    Lack of Transportation (Non-Medical): No  Physical Activity: Sufficiently Active (03/30/2023)   Exercise Vital Sign    Days of Exercise per Week: 7 days    Minutes of Exercise per Session: 120 min  Stress: No Stress Concern Present (03/30/2023)   [employer] of Occupational Health - Occupational Stress Questionnaire    Feeling of Stress : Not at all  Social Connections: Moderately Isolated (03/30/2023)   Social Connection and Isolation Panel [NHANES]    Frequency of Communication with Friends and Family: More than three times a week    Frequency of Social Gatherings with Friends and Family:  More than three times a week    Attends Religious Services: Never    Database administrator or Organizations: No    Attends Engineer, structural: Never    Marital Status: Married    Tobacco Counseling Counseling given: Not Answered    Clinical Intake:  Pre-visit preparation completed: Yes  Pain : 0-10 Pain Score: 7  Pain Type: Chronic pain Pain Location: Back Pain Orientation: Lower Pain Descriptors / Indicators: Aching Pain Onset: More than a month ago Pain Frequency: Intermittent Pain Relieving Factors: Tylenol  Pain Relieving Factors: Tylenol  BMI - recorded: 21.06 Nutritional Status: BMI of 19-24  Normal Nutritional Risks: None Diabetes: No  Lab Results  Component Value Date   HGBA1C 5.8 (H) 11/09/2019   HGBA1C 5.7 (H) 11/08/2019   HGBA1C 5.9 (H) 09/28/2019     How often do you need to have someone help you when you read instructions, pamphlets, or other written materials from your doctor or pharmacy?: 1 - Never  Interpreter Needed?: No  Comments: lives with wife Information entered by :: B.Marney Treloar,LPN   Activities of Daily Living     03/30/2023    8:24 AM  In your present state of health, do you have any difficulty performing the following activities:  Hearing? 1  Vision? 0  Difficulty concentrating or making decisions? 1  Walking or climbing stairs? 0  Dressing or bathing? 0  Doing errands, shopping? 0  Preparing Food and eating ? N  Using the Toilet? N  In the past six months, have you accidently leaked urine? N  Do you have problems with loss of bowel control? N  Managing your Medications? N  Managing your Finances? N  Housekeeping or managing your Housekeeping? N    Patient Care Team: Joaquim Nam, MD as PCP - General (Family Medicine) Mariah Milling Tollie Pizza, MD as PCP - Cardiology (Cardiology) Antonieta Iba, MD as Consulting Physician (Cardiology) Orson Ape, MD (Urology)  Indicate any recent Medical Services you  may have received from other than [facility] providers in the past year (date may be approximate).  Assessment:   This is a routine wellness examination for [place].  Hearing/Vision screen Hearing Screening - Comments:: Pt says his hearing is good except lft ear clog sometimes Vision Screening - Comments:: Pt says his vision is fine Dr Alvester Morin   Goals Addressed             This Visit's Progress    COMPLETED: Increase physical activity       Starting 06/06/2018, I will continue to walk up to 2 miles daily.      Patient Stated   Not on track    03/30/23-Get psoriasis under control.       Depression Screen     03/30/2023    8:21 AM 03/26/2022    9:04 AM 03/25/2022   10:36 AM 03/23/2021    9:03 AM 12/11/2019    2:00 PM 06/06/2018   11:08 AM 05/31/2017   10:49 AM  PHQ 2/9 Scores  PHQ - 2 Score 0 0 0 0 1 0 1  PHQ- 9 Score  2   3 0 3    Fall Risk     03/30/2023    8:18 AM 03/26/2022    9:04 AM 03/25/2022   10:31 AM 03/23/2021    9:03 AM 12/11/2019    2:01 PM  Fall Risk   Falls in the past year? 0 0 0 0 0  Number falls in past yr: 0 0 0 0 0  Injury with Fall? 0 0 0 0 0  Risk for fall due to : No Fall Risks No Fall Risks No Fall Risks No Fall Risks   Follow up Falls prevention discussed;Education provided Falls evaluation completed Falls prevention discussed;Falls evaluation completed Falls evaluation completed     MEDICARE RISK AT HOME:  Medicare Risk at Home Any stairs in or around the home?: Yes If so, are there any without handrails?: Yes Home free of loose throw rugs in walkways, pet beds, electrical cords, etc?: Yes Adequate lighting in your home to reduce risk of falls?: Yes Life alert?: No Use of a cane, walker or w/c?: No Grab bars in the bathroom?: No Shower chair or bench in shower?: No Elevated toilet seat or a handicapped toilet?: No  TIMED UP AND GO:  Was the test performed?  No  Cognitive Function: 6CIT completed    06/06/2018   11:50 AM 05/31/2017   10:49  AM 05/26/2016    2:10 PM  MMSE - Mini Mental State Exam  Orientation to time 5 5 5   Orientation to Place 5 5 5   Registration 3 3 3   Attention/ Calculation 0 0 0  Recall 3 2 3   Recall-comments  unable to recall 1 of 3 words   Language- name 2 objects 0 0 0  Language- repeat 1 1 1   Language- follow 3 step command 0 3 3  Language- read & follow direction 0 0 0  Write a sentence 0 0 0  Copy design 0 0 0  Total score 17 19 20         03/30/2023    8:26 AM 03/25/2022   10:40 AM  6CIT Screen  What Year? 0 points 0 points  What month? 0 points 0 points  What time? 0 points 0 points  Count back from 20 0 points 0 points  Months in reverse 0 points 0 points  Repeat phrase 0 points 0 points  Total Score 0 points 0 points    Immunizations Immunization History  Administered Date(s) Administered  Fluad Quad(high Dose 65+) 12/11/2019, 03/23/2021, 03/26/2022   Influenza, High Dose Seasonal PF 11/24/2018   Influenza,inj,Quad PF,6+ Mos 11/01/2016, 11/08/2017   Influenza-Unspecified 10/31/2014, 11/13/2014, 12/01/2015   PFIZER(Purple Top)SARS-COV-2 Vaccination 03/15/2019, 04/05/2019   Pneumococcal Conjugate-13 02/24/2015   Pneumococcal Polysaccharide-23 01/11/1994   Pneumococcal-Unspecified 01/16/2012   Td 02/09/2006   Tdap 05/15/2014   Zoster, Live 08/11/2013    Screening Tests Health Maintenance  Topic Date Due   Zoster Vaccines- Shingrix (1 of 2) 12/04/1964   Pneumonia Vaccine 40+ Years old (3 of 3 - PPSV23 or PCV20) 04/21/2015   INFLUENZA VACCINE  08/12/2022   Colonoscopy  01/23/2024   Medicare Annual Wellness (AWV)  03/29/2024   DTaP/Tdap/Td (3 - Td or Tdap) 05/14/2024   Hepatitis C Screening  Completed   HPV VACCINES  Aged Out   COVID-19 Vaccine  Discontinued    Health Maintenance  Health Maintenance Due  Topic Date Due   Zoster Vaccines- Shingrix (1 of 2) 12/04/1964   Pneumonia Vaccine 26+ Years old (3 of 3 - PPSV23 or PCV20) 04/21/2015   INFLUENZA VACCINE   08/12/2022   Health Maintenance Items Addressed: none needed  Additional Screening:  Vision Screening: Recommended annual ophthalmology exams for early detection of glaucoma and other disorders of the eye.  Dental Screening: Recommended annual dental exams for proper oral hygiene  Community Resource Referral / Chronic Care Management: CRR required this visit?  No   CCM required this visit?  No    Plan:     I have personally reviewed and noted the following in the patient's chart:   Medical and social history Use of alcohol, tobacco or illicit drugs  Current medications and supplements including opioid prescriptions. Patient is not currently taking opioid prescriptions. Functional ability and status Nutritional status Physical activity Advanced directives List of other physicians Hospitalizations, surgeries, and ER visits in previous 12 months Vitals Screenings to include cognitive, depression, and falls Referrals and appointments  In addition, I have reviewed and discussed with patient certain preventive protocols, quality metrics, and best practice recommendations. A written personalized care plan for preventive services as well as general preventive health recommendations were provided to patient.   Sue Lush, LPN   [id number]   After Visit Summary: (Declined) Due to this being a telephonic visit, with patients personalized plan was offered to patient but patient Declined AVS at this time   Notes: Nothing significant to report at this time.

## 2023-03-30 NOTE — Patient Instructions (Signed)
 Mr. Brett Mcguire , Thank you for taking time to come for your Medicare Wellness Visit. I appreciate your ongoing commitment to your health goals. Please review the following plan we discussed and let me know if I can assist you in the future.   Referrals/Orders/Follow-Ups/Clinician Recommendations: none  This is a list of the screening recommended for you and due dates:  Health Maintenance  Topic Date Due   Zoster (Shingles) Vaccine (1 of 2) 12/04/1964   Pneumonia Vaccine (3 of 3 - PPSV23 or PCV20) 04/21/2015   Flu Shot  08/12/2022   Colon Cancer Screening  01/23/2024   Medicare Annual Wellness Visit  03/29/2024   DTaP/Tdap/Td vaccine (3 - Td or Tdap) 05/14/2024   Hepatitis C Screening  Completed   HPV Vaccine  Aged Out   COVID-19 Vaccine  Discontinued    Advanced directives: (Copy Requested) Please bring a copy of your health care power of attorney and living will to the office to be added to your chart at your convenience. You can mail to [facility] [address] [place], [place] [address] or email to [email or web address]  Next Medicare Annual Wellness Visit scheduled for next year: Yes 03/30/24 @ 8:10am televisit

## 2023-04-01 ENCOUNTER — Other Ambulatory Visit (INDEPENDENT_AMBULATORY_CARE_PROVIDER_SITE_OTHER)

## 2023-04-01 DIAGNOSIS — R739 Hyperglycemia, unspecified: Secondary | ICD-10-CM | POA: Diagnosis not present

## 2023-04-01 DIAGNOSIS — E538 Deficiency of other specified B group vitamins: Secondary | ICD-10-CM

## 2023-04-01 DIAGNOSIS — E782 Mixed hyperlipidemia: Secondary | ICD-10-CM

## 2023-04-01 DIAGNOSIS — I10 Essential (primary) hypertension: Secondary | ICD-10-CM | POA: Diagnosis not present

## 2023-04-01 LAB — COMPREHENSIVE METABOLIC PANEL
ALT: 17 U/L (ref 0–53)
AST: 21 U/L (ref 0–37)
Albumin: 4.3 g/dL (ref 3.5–5.2)
Alkaline Phosphatase: 70 U/L (ref 39–117)
BUN: 20 mg/dL (ref 6–23)
CO2: 28 meq/L (ref 19–32)
Calcium: 10 mg/dL (ref 8.4–10.5)
Chloride: 102 meq/L (ref 96–112)
Creatinine, Ser: 0.83 mg/dL (ref 0.40–1.50)
GFR: 84.53 mL/min (ref >60.00)
Glucose, Bld: 104 mg/dL — ABNORMAL HIGH (ref 70–99)
Potassium: 4.1 meq/L (ref 3.5–5.1)
Sodium: 139 meq/L (ref 135–145)
Total Bilirubin: 0.6 mg/dL (ref 0.2–1.2)
Total Protein: 6.9 g/dL (ref 6.0–8.3)

## 2023-04-01 LAB — CBC WITH DIFFERENTIAL/PLATELET
Basophils Absolute: 0 10*3/uL (ref 0.0–0.1)
Basophils Relative: 0.9 % (ref 0.0–3.0)
Eosinophils Absolute: 0.1 10*3/uL (ref 0.0–0.7)
Eosinophils Relative: 3.6 % (ref 0.0–5.0)
HCT: 39.4 % (ref 39.0–52.0)
Hemoglobin: 13.2 g/dL (ref 13.0–17.0)
Lymphocytes Relative: 23.6 % (ref 12.0–46.0)
Lymphs Abs: 0.9 10*3/uL (ref 0.7–4.0)
MCHC: 33.5 g/dL (ref 30.0–36.0)
MCV: 97 fl (ref 78.0–100.0)
Monocytes Absolute: 0.4 10*3/uL (ref 0.1–1.0)
Monocytes Relative: 10.8 % (ref 3.0–12.0)
Neutro Abs: 2.2 10*3/uL (ref 1.4–7.7)
Neutrophils Relative %: 61.1 % (ref 43.0–77.0)
Platelets: 307 10*3/uL (ref 150.0–400.0)
RBC: 4.06 Mil/uL — ABNORMAL LOW (ref 4.22–5.81)
RDW: 14.5 % (ref 11.5–15.5)
WBC: 3.6 10*3/uL — ABNORMAL LOW (ref 4.0–10.5)

## 2023-04-01 LAB — HEMOGLOBIN A1C: Hgb A1c MFr Bld: 6.2 % (ref 4.6–6.5)

## 2023-04-01 LAB — LIPID PANEL
Cholesterol: 152 mg/dL (ref 0–200)
HDL: 49.8 mg/dL (ref >39.00)
LDL Cholesterol: 69 mg/dL (ref 0–99)
NonHDL: 101.87
Total CHOL/HDL Ratio: 3
Triglycerides: 165 mg/dL — ABNORMAL HIGH (ref 0.0–149.0)
VLDL: 33 mg/dL (ref 0.0–40.0)

## 2023-04-01 LAB — TSH: TSH: 1.91 u[IU]/mL (ref 0.35–5.50)

## 2023-04-01 LAB — VITAMIN B12: Vitamin B-12: 240 pg/mL (ref 211–911)

## 2023-04-04 ENCOUNTER — Encounter: Payer: Self-pay | Admitting: Family Medicine

## 2023-04-04 ENCOUNTER — Ambulatory Visit (INDEPENDENT_AMBULATORY_CARE_PROVIDER_SITE_OTHER): Admitting: Family Medicine

## 2023-04-04 VITALS — BP 138/68 | HR 75 | Temp 98.1°F | Ht 68.9 in | Wt 169.2 lb

## 2023-04-04 DIAGNOSIS — R300 Dysuria: Secondary | ICD-10-CM | POA: Diagnosis not present

## 2023-04-04 DIAGNOSIS — L400 Psoriasis vulgaris: Secondary | ICD-10-CM

## 2023-04-04 DIAGNOSIS — I10 Essential (primary) hypertension: Secondary | ICD-10-CM

## 2023-04-04 DIAGNOSIS — Z7189 Other specified counseling: Secondary | ICD-10-CM

## 2023-04-04 DIAGNOSIS — Z952 Presence of prosthetic heart valve: Secondary | ICD-10-CM

## 2023-04-04 DIAGNOSIS — C61 Malignant neoplasm of prostate: Secondary | ICD-10-CM

## 2023-04-04 DIAGNOSIS — Z0000 Encounter for general adult medical examination without abnormal findings: Secondary | ICD-10-CM

## 2023-04-04 DIAGNOSIS — D179 Benign lipomatous neoplasm, unspecified: Secondary | ICD-10-CM | POA: Diagnosis not present

## 2023-04-04 MED ORDER — ATORVASTATIN CALCIUM 20 MG PO TABS: 20.0000 mg | ORAL_TABLET | ORAL | 3 refills | Status: AC

## 2023-04-04 MED ORDER — PHENAZOPYRIDINE HCL 200 MG PO TABS: 200.0000 mg | ORAL_TABLET | Freq: Two times a day (BID) | ORAL | 1 refills | Status: DC | PRN

## 2023-04-04 MED ORDER — PHENAZOPYRIDINE HCL 200 MG PO TABS: 200.0000 mg | ORAL_TABLET | Freq: Three times a day (TID) | ORAL | Status: DC | PRN

## 2023-04-04 MED ORDER — AMLODIPINE BESYLATE 5 MG PO TABS: 5.0000 mg | ORAL_TABLET | Freq: Every day | ORAL | 3 refills | Status: AC

## 2023-04-04 NOTE — Patient Instructions (Addendum)
 Try pyridium as needed.   Take care.  Glad to see you. Urine culture when possible.  Go to the lab on the way out.   If you have mychart we'll likely use that to update you.    Shingles and RSV when possible.   Let me know if the spots on your leg are changing.

## 2023-04-04 NOTE — Progress Notes (Unsigned)
 S/p TAVR.  Not SOB supine.  No CP.  No BLE edema.    Hypertension:    Using medication without problems or lightheadedness: yes Chest pain with exertion:no Edema:no Short of breath: on with sig exertion.    Prostate cancer per urology.   He had radiation treatment.  He still has some dysuria with some mild improvement.  D/w pt about trial of pyridium.  See AVS.   Psoriasis per derm.  Still on methotrexate.  Flu done Shingles discussed with patient, previously had Zostavax. PNA prev done.   Tetanus 2016 RSV d/w pt.   COVID-vaccine previously done Colonoscopy 2023 PSA per urology.  I will defer.  He agrees Advance directive- wife designated if patient were incapacitated  Skin lesion on the R thigh.    Meds, vitals, and allergies reviewed.   PMH and SH reviewed  ROS: Per HPI unless specifically indicated in ROS section   GEN: nad, alert and oriented HEENT: ncat NECK: supple w/o LA CV: rrr. Faint SEM PULM: ctab, no inc wob ABD: soft, +bs EXT: no edema SKIN: no acute rash but likely 4 lipomas on the R thigh.

## 2023-04-05 DIAGNOSIS — R300 Dysuria: Secondary | ICD-10-CM | POA: Diagnosis not present

## 2023-04-06 DIAGNOSIS — C61 Malignant neoplasm of prostate: Secondary | ICD-10-CM | POA: Insufficient documentation

## 2023-04-06 DIAGNOSIS — D179 Benign lipomatous neoplasm, unspecified: Secondary | ICD-10-CM | POA: Insufficient documentation

## 2023-04-06 LAB — URINE CULTURE
MICRO NUMBER:: 16244792
Result:: NO GROWTH
SPECIMEN QUALITY:: ADEQUATE

## 2023-04-06 NOTE — Assessment & Plan Note (Signed)
 Prostate cancer per urology.   He had radiation treatment.  He still has some dysuria with some mild improvement.  D/w pt about trial of pyridium.  See AVS. check urine culture when possible.

## 2023-04-06 NOTE — Assessment & Plan Note (Signed)
 Benign-appearing, discussed.  He can update me as needed.  We can refer to general surgery or dermatology if he needs to have these excised.

## 2023-04-06 NOTE — Assessment & Plan Note (Signed)
 Advance directive- wife designated if patient were incapacitated.

## 2023-04-06 NOTE — Assessment & Plan Note (Signed)
 Psoriasis per derm.  Still on methotrexate.  Rash improved.  I will defer.

## 2023-04-06 NOTE — Assessment & Plan Note (Signed)
 Not short of breath.  Not having chest pain.  He clearly feels better

## 2023-04-06 NOTE — Assessment & Plan Note (Signed)
Continue amlodipine.  Previous labs discussed with patient.

## 2023-04-06 NOTE — Assessment & Plan Note (Signed)
 Flu done Shingles discussed with patient, previously had Zostavax. PNA prev done.   Tetanus 2016 RSV d/w pt.   COVID-vaccine previously done Colonoscopy 2023 PSA per urology.  I will defer.  He agrees Advance directive- wife designated if patient were incapacitated

## 2023-04-12 ENCOUNTER — Other Ambulatory Visit: Payer: Self-pay | Admitting: Family Medicine

## 2023-04-27 ENCOUNTER — Other Ambulatory Visit: Payer: Self-pay

## 2023-04-27 DIAGNOSIS — C61 Malignant neoplasm of prostate: Secondary | ICD-10-CM

## 2023-04-28 ENCOUNTER — Inpatient Hospital Stay: Payer: Medicare Other | Attending: Radiation Oncology

## 2023-04-28 DIAGNOSIS — C61 Malignant neoplasm of prostate: Secondary | ICD-10-CM | POA: Insufficient documentation

## 2023-04-28 LAB — CBC (CANCER CENTER ONLY)
HCT: 37.4 % — ABNORMAL LOW (ref 39.0–52.0)
Hemoglobin: 12.4 g/dL — ABNORMAL LOW (ref 13.0–17.0)
MCH: 31.9 pg (ref 26.0–34.0)
MCHC: 33.2 g/dL (ref 30.0–36.0)
MCV: 96.1 fL (ref 80.0–100.0)
Platelet Count: 263 10*3/uL (ref 150–400)
RBC: 3.89 MIL/uL — ABNORMAL LOW (ref 4.22–5.81)
RDW: 13.8 % (ref 11.5–15.5)
WBC Count: 3.9 10*3/uL — ABNORMAL LOW (ref 4.0–10.5)
nRBC: 0 % (ref 0.0–0.2)

## 2023-04-29 LAB — PSA: Prostatic Specific Antigen: 0.01 ng/mL (ref 0.00–4.00)

## 2023-05-05 ENCOUNTER — Encounter: Payer: Self-pay | Admitting: Radiation Oncology

## 2023-05-05 ENCOUNTER — Ambulatory Visit
Admission: RE | Admit: 2023-05-05 | Discharge: 2023-05-05 | Disposition: A | Discharge status: Home / Self Care | Payer: Medicare Other | Source: Ambulatory Visit | Attending: Radiation Oncology | Admitting: Radiation Oncology

## 2023-05-05 VITALS — BP 144/71 | HR 68 | Temp 97.0°F | Resp 16 | Ht 68.0 in | Wt 172.5 lb

## 2023-05-05 DIAGNOSIS — Z923 Personal history of irradiation: Secondary | ICD-10-CM | POA: Diagnosis not present

## 2023-05-05 DIAGNOSIS — R351 Nocturia: Secondary | ICD-10-CM | POA: Insufficient documentation

## 2023-05-05 DIAGNOSIS — C61 Malignant neoplasm of prostate: Secondary | ICD-10-CM | POA: Insufficient documentation

## 2023-05-05 DIAGNOSIS — Z191 Hormone sensitive malignancy status: Secondary | ICD-10-CM | POA: Diagnosis not present

## 2023-05-05 NOTE — Progress Notes (Signed)
 Radiation Oncology Follow up Note  Name: Brett Mcguire   Date:   05/05/2023 MRN:  [id number] DOB: Feb 13, 1945    This 78 y.o. 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  REFERRING PROVIDER: Donnie Galea, MD  HPI: Patient is a 78 year old male now out 4 months having completed image guided IMRT radiation therapy for Gleason 7 adenocarcinoma the prostate seen today in routine follow-up he is doing well he does have nocturia x 5 although he states this is pretty stable from prior to radiation.  He is having no bowel problems no significant fatigue.  His most recent PSA is dropped to 0.01.  He continues to have some hot flashes which I have suggested vitamin E supplements for.  He states he cannot take any medication such as Flomax has adverse effect on him although he will address some of his nocturia with urology..  COMPLICATIONS OF TREATMENT: none  FOLLOW UP COMPLIANCE: keeps appointments   PHYSICAL EXAM:  BP (!) 144/71   Pulse 68   Temp (!) 97 F (36.1 C) (Tympanic)   Resp 16   Ht 5\' 8"  (1.727 m)   Wt 172 lb 8 oz (78.2 kg)   BMI 26.23 kg/m  Well-developed well-nourished patient in NAD. HEENT reveals PERLA, EOMI, discs not visualized.  Oral cavity is clear. No oral mucosal lesions are identified. Neck is clear without evidence of cervical or supraclavicular adenopathy. Lungs are clear to A&P. Cardiac examination is essentially unremarkable with regular rate and rhythm without murmur rub or thrill. Abdomen is benign with no organomegaly or masses noted. Motor sensory and DTR levels are equal and symmetric in the upper and lower extremities. Cranial nerves II through XII are grossly intact. Proprioception is intact. No peripheral adenopathy or edema is identified. No motor or sensory levels are noted. Crude  visual fields are within normal range.  RADIOLOGY RESULTS: No current films for review  PLAN: Present time patient is doing well under excellent biochemical control of his prostate cancer.  I am pleased with his overall progress.  He will address his nocturia with urology.  I have asked to see him back in 6 months for follow-up with a follow-up PSA at that time.  Patient knows to call with any concerns.  I would like to take this opportunity to thank you for allowing me to participate in the care of your patient.Glenis Langdon, MD

## 2023-05-06 ENCOUNTER — Other Ambulatory Visit: Payer: Self-pay | Admitting: *Deleted

## 2023-05-06 DIAGNOSIS — C61 Malignant neoplasm of prostate: Secondary | ICD-10-CM

## 2023-05-25 DIAGNOSIS — Z0389 Encounter for observation for other suspected diseases and conditions ruled out: Secondary | ICD-10-CM | POA: Diagnosis not present

## 2023-05-25 DIAGNOSIS — W57XXXA Bitten or stung by nonvenomous insect and other nonvenomous arthropods, initial encounter: Secondary | ICD-10-CM | POA: Diagnosis not present

## 2023-06-29 ENCOUNTER — Other Ambulatory Visit: Payer: Self-pay

## 2023-06-29 DIAGNOSIS — R972 Elevated prostate specific antigen [PSA]: Secondary | ICD-10-CM

## 2023-06-30 LAB — PSA: Prostate Specific Ag, Serum: <0.1 ng/mL (ref 0.0–4.0)

## 2023-07-01 ENCOUNTER — Ambulatory Visit (INDEPENDENT_AMBULATORY_CARE_PROVIDER_SITE_OTHER): Payer: Self-pay | Admitting: Urology

## 2023-07-01 ENCOUNTER — Encounter: Payer: Self-pay | Admitting: Urology

## 2023-07-01 VITALS — BP 140/91 | HR 86 | Ht 68.0 in | Wt 161.0 lb

## 2023-07-01 DIAGNOSIS — R399 Unspecified symptoms and signs involving the genitourinary system: Secondary | ICD-10-CM

## 2023-07-01 DIAGNOSIS — C61 Malignant neoplasm of prostate: Secondary | ICD-10-CM

## 2023-07-01 DIAGNOSIS — R351 Nocturia: Secondary | ICD-10-CM | POA: Diagnosis not present

## 2023-07-01 MED ORDER — GEMTESA 75 MG PO TABS: 75.0000 mg | ORAL_TABLET | Freq: Every day | ORAL | Status: DC

## 2023-07-01 NOTE — Progress Notes (Signed)
 07/01/2023 9:14 AM   Brett Mcguire 1945-06-09 [id number]  Referring provider: Donnie Galea, MD [address] [place],  [place] [address]  Chief Complaint  Patient presents with   Prostate Cancer    Urologic History: Prostate cancer Multiple prior biopsies which have shown atypia and high-grade PIN.  Most recent biopsies performed in 2015, 2017 Last PSA 11/02/2021 was 10.7.  MRI November 2023 which showed a 49.5 cc gland  TRUS/ biopsy prostate 06/23/2022. PSA 11.8; prostate volume 61 grams. Pathology 6/6 left sided cores benign, 4/12 right sided cores positive for Gleason 4+3 in 2 cores  3+4 in 2 cores. Elected EBRT+ADT x 6 mos.   HPI: Brett Mcguire is a 78 y.o. male presents for prostate cancer follow up.  Completed EBRT 12/14/2022. 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 Arlester Ladd and Dr. Alva Jewels   Severe aortic stenosis     Surgical History: Past Surgical History:  Procedure Laterality Date   COLONOSCOPY  02/18/2000   Polyp, diverticulosis, repeat in 5 years   COLONOSCOPY  03/03/2006   Repeat  -  Divertics   COLONOSCOPY WITH PROPOFOL  N/A  12/22/2017   Procedure: COLONOSCOPY WITH PROPOFOL ;  Surgeon: Irby Mannan, MD;  Location: [facility] ENDOSCOPY;  Service: Endoscopy;  Laterality: N/A;   COLONOSCOPY WITH PROPOFOL  N/A 01/22/2021   Procedure: COLONOSCOPY WITH PROPOFOL ;  Surgeon: Luke Salaam, MD;  Location: [facility] ENDOSCOPY;  Service: Gastroenterology;  Laterality: N/A;   CYSTOSCOPY WITH INSERTION OF UROLIFT     Laminectomy/Discectomy  11/03/2005   L5/S1   Malignant melanoma excision  06/20/2003   Dr. Bary Likes   MRI  10/09/2005   Lumbar spine, bulging disc L5/S1   Right lobectomy  1989   Lung cancer   RIGHT/LEFT HEART CATH AND CORONARY ANGIOGRAPHY Bilateral 09/06/2019   Procedure: RIGHT/LEFT HEART CATH AND CORONARY ANGIOGRAPHY;  Surgeon: Devorah Fonder, MD;  Location: [facility] INVASIVE CV LAB;  Service: Cardiovascular;  Laterality: Bilateral;   TEE WITHOUT CARDIOVERSION N/A 10/02/2019   Procedure: TRANSESOPHAGEAL ECHOCARDIOGRAM (TEE);  Surgeon: Arnoldo Lapping, MD;  [facility]: [facility] OR;  Service: Open Heart Surgery;  Laterality: N/A;   TONSILLECTOMY AND ADENOIDECTOMY     TRANSCATHETER AORTIC VALVE REPLACEMENT, TRANSFEMORAL  10/02/2019   Transcatheter Aortic Valve Replacement - Percutaneous Left Transfemoral Approach   TRANSCATHETER AORTIC VALVE REPLACEMENT, TRANSFEMORAL N/A 10/02/2019   Procedure: TRANSCATHETER AORTIC VALVE REPLACEMENT, TRANSFEMORAL USING EDWARDS SAPIEN 3 ULTRA 26 MM THV.;  Surgeon: Arnoldo Lapping, MD;  Location: [facility] OR;  Service: Open Heart Surgery;  Laterality:  N/A;    Home Medications:  Allergies as of 07/01/2023       Reactions   Atorvastatin     Aches at 40mg  a day.    Ephedrine Other (See Comments)   BP spikes Other reaction(s): Other (See Comments) BP spikes   Finasteride Diarrhea   diarrhea   Losartan     Cough.  Improved off med.     Spironolactone     Urinary frequency/nocturia.    Sulfa Antibiotics    Other reaction(s): Other (See Comments) REACTION: as child   Sulfonamide Derivatives Other (See  Comments)   REACTION: as child   Tadalafil Other (See Comments)   heartburn Other reaction(s): Other (See Comments) heartburn   Alfuzosin Rash, Other (See Comments)   Skin rash, burning sensation of skin Other reaction(s): Other (See Comments) Skin rash, burning sensation of skin   Doxazosin Rash, Other (See Comments)   Skin rash, burning sensation of skin Other reaction(s): Other (See Comments) Skin rash, burning sensation of skin   Flomax [tamsulosin Hcl] Rash   Rash   Rapaflo [silodosin] Rash   rash        Medication List        Accurate as of July 01, 2023  9:14 AM. If you have any questions, ask your nurse or doctor.          amLODipine  5 MG tablet Commonly known as: NORVASC  Take 1 tablet (5 mg total) by mouth daily.   amoxicillin 500 MG capsule Commonly known as: AMOXIL Take 2,000 mg by mouth as directed. Take 2,000 mg one hour prior to all dental visits.   aspirin  81 MG chewable tablet Chew 1 tablet (81 mg total) by mouth daily.   atorvastatin  20 MG tablet Commonly known as: LIPITOR Take 1 tablet (20 mg total) by mouth every Monday, Wednesday, and Friday.   eucerin cream Apply 1 application topically daily as needed for dry skin.   folic acid  1 MG tablet Commonly known as: FOLVITE  Take 1 mg by mouth See admin instructions. Take 1 tablet (1 mg) by mouth on 6 days in the week, do NOT take on Mondays.   methotrexate 2.5 MG tablet Commonly known as: RHEUMATREX Take 10 mg by mouth every Monday.   phenazopyridine  200 MG tablet Commonly known as: Pyridium  Take 1 tablet (200 mg total) by mouth 2 (two) times daily as needed for pain (don't use for more than 2 days in a row.).   Systane Ultra 0.4-0.3 % Soln Generic drug: Polyethyl Glycol-Propyl Glycol Place 1 drop into both eyes daily as needed (dry/irritated eyes.).        Allergies:  Allergies  Allergen Reactions   Atorvastatin      Aches at 40mg  a day.    Ephedrine Other (See Comments)    BP  spikes  Other reaction(s): Other (See Comments) BP spikes   Finasteride Diarrhea    diarrhea   Losartan      Cough.  Improved off med.     Spironolactone      Urinary frequency/nocturia.    Sulfa Antibiotics     Other reaction(s): Other (See Comments) REACTION: as child   Sulfonamide Derivatives Other (See Comments)    REACTION: as child   Tadalafil Other (See Comments)    heartburn Other reaction(s): Other (See Comments) heartburn   Alfuzosin Rash and Other (See Comments)    Skin rash, burning sensation of skin Other reaction(s): Other (See Comments) Skin rash, burning sensation of skin   Doxazosin Rash and Other (See  Comments)    Skin rash, burning sensation of skin Other reaction(s): Other (See Comments) Skin rash, burning sensation of skin   Flomax [Tamsulosin Hcl] Rash    Rash   Rapaflo [Silodosin] Rash    rash    Family History: Family History  Problem Relation Age of Onset   Cancer Mother        Maben throat   Alcohol abuse Mother    Cancer Father        4 kinds; 5 places, prostate and lung   Prostate cancer Father        possible prostate cancer   Cancer Other        Skin, deceased in 1994/07/11   Colon cancer Neg Hx        none known    Social History:  reports that he quit smoking about 41 years ago. His smoking use included cigarettes. He started smoking about 58 years ago. He has a 34 pack-year smoking history. He has never used smokeless tobacco. He reports that he does not drink alcohol and does not use drugs.   Physical Exam: BP (!) 140/91   Pulse 86   Ht 5' 8 (1.727 m)   Wt 161 lb (73 kg)   BMI 24.48 kg/m   Constitutional:  Alert and oriented, No acute distress. HEENT: [place] AT Respiratory: Normal respiratory effort, no increased work of breathing. Psychiatric: Normal mood and affect.   Assessment & Plan:    1. T1c intermediate unfavorable risk prostate cancer s/p EBRT + ADT x 6 months.  Storage related voiding symptoms and nocturia.  Trial  Gemtesa 75 mg daily x 4 weeks; samples given.  He will call back regarding efficacy If no improvement in nocturia we discussed possibility of sleep apnea and would recommend sleep study Schedule 56-month follow-up with PSA; call earlier for worsening/persistent voiding symptoms.  Consider cystoscopy for persistent daytime symptoms and dysuria  2.  Lower urinary tract symptoms/nocturia As above    Geraline Knapp, MD  [facility] [address], [address] [place], [place] [address] [phone]

## 2023-07-13 ENCOUNTER — Encounter: Payer: Self-pay | Admitting: Student

## 2023-07-13 ENCOUNTER — Ambulatory Visit: Attending: Student | Admitting: Student

## 2023-07-13 VITALS — BP 130/50 | HR 63 | Ht 68.0 in | Wt 169.2 lb

## 2023-07-13 DIAGNOSIS — E782 Mixed hyperlipidemia: Secondary | ICD-10-CM | POA: Insufficient documentation

## 2023-07-13 DIAGNOSIS — I10 Essential (primary) hypertension: Secondary | ICD-10-CM | POA: Insufficient documentation

## 2023-07-13 DIAGNOSIS — Z952 Presence of prosthetic heart valve: Secondary | ICD-10-CM | POA: Diagnosis not present

## 2023-07-13 DIAGNOSIS — I350 Nonrheumatic aortic (valve) stenosis: Secondary | ICD-10-CM | POA: Insufficient documentation

## 2023-07-13 NOTE — Patient Instructions (Signed)
 Medication Instructions:  Your physician recommends that you continue on your current medications as directed. Please refer to the Current Medication list given to you today.   *If you need a refill on your cardiac medications before your next appointment, please call your pharmacy*  Lab Work: None ordered at this time  If you have labs (blood work) drawn today and your tests are completely normal, you will receive your results only by: MyChart Message (if you have MyChart) OR A paper copy in the mail If you have any lab test that is abnormal or we need to change your treatment, we will call you to review the results.  Testing/Procedures: Your physician has requested that you have an echocardiogram. Echocardiography is a painless test that uses sound waves to create images of your heart. It provides your doctor with information about the size and shape of your heart and how well your heart's chambers and valves are working.   You may receive an ultrasound enhancing agent through an IV if needed to better visualize your heart during the echo. This procedure takes approximately one hour.  There are no restrictions for this procedure.  This will take place at [address] [facility]) [address], [place] [address]  Please note: We ask at that you not bring children with you during ultrasound (echo/ vascular) testing. Due to room size and safety concerns, children are not allowed in the ultrasound rooms during exams. Our front office staff cannot provide observation of children in our lobby area while testing is being conducted. An adult accompanying a patient to their appointment will only be allowed in the ultrasound room at the discretion of the ultrasound technician under special circumstances. We apologize for any inconvenience.   Follow-Up: At [facility], you and your health needs are our priority.  As part of our continuing mission to provide you with exceptional heart  care, our providers are all part of one team.  This team includes your primary Cardiologist (physician) and Advanced Practice Providers or APPs (Physician Assistants and Nurse Practitioners) who all work together to provide you with the care you need, when you need it.  Your next appointment:   1 year(s)  Provider:   Evalene Lunger, MD or Barnie Hila, NP    We recommend signing up for the patient portal called MyChart.  Sign up information is provided on this After Visit Summary.  MyChart is used to connect with patients for Virtual Visits (Telemedicine).  Patients are able to view lab/test results, encounter notes, upcoming appointments, etc.  Non-urgent messages can be sent to your provider as well.   To learn more about what you can do with MyChart, go to [email or web address].

## 2023-07-13 NOTE — Progress Notes (Signed)
 Cardiology Clinic Note   Date: 07/13/2023 ID: [name], DOB 1945-11-15, MRN [id number]  Primary Cardiologist:  Evalene Lunger, MD  Chief Complaint   Brett Mcguire is a 78 y.o. male who presents to the clinic today for overdue follow up.   Patient Profile   Brett Mcguire is followed by Dr. Gollan for the history outlined below.      Past medical history significant for: Aortic stenosis. TAVR 10/02/2019. Echo 06/23/2022: EF 60 to 65%.  No RWMA.  Mild LVH.  Grade I DD.  Normal global strain.  Normal RV size/function.  Mild MR. SAPIEN prosthetic TAVR valve present in the aortic position with normal structure and function. Carotid artery stenosis. Carotid ultrasound 11/08/2019: Bilateral carotid artery system <50% stenosis secondary to mild atherosclerotic plaque formation. Hypertension. Hyperlipidemia. Lipid panel 04/01/2023: LDL 69, HDL 50, TG 165, total 152. TIA. Lung cancer. S/p resection right upper lobe 1989.   In summary, patient was first evaluated by Dr. Gollan on 02/18/2015 for heart murmur.  Echo from December 2016 showed EF 55 to 60%, possibly bicuspid aortic valve, moderately thickened, mildly calcified leaflets, moderate stenosis.  He denied any change in his chronic dyspnea that he related to prior lung cancer with resection.  In July 2021 patient complained of increasing DOE.  Repeat echo demonstrated EF 60 to 65%, Grade I DD, mean aortic gradient 41 mmHg indicative of severe aortic stenosis.  [facility] August 2021 demonstrated no significant CAD and severe aortic stenosis gradient likely 50 to 60 mmHg.  He was referred to the structural heart team where he underwent workup with subsequent TAVR on 10/02/2019.  Patient underwent hospital admission from 11/08/2019 to 11/09/2019 for TIA.  Echo demonstrated normal LV function, normally functioning TAVR with mean gradient 9 mmHg, negative bubble study.  Patient was last seen in the office by Dr. Gollan on 09/28/2021 for routine  follow-up.  He was doing well at that time with stable BP.  Repeat echo June 2024 showed normal LV/RV function with normal structure and function of the TAVR.     History of Present Illness    Today, patient is doing well. Patient denies lower extremity edema, orthopnea or PND. He has baseline shortness of breath as well as dyspnea with heavier exertion. He feels in the last 1-2 years the dyspnea has been slightly worse but does resolve quickly with rest. He does not get dyspneic with routine activities, walking on a flat surface or navigating the stairs in his home.  No chest pain, pressure, or tightness. No palpitations. 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 No lightheadedness, dizziness, presyncope or syncope.     ROS: All other systems reviewed and are otherwise negative except as noted in History of Present Illness.  EKGs/Labs Reviewed    EKG Interpretation Date/Time:  Wednesday July 13 2023 14:51:04 EDT Ventricular Rate:  63 PR Interval:  162 QRS Duration:  100 QT Interval:  414 QTC Calculation: 423 R Axis:   55  Text Interpretation: Normal sinus rhythm Possible Left atrial enlargement When compared with ECG of 08-Nov-2019 13:58, No significant change was found Confirmed by Loistine Sober [phone]) on 07/13/2023 2:54:20 PM   04/01/2023: ALT 17; AST 21; BUN 20; Creatinine, Ser 0.83; Potassium 4.1; Sodium 139   04/28/2023: Hemoglobin 12.4; WBC Count 3.9   04/01/2023: TSH 1.91    Physical Exam    VS:  BP (!) 130/50 (BP Location: Left Arm, Patient Position: Sitting, Cuff Size: Normal)   Pulse  63   Ht 5' 8 (1.727 m)   Wt 169 lb 3.2 oz (76.7 kg)   SpO2 98%   BMI 25.73 kg/m  , BMI Body mass index is 25.73 kg/m.  GEN: Well nourished, well developed, in no acute distress. Neck: No JVD or carotid bruits. Cardiac:  RRR. No murmur. No rubs or gallops.   Respiratory:  Respirations regular and unlabored. Clear to auscultation without rales, wheezing or rhonchi. GI:  Soft, nontender, nondistended. Extremities: Radials/DP/PT 2+ and equal bilaterally. No clubbing or cyanosis. No edema.  Skin: Warm and dry, no rash. Neuro: Strength intact.  Assessment & Plan   Aortic stenosis S/p TAVR 10/02/2019.  Echo in June 2024 showed normal LV/RV function, mild LVH, Grade I DD, normal global strain, mild MR, normal structure and function of TAVR.  Patient denies lightheadedness, dizziness, presyncope or syncope. He reports dyspnea with heavier exertion but none with routine activities. RRR  without murmur on exam today.  - Repeat echo.  Hypertension BP today 130/50. No report of dizziness or headaches.  - Continue amlodipine .  Hyperlipidemia LDL 69 March 2025, at goal. - Continue atorvastatin .  Disposition: Echo. Return in 1 year or sooner as needed.          Signed, Barnie HERO. Clarisse Rodriges, DNP, NP-C

## 2023-08-09 ENCOUNTER — Ambulatory Visit: Attending: Student

## 2023-08-09 DIAGNOSIS — Z952 Presence of prosthetic heart valve: Secondary | ICD-10-CM | POA: Diagnosis not present

## 2023-08-10 ENCOUNTER — Ambulatory Visit: Payer: Self-pay | Admitting: Student

## 2023-08-10 LAB — ECHOCARDIOGRAM COMPLETE
AV Mean grad: 13 mmHg
AV Peak grad: 23.8 mmHg
Ao pk vel: 2.44 m/s
Area-P 1/2: 2.45 cm2
S' Lateral: 3.24 cm

## 2023-08-10 NOTE — Progress Notes (Signed)
 Patient called.  Left a detailed message relaying results of the most recent ECHO as interpreted by Barnie Hila, NP.  Phone number left for call back.  Please let patient know echo shows normal pumping function. Aortic valve prosthesis is in the appropriate position with no backflow of blood or narrowing. There is mild backflow of blood through the mitral valve. Good result!   Thank you!   DW

## 2023-08-11 DIAGNOSIS — D2262 Melanocytic nevi of left upper limb, including shoulder: Secondary | ICD-10-CM | POA: Diagnosis not present

## 2023-08-11 DIAGNOSIS — Z08 Encounter for follow-up examination after completed treatment for malignant neoplasm: Secondary | ICD-10-CM | POA: Diagnosis not present

## 2023-08-11 DIAGNOSIS — D2271 Melanocytic nevi of right lower limb, including hip: Secondary | ICD-10-CM | POA: Diagnosis not present

## 2023-08-11 DIAGNOSIS — Z85820 Personal history of malignant melanoma of skin: Secondary | ICD-10-CM | POA: Diagnosis not present

## 2023-08-11 DIAGNOSIS — D225 Melanocytic nevi of trunk: Secondary | ICD-10-CM | POA: Diagnosis not present

## 2023-08-11 DIAGNOSIS — Z79899 Other long term (current) drug therapy: Secondary | ICD-10-CM | POA: Diagnosis not present

## 2023-08-11 DIAGNOSIS — D2261 Melanocytic nevi of right upper limb, including shoulder: Secondary | ICD-10-CM | POA: Diagnosis not present

## 2023-08-11 DIAGNOSIS — Z85828 Personal history of other malignant neoplasm of skin: Secondary | ICD-10-CM | POA: Diagnosis not present

## 2023-08-11 DIAGNOSIS — L2089 Other atopic dermatitis: Secondary | ICD-10-CM | POA: Diagnosis not present

## 2023-08-11 DIAGNOSIS — D2272 Melanocytic nevi of left lower limb, including hip: Secondary | ICD-10-CM | POA: Diagnosis not present

## 2023-08-11 DIAGNOSIS — L821 Other seborrheic keratosis: Secondary | ICD-10-CM | POA: Diagnosis not present

## 2023-08-17 DIAGNOSIS — Z79899 Other long term (current) drug therapy: Secondary | ICD-10-CM | POA: Diagnosis not present

## 2023-10-20 DIAGNOSIS — H25813 Combined forms of age-related cataract, bilateral: Secondary | ICD-10-CM | POA: Diagnosis not present

## 2023-11-03 ENCOUNTER — Inpatient Hospital Stay: Attending: Radiation Oncology

## 2023-11-03 DIAGNOSIS — C61 Malignant neoplasm of prostate: Secondary | ICD-10-CM | POA: Insufficient documentation

## 2023-11-03 LAB — PSA: Prostatic Specific Antigen: 0.02 ng/mL (ref 0.00–4.00)

## 2023-11-10 ENCOUNTER — Encounter: Payer: Self-pay | Admitting: Radiation Oncology

## 2023-11-10 ENCOUNTER — Ambulatory Visit
Admission: RE | Admit: 2023-11-10 | Discharge: 2023-11-10 | Disposition: A | Discharge status: Home / Self Care | Source: Ambulatory Visit | Attending: Radiation Oncology | Admitting: Radiation Oncology

## 2023-11-10 ENCOUNTER — Other Ambulatory Visit: Payer: Self-pay | Admitting: *Deleted

## 2023-11-10 VITALS — BP 125/72 | HR 80 | Temp 97.5°F | Resp 16 | Wt 173.0 lb

## 2023-11-10 DIAGNOSIS — C61 Malignant neoplasm of prostate: Secondary | ICD-10-CM

## 2023-11-10 DIAGNOSIS — Z923 Personal history of irradiation: Secondary | ICD-10-CM | POA: Diagnosis not present

## 2023-11-10 DIAGNOSIS — R351 Nocturia: Secondary | ICD-10-CM | POA: Insufficient documentation

## 2023-11-10 NOTE — Progress Notes (Signed)
 Radiation Oncology Follow up Note  Name: Brett Mcguire   Date:   11/10/2023 MRN:  [id number] DOB: 07/31/1945    This 78 y.o. 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  REFERRING PROVIDER: Cleatus Arlyss RAMAN, MD  HPI: Patient is a 78 year old male now at 10 months having completed IMRT radiation therapy to his prostate for Gleason 7 adenocarcinoma seen today in routine follow-up he is doing well he still has nocturia x 4-7.  He has tried numerous medications such as Flomax although he claims he is allergic to them.  His most recent PSA is 0.02.  COMPLICATIONS OF TREATMENT: none  FOLLOW UP COMPLIANCE: keeps appointments   PHYSICAL EXAM:  BP 125/72   Pulse 80   Temp (!) 97.5 F (36.4 C) (Tympanic)   Resp 16   Wt 173 lb (78.5 kg)   BMI 26.30 kg/m  Well-developed well-nourished patient in NAD. HEENT reveals PERLA, EOMI, discs not visualized.  Oral cavity is clear. No oral mucosal lesions are identified. Neck is clear without evidence of cervical or supraclavicular adenopathy. Lungs are clear to A&P. Cardiac examination is essentially unremarkable with regular rate and rhythm without murmur rub or thrill. Abdomen is benign with no organomegaly or masses noted. Motor sensory and DTR levels are equal and symmetric in the upper and lower extremities. Cranial nerves II through XII are grossly intact. Proprioception is intact. No peripheral adenopathy or edema is identified. No motor or sensory levels are noted. Crude visual fields are within normal range.  RADIOLOGY RESULTS: No current films for review  PLAN: At the present time patient is doing well.  He continues close follow-up care with urology.  I have suggested maybe medication to help with some of his bladder sensitivity.  He will address this with  urology.  I have asked to see him back in 6 months with a follow-up PSA.  Patient knows to call with any concerns.  I would like to take this opportunity to thank you for allowing me to participate in the care of your patient.SABRA [name], MD

## 2023-11-23 ENCOUNTER — Telehealth: Payer: Self-pay

## 2023-11-23 DIAGNOSIS — J189 Pneumonia, unspecified organism: Secondary | ICD-10-CM | POA: Diagnosis not present

## 2023-11-23 DIAGNOSIS — R051 Acute cough: Secondary | ICD-10-CM | POA: Diagnosis not present

## 2023-11-23 DIAGNOSIS — R0981 Nasal congestion: Secondary | ICD-10-CM | POA: Diagnosis not present

## 2023-11-23 NOTE — Telephone Encounter (Signed)
 Reasonable to keep the appointment as scheduled on the 20th, but if he is feeling worse in the meantime then I want him to get rechecked.  Thanks.

## 2023-11-23 NOTE — Telephone Encounter (Signed)
 Copied from CRM [id number]. Topic: Clinical - Request for Lab/Test Order >> Nov 23, 2023  2:10 PM Paige D wrote: Reason for CRM: Pt calling in regards to going to immediate care states he was diagnosed with an pneumonia, and was told to follow up with pcp in about a week for another chest xray. Please reach out to pt as he is scheduled for er follow up with pcp 11/20

## 2023-11-24 NOTE — Telephone Encounter (Signed)
 Patient notified of Dr. Elfredia response below. Patient is feeling better and will keep appt on the 20th.

## 2023-12-01 ENCOUNTER — Encounter: Payer: Self-pay | Admitting: Family Medicine

## 2023-12-01 ENCOUNTER — Ambulatory Visit (INDEPENDENT_AMBULATORY_CARE_PROVIDER_SITE_OTHER): Admitting: Family Medicine

## 2023-12-01 VITALS — BP 120/70 | HR 67 | Temp 98.8°F | Ht 68.0 in | Wt 172.1 lb

## 2023-12-01 DIAGNOSIS — R059 Cough, unspecified: Secondary | ICD-10-CM

## 2023-12-01 NOTE — Patient Instructions (Addendum)
 Since you are doing better, I would get the xray in about 1 month.   Please call for an xray a few days ahead of time.  Update me as needed.  Take care.  Glad to see you. Finish the augmentin.  Hold aspirin  for about 3 days.

## 2023-12-01 NOTE — Progress Notes (Signed)
 He had been coughing, was more SOB.  Seen at [facility].   XR chest 2 views Narrative 2 views of the chest:  Prior surgical changes. Nodular and consolidative density right perihilar region. Questionable small nodular density right midlung. Scarring and volume loss right lung. Superimposed airspace disease not excluded. Recommend comparison with prior studies for initial further evaluation including to determine the need for CT. Bronchial thickening also noted which can be seen with Bronchitis or Bronchiolitis. TAVR. Heart size normal. Blunting of right costophrenic angles could be pleural thickening and/or small pleural effusion  -------------------------  Done with zithromax but still taking augmentin.  No fevers or chills.  He had bloody rhinorrhea this AM but that resolved.  He skipped aspirin  today because of that.  D/w pt about holding aspirin  for about 2-3 days. He feels better overall.  No fevers.  No sputum now.  Still with some cough but better than prior.  No vomiting.    Mood d/w pt.  He had difficulty with getting his driver's license renewed but he eventually got that addressed.  Mood improved in the meantime.  Meds, vitals, and allergies reviewed.   ROS: Per HPI unless specifically indicated in ROS section   Nad Ncat TM wnl B Small punctate superficial amount of dried blood on the medial L nostril.  No active bleeding. MMM Neck supple, no LA Rrr Ctab Skin well-perfused.

## 2023-12-04 NOTE — Assessment & Plan Note (Signed)
 Discussed options.  He is clinically improved.  Lungs are clear.  Okay for outpatient follow-up.  Discussed potential timing of follow-up x-ray. Since he is feeling better, I would get the xray in about 1 month.   I asked him to please call for an xray a few days ahead of time.  Update me as needed.  Finish the augmentin.  Hold aspirin  for about 3 days.

## 2023-12-13 DIAGNOSIS — R051 Acute cough: Secondary | ICD-10-CM | POA: Diagnosis not present

## 2023-12-13 DIAGNOSIS — J189 Pneumonia, unspecified organism: Secondary | ICD-10-CM | POA: Diagnosis not present

## 2023-12-16 ENCOUNTER — Ambulatory Visit: Admitting: Family Medicine

## 2023-12-16 VITALS — BP 124/60 | HR 67 | Temp 98.2°F | Ht 68.0 in | Wt 175.5 lb

## 2023-12-16 DIAGNOSIS — R9389 Abnormal findings on diagnostic imaging of other specified body structures: Secondary | ICD-10-CM

## 2023-12-16 NOTE — Progress Notes (Signed)
 Cough is slowly getting better. No fevers.  Coughing up less sputum.  No blood in sputum.  No wheeze.  He recently felt a pop in his chest, R side of chest. No pain from that.  Not ttp.  No SOB.    D/w pt about most recent cxr report.   Impression 1. Postsurgical changes in the mediastinum 2. Status post TAVR procedure 3. Chronic scarring and volume loss right hemithorax 4. No airspace disease 5. No change in approximately 9 mm nodular density right mid upper lung field, consider CT  Currently treated with Levaquin .  Meds, vitals, and allergies reviewed.   ROS: Per HPI unless specifically indicated in ROS section   Nad Ncat Neck supple, no LA Rrr Ctab Abdomen soft.  Nontender. Skin well-perfused.

## 2023-12-16 NOTE — Patient Instructions (Signed)
 Finish the antibiotics and call about the CT.   Let me know if you can't get scheduled.  We'll update you when I get the results.    [facility] [address] [place],  [place]  [address]  Main: [phone]

## 2023-12-18 DIAGNOSIS — R9389 Abnormal findings on diagnostic imaging of other specified body structures: Secondary | ICD-10-CM | POA: Insufficient documentation

## 2023-12-18 NOTE — Assessment & Plan Note (Signed)
 Lungs are clear.  Finish Levaquin .  CT ordered.  Update me as needed.  He will let me know if he cannot get scheduled for CT.

## 2023-12-20 ENCOUNTER — Other Ambulatory Visit

## 2023-12-20 ENCOUNTER — Ambulatory Visit: Admission: RE | Admit: 2023-12-20 | Discharge: 2023-12-20 | Attending: Family Medicine | Admitting: Family Medicine

## 2023-12-20 DIAGNOSIS — R918 Other nonspecific abnormal finding of lung field: Secondary | ICD-10-CM | POA: Diagnosis not present

## 2023-12-20 DIAGNOSIS — R972 Elevated prostate specific antigen [PSA]: Secondary | ICD-10-CM | POA: Diagnosis not present

## 2023-12-20 DIAGNOSIS — J432 Centrilobular emphysema: Secondary | ICD-10-CM | POA: Diagnosis not present

## 2023-12-20 DIAGNOSIS — R9389 Abnormal findings on diagnostic imaging of other specified body structures: Secondary | ICD-10-CM

## 2023-12-20 DIAGNOSIS — I2510 Atherosclerotic heart disease of native coronary artery without angina pectoris: Secondary | ICD-10-CM | POA: Diagnosis not present

## 2023-12-21 ENCOUNTER — Telehealth: Payer: Self-pay

## 2023-12-21 ENCOUNTER — Ambulatory Visit: Payer: Self-pay | Admitting: Family Medicine

## 2023-12-21 LAB — PSA: Prostate Specific Ag, Serum: <0.1 ng/mL (ref 0.0–4.0)

## 2023-12-21 NOTE — Telephone Encounter (Signed)
See result note.  Thanks!

## 2023-12-21 NOTE — Telephone Encounter (Signed)
 Copied from CRM #[id number]. Topic: Clinical - Lab/Test Results >> Dec 21, 2023 12:47 PM Alfonso HERO wrote: Reason for CRM: calling for CT scan results

## 2023-12-23 ENCOUNTER — Encounter: Payer: Self-pay | Admitting: Urology

## 2023-12-23 ENCOUNTER — Ambulatory Visit: Admitting: Urology

## 2023-12-23 VITALS — BP 163/71 | HR 75 | Ht 68.0 in | Wt 166.0 lb

## 2023-12-23 DIAGNOSIS — C61 Malignant neoplasm of prostate: Secondary | ICD-10-CM

## 2023-12-23 NOTE — Progress Notes (Signed)
 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  Most recent biopsies performed in 2015, 2017 Last PSA 11/02/2021 was 10.7.  MRI November 2023 which showed a 49.5 cc gland  TRUS/ biopsy prostate 06/23/2022. PSA 11.8; prostate volume 61 grams. Pathology 6/6 left sided cores benign, 4/12 right sided cores positive for Gleason 4+3 in 2 cores  3+4 in 2 cores. Elected EBRT+ADT x 6 mos. EBRT completed 12/14/2022   HPI: Brett Mcguire is a 78 y.o. male presents for prostate cancer follow up.  States his voiding symptoms have significantly improved since his last visit.  Still has frequency but urgency has resolved and he is currently satisfied with his voiding pattern. 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 Wonda and Dr. Dusty   Severe aortic stenosis     Surgical History: Past Surgical History:  Procedure  Laterality Date   COLONOSCOPY  02/18/2000   Polyp, diverticulosis, repeat in 5 years   COLONOSCOPY  03/03/2006   Repeat  -  Divertics   COLONOSCOPY WITH PROPOFOL  N/A 12/22/2017   Procedure: COLONOSCOPY WITH PROPOFOL ;  Surgeon: Janalyn Keene NOVAK, MD;  Location: [facility] ENDOSCOPY;  Service: Endoscopy;  Laterality: N/A;   COLONOSCOPY WITH PROPOFOL  N/A 01/22/2021   Procedure: COLONOSCOPY WITH PROPOFOL ;  Surgeon: Therisa Bi, MD;  Location: [facility] ENDOSCOPY;  Service: Gastroenterology;  Laterality: N/A;   CYSTOSCOPY WITH INSERTION OF UROLIFT     Laminectomy/Discectomy  11/03/2005   L5/S1   Malignant melanoma excision  06/20/2003   Dr. Hester   MRI  10/09/2005   Lumbar spine, bulging disc L5/S1   Right lobectomy  1989   Lung cancer   RIGHT/LEFT HEART CATH AND CORONARY ANGIOGRAPHY Bilateral 09/06/2019   Procedure: RIGHT/LEFT HEART CATH AND CORONARY ANGIOGRAPHY;  Surgeon: Perla Evalene PARAS, MD;  Location: [facility] INVASIVE CV LAB;  Service: Cardiovascular;  Laterality: Bilateral;   TEE WITHOUT CARDIOVERSION N/A 10/02/2019   Procedure: TRANSESOPHAGEAL ECHOCARDIOGRAM (TEE);  Surgeon: Wonda Sharper, MD;  Location: [facility] OR;  Service: Open Heart Surgery;  Laterality: N/A;   TONSILLECTOMY AND ADENOIDECTOMY     TRANSCATHETER AORTIC VALVE REPLACEMENT, TRANSFEMORAL  10/02/2019   Transcatheter Aortic Valve Replacement - Percutaneous Left Transfemoral Approach   TRANSCATHETER  AORTIC VALVE REPLACEMENT, TRANSFEMORAL N/A 10/02/2019   Procedure: TRANSCATHETER AORTIC VALVE REPLACEMENT, TRANSFEMORAL USING EDWARDS SAPIEN 3 ULTRA 26 MM THV.;  Surgeon: Wonda Sharper, MD;  Location: [facility] OR;  Service: Open Heart Surgery;  Laterality: N/A;    Home Medications:  Allergies as of 12/23/2023       Reactions   Atorvastatin     Aches at 40mg  a day.    Ephedrine Other (See Comments)   BP spikes Other reaction(s): Other (See Comments) BP spikes   Finasteride Diarrhea   diarrhea   Losartan     Cough.  Improved off med.      Spironolactone     Urinary frequency/nocturia.    Sulfa Antibiotics    Other reaction(s): Other (See Comments) REACTION: as child   Sulfonamide Derivatives Other (See Comments)   REACTION: as child   Tadalafil Other (See Comments)   heartburn Other reaction(s): Other (See Comments) heartburn   Alfuzosin Rash, Other (See Comments)   Skin rash, burning sensation of skin Other reaction(s): Other (See Comments) Skin rash, burning sensation of skin   Doxazosin Rash, Other (See Comments)   Skin rash, burning sensation of skin Other reaction(s): Other (See Comments) Skin rash, burning sensation of skin   Flomax [tamsulosin Hcl] Rash   Rash   Rapaflo [silodosin] Rash   rash        Medication List        Accurate as of December 23, 2023 11:05 AM. If you have any questions, ask your nurse or doctor.          amLODipine  5 MG tablet Commonly known as: NORVASC  Take 1 tablet (5 mg total) by mouth daily.   aspirin  81 MG chewable tablet Chew 1 tablet (81 mg total) by mouth daily.   atorvastatin  20 MG tablet Commonly known as: LIPITOR Take 1 tablet (20 mg total) by mouth every Monday, Wednesday, and Friday.   eucerin cream Apply 1 application topically daily as needed for dry skin.   folic acid  1 MG tablet Commonly known as: FOLVITE  Take 1 mg by mouth See admin instructions. Take 1 tablet (1 mg) by mouth on 6 days in the week, do NOT take on Mondays.   methotrexate 2.5 MG tablet Commonly known as: RHEUMATREX Take 12.5 mg by mouth every Monday.   Systane Ultra 0.4-0.3 % Soln Generic drug: Polyethyl Glycol-Propyl Glycol Place 1 drop into both eyes daily as needed (dry/irritated eyes.).        Allergies:  Allergies  Allergen Reactions   Atorvastatin      Aches at 40mg  a day.    Ephedrine Other (See Comments)    BP spikes  Other reaction(s): Other (See Comments) BP spikes   Finasteride Diarrhea    diarrhea   Losartan      Cough.  Improved off med.      Spironolactone      Urinary frequency/nocturia.    Sulfa Antibiotics     Other reaction(s): Other (See Comments) REACTION: as child   Sulfonamide Derivatives Other (See Comments)    REACTION: as child   Tadalafil Other (See Comments)    heartburn Other reaction(s): Other (See Comments) heartburn   Alfuzosin Rash and Other (See Comments)    Skin rash, burning sensation of skin Other reaction(s): Other (See Comments) Skin rash, burning sensation of skin   Doxazosin Rash and Other (See Comments)    Skin rash, burning sensation of skin Other reaction(s): Other (See Comments) Skin rash, burning sensation of skin   Flomax [Tamsulosin Hcl]  Rash    Rash   Rapaflo [Silodosin] Rash    rash    Family History: Family History  Problem Relation Age of Onset   Cancer Mother        [place] throat   Alcohol abuse Mother    Cancer Father        4 kinds; 5 places, prostate and lung   Prostate cancer Father        possible prostate cancer   Cancer Other        Skin, deceased in 01/11/1995   Colon cancer Neg Hx        none known    Social History:  reports that he quit smoking about 41 years ago. His smoking use included cigarettes. He started smoking about 58 years ago. He has a 34 pack-year smoking history. He has never used smokeless tobacco. He reports that he does not drink alcohol and does not use drugs.   Physical Exam: BP (!) 163/71   Pulse 75   Ht 5' 8 (1.727 m)   Wt 166 lb (75.3 kg)   BMI 25.24 kg/m   Constitutional:  Alert and oriented, No acute distress. HEENT: [place] AT Respiratory: Normal respiratory effort, no increased work of breathing. Psychiatric: Normal mood and affect.   Assessment & Plan:    1. 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

## 2024-01-18 ENCOUNTER — Ambulatory Visit: Admitting: Urology

## 2024-02-09 ENCOUNTER — Emergency Department
Admission: EM | Admit: 2024-02-09 | Discharge: 2024-02-09 | Disposition: A | Discharge status: Home / Self Care | Attending: Emergency Medicine | Admitting: Emergency Medicine

## 2024-02-09 ENCOUNTER — Emergency Department

## 2024-02-09 ENCOUNTER — Other Ambulatory Visit: Payer: Self-pay

## 2024-02-09 DIAGNOSIS — W000XXA Fall on same level due to ice and snow, initial encounter: Secondary | ICD-10-CM | POA: Diagnosis not present

## 2024-02-09 DIAGNOSIS — S0101XA Laceration without foreign body of scalp, initial encounter: Secondary | ICD-10-CM | POA: Insufficient documentation

## 2024-02-09 DIAGNOSIS — W19XXXA Unspecified fall, initial encounter: Secondary | ICD-10-CM

## 2024-02-09 DIAGNOSIS — S0990XA Unspecified injury of head, initial encounter: Secondary | ICD-10-CM | POA: Insufficient documentation

## 2024-02-09 NOTE — ED Provider Notes (Signed)
" ° °  [facility] Provider Note    Event Date/Time   First MD Initiated Contact with Patient 02/09/24 2130     (approximate)   History   Fall   HPI  Brett Mcguire is a 79 y.o. male who presents after a fall, patient slipped on the ice and fell backwards and hit his head.  He is not on blood thinners.  Denies neurodeficits.  No change in vision.  No other injuries reported.  No neck pain.     Physical Exam   Triage Vital Signs: ED Triage Vitals  Encounter Vitals Group     BP 02/09/24 1757 (!) 173/87     Girls Systolic BP Percentile --      Girls Diastolic BP Percentile --      Boys Systolic BP Percentile --      Boys Diastolic BP Percentile --      Pulse Rate 02/09/24 1757 79     Resp 02/09/24 1757 13     Temp 02/09/24 1757 98.8 F (37.1 C)     Temp src --      SpO2 02/09/24 1757 98 %     Weight 02/09/24 1758 75.3 kg (166 lb 0.1 oz)     Height 02/09/24 1758 1.727 m (5' 8)     Head Circumference --      Peak Flow --      Pain Score 02/09/24 1758 1     Pain Loc --      Pain Education --      Exclude from Growth Chart --     Most recent vital signs: Vitals:   02/09/24 1757 02/09/24 2214  BP: (!) 173/87 (!) 153/84  Pulse: 79 74  Resp: 13 16  Temp: 98.8 F (37.1 C)   SpO2: 98% 99%     General: Awake, no distress.  CV:  Good peripheral perfusion.  Resp:  Normal effort.  Abd:  No distention.  Other:  Small hematoma to the scalp, bleeding controlled, no repairable wound Normal neurologic exam, ambulating well, PERRLA, EOMI   ED Results / Procedures / Treatments   Labs (all labs ordered are listed, but only abnormal results are displayed) Labs Reviewed - No data to display   EKG     RADIOLOGY CT head viewed interpreted by me, no ICH, confirmed by radiology    PROCEDURES:  Critical Care performed:   Procedures   MEDICATIONS ORDERED IN ED: Medications - No data to display   IMPRESSION / MDM / ASSESSMENT AND PLAN /  ED COURSE  I reviewed the triage vital signs and the nursing notes. Patient's presentation is most consistent with acute presentation with potential threat to life or bodily function.  Patient presents with slip and fall with head injury possible laceration  Neurologic exam is reassuring, CT head is without evidence of skull fracture or ICH  Wound cleaned, no indication for staples or suturing, recommended dressing however patient declined        FINAL CLINICAL IMPRESSION(S) / ED DIAGNOSES   Final diagnoses:  Fall, initial encounter  Injury of head, initial encounter  Laceration of scalp, initial encounter     Rx / DC Orders   ED Discharge Orders     None        Note:  This document was prepared using Dragon voice recognition software and may include unintentional dictation errors.   Arlander Charleston, MD 02/09/24 2230  "

## 2024-02-09 NOTE — ED Triage Notes (Signed)
 Pt comes in via pov after slipping and falling on ice today. Pt states that he hit the back of his head, and denies LOC. Pt is not on any blood thinners. Pt with a laceration the back of his head. Pt complains of pain 1/10 at this time. Pt ambulatory in triage with no signs of acute distress at this time.

## 2024-03-30 ENCOUNTER — Ambulatory Visit

## 2024-04-02 ENCOUNTER — Ambulatory Visit

## 2024-05-01 ENCOUNTER — Inpatient Hospital Stay

## 2024-05-08 ENCOUNTER — Ambulatory Visit: Admitting: Radiation Oncology

## 2024-10-18 ENCOUNTER — Other Ambulatory Visit

## 2024-10-22 ENCOUNTER — Ambulatory Visit: Admitting: Urology
